# Patient Record
Sex: Female | Born: 1935 | Race: White | Hispanic: No | Smoking: Former smoker
Health system: Southern US, Community
[De-identification: ages and names within clinical notes are randomized; demographics above are authoritative.]

## PROBLEM LIST (undated history)

## (undated) ENCOUNTER — Ambulatory Visit: Admission: EM | Source: Home / Self Care

## (undated) DIAGNOSIS — M1990 Unspecified osteoarthritis, unspecified site: Secondary | ICD-10-CM

## (undated) DIAGNOSIS — R0602 Shortness of breath: Secondary | ICD-10-CM

## (undated) DIAGNOSIS — H9190 Unspecified hearing loss, unspecified ear: Secondary | ICD-10-CM

## (undated) DIAGNOSIS — R112 Nausea with vomiting, unspecified: Secondary | ICD-10-CM

## (undated) DIAGNOSIS — N289 Disorder of kidney and ureter, unspecified: Secondary | ICD-10-CM

## (undated) DIAGNOSIS — C50912 Malignant neoplasm of unspecified site of left female breast: Principal | ICD-10-CM

## (undated) DIAGNOSIS — I10 Essential (primary) hypertension: Secondary | ICD-10-CM

## (undated) DIAGNOSIS — K219 Gastro-esophageal reflux disease without esophagitis: Secondary | ICD-10-CM

## (undated) DIAGNOSIS — Z923 Personal history of irradiation: Secondary | ICD-10-CM

## (undated) DIAGNOSIS — Z973 Presence of spectacles and contact lenses: Secondary | ICD-10-CM

## (undated) DIAGNOSIS — E785 Hyperlipidemia, unspecified: Secondary | ICD-10-CM

## (undated) DIAGNOSIS — K5792 Diverticulitis of intestine, part unspecified, without perforation or abscess without bleeding: Secondary | ICD-10-CM

## (undated) DIAGNOSIS — K08109 Complete loss of teeth, unspecified cause, unspecified class: Secondary | ICD-10-CM

## (undated) DIAGNOSIS — IMO0001 Reserved for inherently not codable concepts without codable children: Secondary | ICD-10-CM

## (undated) DIAGNOSIS — T7840XA Allergy, unspecified, initial encounter: Secondary | ICD-10-CM

## (undated) DIAGNOSIS — Z972 Presence of dental prosthetic device (complete) (partial): Secondary | ICD-10-CM

## (undated) DIAGNOSIS — M1712 Unilateral primary osteoarthritis, left knee: Secondary | ICD-10-CM

## (undated) DIAGNOSIS — Z9889 Other specified postprocedural states: Secondary | ICD-10-CM

## (undated) HISTORY — PX: TONSILLECTOMY: SUR1361

## (undated) HISTORY — DX: Disorder of kidney and ureter, unspecified: N28.9

## (undated) HISTORY — DX: Malignant neoplasm of unspecified site of left female breast: C50.912

## (undated) HISTORY — PX: BLADDER SUSPENSION: SHX72

## (undated) HISTORY — PX: TUBAL LIGATION: SHX77

## (undated) HISTORY — PX: FRACTURE SURGERY: SHX138

## (undated) HISTORY — PX: CHOLECYSTECTOMY: SHX55

## (undated) HISTORY — DX: Personal history of irradiation: Z92.3

## (undated) HISTORY — PX: EYE SURGERY: SHX253

## (undated) HISTORY — DX: Allergy, unspecified, initial encounter: T78.40XA

---

## 1991-05-04 HISTORY — PX: ABDOMINAL HYSTERECTOMY: SHX81

## 1998-05-03 HISTORY — PX: KNEE SURGERY: SHX244

## 1998-05-22 ENCOUNTER — Encounter: Payer: Self-pay | Admitting: Emergency Medicine

## 1998-05-22 ENCOUNTER — Encounter: Payer: Self-pay | Admitting: Orthopedic Surgery

## 1998-05-22 ENCOUNTER — Inpatient Hospital Stay (HOSPITAL_COMMUNITY): Admission: EM | Admit: 1998-05-22 | Discharge: 1998-05-24 | Payer: Self-pay | Admitting: Emergency Medicine

## 1999-01-19 ENCOUNTER — Encounter: Payer: Self-pay | Admitting: Orthopedic Surgery

## 1999-01-20 ENCOUNTER — Encounter: Payer: Self-pay | Admitting: Orthopedic Surgery

## 1999-01-20 ENCOUNTER — Ambulatory Visit (HOSPITAL_COMMUNITY): Admission: RE | Admit: 1999-01-20 | Discharge: 1999-01-20 | Payer: Self-pay | Admitting: Orthopedic Surgery

## 1999-02-23 ENCOUNTER — Emergency Department (HOSPITAL_COMMUNITY): Admission: EM | Admit: 1999-02-23 | Discharge: 1999-02-23 | Payer: Self-pay | Admitting: Emergency Medicine

## 2000-10-31 ENCOUNTER — Other Ambulatory Visit: Admission: RE | Admit: 2000-10-31 | Discharge: 2000-10-31 | Payer: Self-pay | Admitting: Obstetrics and Gynecology

## 2000-11-07 ENCOUNTER — Ambulatory Visit (HOSPITAL_COMMUNITY): Admission: RE | Admit: 2000-11-07 | Discharge: 2000-11-07 | Payer: Self-pay | Admitting: Gastroenterology

## 2000-11-15 ENCOUNTER — Encounter: Payer: Self-pay | Admitting: Obstetrics and Gynecology

## 2000-11-15 ENCOUNTER — Ambulatory Visit (HOSPITAL_COMMUNITY): Admission: RE | Admit: 2000-11-15 | Discharge: 2000-11-15 | Payer: Self-pay | Admitting: Obstetrics and Gynecology

## 2001-09-18 ENCOUNTER — Encounter: Payer: Self-pay | Admitting: Family Medicine

## 2001-09-18 ENCOUNTER — Encounter: Admission: RE | Admit: 2001-09-18 | Discharge: 2001-09-18 | Payer: Self-pay | Admitting: Family Medicine

## 2001-11-16 ENCOUNTER — Ambulatory Visit (HOSPITAL_COMMUNITY): Admission: RE | Admit: 2001-11-16 | Discharge: 2001-11-16 | Payer: Self-pay | Admitting: Obstetrics and Gynecology

## 2001-11-16 ENCOUNTER — Encounter: Payer: Self-pay | Admitting: Obstetrics and Gynecology

## 2001-11-23 ENCOUNTER — Other Ambulatory Visit: Admission: RE | Admit: 2001-11-23 | Discharge: 2001-11-23 | Payer: Self-pay | Admitting: Obstetrics and Gynecology

## 2002-11-29 ENCOUNTER — Other Ambulatory Visit: Admission: RE | Admit: 2002-11-29 | Discharge: 2002-11-29 | Payer: Self-pay | Admitting: *Deleted

## 2002-12-11 ENCOUNTER — Encounter: Payer: Self-pay | Admitting: Family Medicine

## 2002-12-11 ENCOUNTER — Ambulatory Visit (HOSPITAL_COMMUNITY): Admission: RE | Admit: 2002-12-11 | Discharge: 2002-12-11 | Payer: Self-pay | Admitting: Family Medicine

## 2002-12-29 ENCOUNTER — Encounter: Payer: Self-pay | Admitting: Emergency Medicine

## 2002-12-29 ENCOUNTER — Emergency Department (HOSPITAL_COMMUNITY): Admission: EM | Admit: 2002-12-29 | Discharge: 2002-12-29 | Payer: Self-pay | Admitting: Emergency Medicine

## 2003-01-12 ENCOUNTER — Encounter: Payer: Self-pay | Admitting: Family Medicine

## 2003-01-12 ENCOUNTER — Ambulatory Visit (HOSPITAL_COMMUNITY): Admission: RE | Admit: 2003-01-12 | Discharge: 2003-01-12 | Payer: Self-pay | Admitting: Family Medicine

## 2003-04-10 ENCOUNTER — Ambulatory Visit (HOSPITAL_COMMUNITY): Admission: RE | Admit: 2003-04-10 | Discharge: 2003-04-10 | Payer: Self-pay | Admitting: Family Medicine

## 2003-12-17 ENCOUNTER — Ambulatory Visit (HOSPITAL_COMMUNITY): Admission: RE | Admit: 2003-12-17 | Discharge: 2003-12-17 | Payer: Self-pay | Admitting: Family Medicine

## 2003-12-18 ENCOUNTER — Other Ambulatory Visit: Admission: RE | Admit: 2003-12-18 | Discharge: 2003-12-18 | Payer: Self-pay | Admitting: Obstetrics and Gynecology

## 2004-03-12 ENCOUNTER — Ambulatory Visit (HOSPITAL_COMMUNITY): Admission: RE | Admit: 2004-03-12 | Discharge: 2004-03-12 | Payer: Self-pay | Admitting: Family Medicine

## 2004-09-22 ENCOUNTER — Encounter: Admission: RE | Admit: 2004-09-22 | Discharge: 2004-10-06 | Payer: Self-pay | Admitting: Orthopedic Surgery

## 2005-01-27 ENCOUNTER — Encounter: Admission: RE | Admit: 2005-01-27 | Discharge: 2005-01-27 | Payer: Self-pay | Admitting: Surgery

## 2005-02-09 ENCOUNTER — Ambulatory Visit (HOSPITAL_COMMUNITY): Admission: RE | Admit: 2005-02-09 | Discharge: 2005-02-09 | Payer: Self-pay | Admitting: Family Medicine

## 2005-03-10 ENCOUNTER — Encounter (INDEPENDENT_AMBULATORY_CARE_PROVIDER_SITE_OTHER): Payer: Self-pay | Admitting: Specialist

## 2005-03-10 ENCOUNTER — Ambulatory Visit (HOSPITAL_COMMUNITY): Admission: RE | Admit: 2005-03-10 | Discharge: 2005-03-10 | Payer: Self-pay | Admitting: Surgery

## 2006-02-08 ENCOUNTER — Other Ambulatory Visit: Admission: RE | Admit: 2006-02-08 | Discharge: 2006-02-08 | Payer: Self-pay | Admitting: Obstetrics and Gynecology

## 2006-02-15 ENCOUNTER — Ambulatory Visit (HOSPITAL_COMMUNITY): Admission: RE | Admit: 2006-02-15 | Discharge: 2006-02-15 | Payer: Self-pay | Admitting: *Deleted

## 2006-09-11 ENCOUNTER — Observation Stay (HOSPITAL_COMMUNITY): Admission: EM | Admit: 2006-09-11 | Discharge: 2006-09-14 | Payer: Self-pay | Admitting: Emergency Medicine

## 2007-06-01 ENCOUNTER — Ambulatory Visit (HOSPITAL_COMMUNITY): Admission: RE | Admit: 2007-06-01 | Discharge: 2007-06-01 | Payer: Self-pay | Admitting: Family Medicine

## 2010-05-24 ENCOUNTER — Encounter: Payer: Self-pay | Admitting: Family Medicine

## 2010-09-15 NOTE — H&P (Signed)
NAME:  Melissa Morton, Melissa Morton NO.:  [id number]   MEDICAL RECORD NO.:  [id number]          PATIENT TYPE:  EMS   LOCATION:  MAJO                         FACILITY:  [facility]   PHYSICIAN:  Barnetta Chapel, MDDATE OF BIRTH:  03/15/1934   DATE OF ADMISSION:  09/11/2006  DATE OF DISCHARGE:                              HISTORY & PHYSICAL   PRIMARY CARE PHYSICIAN:  __________   PRESENTING PROBLEM:  Chest pain.   HISTORY OF PRESENTING COMPLAINT:  The patient is a 77 year old female  with prior medical history, significant for hypertension, GERD, status  post cholecystectomy and diverticulosis.  According to the patient, she  woke up early this morning to use the bathroom and felt a bit  lightheaded.  She felt as if she was going to pass out.  Subsequently  she developed chest pain.  The pain involved lower rib cage area  anteriorly (beneath the breast).  The pain seems quite atypical.  There  is associated nausea and shortness of breath.  No diaphoresis.  The pain  had never had chest pain in the past.  She does not have a cardiologist.  Family history of myocardial infarction.  She does not smoke cigarettes.  She has a history of dyslipidemia.   PAST MEDICAL HISTORY:  Includes:  1. Hypertension.  2. Dyslipidemia.  3. Gastroesophageal reflux disease.  4. Diverticulosis.  5. Status post laparoscopic cholecystectomy.   MEDICATIONS PRIOR TO ADMISSION:  1. Cardizem.  2. Fosamax 70 mg once weekly (every Tuesday).  3. Lipitor 20 mg p.o. once daily.  4. Prevacid 30 mg p.o. once daily.   ALLERGIES:  None.   SOCIAL HISTORY:  The patient is widowed.  The husband died 12 years ago.  She has 6 children.  She lives with one of the daughters.  The patient  does not smoke cigarettes, does not drink alcohol nor use illicit drugs.   FAMILY HISTORY:  The patient's younger sister had a MI at the age of 53.  The patient's father died from heart disease when she was very young.   REVIEW OF SYSTEMS:  There is no headache, no neck pain, no fever or  chills, no sore throat, no change in bowel habits, no vomiting.  There  is no dysuria, urinary frequency or urgency.  No weight loss.  No joint  pains.   PHYSICAL EXAMINATION:  VITAL SIGNS:  The temperature is 98.6, the blood  pressure is 130/63 mmHg, the heart rate is 67 per minute and the  respiratory rate is 18.  Oxygen saturations are 93% on room air.  GENERAL:  The patient is very comfortable.  She is not in any  respiratory distress.  HEENT:  There is no pallor, no jaundice, extraocular muscles are intact.  The tongue is dry.  NECK:  Supple.  There is there no lymphadenopathy or JVD.  LUNGS:  Clear to auscultation.  CVS:  S1, S2, no heart murmur appreciated.  ABDOMEN:  Soft and nontender.  Bowel sounds are present.  Organs are  difficult to assess.  NEURO:  Nonfocal.  The patient moves all limbs.  EXTREMITIES:  No edema.   LABORATORY DATA:  Done so far, sodium is 138, potassium is 4, chloride  is 106, BUN is 27, creatinine is 1.7.  hemoglobin is 11.9.  The CK is  less than 1.  The troponin is less than 0.05.  INR is 1.  D-dimer is  less than 0.22.  The chest x-ray is read as no acute abnormalities.   IMPRESSION:  1. Chest pain.  This sounds quite atypical.  The patient has a history      of gastroesophageal reflux disease.  However, there is a positive      family history of myocardial infarction.  She also has a history of      hypertension and dyslipidemia.  She is a 77 year old lady.  Will      admit the patient.  Will get a cardiology consult.  May need to do      a cardiac stress test on this patient.  Will also get a CT scan to      rule out PE.  2. Acute renal failure, suspect acute.  The patient has a history of      high blood pressure.  I am not sure how well controlled the      patient's blood pressure has been.  She may also be mildly      dehydrated.  We will rehydrate the patient.  We will  check urine      electrolytes.  Will also get a renal ultrasound.  Further      management will depend on that.  3. History of hypertension.  This seems well controlled for now.  Will      continue Cardizem.  4. History of gastroesophageal reflux disease.  Will use Protonix      twice daily.  Further management will depend on hospital course.      Barnetta Chapel, MD  Electronically Signed     SIO/MEDQ  D:  09/11/2006  T:  09/11/2006  Job:  [id number]

## 2010-09-15 NOTE — Consult Note (Signed)
NAME:  Melissa Morton, BOCHICCHIO NO.:  [id number]   MEDICAL RECORD NO.:  [id number]          PATIENT TYPE:  INP   LOCATION:  2011                         FACILITY:  [facility]   PHYSICIAN:  Corky Crafts, MDDATE OF BIRTH:  [birth year]   DATE OF CONSULTATION:  DATE OF DISCHARGE:                                 CONSULTATION   PRIMARY CARE PHYSICIAN:  Royetta Crochet, MD   REASON FOR CONSULTATION:  Chest pain.   HISTORY OF PRESENT ILLNESS:  The patient is a 77 year old woman with  hypertension.  She woke up about at about 3:30 this morning to go the  bathroom.  She felt dizzy and her hands were tingling.  She then sat  down and became short of breath when trying to lie flat.  She felt a  tightness around her lower chest, it felt like a band.  This sensation  she has had in the past many times.  It has been occurring about one to  two times per week for many years.  She has never had any prior cardiac  evaluation.  She walks for exercise.  She has never had the band  sensation while walking.  There are no discernible triggers for this  feeling.   PAST MEDICAL HISTORY:  1. Hypertension.  2. GERD.  3. Diverticulosis.   PAST SURGICAL HISTORY:  Cholecystectomy.   ALLERGIES:  NO KNOWN DRUG ALLERGIES.   MEDICATIONS AT HOME:  1. Cardizem.  2. Fosamax.  3. Lipitor 20 mg a day.  4. Presence 30 mg a day.   SOCIAL HISTORY:  The patient does not smoke or drink.  She lives with  one of her daughters.  She is widowed and six children.   FAMILY HISTORY:  She had a sister who has congestive heart failure at  age 66.  Father died at young age.   REVIEW OF SYSTEMS:  No recent fevers or chills.  No weight loss.  No  nausea or vomiting.  No focal weakness currently.  No rash.  All other  systems negative.   PHYSICAL EXAMINATION:  VITAL SIGNS:  Blood pressure is 116/68, pulse 71.  GENERAL:  Patient is awake and alert in no apparent distress.  HEAD:  Normocephalic, atraumatic.  EYES:  Extraocular movements intact.  NECK:  No carotid bruits.  CARDIOVASCULAR:  Regular rate and rhythm.  S1, S2.  LUNGS:  Clear to auscultation bilaterally.  ABDOMEN:  Soft, nontender, and nondistended.  EXTREMITIES:  Showed no edema.  NEURO:  No focal deficits.  SKIN:  No rash.  PSYCH:  Normal mood and affect.   EKG shows normal sinus rhythm with no ST-T wave changes.  There are  occasional PVCs.  Lab work shows hematocrit of 34.0, creatinine 1.45  which is down from 1.7.   ASSESSMENT/PLAN:  A 77 year old with chest pain and several risk factors  for heart disease.   PLAN:  1. Cardiac.  We will out for MI with enzymes.  So far, her troponin      has been negative.  2. She will also be worked up for her  elevated creatinine and possibly      for a PE.  3. Continue Cardizem for blood pressure control.  Blood pressure is      currently well-controlled.  4. We will try to obtain a prior outpatient fasting lipid profile.  5. The patient will likely need a stress test since she will be worked      up for other issues, such as her renal failure and possibly a PE.      I will consider doing this as an outpatient if she rules out for      MI.      Corky Crafts, MD  Electronically Signed     JSV/MEDQ  D:  09/11/2006  T:  09/12/2006  Job:  [id number]

## 2010-09-18 NOTE — Procedures (Signed)
[place]  Patient:    Melissa Morton, Melissa Morton                      MRN: [id number] Proc. Date: 11/07/00 Adm. Date:  [id number] Attending:  Nelda Marseille CC:         Meredith Staggers, M.D.   Procedure Report  PROCEDURE PERFORMED:  Colonoscopy with biopsy.  ENDOSCOPIST:  Petra Kuba, M.D.  INDICATIONS FOR PROCEDURE:  Patient with a history of colon polyps due for repeat screening.  Consent was signed after risks, benefits, methods, and options were thoroughly discussed multiple times in the past.  MEDICATIONS USED:  Demerol 50 mg, Versed 6 mg.  DESCRIPTION OF PROCEDURE:  Rectal inspection was pertinent for external hemorrhoids.  Digital exam was negative.  Video pediatric colonoscope was inserted and with moderate difficulty due to a tortuous colon, advanced to the cecum.  This did require rolling her on her back, abdominal pressure and then rolling her on her right side.  The cecum was identified by the appendiceal orifice and the ileocecal valve.  Other than some left-sided diverticula, no other abnormalities were seen on insertion.  The scope was slowly withdrawn. Prep was adequate.  She did have some liquid stool that washing and suctioning frequently.  On slow withdrawal through the colon, the cecum, ascending and transverse were all normal.  As the scope was withdrawn around the left side of the colon occasional diverticula were seen.  In the sigmoid was some wall edema, tortuosity and two linear ulcers with a mild amount of erythema.  Both of which were cold biopsies.  No polypoid lesions or masses were seen as we slowly withdrew back to the rectum.  Once back in the rectum, the scope was then retroflexed revealing some internal hemorrhoids.  The scope was straightened and readvanced a short ways around the left-sided colon.  Air was suctioned, scope removed.  The patient tolerated the procedure well.  There was no obvious immediate  complication.  ENDOSCOPIC DIAGNOSIS: 1. Internal and external hemorrhoids. 2. Occasional left diverticula. 3. Two small sigmoid ulcerations and erythema, status post biopsy. 4. Otherwise within normal limits to the cecum.  PLAN:  Will await pathology.  Will probably recheck colon screening in five years.  Happy to see back p.r.n.  Otherwise return care to Dr. Dayton Scrape for the customary health care maintenance to include yearly rectals and guaiacs. DD:  11/07/00 TD:  11/07/00 Job: [id number]

## 2010-09-18 NOTE — Op Note (Signed)
NAME:  Melissa Morton, Melissa Morton               ACCOUNT NO.:  [id number]   MEDICAL RECORD NO.:  [id number]          PATIENT TYPE:  AMB   LOCATION:  DAY                          FACILITY:  [facility]   PHYSICIAN:  Thomas A. Cornett, M.D.DATE OF BIRTH:  1934/01/20   DATE OF PROCEDURE:  03/10/2005  DATE OF DISCHARGE:                                 OPERATIVE REPORT   PREOPERATIVE DIAGNOSIS:  Symptomatic cholelithiasis.   POSTOPERATIVE DIAGNOSIS:  Symptomatic cholelithiasis.   PROCEDURE:  Laparoscopic cholecystectomy with intraoperative cholangiogram.   SURGEON:  Thomas A. Cornett, M.D.   ESTIMATED BLOOD LOSS:  10 mL.   SPECIMEN:  Gallbladder with large gallstones to pathology.   FINDINGS:  Intraoperative cholangiogram reveals no evidence of common bile  duct stone or injury. There is free flow of contrast into the duodenum and  up into the common hepatic duct and bifurcation to right and left hepatic  ducts.   INDICATIONS FOR PROCEDURE:  The patient is a 77 year old female who has had  symptomatic cholelithiasis for a number of months.  She has had progressive  pain and worsening of symptoms.  After discussion of treatment options she  elected to proceed with laparoscopic cholecystectomy and intraoperative  cholangiogram.  All risks were explained to the patient.  She voiced her  understanding and agreed to proceed.   DESCRIPTION OF PROCEDURE:  The patient was brought to the operating room and  placed supine. After induction of general endotracheal anesthesia, her  abdomen was prepped and draped in a sterile fashion. A 1-cm supraumbilical  incision was made.  Dissection was carried down to her fascia, and her  fascia was grasped with a Kocher.  A small incision was made in the fascia,  and both edges were then grasped with Kochers.  I placed a Kelly into the  preperitoneal fat and pushed it through the peritoneal lining into the  peritoneum. I placed my finger and swept it around and felt no  obvious  adhesions.  A pursestring suture of 0 Vicryl was placed, and an 11 mL Hassan  cannula was placed under direct vision. Pneumoperitoneum was then created to  15 mmHg of CO2.  Laparoscope was placed.  The patient was placed in reverse  Trendelenburg and rolled to her left.  Laparoscopy revealed some adhesions  just below the umbilicus.  There is no evidence of any or solid organ injury  at the insertion of the Hasson.  A 5 mm subxiphoid port was placed in the  subxiphoid position.  Two of the 5 mm ports were placed, both in the right  mid abdomen. The gallbladder was identified, grasped by its dome and  retracted toward the patient's right shoulder.  A second grasper was used to  grab the infundibulum of the gallbladder and retract it toward the patient's  right lower quadrant.  This opened the triangle of Calot up for dissection.  I scored the peritoneum overlying the junction of the cystic duct and  infundibulum and pushed the free tissue away.  I was able to dissect out the  cystic duct circumferentially.  This was the  only tubular structure entering  the gallbladder.  A clip was placed on the gallbladder side of this.  A  small incision was made in the cystic duct, and a Cook catheter was  introduced in the right upper quadrant through a separate stab incision.  The [facility] catheter was placed and secured with the clip.  Intraoperative  cholangiogram using half-strength Hypaque dye was done.  There was flow of  the cystic duct into the common duct and out to the duodenum.  I did not see  any obvious filling defects to the duct into the common hepatic duct which  bifurcated to the right and left ducts.  At this point in time, the  cholangiogram catheter was removed and was placed in the cystic duct stump  x3. The cystic artery was dissected out circumferentially. This was double  clipped and divided. There was a small posterior branch that we also clipped  and divided.  This area  appeared hemostatic.  The clips all appeared in  placed. The gallbladder was dissected away from the gallbladder bed using  electrocautery.  This was done until the dome was reached.  Using a 5-mm  scope for visualization, the gallbladder was extracted to the umbilicus with  an EndoCatch bag and passed off.  No evidence of bleeding or bile leakage.  The clips were on the cystic duct and cystic artery without any evidence of  leakage. There is no evidence of any bowel or liver _injury.  All of the  sheaths were removed under direct vision with no evidence of port site  bleeding.  Prior this, we irrigated and suctioned out all free bile and  blood until clear.  Once all if the ports were removed, I removed the camera  and released the CO2 through the umbilical Hasson.  I removed the Hasson and  closed the fascia with 0 Vicryl. The 4-0 Monocryl was used to close all skin  incisions.  All sponge, needle and instrument counts were counted, and found  to be correct in this portion of the case.  Sterile dressings were applied.  The patient was awoke, taken to recovery.      Thomas A. Cornett, M.D.  Electronically Signed     TAC/MEDQ  D:  03/10/2005  T:  03/10/2005  Job:  [id number]   cc:   Tally Joe, M.D.  Fax: [phone]

## 2010-12-07 ENCOUNTER — Inpatient Hospital Stay (HOSPITAL_COMMUNITY)
Admission: EM | Admit: 2010-12-07 | Discharge: 2010-12-10 | DRG: 392 | Disposition: A | Discharge status: Home / Self Care | Payer: Medicare Other | Attending: Family Medicine | Admitting: Family Medicine

## 2010-12-07 ENCOUNTER — Emergency Department (HOSPITAL_COMMUNITY): Payer: Medicare Other

## 2010-12-07 DIAGNOSIS — I129 Hypertensive chronic kidney disease with stage 1 through stage 4 chronic kidney disease, or unspecified chronic kidney disease: Secondary | ICD-10-CM | POA: Diagnosis present

## 2010-12-07 DIAGNOSIS — K5732 Diverticulitis of large intestine without perforation or abscess without bleeding: Principal | ICD-10-CM | POA: Diagnosis present

## 2010-12-07 DIAGNOSIS — R82998 Other abnormal findings in urine: Secondary | ICD-10-CM | POA: Diagnosis present

## 2010-12-07 DIAGNOSIS — N183 Chronic kidney disease, stage 3 unspecified: Secondary | ICD-10-CM | POA: Diagnosis present

## 2010-12-07 DIAGNOSIS — K219 Gastro-esophageal reflux disease without esophagitis: Secondary | ICD-10-CM | POA: Diagnosis present

## 2010-12-07 DIAGNOSIS — D649 Anemia, unspecified: Secondary | ICD-10-CM | POA: Diagnosis present

## 2010-12-07 DIAGNOSIS — E785 Hyperlipidemia, unspecified: Secondary | ICD-10-CM | POA: Diagnosis present

## 2010-12-07 LAB — COMPREHENSIVE METABOLIC PANEL
ALT: 13 U/L (ref 0–35)
AST: 16 U/L (ref 0–37)
Albumin: 3.4 g/dL — ABNORMAL LOW (ref 3.5–5.2)
Alkaline Phosphatase: 116 U/L (ref 39–117)
BUN: 28 mg/dL — ABNORMAL HIGH (ref 6–23)
CO2: 27 mEq/L (ref 19–32)
Calcium: 9.3 mg/dL (ref 8.4–10.5)
Chloride: 94 mEq/L — ABNORMAL LOW (ref 96–112)
Creatinine, Ser: 1.86 mg/dL — ABNORMAL HIGH (ref 0.50–1.10)
GFR calc Af Amer: 32 mL/min — ABNORMAL LOW (ref >60)
GFR calc non Af Amer: 26 mL/min — ABNORMAL LOW (ref >60)
Glucose, Bld: 104 mg/dL — ABNORMAL HIGH (ref 70–99)
Potassium: 3.4 mEq/L — ABNORMAL LOW (ref 3.5–5.1)
Sodium: 132 mEq/L — ABNORMAL LOW (ref 135–145)
Total Bilirubin: 0.6 mg/dL (ref 0.3–1.2)
Total Protein: 7.4 g/dL (ref 6.0–8.3)

## 2010-12-07 LAB — URINALYSIS, ROUTINE W REFLEX MICROSCOPIC
Bilirubin Urine: NEGATIVE
Glucose, UA: NEGATIVE mg/dL
Hgb urine dipstick: NEGATIVE
Ketones, ur: NEGATIVE mg/dL
Nitrite: NEGATIVE
Protein, ur: NEGATIVE mg/dL
Specific Gravity, Urine: 1.018 (ref 1.005–1.030)
Urobilinogen, UA: 0.2 mg/dL (ref 0.0–1.0)
pH: 5.5 (ref 5.0–8.0)

## 2010-12-07 LAB — DIFFERENTIAL
Basophils Absolute: 0 10*3/uL (ref 0.0–0.1)
Basophils Relative: 0 % (ref 0–1)
Eosinophils Absolute: 0 10*3/uL (ref 0.0–0.7)
Eosinophils Relative: 0 % (ref 0–5)
Lymphocytes Relative: 10 % — ABNORMAL LOW (ref 12–46)
Lymphs Abs: 2.1 10*3/uL (ref 0.7–4.0)
Monocytes Absolute: 1.7 10*3/uL — ABNORMAL HIGH (ref 0.1–1.0)
Monocytes Relative: 8 % (ref 3–12)
Neutro Abs: 17.1 10*3/uL — ABNORMAL HIGH (ref 1.7–7.7)
Neutrophils Relative %: 82 % — ABNORMAL HIGH (ref 43–77)

## 2010-12-07 LAB — CBC
HCT: 36.6 % (ref 36.0–46.0)
Hemoglobin: 11.8 g/dL — ABNORMAL LOW (ref 12.0–15.0)
MCH: 29.6 pg (ref 26.0–34.0)
MCHC: 32.2 g/dL (ref 30.0–36.0)
MCV: 92 fL (ref 78.0–100.0)
Platelets: 303 10*3/uL (ref 150–400)
RBC: 3.98 MIL/uL (ref 3.87–5.11)
RDW: 14.9 % (ref 11.5–15.5)
WBC: 20.9 10*3/uL — ABNORMAL HIGH (ref 4.0–10.5)

## 2010-12-07 LAB — URINE MICROSCOPIC-ADD ON

## 2010-12-08 ENCOUNTER — Encounter (HOSPITAL_COMMUNITY): Payer: Self-pay

## 2010-12-08 LAB — BASIC METABOLIC PANEL
BUN: 25 mg/dL — ABNORMAL HIGH (ref 6–23)
CO2: 25 mEq/L (ref 19–32)
Calcium: 8 mg/dL — ABNORMAL LOW (ref 8.4–10.5)
Creatinine, Ser: 1.69 mg/dL — ABNORMAL HIGH (ref 0.50–1.10)
GFR calc non Af Amer: 30 mL/min — ABNORMAL LOW (ref >60)
Glucose, Bld: 97 mg/dL (ref 70–99)

## 2010-12-08 LAB — CBC
HCT: 30.7 % — ABNORMAL LOW (ref 36.0–46.0)
Hemoglobin: 10 g/dL — ABNORMAL LOW (ref 12.0–15.0)
MCH: 30 pg (ref 26.0–34.0)
MCHC: 32.6 g/dL (ref 30.0–36.0)
MCV: 92.2 fL (ref 78.0–100.0)
RBC: 3.33 MIL/uL — ABNORMAL LOW (ref 3.87–5.11)

## 2010-12-08 NOTE — H&P (Signed)
NAME:  Melissa Morton, Melissa Morton NO.:  [id number]  MEDICAL RECORD NO.:  [id number]  LOCATION:  WLED                         FACILITY:  [facility]  PHYSICIAN:  Gery Pray, MD      DATE OF BIRTH:  December 31, 1933  DATE OF ADMISSION:  12/07/2010 DATE OF DISCHARGE:                             HISTORY & PHYSICAL   PRIMARY CARE PHYSICIAN:  Noberto Retort, MD, with Deboraha Sprang.  GASTROENTEROLOGIST:  Petra Kuba, MD  Team #3.  CODE STATUS:  Full code.  CHIEF COMPLAINT:  Left lower quadrant pain.  The patient goes to team 3.  HISTORY OF PRESENT ILLNESS:  This is a pleasant 77 year old female who has a known history of diverticulosis with occasional recurrent diverticulitis that has been treated outpatient.  She had a colonoscopy 3-4 years ago which did show diverticulosis.  Approximately 2 weeks ago, she had an episode of recurrence of her diverticulitis.  She was treated outpatient with Cipro and Flagyl, completed antibiotic course. Her pain went away.  Three days ago, she again developed worsening left lower quadrant pain and she diarrhea.  No evidence of blood. She has some nausea and vomiting.  No hematemesis.  She felt weak.  She developed temperature up to 102.  She has not been able to eat.  She has not been able to keep liquids down.  She went and saw her PCP who sent her here. She was sent because her abdomen was little bit more tender than usual and she had some mild rebound.  The patient states she feels dehydrated.  She is feeling weak.  History obtained from the patient who is alert and oriented.  Family is at bedside.  PAST MEDICAL HISTORY: 1. Hypertension. 2. Dyslipidemia. 3. GERD. 4. Diverticulosis with recurrent diverticulitis.  PAST SURGICAL HISTORY:  Cholecystectomy and hysterectomy.  MEDICATIONS: 1. ProAir. 2. Astepro. 3. Fosamax weekly. 4. Allopurinol 300 mg tablet daily. 5. Lasix 40 mg daily. 6. Losartan 100 mg daily. 7. Colchicine 0.6 mg tablets  twice daily p.r.n. 8. Aspirin 81 mg daily. 9. Calcium with D. 10.Vesicare 10 mg daily. 11.Protonix 40 mg daily. 12.Simvastatin 40 mg daily.  ALLERGIES:  No known drug allergies.  SOCIAL HISTORY:  Negative for tobacco, alcohol, or illicit drugs.  The patient does not use home oxygen.  She does not use a walker.  She lives with her daughter.  FAMILY HISTORY:  Significant for diabetes mellitus, hypertension, breast cancer, and coronary artery disease.  REVIEW OF SYSTEMS:  All 10-point systems reviewed are negative except as noted in HPI.  PHYSICAL EXAMINATION:  VITAL SIGNS:  Blood pressure 133/93, pulse 93, respirations 20, temperature 98.6. GENERAL:  Alert and oriented female, currently in no acute distress. EYES:  Pink conjunctivae.  PERRLA. ENT:  Moist oral mucosa.  Trachea midline. NECK:  Supple. LUNGS:  Clear to auscultation bilaterally.  No use of accessory muscles. CARDIOVASCULAR:  Regular rate and rhythm without murmurs, rubs, or gallops.  No JVD. ABDOMEN:  Soft, decreased bowel sounds, positive tenderness to palpation greatest on the left lower quadrant.  There is a slight rebound, not acute surgical abdomen. NEUROLOGIC:  Cranial nerves II through XII grossly intact.  Sensation intact.  MUSCULOSKELETAL: Strength 5/5 in all extremities.  No clubbing, cyanosis, or edema. SKIN:  No rashes.  No subcutaneous crepitation.  LABORATORY DATA:  CT abdomen and pelvis, uncomplicated mid sigmoid diverticulitis.  UA, nitrites negative, leukocyte esterase small, bacteria many. White blood count 3-6.  Sodium 132, potassium 3.4, chloride 94, CO2 of 27, glucose 104, BUN 28, creatinine is elevated at 1.86.  LFTs are negative.  White blood count 20.9, hemoglobin 11.4, platelets 0-3.  The patient's baseline creatinine is 1.7.  ASSESSMENT AND PLAN: 1. Acute diverticulitis.  The patient will be admitted.  We will start     the patient on IV antibiotics, Cipro, and Flagyl.  We will keep  the     patient n.p.o., start IV fluids.  We will write some p.r.n. pain     medication.  Zofran will be ordered p.r.n. for nausea and vomiting.     Doubtful the patient has Clostridium difficile, but with diarrhea,     we will go ahead and order a Clostridium difficile toxin. 2. Chronic kidney disease stage 2.  We will give the patient IV fluid     hydration and monitor. 3. Hypertension. 4. Dyslipidemia. 5. Gastroesophageal reflux disease.  We will resume home medications.          ______________________________ Gery Pray, MD     DC/MEDQ  D:  12/08/2010  T:  12/08/2010  Job:  [id number]  Electronically Signed by Gery Pray MD on 12/08/2010 04:24:57 AM

## 2010-12-09 LAB — CBC
MCH: 30.3 pg (ref 26.0–34.0)
MCHC: 32.9 g/dL (ref 30.0–36.0)
MCV: 92.2 fL (ref 78.0–100.0)
Platelets: 250 10*3/uL (ref 150–400)
RBC: 3.33 MIL/uL — ABNORMAL LOW (ref 3.87–5.11)

## 2010-12-09 LAB — BASIC METABOLIC PANEL
CO2: 23 mEq/L (ref 19–32)
Calcium: 7.8 mg/dL — ABNORMAL LOW (ref 8.4–10.5)
Creatinine, Ser: 1.25 mg/dL — ABNORMAL HIGH (ref 0.50–1.10)
GFR calc non Af Amer: 42 mL/min — ABNORMAL LOW (ref >60)
Sodium: 138 mEq/L (ref 135–145)

## 2010-12-10 LAB — BASIC METABOLIC PANEL
Calcium: 8.1 mg/dL — ABNORMAL LOW (ref 8.4–10.5)
Creatinine, Ser: 1.12 mg/dL — ABNORMAL HIGH (ref 0.50–1.10)
GFR calc Af Amer: 57 mL/min — ABNORMAL LOW (ref >60)
Sodium: 141 mEq/L (ref 135–145)

## 2010-12-10 LAB — CBC
MCH: 29.5 pg (ref 26.0–34.0)
MCV: 92.3 fL (ref 78.0–100.0)
Platelets: 278 10*3/uL (ref 150–400)
RBC: 3.36 MIL/uL — ABNORMAL LOW (ref 3.87–5.11)
RDW: 14.8 % (ref 11.5–15.5)
WBC: 8.5 10*3/uL (ref 4.0–10.5)

## 2010-12-11 LAB — URINE CULTURE: Special Requests: NEGATIVE

## 2010-12-24 NOTE — Discharge Summary (Signed)
NAME:  Melissa Morton, Melissa Morton               ACCOUNT NO.:  [id number]  MEDICAL RECORD NO.:  [id number]  LOCATION:  1528                         FACILITY:  [facility]  PHYSICIAN:  Kathlen Mody, MD       DATE OF BIRTH:  [birth year]  DATE OF ADMISSION:  12/07/2010 DATE OF DISCHARGE:  12/10/2010                              DISCHARGE SUMMARY   PRIMARY CARE PHYSICIAN:  Dr. Tiburcio Pea.  GASTROENTEROLOGIST:  Petra Kuba, M.D.  DISCHARGED DIAGNOSES: 1. Acute diverticulitis. 2. Stage 3 chronic kidney disease improved to a stage 2 chronic kidney     disease. 3. Hypertension. 4. Hyperlipidemia. 5. Gastroesophageal reflux disease. 6. Gout.  DISCHARGE MEDICATIONS: 1. Ciprofloxacin 250 mg twice daily for 11 more days. 2. Flagyl 500 mg 3 tablets a day for 11 more days to complete the     course of antibiotic. 3. Zofran 4 mg q.6 h. as needed. 4. Allopurinol 300 mg daily. 5. Aspirin 81 mg daily. 6. Calcium 1 tablet twice daily. 7. Colchicine 0.6 mg 1 tablet daily as needed. 8. Fosamax 70 mg 1 tablet weekly. 9. Lasix 40 mg daily as needed. 10.Losartan 100 mg daily as needed. 11.ProAir 4 times a day as needed. 12.Protonix 40 mg daily. 13.Simvastatin 40 mg daily. 14.VESIcare 10 mg 1 tablet daily.  CONSULTATION:  None.  PROCEDURES:  None.  PERTINENT LABORATORIES:  The patient came in with a white count of 20.9, platelets of 303, and hemoglobin of 11.8.  Comprehensive metabolic panel significant for sodium of 132, potassium of 3.4, creatinine of 1.86, BUN of 28, and glucose of 104.  Urinalysis showed small leukocytes with many bacteria.  Urine culture showed no growth.  On the day of discharge, the patient's white count has resolved to normal.  Hemoglobin is 9.9, platelets are normal.  Basic metabolic panel showed a creatinine of 1.12.  Rest of the electrolytes were within normal limits.  DIAGNOSTIC STUDIES:  The patient had a CT abdomen and pelvis without contrast showed uncomplicated midsigmoid  diverticulitis.  BRIEF ADMISSION HISTORY: 32. A 77 year old lady with history of diverticulosis with a     colonoscopy about 4 years ago came in for left lower quadrant     abdominal pain, was found to have a diverticulitis on the CAT scan.     She was started on Cipro and Flagyl as an outpatient and completed     the course for a week, but 3 days after completing the course of     antibiotics, her pain recurred associated with diarrhea.  At this     time, the patient is being admitted for recurrent diverticulitis.     This is her third episode at this time.  She was started on IV     antibiotics, IV Cipro, and Flagyl.  She was also recommended that     she needs to see Gastroenterology as an outpatient and schedule     another colonoscopy for possible colectomy as she has recurrent     diverticulitis.  She was also adequately hydrated with IV fluids,     antiemetics for nausea, vomiting.  She did not think that this is C     difficile  diarrhea as she had diverticulitis on the CAT scan. 2. Chronic kidney disease has improved.  Creatinine has improved from     1.69-1.12. 3. Hypertension, controlled. 4. Dyslipidemia, controlled. 5. GERD, continue with Protonix.  On the day of discharge, the patient's vitals were within normal limits. Her exam was within normal limits.  She was hemodynamically stable for discharge.  FOLLOWUP:  Follow up with her gastroenterologist in 1-2 weeks for possible repeat colonoscopy.          ______________________________ Kathlen Mody, MD     VA/MEDQ  D:  12/23/2010  T:  12/23/2010  Job:  [id number]  Electronically Signed by Kathlen Mody MD on 12/24/2010 03:27:46 PM

## 2011-02-02 ENCOUNTER — Other Ambulatory Visit: Payer: Self-pay | Admitting: Gastroenterology

## 2011-09-28 ENCOUNTER — Emergency Department (HOSPITAL_COMMUNITY): Payer: Medicare Other

## 2011-09-28 ENCOUNTER — Emergency Department (HOSPITAL_COMMUNITY)
Admission: EM | Admit: 2011-09-28 | Discharge: 2011-09-28 | Disposition: A | Discharge status: Home / Self Care | Payer: Medicare Other | Attending: Emergency Medicine | Admitting: Emergency Medicine

## 2011-09-28 ENCOUNTER — Encounter (HOSPITAL_COMMUNITY): Payer: Self-pay | Admitting: *Deleted

## 2011-09-28 DIAGNOSIS — K219 Gastro-esophageal reflux disease without esophagitis: Secondary | ICD-10-CM | POA: Insufficient documentation

## 2011-09-28 DIAGNOSIS — Z79899 Other long term (current) drug therapy: Secondary | ICD-10-CM | POA: Insufficient documentation

## 2011-09-28 DIAGNOSIS — R071 Chest pain on breathing: Secondary | ICD-10-CM | POA: Insufficient documentation

## 2011-09-28 DIAGNOSIS — I10 Essential (primary) hypertension: Secondary | ICD-10-CM | POA: Insufficient documentation

## 2011-09-28 DIAGNOSIS — R0789 Other chest pain: Secondary | ICD-10-CM

## 2011-09-28 DIAGNOSIS — R509 Fever, unspecified: Secondary | ICD-10-CM | POA: Insufficient documentation

## 2011-09-28 HISTORY — DX: Diverticulitis of intestine, part unspecified, without perforation or abscess without bleeding: K57.92

## 2011-09-28 HISTORY — DX: Reserved for inherently not codable concepts without codable children: IMO0001

## 2011-09-28 HISTORY — DX: Gastro-esophageal reflux disease without esophagitis: K21.9

## 2011-09-28 HISTORY — DX: Essential (primary) hypertension: I10

## 2011-09-28 LAB — POCT I-STAT TROPONIN I

## 2011-09-28 LAB — PROTIME-INR: INR: 1.14 (ref 0.00–1.49)

## 2011-09-28 LAB — COMPREHENSIVE METABOLIC PANEL
ALT: 12 U/L (ref 0–35)
Alkaline Phosphatase: 62 U/L (ref 39–117)
CO2: 27 mEq/L (ref 19–32)
Chloride: 100 mEq/L (ref 96–112)
GFR calc Af Amer: 41 mL/min — ABNORMAL LOW (ref >90)
Glucose, Bld: 141 mg/dL — ABNORMAL HIGH (ref 70–99)
Potassium: 3.2 mEq/L — ABNORMAL LOW (ref 3.5–5.1)
Sodium: 140 mEq/L (ref 135–145)
Total Bilirubin: 0.9 mg/dL (ref 0.3–1.2)
Total Protein: 6.4 g/dL (ref 6.0–8.3)

## 2011-09-28 LAB — CBC
Hemoglobin: 12.1 g/dL (ref 12.0–15.0)
Platelets: 242 10*3/uL (ref 150–400)
RBC: 3.99 MIL/uL (ref 3.87–5.11)
WBC: 15.9 10*3/uL — ABNORMAL HIGH (ref 4.0–10.5)

## 2011-09-28 LAB — PROCALCITONIN: Procalcitonin: <0.1 ng/mL

## 2011-09-28 LAB — DIFFERENTIAL
Eosinophils Absolute: 0 10*3/uL (ref 0.0–0.7)
Lymphocytes Relative: 10 % — ABNORMAL LOW (ref 12–46)
Lymphs Abs: 1.5 10*3/uL (ref 0.7–4.0)
Monocytes Relative: 9 % (ref 3–12)
Neutro Abs: 12.9 10*3/uL — ABNORMAL HIGH (ref 1.7–7.7)
Neutrophils Relative %: 82 % — ABNORMAL HIGH (ref 43–77)

## 2011-09-28 LAB — LACTIC ACID, PLASMA: Lactic Acid, Venous: 1.9 mmol/L (ref 0.5–2.2)

## 2011-09-28 MED ORDER — IOHEXOL 350 MG/ML SOLN
100.0000 mL | Freq: Once | INTRAVENOUS | Status: AC | PRN
  Administered 2011-09-28: 100 mL via INTRAVENOUS

## 2011-09-28 MED ORDER — ONDANSETRON HCL 4 MG/2ML IJ SOLN
4.0000 mg | Freq: Once | INTRAMUSCULAR | Status: AC
  Administered 2011-09-28: 4 mg via INTRAVENOUS

## 2011-09-28 MED ORDER — NAPROXEN 500 MG PO TABS: 500.0000 mg | ORAL_TABLET | Freq: Two times a day (BID) | ORAL | Status: DC

## 2011-09-28 MED ORDER — ONDANSETRON HCL 4 MG/2ML IJ SOLN
INTRAMUSCULAR | Status: AC
  Filled 2011-09-28: qty 2

## 2011-09-28 MED ORDER — SODIUM CHLORIDE 0.9 % IV BOLUS (SEPSIS)
1000.0000 mL | Freq: Once | INTRAVENOUS | Status: AC
  Administered 2011-09-28: 1000 mL via INTRAVENOUS

## 2011-09-28 MED ORDER — SODIUM CHLORIDE 0.9 % IV SOLN
INTRAVENOUS | Status: DC
  Administered 2011-09-28: 125 mL/h via INTRAVENOUS

## 2011-09-28 MED ORDER — HYDROMORPHONE HCL PF 1 MG/ML IJ SOLN
0.5000 mg | Freq: Once | INTRAMUSCULAR | Status: AC
  Administered 2011-09-28: 15:00:00 via INTRAVENOUS
  Filled 2011-09-28: qty 1

## 2011-09-28 MED ORDER — FENTANYL CITRATE 0.05 MG/ML IJ SOLN
50.0000 ug | Freq: Once | INTRAMUSCULAR | Status: AC
  Administered 2011-09-28: 50 ug via INTRAVENOUS
  Filled 2011-09-28: qty 2

## 2011-09-28 NOTE — ED Notes (Signed)
Patient complaining of 10/10 left sided chest pain, worse with movement, and inspiration. Dr. Fonnie Jarvis notified. New orders received. Patient medicated without incident. Will continue to monitor.

## 2011-09-28 NOTE — Discharge Instructions (Signed)
Chest Wall Pain Chest wall pain is pain in or around the bones and muscles of your chest. It may take up to 6 weeks to get better. It may take longer if you must stay physically active in your work and activities.  CAUSES  Chest wall pain may happen on its own. However, it may be caused by:  A viral illness like the flu.   Injury.   Coughing.   Exercise.   Arthritis.   Fibromyalgia.   Shingles.  HOME CARE INSTRUCTIONS   Avoid overtiring physical activity. Try not to strain or perform activities that cause pain. This includes any activities using your chest or your abdominal and side muscles, especially if heavy weights are used.   Put ice on the sore area.   Put ice in a plastic bag.   Place a towel between your skin and the bag.   Leave the ice on for 15 to 20 minutes per hour while awake for the first 2 days.   Only take over-the-counter or prescription medicines for pain, discomfort, or fever as directed by your caregiver.  SEEK IMMEDIATE MEDICAL CARE IF:   Your pain increases, or you are very uncomfortable.   You have a fever.   Your chest pain becomes worse.   You have new, unexplained symptoms.   You have nausea or vomiting.   You feel sweaty or lightheaded.   You have a cough with phlegm (sputum), or you cough up blood.  MAKE SURE YOU:   Understand these instructions.   Will watch your condition.   Will get help right away if you are not doing well or get worse.  Document Released: 04/19/2005 Document Revised: 04/08/2011 Document Reviewed: 12/14/2010 [employer] Patient Information 2012 [employer]. 

## 2011-09-28 NOTE — ED Provider Notes (Signed)
History     CSN: [id number]  Arrival date & time 09/28/11  1230   First MD Initiated Contact with Patient 09/28/11 1253      Chief Complaint  Patient presents with  . Chest Pain    (Consider location/radiation/quality/duration/timing/severity/associated sxs/prior treatment) HPI This 78 year old female has 12 hours or more of a constant left upper chest pain which is pleuritic in nature associated with fever to 101 without cough shortness of breath dizziness lightheadedness confusion rashes. The discomfort is nonexertional and constant since last evening. Past Medical History  Diagnosis Date  . Reflux   . Diverticulitis   . Hypertension     Past Surgical History  Procedure Date  . Cholecystectomy   . Abdominal hysterectomy   . Knee surgery     No family history on file.  History  Substance Use Topics  . Smoking status: Former Games developer  . Smokeless tobacco: Not on file  . Alcohol Use: Yes     occ    OB History    Grav Para Term Preterm Abortions TAB SAB Ect Mult Living                  Review of Systems  Constitutional: Positive for fever.       10 Systems reviewed and are negative for acute change except as noted in the HPI.  HENT: Negative for congestion.   Eyes: Negative for discharge and redness.  Respiratory: Negative for cough and shortness of breath.   Cardiovascular: Positive for chest pain.  Gastrointestinal: Negative for vomiting and abdominal pain.  Musculoskeletal: Negative for back pain.  Skin: Negative for rash.  Neurological: Negative for syncope, numbness and headaches.  Psychiatric/Behavioral:       No behavior change.    Allergies  Prednisone  Home Medications   Current Outpatient Rx  Name Route Sig Dispense Refill  . ALENDRONATE SODIUM 70 MG PO TABS Oral Take 70 mg by mouth every 7 (seven) days. On wednesday.   Take with a full glass of water on an empty stomach.    . ALLOPURINOL 300 MG PO TABS Oral Take 300 mg by mouth daily.    .  ASPIRIN EC 81 MG PO TBEC Oral Take 81 mg by mouth daily.    Marland Kitchen CALCIUM CARBONATE-VITAMIN D 500-200 MG-UNIT PO TABS Oral Take 1 tablet by mouth 2 (two) times daily.    Marland Kitchen PANTOPRAZOLE SODIUM 40 MG PO TBEC Oral Take 40 mg by mouth daily.    Marland Kitchen CALCIUM POLYCARBOPHIL 625 MG PO TABS Oral Take 625 mg by mouth daily.    Marland Kitchen SIMVASTATIN 40 MG PO TABS Oral Take 40 mg by mouth every evening.    Marland Kitchen SOLIFENACIN SUCCINATE 10 MG PO TABS Oral Take 10 mg by mouth daily.    Marland Kitchen NAPROXEN 500 MG PO TABS Oral Take 1 tablet (500 mg total) by mouth 2 (two) times daily. 30 tablet 0    BP 113/60  Pulse 78  Temp(Src) 98.6 F (37 C) (Oral)  Resp 20  SpO2 99%  Physical Exam  Nursing note and vitals reviewed. Constitutional:       Awake, alert, nontoxic appearance.  HENT:  Head: Atraumatic.  Eyes: Right eye exhibits no discharge. Left eye exhibits no discharge.  Neck: Neck supple.  Cardiovascular: Normal rate and regular rhythm.   No murmur heard. Pulmonary/Chest: Effort normal and breath sounds normal. No respiratory distress. She has no wheezes. She has no rales. She exhibits tenderness.  Partially reproducible anterior left upper chest wall tenderness  Abdominal: Soft. There is no tenderness. There is no rebound.  Musculoskeletal: She exhibits no edema and no tenderness.       Baseline ROM, no obvious new focal weakness.  Neurological: She is alert.       Mental status and motor strength appears baseline for patient and situation.  Skin: No rash noted.  Psychiatric: She has a normal mood and affect.    ED Course  Procedures (including critical care time) ECG: Normal sinus rhythm, ventricular rate 73, normal axis, normal intervals, no acute ischemic changes noted no significant change noted compared with May 2008  Medical screening examination/treatment/procedure(s) were conducted as a shared visit with non-physician practitioner(s) and myself.  I personally evaluated the patient during the encounter To  CDU await CTA chest r/o PE. ED dispo pnd. 1510 Labs Reviewed  CBC - Abnormal; Notable for the following:    WBC 15.9 (*)    All other components within normal limits  DIFFERENTIAL - Abnormal; Notable for the following:    Neutrophils Relative 82 (*)    Neutro Abs 12.9 (*)    Lymphocytes Relative 10 (*)    Monocytes Absolute 1.4 (*)    All other components within normal limits  COMPREHENSIVE METABOLIC PANEL - Abnormal; Notable for the following:    Potassium 3.2 (*)    Glucose, Bld 141 (*)    Creatinine, Ser 1.40 (*)    GFR calc non Af Amer 36 (*)    GFR calc Af Amer 41 (*)    All other components within normal limits  PROTIME-INR  LACTIC ACID, PLASMA  PROCALCITONIN  POCT I-STAT TROPONIN I  LAB REPORT - SCANNED   Dg Chest 2 View  09/28/2011  *RADIOLOGY REPORT*  Clinical Data: Chest pain.  CHEST - 2 VIEW  Comparison: 09/11/2006  Findings: No evidence of infiltrate, edema, pleural fluid or pneumothorax.  The heart size is stable and within normal limits. The aorta is mildly ectatic.  Degenerative changes are present in the spine.  IMPRESSION: No active disease.  Original Report Authenticated By: Reola Calkins, M.D.   Ct Angio Chest W/cm &/or Wo Cm  09/28/2011  *RADIOLOGY REPORT*  Clinical Data: Left-sided chest pain, evaluate for pulmonary embolism  CT ANGIOGRAPHY CHEST  Technique:  Multidetector CT imaging of the chest using the standard protocol during bolus administration of intravenous contrast. Multiplanar reconstructed images including MIPs were obtained and reviewed to evaluate the vascular anatomy.  Contrast: OMNIPAQUE IOHEXOL 350 MG/ML SOLN  Comparison: Chest radiograph - earlier same day;  Vascular Findings:  There is adequate opacification of the pulmonary vasculature with the main pulmonary artery measuring 337 HU.  No discrete filling defects are seen within the pulmonary arteries to suggest acute pulmonary embolism.  Normal caliber of the main pulmonary artery.   Normal heart size.  Coronary artery calcifications.  No pericardial effusion.  Normal caliber of the thoracic aorta.  Normal configuration of the aortic arch.  No definite evidence of thoracic aortic dissection.  No periaortic stranding.  Nonvascular findings:  Minimal dependent ground-glass opacities within the bilateral lower lung, favored to represent atelectasis.  No focal airspace opacities.  No pleural effusion or pneumothorax.  Central airways are patent.  Shoddy mediastinal lymph nodes are not enlarged by CT criteria with index prevascular node measuring 6 mm in greatest short axis diameter (image 26, series four) and index pre-carinal node measuring 5 mm in short axis diameter (image 27), not  enlarged by CT criteria.  No definite hilar or axillary lymphadenopathy.  Limited early arterial phase evaluation of the upper abdomen is normal.  No acute or aggressive osseous abnormalities.  Suspected DISH within the thoracic spine.  Possible 1.1 cm soft tissue nodule within the inferior aspect of the left breast (image 67, series four).  5 mm hypoattenuating lesion incidentally noted within the left lobe of the thyroid (image 3).  IMPRESSION:  1.   No explanation for patient's left sided chest pain. Specifically, no evidence of pulmonary embolism or pneumonia. 2.  Possible soft tissue nodule within the inferior aspect of the left breast.  If not recently performed, correlation with mammography is recommended.  Original Report Authenticated By: Waynard Reeds, M.D.     1. Chest wall pain       MDM  Patient / Family / Caregiver understand and agree with initial ED impression and plan with expectations set for ED visit.        Hurman Horn, MD 09/30/11 [phone]

## 2011-09-28 NOTE — ED Notes (Signed)
Patient returned from CT. Ambulated to bathroom with 1 assist. Aaox4. Nad.

## 2011-09-28 NOTE — ED Notes (Signed)
Pt is here with left chest pain to upper chest.  Pt is having pain to left chest and states it is killing her

## 2011-09-28 NOTE — ED Notes (Signed)
Dr. Fonnie Jarvis notified of difficult stick for large bore IV for CT Angio, ultrasound at bedside and two 1.88 angiocaths at bedside.

## 2011-09-28 NOTE — ED Notes (Signed)
Report received from [place], [place] patient care assumed

## 2011-09-28 NOTE — ED Notes (Signed)
Patient refuses U/A and wants to go home. Informed NP.

## 2011-09-28 NOTE — ED Notes (Signed)
MD at bedside. 

## 2011-09-28 NOTE — ED Provider Notes (Signed)
Patient in CDU awaiting completion of diagnostic testing in the evaluation of pleuritic chest pain.  ECG, cardiac markers within normal limits.  CTA does not reveal pulmonary embolism., pleural effusion, or focal airspace abnormalities.  Mild leukocytosis.  Ua ordered, but patient declined to wait for results.  Will treat for chest wall pain. Patient will follow-up with her PCP.  Jimmye Norman, NP 09/28/11 [phone]

## 2011-09-28 NOTE — ED Notes (Signed)
Code STEMI cancelled. 

## 2011-09-28 NOTE — ED Notes (Signed)
Patient reports pain has decreased to 6/10 after fentanyl administration. Patient currently waiting for attending physician to update patient on plan of care.

## 2011-09-28 NOTE — ED Notes (Signed)
Patient currently in CT and will be transported to CDU #1 when done. Family is at bedside.

## 2011-09-29 NOTE — ED Provider Notes (Signed)
Medical screening examination/treatment/procedure(s) were performed by non-physician practitioner and as supervising physician I was immediately available for consultation/collaboration.   Anastazja Isaac, MD 09/29/11 2211 

## 2011-10-25 ENCOUNTER — Telehealth: Payer: Self-pay | Admitting: Hematology and Oncology

## 2011-10-25 NOTE — Telephone Encounter (Signed)
called pt and scheduled appt for 07/09. d/t per pt. faxed over a letter to Dr. Tiburcio Pea with appt d/t

## 2011-10-26 ENCOUNTER — Telehealth: Payer: Self-pay | Admitting: Hematology and Oncology

## 2011-10-26 NOTE — Telephone Encounter (Signed)
Referred by Dr. Arvella Merles Dx- Elevated WBC.

## 2011-11-09 ENCOUNTER — Telehealth: Payer: Self-pay | Admitting: Hematology and Oncology

## 2011-11-09 ENCOUNTER — Ambulatory Visit (HOSPITAL_BASED_OUTPATIENT_CLINIC_OR_DEPARTMENT_OTHER): Payer: Medicare Other | Admitting: Lab

## 2011-11-09 ENCOUNTER — Other Ambulatory Visit: Payer: Self-pay | Admitting: Obstetrics and Gynecology

## 2011-11-09 ENCOUNTER — Telehealth: Payer: Self-pay | Admitting: *Deleted

## 2011-11-09 ENCOUNTER — Ambulatory Visit (HOSPITAL_BASED_OUTPATIENT_CLINIC_OR_DEPARTMENT_OTHER): Payer: Medicare Other | Admitting: Hematology and Oncology

## 2011-11-09 ENCOUNTER — Encounter: Payer: Self-pay | Admitting: Hematology and Oncology

## 2011-11-09 ENCOUNTER — Other Ambulatory Visit (HOSPITAL_COMMUNITY)
Admission: RE | Admit: 2011-11-09 | Discharge: 2011-11-09 | Disposition: A | Discharge status: Home / Self Care | Payer: Medicare Other | Source: Ambulatory Visit | Attending: Hematology and Oncology | Admitting: Hematology and Oncology

## 2011-11-09 ENCOUNTER — Ambulatory Visit: Payer: Medicare Other

## 2011-11-09 VITALS — BP 145/80 | HR 94 | Temp 97.8°F | Ht 64.0 in | Wt 156.6 lb

## 2011-11-09 DIAGNOSIS — D72829 Elevated white blood cell count, unspecified: Secondary | ICD-10-CM | POA: Insufficient documentation

## 2011-11-09 DIAGNOSIS — N6459 Other signs and symptoms in breast: Secondary | ICD-10-CM

## 2011-11-09 DIAGNOSIS — M25519 Pain in unspecified shoulder: Secondary | ICD-10-CM

## 2011-11-09 DIAGNOSIS — R634 Abnormal weight loss: Secondary | ICD-10-CM

## 2011-11-09 DIAGNOSIS — R5381 Other malaise: Secondary | ICD-10-CM

## 2011-11-09 DIAGNOSIS — D539 Nutritional anemia, unspecified: Secondary | ICD-10-CM

## 2011-11-09 DIAGNOSIS — N289 Disorder of kidney and ureter, unspecified: Secondary | ICD-10-CM

## 2011-11-09 DIAGNOSIS — M109 Gout, unspecified: Secondary | ICD-10-CM | POA: Insufficient documentation

## 2011-11-09 HISTORY — DX: Disorder of kidney and ureter, unspecified: N28.9

## 2011-11-09 LAB — CBC WITH DIFFERENTIAL/PLATELET
BASO%: 0.7 % (ref 0.0–2.0)
EOS%: 0.9 % (ref 0.0–7.0)
LYMPH%: 17.6 % (ref 14.0–49.7)
MCHC: 31.2 g/dL — ABNORMAL LOW (ref 31.5–36.0)
MONO#: 1 10*3/uL — ABNORMAL HIGH (ref 0.1–0.9)
Platelets: 363 10*3/uL (ref 145–400)
RBC: 3.84 10*6/uL (ref 3.70–5.45)
WBC: 14.2 10*3/uL — ABNORMAL HIGH (ref 3.9–10.3)

## 2011-11-09 LAB — MORPHOLOGY: PLT EST: ADEQUATE

## 2011-11-09 NOTE — Telephone Encounter (Signed)
Gave pt appt for U/S and MD, sent pt to lab today

## 2011-11-09 NOTE — Patient Instructions (Signed)
Melissa Morton  [id number]  [facility] Discharge Instructions  RECOMMENDATIONS MADE BY THE CONSULTANT AND ANY TEST RESULTS WILL BE SENT TO YOUR REFERRING DOCTOR.   EXAM FINDINGS BY MD TODAY AND SIGNS AND SYMPTOMS TO REPORT TO CLINIC OR PRIMARY MD:   Your current list of medications are: Current Outpatient Prescriptions  Medication Sig Dispense Refill  . albuterol (PROVENTIL) (2.5 MG/3ML) 0.083% nebulizer solution Take 2.5 mg by nebulization as needed.      Marland Kitchen alendronate (FOSAMAX) 70 MG tablet Take 70 mg by mouth every 7 (seven) days. On wednesday.   Take with a full glass of water on an empty stomach.      Marland Kitchen allopurinol (ZYLOPRIM) 300 MG tablet Take 300 mg by mouth daily.      Marland Kitchen aspirin EC 81 MG tablet Take 81 mg by mouth daily.      . calcium-vitamin D (OSCAL WITH D) 500-200 MG-UNIT per tablet Take 1 tablet by mouth 2 (two) times daily.      . colchicine 0.6 MG tablet Take 0.6 mg by mouth as needed.      . furosemide (LASIX) 40 MG tablet Take 40 mg by mouth daily.      . pantoprazole (PROTONIX) 40 MG tablet Take 40 mg by mouth daily.      . polycarbophil (FIBERCON) 625 MG tablet Take 625 mg by mouth daily.      . potassium chloride SA (K-DUR,KLOR-CON) 20 MEQ tablet Take 20 mEq by mouth daily.      . simvastatin (ZOCOR) 40 MG tablet Take 40 mg by mouth every evening.      . solifenacin (VESICARE) 10 MG tablet Take 10 mg by mouth daily.      . traMADol (ULTRAM) 50 MG tablet Take 50 mg by mouth as needed.         INSTRUCTIONS GIVEN AND DISCUSSED:   SPECIAL INSTRUCTIONS/FOLLOW-UP:  See above.  I acknowledge that I have been informed and understand all the instructions given to me and received a copy. I do not have any more questions at this time, but understand that I may call the [facility] at [phone] during business hours should I have any further questions or need assistance in obtaining follow-up care.

## 2011-11-09 NOTE — Progress Notes (Signed)
Patient came in today as a new patient with her three daughters,she has one insurance she also had a discount through [employer] but I told her that it has expired,and so wanted to reapply so I gave her another epp application to fill out and mail back in to me.

## 2011-11-09 NOTE — Progress Notes (Signed)
This office note has been dictated.

## 2011-11-09 NOTE — Progress Notes (Signed)
CC:   Melissa Morton, M.D. Melissa Morton. Melissa Craw, MD Melissa Morton, M.D.  IDENTIFYING STATEMENT:  The patient is a 78 year old woman seen at request of Dr. Tiburcio Pea with leukocytosis.  HISTORY OF PRESENT ILLNESS:  The patient's history is significant in that in the last 2 months she has had periodic fevers ranging as high as 100.1 Fahrenheit.  At the time of fevers the patient associates this with lethargy, fleeting bilateral shoulder pain and anorexia.  She has lost 30 pounds of weight in the last 2 months.  The patient has been asymptomatic in last 2 weeks.  Of note, she was seen in the emergency room on 09/29/2011; was diagnosed with urinary tract infection.  Was given Cipro followed by Levaquin.  Her last blood count at Dr. Johnathan Hausen office on 10/19/2011 had noted a white cell count of 18, decreased from 21 a week prior.  Her platelet count was also elevated at 541.  Six months ago on 05/06/2011 white cell count was 7.9, platelets 244.  She also has a history of diverticulitis.  Her last attack was 6 months ago. Also has a history of gout, last attack was 6 months ago.  CT scan performed in the emergency room on 09/29/2011 had shown a 1.1 cm soft tissue nodule in the superior aspect of the left breast.  The patient's last mammogram she reports was performed 6 months ago and that was unremarkable.  PAST MEDICAL HISTORY: 1. Chronic kidney disease. 2. Gout. 3. Hypertension. 4. Hyperlipidemia. 5. Osteoporosis. 6. Reflux. 7. Status post colectomy. 8. Status post abdominal hysterectomy. 9. Status post knee surgery. 10.Status post bladder tuck for bladder incontinence.  ALLERGIES:  Prednisone, Dilaudid, nonsteroidals.  MEDICATIONS: 1. Proventil as needed. 2. Fosamax 70 mg weekly. 3. Allopurinol 200 mg daily. 4. Aspirin 81 mg daily. 5. Os-Cal with D 1 tablet 2 times daily. 6. FiberCon 625 mg daily. 7. Colchicine 0.3 mg as needed. 8. Lasix 40 mg daily. 9. Protonix 40 mg  daily. 10.K-Dur 20 mEq daily. 11.Zocor 40 mg q.h.s. 12.VESIcare 10 mg daily. 13.Tramadol 50 mg as needed.  SOCIAL HISTORY:  The patient is widowed with 5 children.  Her 3 daughters are here with her.  Previous tobacco user.  Smoked a pack that lasted a month for 10 years.  Is a social drinker.  Is a retired [employer] Services operational officer.  FAMILY HISTORY:  The patient's sister has breast cancer.  Also has history of chronic leukemia.  REVIEW OF SYSTEMS:  Presently denies fever, chills, night sweats. Denies anorexia.  Weight loss of 30 pounds over the last 2 months has stabilized.  GI:  Denies nausea, vomiting, abdominal pain, diarrhea, melena, hematochezia.  GU:  Denies dysuria, hematuria, nocturia, frequency.  Cardiovascular:  Denies chest pain, PND, orthopnea, ankle swelling.  Respiratory:  Denies cough, hemoptysis, wheeze, shortness of breath.  Skin:  No bruising or bleeding.  Musculoskeletal:  Denies joint aches, muscle pains.  Neurologic:  Denies headache, vision change, extremity weakness.  Rest of review of systems negative.  PHYSICAL EXAM:  General:  The patient is a well-appearing, well- nourished woman in no distress.  Vitals:  Pulse 94, blood pressure 145/78, temperature 97.8, respirations 20, weight 156.6 pounds.  HEENT: Head is atraumatic, normocephalic.  Sclerae anicteric.  Mouth moist. Neck:  Supple.  Chest:  Clear.  CVS:  Unremarkable.  Abdomen:  Soft, nontender.  Bowel sounds present.  Extremities:  No calf tenderness, edema.  Pulses present and symmetrical.  Lymph nodes:  No palpable cervical,  axillary, inguinal adenopathy.  CNS:  Nonfocal.  IMPRESSION AND PLAN:  Melissa Morton is a 78 year old woman with leukocytosis.  She was well dating back to April when she began to have fever associated with musculoskeletal pain, anorexia and weight loss. She was recently diagnosed with a urinary tract infection; was placed on a course of antibiotics.  Has not had recurrent symptoms for the past  2 weeks.  Her elevated white cell count may be due to leukemoid reaction. However, it is also important to rule out a myeloproliferative disorder. She returns to lab and will obtain a CBC with diff and morphology.  Will obtain ESR, leukocyte alkaline phosphatase.  Obtain flow cytometry. Will obtain peripheral blood for JAK2 mutation and BCR/ABL.  We will also obtain urinalysis even though the patient is asymptomatic.  We will schedule an ultrasound to assess size of liver and spleen.  Indicated to the patient the findings of a 1.1 cm nodule on her recent CT scan. Indicated that she requires a diagnostic mammogram.  The patient's daughter, Melissa Morton, is an ultrasound tech and will follow up with her ob/gyn, Dr. Waynard Reeds.  The patient follows up in a week's time to discuss results.  I spent more than half the time discussing coordinating care.  To do: Perform breast exam at next visit.  ______________________________ Melissa Morton, M.D. LIO/MEDQ  D:  11/09/2011  T:  11/09/2011  Job:  [id number]

## 2011-11-09 NOTE — Telephone Encounter (Signed)
Per lab, patient was not able to leave enough of a sample to test for a urinalysis.  Was given a collection cup but did not return another specimen to lab.  Called patient to check on status and try to determine if she will return specimen tomorrow.  Awaiting return call.

## 2011-11-10 ENCOUNTER — Other Ambulatory Visit: Payer: Self-pay | Admitting: *Deleted

## 2011-11-10 ENCOUNTER — Other Ambulatory Visit: Payer: Self-pay | Admitting: Hematology and Oncology

## 2011-11-10 DIAGNOSIS — D72829 Elevated white blood cell count, unspecified: Secondary | ICD-10-CM

## 2011-11-10 DIAGNOSIS — N390 Urinary tract infection, site not specified: Secondary | ICD-10-CM

## 2011-11-10 LAB — URINALYSIS, MICROSCOPIC - CHCC
Bilirubin (Urine): NEGATIVE
Ketones: NEGATIVE mg/dL
Protein: NEGATIVE mg/dL
Specific Gravity, Urine: 1.01 (ref 1.003–1.035)
pH: 6.5 (ref 4.6–8.0)

## 2011-11-11 ENCOUNTER — Ambulatory Visit (HOSPITAL_COMMUNITY)
Admission: RE | Admit: 2011-11-11 | Discharge: 2011-11-11 | Disposition: A | Discharge status: Home / Self Care | Payer: Medicare Other | Source: Ambulatory Visit | Attending: Hematology and Oncology | Admitting: Hematology and Oncology

## 2011-11-11 DIAGNOSIS — D72829 Elevated white blood cell count, unspecified: Secondary | ICD-10-CM | POA: Insufficient documentation

## 2011-11-11 DIAGNOSIS — D473 Essential (hemorrhagic) thrombocythemia: Secondary | ICD-10-CM | POA: Insufficient documentation

## 2011-11-11 DIAGNOSIS — N200 Calculus of kidney: Secondary | ICD-10-CM | POA: Insufficient documentation

## 2011-11-12 LAB — LEUKOCYTE ALKALINE PHOSPHATASE: Leukocyte Alkaline Phos Stain: 50

## 2011-11-12 LAB — COMPREHENSIVE METABOLIC PANEL
ALT: <8 U/L (ref 0–35)
BUN: 18 mg/dL (ref 6–23)
CO2: 26 mEq/L (ref 19–32)
Creatinine, Ser: 1.39 mg/dL — ABNORMAL HIGH (ref 0.50–1.10)
Glucose, Bld: 111 mg/dL — ABNORMAL HIGH (ref 70–99)
Total Bilirubin: 0.5 mg/dL (ref 0.3–1.2)

## 2011-11-12 LAB — URINE CULTURE

## 2011-11-12 LAB — FERRITIN: Ferritin: 139 ng/mL (ref 10–291)

## 2011-11-12 LAB — SEDIMENTATION RATE: Sed Rate: 49 mm/hr — ABNORMAL HIGH (ref 0–22)

## 2011-11-12 LAB — FLOW CYTOMETRY

## 2011-11-17 ENCOUNTER — Telehealth: Payer: Self-pay | Admitting: Hematology and Oncology

## 2011-11-17 ENCOUNTER — Ambulatory Visit (HOSPITAL_BASED_OUTPATIENT_CLINIC_OR_DEPARTMENT_OTHER): Payer: Medicare Other | Admitting: Hematology and Oncology

## 2011-11-17 ENCOUNTER — Encounter: Payer: Self-pay | Admitting: Hematology and Oncology

## 2011-11-17 VITALS — BP 138/67 | HR 90 | Temp 97.4°F | Ht 64.0 in | Wt 159.9 lb

## 2011-11-17 DIAGNOSIS — D72829 Elevated white blood cell count, unspecified: Secondary | ICD-10-CM

## 2011-11-17 NOTE — Telephone Encounter (Signed)
appts made and printed for pt aom °

## 2011-11-17 NOTE — Patient Instructions (Addendum)
Maddix M Wilmott  [id number]  [facility] Discharge Instructions  RECOMMENDATIONS MADE BY THE CONSULTANT AND ANY TEST RESULTS WILL BE SENT TO YOUR REFERRING DOCTOR.   EXAM FINDINGS BY MD TODAY AND SIGNS AND SYMPTOMS TO REPORT TO CLINIC OR PRIMARY MD:   Your current list of medications are: Current Outpatient Prescriptions  Medication Sig Dispense Refill  . albuterol (PROVENTIL) (2.5 MG/3ML) 0.083% nebulizer solution Take 2.5 mg by nebulization as needed.      . alendronate (FOSAMAX) 70 MG tablet Take 70 mg by mouth every 7 (seven) days. On wednesday.   Take with a full glass of water on an empty stomach.      . allopurinol (ZYLOPRIM) 300 MG tablet Take 300 mg by mouth daily.      . aspirin EC 81 MG tablet Take 81 mg by mouth daily.      . calcium-vitamin D (OSCAL WITH D) 500-200 MG-UNIT per tablet Take 1 tablet by mouth 2 (two) times daily.      . colchicine 0.6 MG tablet Take 0.6 mg by mouth as needed.      . furosemide (LASIX) 40 MG tablet Take 40 mg by mouth daily.      . pantoprazole (PROTONIX) 40 MG tablet Take 40 mg by mouth daily.      . polycarbophil (FIBERCON) 625 MG tablet Take 625 mg by mouth daily.      . potassium chloride SA (K-DUR,KLOR-CON) 20 MEQ tablet Take 20 mEq by mouth daily.      . simvastatin (ZOCOR) 40 MG tablet Take 40 mg by mouth every evening.      . solifenacin (VESICARE) 10 MG tablet Take 10 mg by mouth daily.      . traMADol (ULTRAM) 50 MG tablet Take 50 mg by mouth as needed.         INSTRUCTIONS GIVEN AND DISCUSSED:   SPECIAL INSTRUCTIONS/FOLLOW-UP:  See above.  I acknowledge that I have been informed and understand all the instructions given to me and received a copy. I do not have any more questions at this time, but understand that I may call the [facility] at [phone] during business hours should I have any further questions or need assistance in obtaining follow-up care.      

## 2011-11-17 NOTE — Progress Notes (Signed)
CC:   [name], M.D. Freddrick March. Tenny Craw, MD Dyke Maes, M.D.  IDENTIFYING STATEMENT:  The patient is a 78 year old woman who presents to discuss results.  INTERVAL HISTORY:  In summary, the patient was referred for persistent leukocytosis with a 59-month period of periodic fevers associated with lethargy, weakness, and fleeting bilateral shoulder pain with weight loss.  In the last 2 weeks or so, she has been asymptomatic.  Is gaining weight.  Was diagnosed with a urinary tract infection recently and was placed on antibiotics.  Workup for leukocytosis notes the following results.  On 11/09/2011 white cell count down to 14.2, hemoglobin 10.7, hematocrit 34.1, platelets 363; morphology noted polychromasia, otherwise unremarkable; peripheral blood for BCR-ABL was not detected; JAK2 mutation was also not detected; peripheral blood for flow cytometry showed no abnormal T-cell phenotype or monoclonal B-cell population. Urinalysis was negative for nitrites and leukocyte esterase, but there was moderate bacteria, culture negative; sedimentation rate was elevated at 49 and leukocyte alkaline phosphatase 50.  MEDICATIONS:  Reviewed and updated.  ALLERGIES:  Reviewed.  PAST MEDICAL HISTORY/FAMILY HISTORY/SOCIAL HISTORY:  Unchanged.  REVIEW OF SYSTEMS:  Presently 10-point review of systems negative.  PHYSICAL EXAMINATION:  General:  The patient is a well-appearing, well- nourished woman in no distress.  Vitals:  Pulse 90, blood pressure 138/67, temperature 97.4, respirations 20, weight 159 pounds.  HEENT: Head is atraumatic, normocephalic.  Sclerae anicteric.  Mouth moist. Chest:  Clear.  Breast Exam:  Notes no dominant masses or nipple discharge.  Abdomen:  Soft, nontender.  Bowel sounds present. Extremities:  No calf tenderness.  Pulses present and symmetrical.  LABORATORY DATA:  As above.  In addition, sodium 139, potassium 4.5, chloride 101, CO2 of 26, BUN 18,  creatinine 1.39, glucose 111.  T-bili 0.5, alkaline phosphatase 84, AST 12, ALT less than 8.  Ferritin 139.  IMPRESSION AND PLAN:  Melissa Morton is a 78 year old woman referred for leukocytosis.  Workup thus far indicates that this is probable reactive to her underlying infectious/inflammatory process.  ESR was elevated. She recently completed antibiotics for a urinary tract infection.  She may have had polymyalgia/polymyositis/connective tissue disorder.  It is hard to say at this time, but there is no underlying evidence for leukemia or myeloproliferative disorder at this time.  Her recent ultrasound showed That the liver and spleen were unremarkable.  At this time, I would recommned brief surveillance from the hematology standpoint.  I have stressed that if her symptoms reoccur, she is to follow up with Dr. Tiburcio Pea.  She informs me that a mammogram to further evaluate the lesion seen on CT scan has been scheduled for next week.  She and her family were reassured.  She follows up in 6 months' time with a CBC.    ______________________________ Laurice Record, M.D. LIO/MEDQ  D:  11/17/2011  T:  11/17/2011  Job:  [id number]

## 2011-11-17 NOTE — Progress Notes (Signed)
This office note has been dictated.

## 2011-11-18 ENCOUNTER — Encounter: Payer: Self-pay | Admitting: Hematology and Oncology

## 2011-11-18 NOTE — Progress Notes (Signed)
Patient approve for 100% Discount for a family of one,income 13,[phone]  11/18/11 - 05/20/12.

## 2011-11-22 ENCOUNTER — Ambulatory Visit
Admission: RE | Admit: 2011-11-22 | Discharge: 2011-11-22 | Disposition: A | Discharge status: Home / Self Care | Payer: Medicare Other | Source: Ambulatory Visit | Attending: Obstetrics and Gynecology | Admitting: Obstetrics and Gynecology

## 2011-11-22 ENCOUNTER — Other Ambulatory Visit: Payer: Self-pay | Admitting: Obstetrics and Gynecology

## 2011-11-22 DIAGNOSIS — N6459 Other signs and symptoms in breast: Secondary | ICD-10-CM

## 2011-11-24 ENCOUNTER — Other Ambulatory Visit: Payer: Self-pay | Admitting: Obstetrics and Gynecology

## 2011-11-24 ENCOUNTER — Ambulatory Visit
Admission: RE | Admit: 2011-11-24 | Discharge: 2011-11-24 | Disposition: A | Discharge status: Home / Self Care | Payer: Medicare Other | Source: Ambulatory Visit | Attending: Obstetrics and Gynecology | Admitting: Obstetrics and Gynecology

## 2011-11-24 DIAGNOSIS — N6459 Other signs and symptoms in breast: Secondary | ICD-10-CM

## 2011-11-24 DIAGNOSIS — C50912 Malignant neoplasm of unspecified site of left female breast: Secondary | ICD-10-CM

## 2011-11-24 HISTORY — DX: Malignant neoplasm of unspecified site of left female breast: C50.912

## 2011-11-26 ENCOUNTER — Telehealth: Payer: Self-pay | Admitting: *Deleted

## 2011-11-26 NOTE — Telephone Encounter (Signed)
Was informed by Melissa Morton,  Breast Navigator re:  Pt has recently been diagnosed with breast cancer.   Pt will be seeing Dr. Jamey Ripa on 12/07/11 ,  And will also be presented at the breast conference.

## 2011-11-30 ENCOUNTER — Telehealth: Payer: Self-pay | Admitting: Nurse Practitioner

## 2011-11-30 NOTE — Telephone Encounter (Signed)
Spoke with patient- informed of plan per prior note.

## 2011-11-30 NOTE — Telephone Encounter (Signed)
Called pt to inform her per Dr Dalene Carrow- this office will see her for her new diagnosis of breast cancer after she has her surgery.  Would like for her to keep this office informed of appointments and outcomes.  Left message requesting a return call.

## 2011-12-07 ENCOUNTER — Encounter (INDEPENDENT_AMBULATORY_CARE_PROVIDER_SITE_OTHER): Payer: Self-pay | Admitting: Surgery

## 2011-12-07 ENCOUNTER — Ambulatory Visit (INDEPENDENT_AMBULATORY_CARE_PROVIDER_SITE_OTHER): Payer: Medicare Other | Admitting: Surgery

## 2011-12-07 VITALS — BP 142/64 | HR 76 | Temp 97.5°F | Resp 20 | Ht 64.0 in | Wt 158.8 lb

## 2011-12-07 DIAGNOSIS — C50512 Malignant neoplasm of lower-outer quadrant of left female breast: Secondary | ICD-10-CM | POA: Insufficient documentation

## 2011-12-07 DIAGNOSIS — C50912 Malignant neoplasm of unspecified site of left female breast: Secondary | ICD-10-CM

## 2011-12-07 DIAGNOSIS — C50919 Malignant neoplasm of unspecified site of unspecified female breast: Secondary | ICD-10-CM

## 2011-12-07 NOTE — Patient Instructions (Signed)
We will schedule outpatient surgery for removal of your left breast cancer and removal of some sentinel lymph nodes from the left armpit area.

## 2011-12-07 NOTE — Progress Notes (Signed)
Patient ID: Melissa Morton, female   DOB: December 21, 1933, 78 y.o.   MRN: [id number]  Chief Complaint  Patient presents with  . Breast Cancer    left    HPI Melissa Morton is a 78 y.o. female.  She was being evaluated for an elevated white cell count. As part of the evaluation a CT scan of the chest was done. A suspicious area was found in the left breast. A core biopsy had shown invasive ductal carcinoma, receptor-positive, HER-2 equivocal. The patient is having no breast symptoms and was unaware of her breast mass. Etiology of her elevated white count is not clear but it seems to have been returning back towards normal. She comes to me today for evaluation of her breast cancer. Of note is that her sister had breast cancer when she was in her 8s. This is the only family history breast cancer and there is no family history of ovarian cancer HPI  Past Medical History  Diagnosis Date  . Reflux   . Diverticulitis   . Hypertension   . Renal insufficiency 11/09/2011  . Breast cancer, left breast 12/07/2011  . Breast cancer, left breast 12/07/2011    Past Surgical History  Procedure Date  . Cholecystectomy   . Abdominal hysterectomy   . Knee surgery     History reviewed. No pertinent family history.  Social History History  Substance Use Topics  . Smoking status: Former Smoker -- 0.2 packs/day for 10 years    Types: Cigarettes    Quit date: 11/09/1991  . Smokeless tobacco: Never Used  . Alcohol Use: Yes     occ    Allergies  Allergen Reactions  . Hydromorphone Hcl Nausea And Vomiting  . Nsaids     11/09/11- Pt has renal issues.  . Prednisone Nausea And Vomiting    Current Outpatient Prescriptions  Medication Sig Dispense Refill  . albuterol (PROVENTIL) (2.5 MG/3ML) 0.083% nebulizer solution Take 2.5 mg by nebulization as needed.      Marland Kitchen alendronate (FOSAMAX) 70 MG tablet Take 70 mg by mouth every 7 (seven) days. On wednesday.   Take with a full glass of water on an empty stomach.       Marland Kitchen allopurinol (ZYLOPRIM) 300 MG tablet Take 300 mg by mouth daily.      Marland Kitchen aspirin EC 81 MG tablet Take 81 mg by mouth daily.      . calcium-vitamin D (OSCAL WITH D) 500-200 MG-UNIT per tablet Take 1 tablet by mouth 2 (two) times daily.      . colchicine 0.6 MG tablet Take 0.6 mg by mouth as needed.      . furosemide (LASIX) 40 MG tablet Take 40 mg by mouth daily.      . pantoprazole (PROTONIX) 40 MG tablet Take 40 mg by mouth daily.      . polycarbophil (FIBERCON) 625 MG tablet Take 625 mg by mouth daily.      . potassium chloride SA (K-DUR,KLOR-CON) 20 MEQ tablet Take 20 mEq by mouth daily.      . simvastatin (ZOCOR) 40 MG tablet Take 40 mg by mouth every evening.      . solifenacin (VESICARE) 10 MG tablet Take 10 mg by mouth daily.      . traMADol (ULTRAM) 50 MG tablet Take 50 mg by mouth as needed.      . nystatin (MYCOSTATIN) 100000 UNIT/ML suspension Take 100,000 Units by mouth 4 times daily.  Review of Systems Review of Systems  Constitutional: Negative for fever, chills and unexpected weight change.  HENT: Positive for hearing loss. Negative for congestion, sore throat, trouble swallowing and voice change.   Eyes: Negative for visual disturbance.  Respiratory: Negative for cough and wheezing.   Cardiovascular: Negative for chest pain, palpitations and leg swelling.  Gastrointestinal: Negative for nausea, vomiting, abdominal pain, diarrhea, constipation, blood in stool, abdominal distention and anal bleeding.  Genitourinary: Negative for hematuria, vaginal bleeding and difficulty urinating.  Musculoskeletal: Negative for arthralgias.  Skin: Negative for rash and wound.  Neurological: Negative for seizures, syncope and headaches.  Hematological: Negative for adenopathy. Does not bruise/bleed easily.  Psychiatric/Behavioral: Negative for confusion.    Blood pressure 142/64, pulse 76, temperature 97.5 F (36.4 C), temperature source Temporal, resp. rate 20, height 5\' 4"   (1.626 m), weight 158 lb 12.8 oz (72.031 kg).  Physical Exam Physical Exam  Vitals reviewed. Constitutional: She is oriented to person, place, and time. She appears well-developed and well-nourished. No distress.  HENT:  Head: Normocephalic and atraumatic.  Mouth/Throat: Oropharynx is clear and moist.  Eyes: Conjunctivae and EOM are normal. Pupils are equal, round, and reactive to light. No scleral icterus.  Neck: Normal range of motion. Neck supple. No tracheal deviation present. No thyromegaly present.  Cardiovascular: Normal rate, regular rhythm, normal heart sounds and intact distal pulses.  Exam reveals no gallop and no friction rub.   No murmur heard. Pulmonary/Chest: Effort normal and breath sounds normal. No respiratory distress. She has no wheezes. She has no rales.         Ecchymotic area as diagrammed no dominant mass  Abdominal: Soft. Bowel sounds are normal. She exhibits no distension and no mass. There is no tenderness. There is no rebound and no guarding.  Musculoskeletal: Normal range of motion. She exhibits no edema and no tenderness.  Neurological: She is alert and oriented to person, place, and time.  Skin: Skin is warm and dry. No rash noted. She is not diaphoretic. No erythema.  Psychiatric: She has a normal mood and affect. Her behavior is normal. Judgment and thought content normal.    Data Reviewed Mammogram films and reports, scans Melvern Banker, with chronic medical record chart, pathology report and films reviewed with the pathologist and radiologist  Assessment    Clinical stage I left breast cancer lower outer quadrant    Plan    I have explained the pathophysiology and staging of breast cancer with particular attention to her exact situation. We discussed the multidisciplinary approach to breast cancer which often includes both medical and radiation oncology consultations.  We also discussed surgical options for the treatment of breast cancer including  lumpectomy and mastectomy with possible reconstructive surgery. In addition we talked about the evaluation and management of lymph nodes including a description of sentinel lymph node biopsy and axillary dissections. We reviewed potential complications and risks including bleeding, infection, numbness,  lymphedema, and the potential need for additional surgery.  She understands that for patients who are candidate for lumpectomy or mastectomy there is an equal survival rate with either technique, but a slightly higher local recurrence rate with lumpectomy. In addition she knows that a lumpectomy usually requires postoperative radiation as part of the management of the breast cancer.  We have discussed the likely postoperative course and plans for followup.  I have given the patient some written information that reviewed all of these issues. I believe her questions are answered and that she has a  good understanding of the issues.  The patient is an excellent candidate for lumpectomy with sentinel node evaluation and that was the recommendation of the breast multidisciplinary conference. We'll go ahead and make arrangements to get this scheduled for her. I think all questions have been answered.        Humza Tallerico J 12/07/2011, 5:38 PM

## 2011-12-08 ENCOUNTER — Other Ambulatory Visit (INDEPENDENT_AMBULATORY_CARE_PROVIDER_SITE_OTHER): Payer: Self-pay | Admitting: Surgery

## 2011-12-08 DIAGNOSIS — C50912 Malignant neoplasm of unspecified site of left female breast: Secondary | ICD-10-CM

## 2011-12-16 ENCOUNTER — Other Ambulatory Visit: Payer: Self-pay | Admitting: *Deleted

## 2011-12-16 ENCOUNTER — Telehealth: Payer: Self-pay | Admitting: Hematology and Oncology

## 2011-12-16 DIAGNOSIS — C50912 Malignant neoplasm of unspecified site of left female breast: Secondary | ICD-10-CM

## 2011-12-16 NOTE — Telephone Encounter (Signed)
lmonvm for pt re appt for 8/29 and mailed schedule.

## 2011-12-28 ENCOUNTER — Encounter (HOSPITAL_BASED_OUTPATIENT_CLINIC_OR_DEPARTMENT_OTHER): Payer: Self-pay | Admitting: *Deleted

## 2011-12-28 NOTE — Progress Notes (Signed)
To come in for labs-had ekg 5/13 and cxr Er 5/13 for chest pain-ruled out-no cardiology Did see a cardiologist 2008 for cp-stress test was neg-no ischemia

## 2011-12-30 ENCOUNTER — Other Ambulatory Visit (HOSPITAL_BASED_OUTPATIENT_CLINIC_OR_DEPARTMENT_OTHER): Payer: Medicare Other | Admitting: Lab

## 2011-12-30 ENCOUNTER — Ambulatory Visit (HOSPITAL_BASED_OUTPATIENT_CLINIC_OR_DEPARTMENT_OTHER): Payer: Medicare Other | Admitting: Hematology and Oncology

## 2011-12-30 ENCOUNTER — Encounter: Payer: Self-pay | Admitting: Hematology and Oncology

## 2011-12-30 VITALS — BP 142/70 | HR 77 | Temp 97.4°F | Resp 18 | Ht 64.0 in | Wt 158.4 lb

## 2011-12-30 DIAGNOSIS — C50919 Malignant neoplasm of unspecified site of unspecified female breast: Secondary | ICD-10-CM

## 2011-12-30 DIAGNOSIS — C50912 Malignant neoplasm of unspecified site of left female breast: Secondary | ICD-10-CM

## 2011-12-30 DIAGNOSIS — Z170 Estrogen receptor positive status [ER+]: Secondary | ICD-10-CM

## 2011-12-30 DIAGNOSIS — C50519 Malignant neoplasm of lower-outer quadrant of unspecified female breast: Secondary | ICD-10-CM

## 2011-12-30 LAB — CBC WITH DIFFERENTIAL/PLATELET
Basophils Absolute: 0 10*3/uL (ref 0.0–0.1)
EOS%: 3.7 % (ref 0.0–7.0)
HCT: 37.4 % (ref 34.8–46.6)
HGB: 12 g/dL (ref 11.6–15.9)
MCH: 28.1 pg (ref 25.1–34.0)
MCV: 87.6 fL (ref 79.5–101.0)
MONO%: 6.4 % (ref 0.0–14.0)
NEUT%: 59.9 % (ref 38.4–76.8)
Platelets: 273 10*3/uL (ref 145–400)

## 2011-12-30 LAB — COMPREHENSIVE METABOLIC PANEL (CC13)
ALT: 10 U/L (ref 0–55)
AST: 16 U/L (ref 5–34)
Calcium: 9.6 mg/dL (ref 8.4–10.4)
Creatinine: 1.5 mg/dL — ABNORMAL HIGH (ref 0.6–1.1)
Total Protein: 6.2 g/dL — ABNORMAL LOW (ref 6.4–8.3)

## 2011-12-30 LAB — LACTATE DEHYDROGENASE (CC13): LDH: 170 U/L (ref 125–220)

## 2011-12-30 NOTE — Progress Notes (Signed)
This office note has been dictated.

## 2011-12-30 NOTE — Progress Notes (Signed)
CC:   Melissa Morton, M.D. Melissa Morton, M.D. Melissa Morton. Melissa Craw, MD Melissa Morton, M.D.  IDENTIFYING STATEMENT:  The patient is a 78 year old woman recently diagnosed with breast cancer who presents for followup.  INTERVAL HISTORY:  Since the patient's last visit in summary, CT had noted a mass in her left breast.  A diagnostic mammogram with ultrasound on 11/24/2011 had shown an 8 x 10 x 30 mm, irregular, hypoechoic mass at the 4 o'clock position of the left breast at 7 cm from the nipple.  She went on to receive an ultrasound core biopsy of the lesion.  Pathology was consistent with an invasive ductal carcinoma.  Estrogen receptor was 100%, progesterone receptor 100% positive.  Ki-67 proliferation marker was about 14% and HER-2/neu amplification by FISH was equivocal.  The patient has since consulted with Dr. Burman Blacksmith who plans for lumpectomy with sentinel node evaluation on 01/04/2012.  She will require radiation therapy.  The patient has no current complaints.  Medicines reviewed and updated.  ALLERGIES:  Morphine, nonsteroidal, prednisone.  PHYSICAL EXAM:  General: Alert and oriented x3. Vitals:  Pulse 77, blood pressure 142/70, temperature 97.4, respirations 18, weight 158 pounds. HEENT:  Atraumatic, normocephalic.  Sclerae anicteric.  Mouth moist. Chest:  Clear.  Abdomen:  Soft, nontender.  Bowel sounds present. Extremities:  No edema.  Breast exam:  No dominant palpable mass or nipple discharge.  No axillary adenopathy.  LAB DATA:  CBC:  12/30/2011, white cell count 7.8, hemoglobin 12, hematocrit 37.4, platelets 273.  CMET pending.  IMPRESSION AND PLAN:  The patient is a 78 year old woman recently diagnosed with invasive ductal carcinoma of the left breast following ultrasound core biopsy.  Tumor was ER 100% positive, PR 100% positive. Ki-67 was 14%.  HER-2 was equivocal.  The patient is due to undergo a lumpectomy with sentinel node sampling on  01/04/2012.  She follows up thereafter.  She will be referred to genetics as her sister had breast Cancer and leukemia.    ______________________________ Laurice Record, M.D. LIO/MEDQ  D:  12/30/2011  T:  12/30/2011  Job:  [id number]

## 2011-12-30 NOTE — Patient Instructions (Addendum)
Melissa Morton  [id number]   [facility] - AFTER VISIT SUMMARY   **RECOMMENDATIONS MADE BY THE CONSULTANT AND ANY TEST    RESULTS WILL BE SENT TO YOUR REFERRING DOCTORS.   YOUR EXAM FINDINGS, LABS AND RESULTS WERE DISCUSSED BY YOUR MD TODAY.    Your vital signs are: Filed Vitals:   12/30/11 1054  BP: 142/70  Pulse: 77  Temp: 97.4 F (36.3 C)  Resp: 18    Your Updated drug allergies are: Allergies as of 12/30/2011 - Review Complete 12/30/2011  Allergen Reaction Noted  . Hydromorphone hcl Nausea And Vomiting 11/09/2011  . Nsaids  11/09/2011  . Prednisone Nausea And Vomiting 09/28/2011    Your current list of medications are: Current Outpatient Prescriptions  Medication Sig Dispense Refill  . albuterol (PROVENTIL) (2.5 MG/3ML) 0.083% nebulizer solution Take 2.5 mg by nebulization as needed.      Marland Kitchen alendronate (FOSAMAX) 70 MG tablet Take 70 mg by mouth every 7 (seven) days. On wednesday.   Take with a full glass of water on an empty stomach.      Marland Kitchen allopurinol (ZYLOPRIM) 300 MG tablet Take 300 mg by mouth daily.      Marland Kitchen aspirin EC 81 MG tablet Take 81 mg by mouth daily.      . calcium-vitamin D (OSCAL WITH D) 500-200 MG-UNIT per tablet Take 1 tablet by mouth 2 (two) times daily.      . furosemide (LASIX) 40 MG tablet Take 40 mg by mouth daily.      . pantoprazole (PROTONIX) 40 MG tablet Take 40 mg by mouth daily.      . polycarbophil (FIBERCON) 625 MG tablet Take 625 mg by mouth daily.      . potassium chloride SA (K-DUR,KLOR-CON) 20 MEQ tablet Take 20 mEq by mouth daily.      . simvastatin (ZOCOR) 40 MG tablet Take 40 mg by mouth every evening.      . solifenacin (VESICARE) 10 MG tablet Take 10 mg by mouth daily.      . traMADol (ULTRAM) 50 MG tablet Take 50 mg by mouth as needed.      . colchicine 0.6 MG tablet Take 0.6 mg by mouth as needed.         INSTRUCTIONS GIVEN AND DISCUSSED:  See attached schedule   SPECIAL INSTRUCTIONS/FOLLOW-UP:  See  above.  I acknowledge that I have been informed and understand all the instructions given to me and received a copy. I do not have any more questions at this time, but understand that I may call the [facility] at [phone] during business hours should I have any further questions or need assistance in obtaining follow-up care.

## 2012-01-01 ENCOUNTER — Telehealth: Payer: Self-pay | Admitting: *Deleted

## 2012-01-01 NOTE — Telephone Encounter (Signed)
Made patient appointment for genetics on 02-10-2012 starting at 2:00pm

## 2012-01-04 ENCOUNTER — Other Ambulatory Visit (INDEPENDENT_AMBULATORY_CARE_PROVIDER_SITE_OTHER): Payer: Self-pay | Admitting: Surgery

## 2012-01-04 ENCOUNTER — Ambulatory Visit (HOSPITAL_BASED_OUTPATIENT_CLINIC_OR_DEPARTMENT_OTHER)
Admission: RE | Admit: 2012-01-04 | Discharge: 2012-01-04 | Disposition: A | Discharge status: Home / Self Care | Payer: Medicare Other | Source: Ambulatory Visit | Attending: Surgery | Admitting: Surgery

## 2012-01-04 ENCOUNTER — Ambulatory Visit
Admission: RE | Admit: 2012-01-04 | Discharge: 2012-01-04 | Disposition: A | Discharge status: Home / Self Care | Payer: Medicare Other | Source: Ambulatory Visit | Attending: Surgery | Admitting: Surgery

## 2012-01-04 ENCOUNTER — Ambulatory Visit (HOSPITAL_BASED_OUTPATIENT_CLINIC_OR_DEPARTMENT_OTHER): Payer: Medicare Other | Admitting: Anesthesiology

## 2012-01-04 ENCOUNTER — Encounter (HOSPITAL_BASED_OUTPATIENT_CLINIC_OR_DEPARTMENT_OTHER)
Admission: RE | Disposition: A | Discharge status: Home / Self Care | Payer: Self-pay | Source: Ambulatory Visit | Attending: Surgery

## 2012-01-04 ENCOUNTER — Encounter (HOSPITAL_BASED_OUTPATIENT_CLINIC_OR_DEPARTMENT_OTHER): Payer: Self-pay | Admitting: Anesthesiology

## 2012-01-04 ENCOUNTER — Ambulatory Visit (HOSPITAL_COMMUNITY)
Admission: RE | Admit: 2012-01-04 | Discharge: 2012-01-04 | Disposition: A | Discharge status: Home / Self Care | Payer: Medicare Other | Source: Ambulatory Visit | Attending: Surgery | Admitting: Surgery

## 2012-01-04 ENCOUNTER — Encounter (HOSPITAL_BASED_OUTPATIENT_CLINIC_OR_DEPARTMENT_OTHER): Payer: Self-pay | Admitting: *Deleted

## 2012-01-04 DIAGNOSIS — Z87891 Personal history of nicotine dependence: Secondary | ICD-10-CM | POA: Insufficient documentation

## 2012-01-04 DIAGNOSIS — C50912 Malignant neoplasm of unspecified site of left female breast: Secondary | ICD-10-CM

## 2012-01-04 DIAGNOSIS — N289 Disorder of kidney and ureter, unspecified: Secondary | ICD-10-CM | POA: Insufficient documentation

## 2012-01-04 DIAGNOSIS — Z90710 Acquired absence of both cervix and uterus: Secondary | ICD-10-CM | POA: Insufficient documentation

## 2012-01-04 DIAGNOSIS — D0590 Unspecified type of carcinoma in situ of unspecified breast: Secondary | ICD-10-CM

## 2012-01-04 DIAGNOSIS — I10 Essential (primary) hypertension: Secondary | ICD-10-CM | POA: Insufficient documentation

## 2012-01-04 DIAGNOSIS — C50519 Malignant neoplasm of lower-outer quadrant of unspecified female breast: Secondary | ICD-10-CM | POA: Insufficient documentation

## 2012-01-04 DIAGNOSIS — K219 Gastro-esophageal reflux disease without esophagitis: Secondary | ICD-10-CM | POA: Insufficient documentation

## 2012-01-04 DIAGNOSIS — Z9089 Acquired absence of other organs: Secondary | ICD-10-CM | POA: Insufficient documentation

## 2012-01-04 HISTORY — DX: Hyperlipidemia, unspecified: E78.5

## 2012-01-04 HISTORY — DX: Other specified postprocedural states: R11.2

## 2012-01-04 HISTORY — DX: Other specified postprocedural states: Z98.890

## 2012-01-04 HISTORY — DX: Complete loss of teeth, unspecified cause, unspecified class: Z97.2

## 2012-01-04 HISTORY — DX: Unspecified osteoarthritis, unspecified site: M19.90

## 2012-01-04 HISTORY — DX: Complete loss of teeth, unspecified cause, unspecified class: K08.109

## 2012-01-04 HISTORY — DX: Shortness of breath: R06.02

## 2012-01-04 HISTORY — DX: Gastro-esophageal reflux disease without esophagitis: K21.9

## 2012-01-04 HISTORY — DX: Presence of spectacles and contact lenses: Z97.3

## 2012-01-04 HISTORY — PX: BREAST LUMPECTOMY: SHX2

## 2012-01-04 SURGERY — BREAST LUMPECTOMY WITH NEEDLE LOCALIZATION AND AXILLARY SENTINEL LYMPH NODE BX
Anesthesia: General | Site: Breast | Laterality: Left | Wound class: Clean

## 2012-01-04 MED ORDER — EPHEDRINE SULFATE 50 MG/ML IJ SOLN
INTRAMUSCULAR | Status: DC | PRN
  Administered 2012-01-04: 10 mg via INTRAVENOUS

## 2012-01-04 MED ORDER — OXYCODONE HCL 5 MG/5ML PO SOLN: 5.0000 mg | Freq: Once | ORAL | Status: DC | PRN

## 2012-01-04 MED ORDER — LIDOCAINE HCL (CARDIAC) 20 MG/ML IV SOLN
INTRAVENOUS | Status: DC | PRN
  Administered 2012-01-04: 50 mg via INTRAVENOUS

## 2012-01-04 MED ORDER — CHLORHEXIDINE GLUCONATE 4 % EX LIQD: 1.0000 "application " | Freq: Once | CUTANEOUS | Status: DC

## 2012-01-04 MED ORDER — BUPIVACAINE HCL (PF) 0.25 % IJ SOLN
INTRAMUSCULAR | Status: DC | PRN
  Administered 2012-01-04: 30 mL

## 2012-01-04 MED ORDER — SCOPOLAMINE 1 MG/3DAYS TD PT72
1.0000 | MEDICATED_PATCH | Freq: Once | TRANSDERMAL | Status: DC
  Administered 2012-01-04: 1.5 mg via TRANSDERMAL

## 2012-01-04 MED ORDER — METOCLOPRAMIDE HCL 5 MG/ML IJ SOLN
INTRAMUSCULAR | Status: DC | PRN
  Administered 2012-01-04: 10 mg via INTRAVENOUS

## 2012-01-04 MED ORDER — CEFAZOLIN SODIUM-DEXTROSE 2-3 GM-% IV SOLR
2.0000 g | INTRAVENOUS | Status: AC
  Administered 2012-01-04: 2 g via INTRAVENOUS

## 2012-01-04 MED ORDER — LACTATED RINGERS IV SOLN
INTRAVENOUS | Status: DC
  Administered 2012-01-04: 09:00:00 via INTRAVENOUS

## 2012-01-04 MED ORDER — MIDAZOLAM HCL 2 MG/2ML IJ SOLN
1.0000 mg | INTRAMUSCULAR | Status: DC | PRN
  Administered 2012-01-04: 1 mg via INTRAVENOUS

## 2012-01-04 MED ORDER — SODIUM CHLORIDE 0.9 % IJ SOLN
INTRAMUSCULAR | Status: DC | PRN
  Administered 2012-01-04: 10:00:00 via INTRADERMAL

## 2012-01-04 MED ORDER — FENTANYL CITRATE 0.05 MG/ML IJ SOLN
50.0000 ug | INTRAMUSCULAR | Status: DC | PRN
  Administered 2012-01-04: 25 ug via INTRAVENOUS
  Administered 2012-01-04: 50 ug via INTRAVENOUS

## 2012-01-04 MED ORDER — ACETAMINOPHEN 10 MG/ML IV SOLN
1000.0000 mg | Freq: Once | INTRAVENOUS | Status: AC
  Administered 2012-01-04: 1000 mg via INTRAVENOUS

## 2012-01-04 MED ORDER — ONDANSETRON HCL 4 MG/2ML IJ SOLN
INTRAMUSCULAR | Status: DC | PRN
  Administered 2012-01-04: 4 mg via INTRAVENOUS

## 2012-01-04 MED ORDER — OXYCODONE HCL 5 MG PO TABS: 5.0000 mg | ORAL_TABLET | Freq: Once | ORAL | Status: DC | PRN

## 2012-01-04 MED ORDER — TECHNETIUM TC 99M SULFUR COLLOID FILTERED
1.0000 | Freq: Once | INTRAVENOUS | Status: AC | PRN
  Administered 2012-01-04: 1 via INTRADERMAL

## 2012-01-04 MED ORDER — OXYCODONE-ACETAMINOPHEN 5-325 MG PO TABS: 1.0000 | ORAL_TABLET | ORAL | Status: AC | PRN

## 2012-01-04 MED ORDER — FENTANYL CITRATE 0.05 MG/ML IJ SOLN
INTRAMUSCULAR | Status: DC | PRN
  Administered 2012-01-04 (×2): 25 ug via INTRAVENOUS

## 2012-01-04 MED ORDER — FENTANYL CITRATE 0.05 MG/ML IJ SOLN
25.0000 ug | INTRAMUSCULAR | Status: DC | PRN
  Administered 2012-01-04 (×2): 25 ug via INTRAVENOUS

## 2012-01-04 MED ORDER — PROPOFOL 10 MG/ML IV BOLUS
INTRAVENOUS | Status: DC | PRN
  Administered 2012-01-04: 200 mg via INTRAVENOUS

## 2012-01-04 MED ORDER — METOCLOPRAMIDE HCL 5 MG/ML IJ SOLN: 10.0000 mg | Freq: Once | INTRAMUSCULAR | Status: DC | PRN

## 2012-01-04 SURGICAL SUPPLY — 72 items
ADH SKN CLS APL DERMABOND .7 (GAUZE/BANDAGES/DRESSINGS) ×2
APL SKNCLS STERI-STRIP NONHPOA (GAUZE/BANDAGES/DRESSINGS) ×1
APPLIER CLIP 11 MED OPEN (CLIP)
APPLIER CLIP 9.375 MED OPEN (MISCELLANEOUS)
APR CLP MED 11 20 MLT OPN (CLIP)
APR CLP MED 9.3 20 MLT OPN (MISCELLANEOUS)
BENZOIN TINCTURE PRP APPL 2/3 (GAUZE/BANDAGES/DRESSINGS) ×1 IMPLANT
BINDER BREAST LRG (GAUZE/BANDAGES/DRESSINGS) ×1 IMPLANT
BLADE HEX COATED 2.75 (ELECTRODE) ×2 IMPLANT
BLADE SURG 15 STRL LF DISP TIS (BLADE) ×2 IMPLANT
BLADE SURG 15 STRL SS (BLADE) ×4
CANISTER SUCTION 1200CC (MISCELLANEOUS) ×2 IMPLANT
CHLORAPREP W/TINT 26ML (MISCELLANEOUS) ×2 IMPLANT
CLIP APPLIE 11 MED OPEN (CLIP) IMPLANT
CLIP APPLIE 9.375 MED OPEN (MISCELLANEOUS) IMPLANT
CLIP TI MEDIUM 6 (CLIP) IMPLANT
CLIP TI WIDE RED SMALL 6 (CLIP) ×3 IMPLANT
CLOTH BEACON ORANGE TIMEOUT ST (SAFETY) ×2 IMPLANT
COVER MAYO STAND STRL (DRAPES) ×2 IMPLANT
COVER PROBE 5X48 (MISCELLANEOUS)
COVER PROBE W GEL 5X96 (DRAPES) ×2 IMPLANT
COVER TABLE BACK 60X90 (DRAPES) ×2 IMPLANT
DECANTER SPIKE VIAL GLASS SM (MISCELLANEOUS) IMPLANT
DERMABOND ADVANCED (GAUZE/BANDAGES/DRESSINGS) ×2
DERMABOND ADVANCED .7 DNX12 (GAUZE/BANDAGES/DRESSINGS) ×2 IMPLANT
DEVICE DUBIN W/COMP PLATE 8390 (MISCELLANEOUS) ×1 IMPLANT
DRAIN CHANNEL 19F RND (DRAIN) IMPLANT
DRAPE LAPAROSCOPIC ABDOMINAL (DRAPES) ×2 IMPLANT
DRAPE SURG 17X23 STRL (DRAPES) ×2 IMPLANT
DRAPE UTILITY XL STRL (DRAPES) ×2 IMPLANT
DRSG EMULSION OIL 3X3 NADH (GAUZE/BANDAGES/DRESSINGS) ×2 IMPLANT
ELECT BLADE 4.0 EZ CLEAN MEGAD (MISCELLANEOUS)
ELECT REM PT RETURN 9FT ADLT (ELECTROSURGICAL) ×2
ELECTRODE BLDE 4.0 EZ CLN MEGD (MISCELLANEOUS) IMPLANT
ELECTRODE REM PT RTRN 9FT ADLT (ELECTROSURGICAL) ×1 IMPLANT
EVACUATOR SILICONE 100CC (DRAIN) IMPLANT
GAUZE SPONGE 4X4 12PLY STRL LF (GAUZE/BANDAGES/DRESSINGS) ×1 IMPLANT
GLOVE BIOGEL PI IND STRL 7.0 (GLOVE) IMPLANT
GLOVE BIOGEL PI INDICATOR 7.0 (GLOVE) ×1
GLOVE ECLIPSE 6.5 STRL STRAW (GLOVE) ×1 IMPLANT
GLOVE EUDERMIC 7 POWDERFREE (GLOVE) ×2 IMPLANT
GOWN PREVENTION PLUS XLARGE (GOWN DISPOSABLE) ×4 IMPLANT
KIT CVR 48X5XPRB PLUP LF (MISCELLANEOUS) IMPLANT
KIT MARKER MARGIN INK (KITS) ×1 IMPLANT
NDL HYPO 25X1 1.5 SAFETY (NEEDLE) ×2 IMPLANT
NDL SAFETY ECLIPSE 18X1.5 (NEEDLE) ×1 IMPLANT
NEEDLE HYPO 18GX1.5 SHARP (NEEDLE) ×2
NEEDLE HYPO 25X1 1.5 SAFETY (NEEDLE) ×4 IMPLANT
NS IRRIG 1000ML POUR BTL (IV SOLUTION) ×2 IMPLANT
PACK BASIN DAY SURGERY FS (CUSTOM PROCEDURE TRAY) ×2 IMPLANT
PENCIL BUTTON HOLSTER BLD 10FT (ELECTRODE) ×2 IMPLANT
PIN SAFETY STERILE (MISCELLANEOUS) IMPLANT
SHEET MEDIUM DRAPE 40X70 STRL (DRAPES) ×2 IMPLANT
SLEEVE SCD COMPRESS KNEE MED (MISCELLANEOUS) ×2 IMPLANT
SLEEVE SURGEON STRL (DRAPES) ×1 IMPLANT
SPONGE GAUZE 4X4 12PLY (GAUZE/BANDAGES/DRESSINGS) IMPLANT
SPONGE INTESTINAL PEANUT (DISPOSABLE) IMPLANT
SPONGE LAP 18X18 X RAY DECT (DISPOSABLE) IMPLANT
SPONGE LAP 4X18 X RAY DECT (DISPOSABLE) ×3 IMPLANT
STAPLER VISISTAT 35W (STAPLE) IMPLANT
STRIP CLOSURE SKIN 1/2X4 (GAUZE/BANDAGES/DRESSINGS) ×1 IMPLANT
SUT ETHILON 2 0 FS 18 (SUTURE) IMPLANT
SUT ETHILON 3 0 FSL (SUTURE) IMPLANT
SUT MNCRL AB 4-0 PS2 18 (SUTURE) ×4 IMPLANT
SUT VIC AB 4-0 BRD 54 (SUTURE) IMPLANT
SUT VICRYL 3-0 CR8 SH (SUTURE) ×4 IMPLANT
SYR CONTROL 10ML LL (SYRINGE) ×4 IMPLANT
TOWEL OR 17X24 6PK STRL BLUE (TOWEL DISPOSABLE) ×2 IMPLANT
TOWEL OR NON WOVEN STRL DISP B (DISPOSABLE) ×2 IMPLANT
TUBE CONNECTING 20X1/4 (TUBING) ×2 IMPLANT
WATER STERILE IRR 1000ML POUR (IV SOLUTION) ×1 IMPLANT
YANKAUER SUCT BULB TIP NO VENT (SUCTIONS) ×2 IMPLANT

## 2012-01-04 NOTE — Anesthesia Preprocedure Evaluation (Signed)
Anesthesia Evaluation  Patient identified by MRN, date of birth, ID band Patient awake    Reviewed: Allergy & Precautions, H&P , NPO status , Patient's Chart, lab work & pertinent test results, reviewed documented beta blocker date and time   History of Anesthesia Complications (+) PONV  Airway Mallampati: II TM Distance: >3 FB Neck ROM: full    Dental   Pulmonary shortness of breath and with exertion,  breath sounds clear to auscultation        Cardiovascular hypertension, Rhythm:regular     Neuro/Psych negative neurological ROS  negative psych ROS   GI/Hepatic Neg liver ROS, GERD-  Medicated and Controlled,  Endo/Other  negative endocrine ROS  Renal/GU Renal InsufficiencyRenal disease  negative genitourinary   Musculoskeletal   Abdominal   Peds  Hematology negative hematology ROS (+)   Anesthesia Other Findings See surgeon's H&P   Reproductive/Obstetrics negative OB ROS                           Anesthesia Physical Anesthesia Plan  ASA: II  Anesthesia Plan: General   Post-op Pain Management:    Induction: Intravenous  Airway Management Planned: LMA  Additional Equipment:   Intra-op Plan:   Post-operative Plan: Extubation in OR  Informed Consent: I have reviewed the patients History and Physical, chart, labs and discussed the procedure including the risks, benefits and alternatives for the proposed anesthesia with the patient or authorized representative who has indicated his/her understanding and acceptance.   Dental Advisory Given  Plan Discussed with: CRNA and Surgeon  Anesthesia Plan Comments:         Anesthesia Quick Evaluation

## 2012-01-04 NOTE — Op Note (Signed)
Melissa Morton  November 02, 1933  [id number]  01/04/2012   Preoperative diagnosis: Left breast cancer, Invasive ductal, clinical stage I, lower outer quadrant.  Postoperative diagnosis: Same  Procedure: Wire localized left partial mastectomy with blue dye injectio and sentinel node removal  Surgeon: [name], MD, FACS  Anesthesia: General  Clinical History and Indications: this patient presents for a guidewire localized excision of a left breast cancer.  Description of procedure: The patient was seen in the holding area and the plans for the procedure reviewed. The left breast was marked as the operative side. The wire localizing films were reviewed.  The patient was taken to the operating room and after satisfactory general anesthesia had been obtained the timeout was performed. The left breast was injected with 5 cc dilute methylenee blue and then the breast was prepped and draped   The incision was made over the area of the mass which was just above the inframammary fold. I took an ellipse of skin around the area marked by the radiologist and encompassing the guide wire. Skin flaps were raised and the mass removed taking care to achieve margins all around and gong to and including the fascia deep. After achieving hemostasis, the incision was closed with 3-0 Vicryl, 4-0 Monocryl subcuticular, and Dermabond.20 cc of 0.25% marcaine was injected to help with post op pain relief.  The sentinel node was then done. Using the Neoprobe a hot area was identified and a transverse incision made. The axillary fat was entered and a bright blue 1 cm lymph node was identified and removed along with an adjacent node that initially appeared to have radioactivity. When the two were separated however, the second node had no counts. A second hot area was identified and a second blue hot node removed, this one only about 2mm in size. With removal of that node, no other hot areas, blue areas, or palpably  abnormal areas were found.  I put 10cc of marcaine here and closed in layers with 3-0 Vicryl, 4-0 Monocryl and Dermabond. The patient tolerated the procedure well. There were no operative complications. All counts were correct.   EBL: Minimal  [name], MD, FACS 01/04/2012 11:13 AM

## 2012-01-04 NOTE — Transfer of Care (Signed)
Immediate Anesthesia Transfer of Care Note  Patient: Melissa Morton  Procedure(s) Performed: Procedure(s) (LRB) with comments: BREAST LUMPECTOMY WITH NEEDLE LOCALIZATION AND AXILLARY SENTINEL LYMPH NODE BX (Left)  Patient Location: PACU  Anesthesia Type: General  Level of Consciousness: sedated  Airway & Oxygen Therapy: Patient Spontanous Breathing and Patient connected to face mask oxygen  Post-op Assessment: Report given to PACU RN and Post -op Vital signs reviewed and stable  Post vital signs: Reviewed and stable  Complications: No apparent anesthesia complications

## 2012-01-04 NOTE — Interval H&P Note (Signed)
History and Physical Interval Note:  01/04/2012 9:49 AM  Melissa Morton  has presented today for surgery, with the diagnosis of left breast cancer  The various methods of treatment have been discussed with the patient and family. After consideration of risks, benefits and other options for treatment, the patient has consented to  Procedure(s) (LRB): BREAST LUMPECTOMY WITH NEEDLE LOCALIZATION AND AXILLARY SENTINEL LYMPH NODE BX (Left) as a surgical intervention .  The patient's history has been reviewed, patient examined, no change in status, stable for surgery.  I have reviewed the patient's chart and labs.  Questions were answered to the patient's satisfaction.   I have marked the left breast as the operative side and reviewed the wire localization films  Karrie Fluellen J

## 2012-01-04 NOTE — Anesthesia Postprocedure Evaluation (Signed)
Anesthesia Post Note  Patient: Melissa Morton  Procedure(s) Performed: Procedure(s) (LRB): BREAST LUMPECTOMY WITH NEEDLE LOCALIZATION AND AXILLARY SENTINEL LYMPH NODE BX (Left)  Anesthesia type: General  Patient location: PACU  Post pain: Pain level controlled  Post assessment: Patient's Cardiovascular Status Stable  Last Vitals:  Filed Vitals:   01/04/12 1225  BP:   Pulse: 68  Temp:   Resp: 16    Post vital signs: Reviewed and stable  Level of consciousness: alert  Complications: No apparent anesthesia complications

## 2012-01-04 NOTE — H&P (View-Only) (Signed)
Patient ID: Melissa Morton, female   DOB: 06/18/1933, 78 y.o.   MRN: [id number]  Chief Complaint  Patient presents with  . Breast Cancer    left    HPI Melissa Morton is a 78 y.o. female.  She was being evaluated for an elevated white cell count. As part of the evaluation a CT scan of the chest was done. A suspicious area was found in the left breast. A core biopsy had shown invasive ductal carcinoma, receptor-positive, HER-2 equivocal. The patient is having no breast symptoms and was unaware of her breast mass. Etiology of her elevated white count is not clear but it seems to have been returning back towards normal. She comes to me today for evaluation of her breast cancer. Of note is that her sister had breast cancer when she was in her 50s. This is the only family history breast cancer and there is no family history of ovarian cancer HPI  Past Medical History  Diagnosis Date  . Reflux   . Diverticulitis   . Hypertension   . Renal insufficiency 11/09/2011  . Breast cancer, left breast 12/07/2011  . Breast cancer, left breast 12/07/2011    Past Surgical History  Procedure Date  . Cholecystectomy   . Abdominal hysterectomy   . Knee surgery     History reviewed. No pertinent family history.  Social History History  Substance Use Topics  . Smoking status: Former Smoker -- 0.2 packs/day for 10 years    Types: Cigarettes    Quit date: 11/09/1991  . Smokeless tobacco: Never Used  . Alcohol Use: Yes     occ    Allergies  Allergen Reactions  . Hydromorphone Hcl Nausea And Vomiting  . Nsaids     11/09/11- Pt has renal issues.  . Prednisone Nausea And Vomiting    Current Outpatient Prescriptions  Medication Sig Dispense Refill  . albuterol (PROVENTIL) (2.5 MG/3ML) 0.083% nebulizer solution Take 2.5 mg by nebulization as needed.      . alendronate (FOSAMAX) 70 MG tablet Take 70 mg by mouth every 7 (seven) days. On wednesday.   Take with a full glass of water on an empty stomach.       . allopurinol (ZYLOPRIM) 300 MG tablet Take 300 mg by mouth daily.      . aspirin EC 81 MG tablet Take 81 mg by mouth daily.      . calcium-vitamin D (OSCAL WITH D) 500-200 MG-UNIT per tablet Take 1 tablet by mouth 2 (two) times daily.      . colchicine 0.6 MG tablet Take 0.6 mg by mouth as needed.      . furosemide (LASIX) 40 MG tablet Take 40 mg by mouth daily.      . pantoprazole (PROTONIX) 40 MG tablet Take 40 mg by mouth daily.      . polycarbophil (FIBERCON) 625 MG tablet Take 625 mg by mouth daily.      . potassium chloride SA (K-DUR,KLOR-CON) 20 MEQ tablet Take 20 mEq by mouth daily.      . simvastatin (ZOCOR) 40 MG tablet Take 40 mg by mouth every evening.      . solifenacin (VESICARE) 10 MG tablet Take 10 mg by mouth daily.      . traMADol (ULTRAM) 50 MG tablet Take 50 mg by mouth as needed.      . nystatin (MYCOSTATIN) 100000 UNIT/ML suspension Take 100,000 Units by mouth 4 times daily.          Review of Systems Review of Systems  Constitutional: Negative for fever, chills and unexpected weight change.  HENT: Positive for hearing loss. Negative for congestion, sore throat, trouble swallowing and voice change.   Eyes: Negative for visual disturbance.  Respiratory: Negative for cough and wheezing.   Cardiovascular: Negative for chest pain, palpitations and leg swelling.  Gastrointestinal: Negative for nausea, vomiting, abdominal pain, diarrhea, constipation, blood in stool, abdominal distention and anal bleeding.  Genitourinary: Negative for hematuria, vaginal bleeding and difficulty urinating.  Musculoskeletal: Negative for arthralgias.  Skin: Negative for rash and wound.  Neurological: Negative for seizures, syncope and headaches.  Hematological: Negative for adenopathy. Does not bruise/bleed easily.  Psychiatric/Behavioral: Negative for confusion.    Blood pressure 142/64, pulse 76, temperature 97.5 F (36.4 C), temperature source Temporal, resp. rate 20, height 5' 4"  (1.626 m), weight 158 lb 12.8 oz (72.031 kg).  Physical Exam Physical Exam  Vitals reviewed. Constitutional: She is oriented to person, place, and time. She appears well-developed and well-nourished. No distress.  HENT:  Head: Normocephalic and atraumatic.  Mouth/Throat: Oropharynx is clear and moist.  Eyes: Conjunctivae and EOM are normal. Pupils are equal, round, and reactive to light. No scleral icterus.  Neck: Normal range of motion. Neck supple. No tracheal deviation present. No thyromegaly present.  Cardiovascular: Normal rate, regular rhythm, normal heart sounds and intact distal pulses.  Exam reveals no gallop and no friction rub.   No murmur heard. Pulmonary/Chest: Effort normal and breath sounds normal. No respiratory distress. She has no wheezes. She has no rales.         Ecchymotic area as diagrammed no dominant mass  Abdominal: Soft. Bowel sounds are normal. She exhibits no distension and no mass. There is no tenderness. There is no rebound and no guarding.  Musculoskeletal: Normal range of motion. She exhibits no edema and no tenderness.  Neurological: She is alert and oriented to person, place, and time.  Skin: Skin is warm and dry. No rash noted. She is not diaphoretic. No erythema.  Psychiatric: She has a normal mood and affect. Her behavior is normal. Judgment and thought content normal.    Data Reviewed Mammogram films and reports, scans et cetera, with chronic medical record chart, pathology report and films reviewed with the pathologist and radiologist  Assessment    Clinical stage I left breast cancer lower outer quadrant    Plan    I have explained the pathophysiology and staging of breast cancer with particular attention to her exact situation. We discussed the multidisciplinary approach to breast cancer which often includes both medical and radiation oncology consultations.  We also discussed surgical options for the treatment of breast cancer including  lumpectomy and mastectomy with possible reconstructive surgery. In addition we talked about the evaluation and management of lymph nodes including a description of sentinel lymph node biopsy and axillary dissections. We reviewed potential complications and risks including bleeding, infection, numbness,  lymphedema, and the potential need for additional surgery.  She understands that for patients who are candidate for lumpectomy or mastectomy there is an equal survival rate with either technique, but a slightly higher local recurrence rate with lumpectomy. In addition she knows that a lumpectomy usually requires postoperative radiation as part of the management of the breast cancer.  We have discussed the likely postoperative course and plans for followup.  I have given the patient some written information that reviewed all of these issues. I believe her questions are answered and that she has a   good understanding of the issues.  The patient is an excellent candidate for lumpectomy with sentinel node evaluation and that was the recommendation of the breast multidisciplinary conference. We'll go ahead and make arrangements to get this scheduled for her. I think all questions have been answered.        Lyrah Bradt J 12/07/2011, 5:38 PM    

## 2012-01-04 NOTE — Anesthesia Procedure Notes (Signed)
Procedure Name: LMA Insertion Performed by: Nicolis Boody W Pre-anesthesia Checklist: Patient identified, Timeout performed, Emergency Drugs available, Suction available and Patient being monitored Patient Re-evaluated:Patient Re-evaluated prior to inductionOxygen Delivery Method: Circle system utilized Preoxygenation: Pre-oxygenation with 100% oxygen Intubation Type: IV induction Ventilation: Mask ventilation without difficulty LMA: LMA inserted LMA Size: 4.0 Number of attempts: 1 Placement Confirmation: breath sounds checked- equal and bilateral and positive ETCO2 Tube secured with: Tape Dental Injury: Teeth and Oropharynx as per pre-operative assessment      

## 2012-01-05 ENCOUNTER — Telehealth (INDEPENDENT_AMBULATORY_CARE_PROVIDER_SITE_OTHER): Payer: Self-pay | Admitting: General Surgery

## 2012-01-05 NOTE — Telephone Encounter (Signed)
Message copied by Liliana Cline on Wed Jan 05, 2012  1:41 PM ------      Message from: [name]      Created: Wed Jan 05, 2012  1:29 PM       Tell the patient that her margins are OK and her lymph nodes are negative. I will discuss in detail in the office.

## 2012-01-05 NOTE — Telephone Encounter (Signed)
Patient aware path results are good. Lymph nodes negative and margins ok. She will follow up in the office at her scheduled appt and call with any questions prior.  

## 2012-01-17 ENCOUNTER — Telehealth: Payer: Self-pay | Admitting: Hematology and Oncology

## 2012-01-17 NOTE — Telephone Encounter (Signed)
Moved 10/10 genetics appt to 10/17 due to Clydie Braun out. S/w pt she is aware.

## 2012-01-20 ENCOUNTER — Telehealth: Payer: Self-pay | Admitting: *Deleted

## 2012-01-20 NOTE — Telephone Encounter (Signed)
Received call from [facility], breast navigator re:  Per Dr. Tenna Child message, pt is ready to see oncologist again post op.   Message to md for 01/21/12.

## 2012-01-21 ENCOUNTER — Other Ambulatory Visit: Payer: Self-pay | Admitting: *Deleted

## 2012-01-24 ENCOUNTER — Encounter (INDEPENDENT_AMBULATORY_CARE_PROVIDER_SITE_OTHER): Payer: Self-pay | Admitting: Surgery

## 2012-01-24 ENCOUNTER — Ambulatory Visit (INDEPENDENT_AMBULATORY_CARE_PROVIDER_SITE_OTHER): Payer: Medicare Other | Admitting: Surgery

## 2012-01-24 VITALS — BP 140/82 | HR 80 | Resp 20 | Ht 64.0 in | Wt 158.0 lb

## 2012-01-24 DIAGNOSIS — C50919 Malignant neoplasm of unspecified site of unspecified female breast: Secondary | ICD-10-CM

## 2012-01-24 DIAGNOSIS — C50912 Malignant neoplasm of unspecified site of left female breast: Secondary | ICD-10-CM

## 2012-01-24 NOTE — Progress Notes (Signed)
Melissa Morton    [id number] 01/24/2012    1933/09/09   CC: Post op left lumpectomy  HPI: The patient returns for post op follow-up. She underwent a NL left lumpectomy on on 01/04/12 . Over all she feels that she is doing well.   PE: VITAL SIGNS: BP 140/82  Pulse 80  Resp 20  Ht 5\' 4"  (1.626 m)  Wt 158 lb (71.668 kg)  BMI 27.12 kg/m2  The incision is healing nicely and there is no evidence of infection or hematoma.    DATA REVIEWED: Pathology report showed IDC, 1.3 cm, Neg margin, neg SLN, LVI+  IMPRESSION: Patient doing well. Needs oncology f/u  PLAN: Her next visit will be in one month.

## 2012-01-24 NOTE — Patient Instructions (Signed)
See me again in about a month 

## 2012-01-26 ENCOUNTER — Telehealth: Payer: Self-pay | Admitting: Hematology and Oncology

## 2012-01-26 NOTE — Telephone Encounter (Signed)
S/w pt re appt for 9/30 w/Dr. Roselind Messier and 10/10 w/LO. Pt was already on schedule to see Dr. Roselind Messier and per LO appt w/Dr. Roselind Messier ok.

## 2012-01-31 ENCOUNTER — Ambulatory Visit
Admission: RE | Admit: 2012-01-31 | Discharge: 2012-01-31 | Disposition: A | Discharge status: Home / Self Care | Payer: Medicare Other | Source: Ambulatory Visit | Attending: Radiation Oncology | Admitting: Radiation Oncology

## 2012-01-31 ENCOUNTER — Encounter: Payer: Self-pay | Admitting: *Deleted

## 2012-01-31 ENCOUNTER — Encounter: Payer: Self-pay | Admitting: Radiation Oncology

## 2012-01-31 VITALS — BP 172/82 | HR 75 | Temp 97.7°F | Resp 18 | Ht 64.0 in | Wt 163.7 lb

## 2012-01-31 DIAGNOSIS — Z87891 Personal history of nicotine dependence: Secondary | ICD-10-CM | POA: Insufficient documentation

## 2012-01-31 DIAGNOSIS — Z7982 Long term (current) use of aspirin: Secondary | ICD-10-CM | POA: Insufficient documentation

## 2012-01-31 DIAGNOSIS — E785 Hyperlipidemia, unspecified: Secondary | ICD-10-CM | POA: Insufficient documentation

## 2012-01-31 DIAGNOSIS — Z79899 Other long term (current) drug therapy: Secondary | ICD-10-CM | POA: Insufficient documentation

## 2012-01-31 DIAGNOSIS — M7989 Other specified soft tissue disorders: Secondary | ICD-10-CM | POA: Insufficient documentation

## 2012-01-31 DIAGNOSIS — C50912 Malignant neoplasm of unspecified site of left female breast: Secondary | ICD-10-CM

## 2012-01-31 DIAGNOSIS — Z510 Encounter for antineoplastic radiation therapy: Secondary | ICD-10-CM | POA: Insufficient documentation

## 2012-01-31 DIAGNOSIS — C50519 Malignant neoplasm of lower-outer quadrant of unspecified female breast: Secondary | ICD-10-CM | POA: Insufficient documentation

## 2012-01-31 DIAGNOSIS — K219 Gastro-esophageal reflux disease without esophagitis: Secondary | ICD-10-CM | POA: Insufficient documentation

## 2012-01-31 DIAGNOSIS — I10 Essential (primary) hypertension: Secondary | ICD-10-CM | POA: Insufficient documentation

## 2012-01-31 DIAGNOSIS — Z170 Estrogen receptor positive status [ER+]: Secondary | ICD-10-CM | POA: Insufficient documentation

## 2012-01-31 HISTORY — DX: Unspecified hearing loss, unspecified ear: H91.90

## 2012-01-31 NOTE — Addendum Note (Signed)
Encounter addended by: Bohdan Macho Mintz Lucianne Smestad, RN on: 01/31/2012  7:15 PM<BR>     Documentation filed: Charges VN

## 2012-01-31 NOTE — Progress Notes (Signed)
Complete PATIENT MEASURE OF DISTRESS worksheet with a score of 0 submitted to social work.  

## 2012-01-31 NOTE — Addendum Note (Signed)
Encounter addended by: Delynn Flavin, RN on: 01/31/2012  7:04 PM<BR>     Documentation filed: Charges VN

## 2012-01-31 NOTE — Addendum Note (Signed)
Encounter addended by: Naveah Brave Mintz Laurell Coalson, RN on: 01/31/2012  7:15 PM<BR>     Documentation filed: Charges VN

## 2012-01-31 NOTE — Progress Notes (Signed)
Received patient in the clinic today for consult with Dr. Roselind Messier to discuss the role of radiation therapy in the treatment of breast ca. Patient accompanied today by her daughter. Patient is alert and oriented to person, place, and time. No distress noted. Steady gait noted. Pleasant affect noted. Patient denies breast pain. Patient reports an occasional twinge in her left breast but, understand this is related to effect of breast surgery and nerve manipulation. Patient denies nipple discharge. Patient reports that breast incisions are well approximated without redness, drainage or edema. Patient denies nausea, vomiting, headache, or dizziness. Patient reports a good appetite and stable weight. Patient denies difficulty sleeping. Patient reports a normal energy level. Patient demonstrates full ROM of left arm. Also, patient denies numbness or edema in left arm. Reported all findings to Dr. Roselind Messier.   No hx of radiation therapy  Denies having a pacemaker

## 2012-01-31 NOTE — Progress Notes (Signed)
See progress note under physician encounter. 

## 2012-01-31 NOTE — Progress Notes (Signed)
Radiation Oncology         [phone] ________________________________  Initial outpatient Consultation  Name: Melissa Morton MRN: [id number]  Date: 01/31/2012  DOB: 1933/11/23  WU:[id number], Chrissie Noa, MD  Streck, Reola Mosher, MD   REFERRING PHYSICIAN: [name], MD  DIAGNOSIS: The encounter diagnosis was Breast cancer, left breast.  HISTORY OF PRESENT ILLNESS::Melissa Morton is a 78 y.o. female who is seen at the courtesy of Dr. Jamey Ripa for an opinion concerning radiation therapy as part management of patient's recent diagnosis left breast cancer.  Earlier this year the patient was being evaluated for elevated white count. As part of this evaluation a CT scan of the chest was performed which revealed a mass within the left breast area.  A digital diagnostic mammogram confirmed a mass in the lower outer quadrant of left breast. She proceeded to undergo biopsy of this area with pathology significant for invasive mammary carcinoma. The tumor was estrogen receptor positive at 100% and progesterone receptor positive at 100%. Patient was seen by Dr. Jamey Ripa and was felt to be a good candidate for breast conservation therapy. On 01/04/2012 the patient underwent partial mastectomy and sentinel node procedure this was found to have a 1.3 cm invasive ductal carcinoma, grade 2 within the lower outer quadrant of left breast. There was lymphovascular space invasion as well as perineural invasion. In addition the inferior margin was close at 2 mm. This was found to have 3 benign lymph nodes within the left sentinel node specimen. She has been recovering well from her surgery   PREVIOUS RADIATION THERAPY: No  PAST MEDICAL HISTORY:  has a past medical history of Reflux; Diverticulitis; Hypertension; Hyperlipemia; Shortness of breath; GERD (gastroesophageal reflux disease); PONV (postoperative nausea and vomiting); Wears glasses; Full dentures; Breast cancer, left breast (12/07/2011); Breast cancer, left  breast (11/24/11); Renal insufficiency (11/09/2011); Arthritis; Allergy; and HOH (hard of hearing).    PAST SURGICAL HISTORY: Past Surgical History  Procedure Date  . Knee surgery 2000    rt   . Cholecystectomy   . Tubal ligation   . Bladder suspension   . Tonsillectomy   . Fracture surgery     lt foot  . Eye surgery     both cataracts  . Breast lumpectomy 01/04/2012    left,invasive ductcl ca, grade II,lloq, lymphovascular and perineural invaion ,DCIS,  . Abdominal hysterectomy 93    total    FAMILY HISTORY: family history includes Cancer in her sister.  SOCIAL HISTORY:  reports that she quit smoking about 20 years ago. Her smoking use included Cigarettes. She has a 2.5 pack-year smoking history. She has never used smokeless tobacco. She reports that she drinks alcohol. She reports that she does not use illicit drugs.  ALLERGIES: Hydromorphone hcl; Nsaids; and Prednisone  MEDICATIONS:  Current Outpatient Prescriptions  Medication Sig Dispense Refill  . albuterol (PROVENTIL) (2.5 MG/3ML) 0.083% nebulizer solution Take 2.5 mg by nebulization as needed.      Marland Kitchen alendronate (FOSAMAX) 70 MG tablet Take 70 mg by mouth every 7 (seven) days. On wednesday.   Take with a full glass of water on an empty stomach.      Marland Kitchen allopurinol (ZYLOPRIM) 300 MG tablet Take 300 mg by mouth daily.      Marland Kitchen aspirin EC 81 MG tablet Take 81 mg by mouth daily.      . calcium-vitamin D (OSCAL WITH D) 500-200 MG-UNIT per tablet Take 1 tablet by mouth 2 (two) times daily.      Marland Kitchen  colchicine 0.6 MG tablet Take 0.6 mg by mouth as needed.      . furosemide (LASIX) 40 MG tablet Take 40 mg by mouth daily.      Marland Kitchen losartan (COZAAR) 100 MG tablet Take 100 mg by mouth daily.      . pantoprazole (PROTONIX) 40 MG tablet Take 40 mg by mouth daily.      . polycarbophil (FIBERCON) 625 MG tablet Take 625 mg by mouth daily.      . potassium chloride SA (K-DUR,KLOR-CON) 20 MEQ tablet Take 20 mEq by mouth daily.      . simvastatin  (ZOCOR) 40 MG tablet Take 40 mg by mouth every evening.      . solifenacin (VESICARE) 10 MG tablet Take 10 mg by mouth daily.      . traMADol (ULTRAM) 50 MG tablet Take 50 mg by mouth as needed.        REVIEW OF SYSTEMS:  A 15 point review of systems is documented in the electronic medical record. This was obtained by the nursing staff. However, I reviewed this with the patient to discuss relevant findings and make appropriate changes.  She denies any pain in the left breast area nipple discharge or bleeding prior to diagnosis. Patient denies any new bony pain headaches dizziness or blurred vision. She has had a cyst removed from left breast in the remote past.   PHYSICAL EXAM:  height is 5\' 4"  (1.626 m) and weight is 163 lb 11.2 oz (74.254 kg). Her oral temperature is 97.7 F (36.5 C). Her blood pressure is 172/82 and her pulse is 75. Her respiration is 18.  the pupils are equal round reactive to light. The extraocular eye movements are intact. The tongue is midline. There is no secondary infection noted the oral cavity or posterior pharynx. Examination of the neck and supraclavicular region reveals no evidence of adenopathy. The axillary areas are free of adenopathy. Examination of the lungs with some to be clear. The heart has a regular rhythm and rate. Summation right breast reveals no mass or nipple discharge. Examination of the left breast area reveals a lumpectomy scar in the lower outer quadrant which is healing well. This is adjacent to the inframammary fold. Patient has a separate scar in the axillary region from her sentinel node procedure which is also healing well. She has a faint scar in the upper breast region from her prior cyst removal. Summation the abdomen reveals to be soft and nontender with normal bowel sounds. There is no obvious hepatosplenomegaly. On neurological examination motor strength is 5 out of 5 in the proximal distal muscle groups in the upper lower extremities. Peripheral  pulses are good. There is no cyanosis  clubbing or edema noted in the extremities.   LABORATORY DATA:  Lab Results  Component Value Date   WBC 7.8 12/30/2011   HGB 12.0 12/30/2011   HCT 37.4 12/30/2011   MCV 87.6 12/30/2011   PLT 273 12/30/2011   Lab Results  Component Value Date   NA 141 12/30/2011   K 5.1 12/30/2011   CL 105 12/30/2011   CO2 28 12/30/2011   Lab Results  Component Value Date   ALT 10 12/30/2011   AST 16 12/30/2011   ALKPHOS 75 12/30/2011   BILITOT 0.50 12/30/2011     RADIOGRAPHY: Nm Sentinel Node Inj-no Rpt (breast)  01/04/2012  CLINICAL DATA: Left breast cancer   Sulfur colloid was injected intradermally by the nuclear medicine  technologist for breast cancer  sentinel node localization.     [place] Wire Localization Left  01/19/2012  **ADDENDUM** CREATED: 01/19/2012 07:20:04  The second sentence of the first paragraph of this report should read:  The patient and I discussed the procedure of needle localization including benefits and alternatives.  The second sentence of the second paragraph of this report should read:  Films were labeled and sent with the patient to surgery.  Addended by:  Cain Saupe, M.D. on 01/19/2012 07:20:04.  **END ADDENDUM** SIGNED BY: Cain Saupe, M.D.   01/04/2012  *RADIOLOGY REPORT*  Clinical Data:  Left lower outer quadrant breast cancer  LEFT BREAST NEEDLE LOCALIZATION USING ULTRASOUND GUIDANCE AND SPECIMEN RADIOGRAPH  Patient presents for needle localization prior to surgical excision. The patient denied a discussed the procedure of needle localization including benefits and alternatives. We discussed the high likelihood of a successful procedure. We discussed the risks of the procedure, including infection, bleeding, tissue injury, and further surgery. Informed written consent was given.  Using ultrasound guidance, sterile technique, 2% lidocaine, and a 7 cm InRad Ultrawire needle, the mass at 4 o'clock 12 cm from the left nipple was localized  using a lateromedial approach. Films were labeled and sent with the patient surgery.  She tolerated the procedure well.  Specimen radiograph is performed at [facility] and confirms the mass, clip and wire to be present in the tissue sample.  The specimen is marked for pathology.  IMPRESSION: Needle localization left breast.  No apparent complications.   Original Report Authenticated By: Daryl Eastern, M.D.    Mm Breast Surgical Specimen  01/19/2012  **ADDENDUM** CREATED: 01/19/2012 07:20:04  The second sentence of the first paragraph of this report should read:  The patient and I discussed the procedure of needle localization including benefits and alternatives.  The second sentence of the second paragraph of this report should read:  Films were labeled and sent with the patient to surgery.  Addended by:  Cain Saupe, M.D. on 01/19/2012 07:20:04.  **END ADDENDUM** SIGNED BY: Cain Saupe, M.D.   01/04/2012  *RADIOLOGY REPORT*  Clinical Data:  Left lower outer quadrant breast cancer  LEFT BREAST NEEDLE LOCALIZATION USING ULTRASOUND GUIDANCE AND SPECIMEN RADIOGRAPH  Patient presents for needle localization prior to surgical excision. The patient denied a discussed the procedure of needle localization including benefits and alternatives. We discussed the high likelihood of a successful procedure. We discussed the risks of the procedure, including infection, bleeding, tissue injury, and further surgery. Informed written consent was given.  Using ultrasound guidance, sterile technique, 2% lidocaine, and a 7 cm InRad Ultrawire needle, the mass at 4 o'clock 12 cm from the left nipple was localized using a lateromedial approach. Films were labeled and sent with the patient surgery.  She tolerated the procedure well.  Specimen radiograph is performed at [facility] and confirms the mass, clip and wire to be present in the tissue sample.  The specimen is marked for  pathology.  IMPRESSION: Needle localization left breast.  No apparent complications.   Original Report Authenticated By: Daryl Eastern, M.D.       IMPRESSION: Stage I invasive ductal carcinoma of the left breast. She will be an excellent candidate for breast conservation therapy. Assuming the patient does proceed with hormonal therapy as part of her overall management, she may not require radiation therapy to reduce the chance of local recurrence. I however am concerned about the close surgical margin as well  as the presence of lymphovascular space invasion and perineural invasion. Patient will be meeting with Dr. Dalene Carrow in the near future for discussion of hormonal therapy. The patient would like to hold off on deciding on radiation therapy until she has met with Dr. Dalene Carrow to address those issues. I spent 60 minutes minutes face to face with the patient and more than 50% of that time was spent in counseling and/or coordination of care.   ------------------------------------------------    Billie Lade, PhD, MD

## 2012-02-10 ENCOUNTER — Encounter: Payer: Self-pay | Admitting: Hematology and Oncology

## 2012-02-10 ENCOUNTER — Ambulatory Visit (HOSPITAL_BASED_OUTPATIENT_CLINIC_OR_DEPARTMENT_OTHER): Payer: Medicare Other | Admitting: Hematology and Oncology

## 2012-02-10 ENCOUNTER — Encounter: Payer: Medicare Other | Admitting: Genetic Counselor

## 2012-02-10 ENCOUNTER — Other Ambulatory Visit: Payer: Medicare Other | Admitting: Lab

## 2012-02-10 ENCOUNTER — Telehealth: Payer: Self-pay | Admitting: Hematology and Oncology

## 2012-02-10 VITALS — BP 166/62 | HR 65 | Temp 98.0°F | Resp 20 | Ht 65.0 in | Wt 160.2 lb

## 2012-02-10 DIAGNOSIS — C50519 Malignant neoplasm of lower-outer quadrant of unspecified female breast: Secondary | ICD-10-CM

## 2012-02-10 DIAGNOSIS — Z170 Estrogen receptor positive status [ER+]: Secondary | ICD-10-CM

## 2012-02-10 DIAGNOSIS — C50919 Malignant neoplasm of unspecified site of unspecified female breast: Secondary | ICD-10-CM

## 2012-02-10 MED ORDER — ANASTROZOLE 1 MG PO TABS: 1.0000 mg | ORAL_TABLET | Freq: Every day | ORAL | Status: DC

## 2012-02-10 NOTE — Progress Notes (Signed)
This office note has been dictated.

## 2012-02-10 NOTE — Progress Notes (Signed)
CC:   [name], M.D. [name], M.D. Freddrick March. Tenny Craw, MD Dyke Maes, M.D. Billie Lade, Ph.D., M.D.  IDENTIFYING STATEMENT:  The patient is a 78 year old woman with breast cancer who presents for followup.  INTERVAL HISTORY:  Since the patient's last followup visit, she has undergone a partial mastectomy with sentinel node dissection on 01/04/2012.  Pathology noted a 1.3 cm.  Invasive ductal carcinoma with LV and perineural invasion.  The tumor was 2 mm from the nearest margin inferiorly.  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 None of the 3 lymph nodes sampled had evidence of tumor.  The tumor demonstrated 100% ER positivity and also 100% PR positivity.  HER-2/neu was negative by FISH. Ki67 proliferation marker was 14%.  The patient has recovered well from surgery.  She has consulted with Dr. Antony Blackbird with Radiation Oncology.    MEDICATIONS:  Reviewed and updated.  ALLERGIES:  Dilaudid, nonsteroidals and prednisone.  PAST MEDICAL HISTORY, FAMILY HISTORY, SOCIAL HISTORY:  Unchanged.  REVIEW OF SYSTEMS:  Ten point review of systems negative.  PHYSICAL EXAMINATION:  The patient is a well-appearing, well-nourished woman in no distress.  Vitals:  Blood pressure 165/82, temperature 98, respirations 20, weight 160.2 pounds.  HEENT:  Head is atraumatic, normocephalic.  Sclerae anicteric.  Mouth moist.  Neck:  Supple.  Chest: Clear.  Breasts:  Exam notes a well-healed lumpectomy incision in the left breast.  There is no palpable masses or nipple discharge.  Right breast likewise reveals no dominant masses or nipple discharge. Abdomen:  Soft, nontender.  Bowel sounds present.  Extremities:  No calf tenderness.  Lymph nodes:  No adenopathy.  CNS:  Nonfocal.  IMPRESSION AND PLAN:  Mrs. Christine is a pleasant 78 year old woman who is status post partial lumpectomy of the left breast with left sentinel axillary lymph node dissection on  01/04/2012 for a stage I, T1c N0 M0 invasive ductal carcinoma.  The tumor was ER 100%, PR 100% positive, HER- 2/neu negative.  There was evidence of lymphovascular and perineural invasion.  Surgical margins are somewhat close.  I would concur with Dr. Roselind Messier as I feel she will be an excellent candidate for radiation therapy.  Following radiation therapy, the patient will be treated with  hormonal therapy, for at least 5 years. She was told that the reasons for therapy was to decrease local recurrence in the affected breast and also the opposite breast.  We discussed the adverse reactions which include cardiovascular, dermatological, hot flashes, nausea and vomiting, osteoporosis, vaginal discharge, diarrhea, abnormal liver function testing,  thrombophlebitis and is in very small cases cataracts.  She was given literature to review.  I have asked that she obtain a baseline bone density testing which we will do with her OB/Gyn, Dr. Tenny Craw.  She will be given a prescription for Arimidex 1 mg daily to begin  once she has completed radiation.  She was given literature to review.  She  follows up once she has completed radiation.  She be referred to genetics.    ______________________________ Laurice Record, M.D. LIO/MEDQ  D:  02/10/2012  T:  02/10/2012  Job:  [id number]

## 2012-02-10 NOTE — Telephone Encounter (Signed)
Melissa Morton appt schedule for October for appt w/Dr. Roselind Messier and genetics. Morton could not take appt for 10/14 w/Dr. Roselind Messier due to Morton can only come Tuesday or Wednesday. Dr. Roselind Messier is not available on Tuesdays and the next available appt that Morton can do is 10/30. LO aware.

## 2012-02-10 NOTE — Addendum Note (Signed)
Addended by: Barbara Cower on: 02/10/2012 02:48 PM   Modules accepted: Orders

## 2012-02-10 NOTE — Patient Instructions (Signed)
Melissa Morton  [id number]   [facility] - AFTER VISIT SUMMARY   **RECOMMENDATIONS MADE BY THE CONSULTANT AND ANY TEST    RESULTS WILL BE SENT TO YOUR REFERRING DOCTORS.   YOUR EXAM FINDINGS, LABS AND RESULTS WERE DISCUSSED BY YOUR MD TODAY.  YOU CAN GO TO THE [facility] WEB SITE FOR INSTRUCTIONS ON HOW TO ASSESS MY CHART FOR ADDITIONAL INFORMATION AS NEEDED.  Your Updated drug allergies are: Allergies as of 02/10/2012 - Review Complete 01/31/2012  Allergen Reaction Noted  . Hydromorphone hcl Nausea And Vomiting 11/09/2011  . Nsaids  11/09/2011  . Prednisone Nausea And Vomiting 09/28/2011    Your current list of medications are: Current Outpatient Prescriptions  Medication Sig Dispense Refill  . albuterol (PROVENTIL) (2.5 MG/3ML) 0.083% nebulizer solution Take 2.5 mg by nebulization as needed.      Marland Kitchen alendronate (FOSAMAX) 70 MG tablet Take 70 mg by mouth every 7 (seven) days. On wednesday.   Take with a full glass of water on an empty stomach.      Marland Kitchen allopurinol (ZYLOPRIM) 300 MG tablet Take 300 mg by mouth daily.      Marland Kitchen aspirin EC 81 MG tablet Take 81 mg by mouth daily.      . calcium-vitamin D (OSCAL WITH D) 500-200 MG-UNIT per tablet Take 1 tablet by mouth 2 (two) times daily.      . colchicine 0.6 MG tablet Take 0.6 mg by mouth as needed.      . furosemide (LASIX) 40 MG tablet Take 40 mg by mouth daily.      Marland Kitchen losartan (COZAAR) 100 MG tablet Take 100 mg by mouth daily.      . pantoprazole (PROTONIX) 40 MG tablet Take 40 mg by mouth daily.      . polycarbophil (FIBERCON) 625 MG tablet Take 625 mg by mouth daily.      . potassium chloride SA (K-DUR,KLOR-CON) 20 MEQ tablet Take 20 mEq by mouth daily.      . simvastatin (ZOCOR) 40 MG tablet Take 40 mg by mouth every evening.      . solifenacin (VESICARE) 10 MG tablet Take 10 mg by mouth daily.      . traMADol (ULTRAM) 50 MG tablet Take 50 mg by mouth as needed.         INSTRUCTIONS GIVEN AND DISCUSSED:  See  attached schedule   SPECIAL INSTRUCTIONS/FOLLOW-UP:  See above.  I acknowledge that I have been informed and understand all the instructions given to me and received a copy.I know to contact the clinic, my physician, or go to the emergency Department if any problems should occur.   I do not have any more questions at this time, but understand that I may call the [facility] at [phone] during business hours should I have any further questions or need assistance in obtaining follow-up care.

## 2012-02-14 ENCOUNTER — Other Ambulatory Visit: Payer: Self-pay | Admitting: Obstetrics and Gynecology

## 2012-02-14 DIAGNOSIS — Z780 Asymptomatic menopausal state: Secondary | ICD-10-CM

## 2012-02-14 DIAGNOSIS — Z853 Personal history of malignant neoplasm of breast: Secondary | ICD-10-CM

## 2012-02-17 ENCOUNTER — Other Ambulatory Visit: Payer: Medicare Other | Admitting: Lab

## 2012-02-17 ENCOUNTER — Ambulatory Visit (HOSPITAL_BASED_OUTPATIENT_CLINIC_OR_DEPARTMENT_OTHER): Payer: Medicare Other | Admitting: Genetic Counselor

## 2012-02-17 ENCOUNTER — Encounter: Payer: Self-pay | Admitting: Genetic Counselor

## 2012-02-17 DIAGNOSIS — C50519 Malignant neoplasm of lower-outer quadrant of unspecified female breast: Secondary | ICD-10-CM

## 2012-02-17 DIAGNOSIS — C50912 Malignant neoplasm of unspecified site of left female breast: Secondary | ICD-10-CM

## 2012-02-17 DIAGNOSIS — IMO0002 Reserved for concepts with insufficient information to code with codable children: Secondary | ICD-10-CM

## 2012-02-17 DIAGNOSIS — Z803 Family history of malignant neoplasm of breast: Secondary | ICD-10-CM

## 2012-02-17 NOTE — Progress Notes (Addendum)
Dr.  Vicente Serene Odogwu requested a consultation for genetic counseling and risk assessment for Melissa Morton, a 78 y.o. female, for discussion of her personal and family history of breast cancer. She presents to clinic today with her son in law, to discuss the possibility of a genetic predisposition to cancer, and to further clarify her risks, as well as her family members' risks for cancer.   HISTORY OF PRESENT ILLNESS: In 2013, at the age of 55, Melissa Morton was diagnosed with invasive ductal carcinoma of the breast.   Past Medical History  Diagnosis Date  . Reflux   . Diverticulitis   . Hypertension   . Hyperlipemia   . Shortness of breath   . GERD (gastroesophageal reflux disease)   . PONV (postoperative nausea and vomiting)     has used scop patch-that helped  . Wears glasses   . Full dentures   . Breast cancer, left breast 12/07/2011  . Breast cancer, left breast 11/24/11    left breast 4 o'clock bx=invasive ductal ca,grade II,ER/PR=+  . Renal insufficiency 11/09/2011  . Arthritis     knee and hands  . Allergy     seasonal allergies  . HOH (hard of hearing)     right ear     Past Surgical History  Procedure Date  . Knee surgery 2000    rt   . Cholecystectomy   . Tubal ligation   . Bladder suspension   . Tonsillectomy   . Fracture surgery     lt foot  . Eye surgery     both cataracts  . Breast lumpectomy 01/04/2012    left,invasive ductcl ca, grade II,lloq, lymphovascular and perineural invaion ,DCIS,  . Abdominal hysterectomy 93    total    History  Substance Use Topics  . Smoking status: Former Smoker -- 0.2 packs/day for 20 years    Types: Cigarettes    Quit date: 11/09/1991  . Smokeless tobacco: Never Used  . Alcohol Use: Yes     socially    REPRODUCTIVE HISTORY AND PERSONAL RISK ASSESSMENT FACTORS: Menarche was at age 26.   Menopause at 58 Uterus Intact: No Ovaries Intact: No G6P6A0 , first live birth at age 82  She has not previously undergone  treatment for infertility.   OCP use for 7 years   She has not used HRT in the past.    FAMILY HISTORY:  We obtained a detailed, 4-generation family history.  Significant diagnoses are listed below: Family History  Problem Relation Age of Onset  . Breast cancer Sister 44  . Leukemia Sister   . Heart disease Father   . Breast cancer Maternal Aunt 36  . Lung cancer Cousin     smoker  The patient was diagnosed with breast cancer at age 35.  She has one sister who was diagnosed with breast cancer at age 66 and soon after treatment ended was diagnosed with leukemia.  Her mother's maternal half sister was diangosed with breast cancer at age 53 and died at 67.  A maternal cousin who was a smoker died of lung cancer.  There is no other reported family history of cancer.  Patient's maternal ancestors are of [place] descent, and paternal ancestors are of unknown descent. There is no reported Ashkenazi Jewish ancestry. There is no known consanguinity.  GENETIC COUNSELING RISK ASSESSMENT, DISCUSSION, AND SUGGESTED FOLLOW UP: We reviewed the natural history and genetic etiology of sporadic, familial and hereditary cancer syndromes.   About  5-10% of breast cancer is hereditary.  Of this, about 85% is the result of a BRCA1 or BRCA2 mutation.  We reviewed the red flags of hereditary cancer syndromes and the dominant inheritance patterns.  If the BRCA testing is negative, we discussed that we could be testing for the wrong gene.  We discussed gene panels, and that several cancer genes that are associated with different cancers can be tested at the same time.  The patient would like to pursue BreastNext panel testing.  The patient's personal and family history of breast cancer is suggestive of the following possible diagnosis: hereditary cancer syndrome  We discussed that identification of a hereditary cancer syndrome may help her care providers tailor the patients medical management. If a mutation indicating a  hereditary cancer syndrome is detected in this case, the [employer] recommendations would include increased cancer surveillance and possible prophylactic surgery. If a mutation is detected, the patient will be referred back to the referring provider and to any additional appropriate care providers to discuss the relevant options.   If a mutation is not found in the patient, this will decrease the likelihood of a hereditary cancer syndrome as the explanation for her breast cancer. Cancer surveillance options would be discussed for the patient according to the appropriate standard National Comprehensive Cancer Network and American Cancer Society guidelines, with consideration of their personal and family history risk factors. In this case, the patient will be referred back to their care providers for discussions of management.    After considering the risks, benefits, and limitations, the patient provided informed consent for  the following  testing: BreastNext through [employer]   Per the patient's request, we will contact her by telephone to discuss these results. A follow up genetic counseling visit will be scheduled if indicated.  The patient was seen for a total of 60 minutes, greater than 50% of which was spent face-to-face counseling.  This plan is being carried out per Dr. Lonell Face recommendations.  This note will also be sent to the referring provider via the electronic medical record. The patient will be supplied with a summary of this genetic counseling discussion as well as educational information on the discussed hereditary cancer syndromes following the conclusion of their visit.   The patient was reviewed with Dr. Drue Second.  _______________________________________________________________________ For Office Staff:  Number of people involved in session: 3 Was an Intern/ student involved with case: no

## 2012-02-29 ENCOUNTER — Ambulatory Visit (INDEPENDENT_AMBULATORY_CARE_PROVIDER_SITE_OTHER): Payer: Medicare Other | Admitting: Surgery

## 2012-02-29 ENCOUNTER — Encounter (INDEPENDENT_AMBULATORY_CARE_PROVIDER_SITE_OTHER): Payer: Self-pay | Admitting: Surgery

## 2012-02-29 VITALS — BP 150/92 | HR 84 | Temp 98.0°F | Resp 18 | Ht 64.0 in | Wt 163.0 lb

## 2012-02-29 DIAGNOSIS — Z9889 Other specified postprocedural states: Secondary | ICD-10-CM

## 2012-02-29 NOTE — Patient Instructions (Signed)
See me in about three months      Melissa Morton  is OK to attend the [employer] [name], MD, [facility], [place] [phone] 02/29/2012 2:46 PM

## 2012-02-29 NOTE — Progress Notes (Signed)
Avigail Artelia Laroche    [id number] 02/29/2012    December 26, 1933   CC: Post op left lumpectomy  HPI: The patient returns for post op follow-up. She underwent a NL left lumpectomy on on 01/04/12 . Over all she feels that she is doing well. She still has some tightness from the sln   PE: VITAL SIGNS: BP 150/92  Pulse 84  Temp 98 F (36.7 C) (Temporal)  Resp 18  Ht 5\' 4"  (1.626 m)  Wt 163 lb (73.936 kg)  BMI 27.98 kg/m2  The incision is healing nicely and there is no evidence of infection or hematoma. Slt limited ROM left shoulder    DATA REVIEWED: Pathology report showed IDC, 1.3 cm, Neg margin, neg SLN, LVI+  IMPRESSION: Patient doing well. To start radiation soon  PLAN: Her next visit will be in three months.

## 2012-03-01 ENCOUNTER — Ambulatory Visit
Admission: RE | Admit: 2012-03-01 | Discharge: 2012-03-01 | Disposition: A | Discharge status: Home / Self Care | Payer: Medicare Other | Source: Ambulatory Visit | Attending: Radiation Oncology | Admitting: Radiation Oncology

## 2012-03-01 ENCOUNTER — Encounter: Payer: Self-pay | Admitting: Radiation Oncology

## 2012-03-01 VITALS — BP 184/77 | HR 64 | Temp 97.6°F | Wt 163.8 lb

## 2012-03-01 DIAGNOSIS — C50912 Malignant neoplasm of unspecified site of left female breast: Secondary | ICD-10-CM

## 2012-03-01 NOTE — Progress Notes (Signed)
Please see the Nurse Progress Note in the MD Initial Consult Encounter for this patient. 

## 2012-03-01 NOTE — Progress Notes (Signed)
Patient here with daughter for consideration of radiation status post left lumpectomy on 01/04/2012.Given prescription for arimidex to start after completion of radiation.Denies pain.Does seem to be a little anxious but ready to get started with treatment.

## 2012-03-01 NOTE — Progress Notes (Signed)
Radiation Oncology         [phone] ________________________________  Name: LIA VIGILANTE MRN: [id number]  Date: 03/01/2012  DOB: [birth year]  Reevaluation note  CC: Melissa Blamer, MD  Melissa Record, MD  Diagnosis:   Stage I left breast cancer   Narrative:  The patient returns today for further evaluation.  Since my evaluation September 30 the patient was seen by Dr. Dalene Carrow. Patient was prescribed Arimidex as part of her overall management.  In addition given the close surgical margin as well as the presence of lymphovascular space invasion and perineural invasion, Dr. Dalene Carrow strongly encouraged patient to consider radiation therapy as part of her overall management.   The patient agrees to this the additional recommendation and is now seen for scheduling of her radiation therapy.                          ALLERGIES:  is allergic to hydromorphone hcl; nsaids; and prednisone.  Meds: Current Outpatient Prescriptions  Medication Sig Dispense Refill  . albuterol (PROVENTIL) (2.5 MG/3ML) 0.083% nebulizer solution Take 2.5 mg by nebulization as needed.      Marland Kitchen alendronate (FOSAMAX) 70 MG tablet Take 70 mg by mouth every 7 (seven) days. On wednesday.   Take with a full glass of water on an empty stomach.      Marland Kitchen allopurinol (ZYLOPRIM) 300 MG tablet Take 300 mg by mouth daily.      Marland Kitchen aspirin EC 81 MG tablet Take 81 mg by mouth daily.      . calcium-vitamin D (OSCAL WITH D) 500-200 MG-UNIT per tablet Take 1 tablet by mouth 2 (two) times daily.      . colchicine 0.6 MG tablet Take 0.6 mg by mouth as needed.      . furosemide (LASIX) 40 MG tablet Take 40 mg by mouth daily.      Marland Kitchen losartan (COZAAR) 100 MG tablet Take 100 mg by mouth daily.      . pantoprazole (PROTONIX) 40 MG tablet Take 40 mg by mouth daily.      . polycarbophil (FIBERCON) 625 MG tablet Take 625 mg by mouth daily.      . potassium chloride SA (K-DUR,KLOR-CON) 20 MEQ tablet Take 20 mEq by mouth daily.      . simvastatin  (ZOCOR) 40 MG tablet Take 40 mg by mouth every evening.      . solifenacin (VESICARE) 10 MG tablet Take 10 mg by mouth daily.      . traMADol (ULTRAM) 50 MG tablet Take 50 mg by mouth as needed.      Marland Kitchen anastrozole (ARIMIDEX) 1 MG tablet Take 1 tablet (1 mg total) by mouth daily.  90 tablet  3    Physical Findings: The patient is in no acute distress. Patient is alert and oriented.  weight is 163 lb 12.8 oz (74.299 kg). Her temperature is 97.6 F (36.4 C). Her blood pressure is 184/77 and her pulse is 64. .  The lungs are clear. The heart has a regular rhythm and rate. The right breast is free of mass or nipple discharge. Examination of left breast reveals a well healing scar in the lower outer quadrant adjacent to the inframammary fold area. There is no signs of drainage or infection. The patient has a separate scar in the axillary region from her sentinel node procedure which is also healing well.  Lab Findings: Lab Results  Component Value Date  WBC 7.8 12/30/2011   HGB 12.0 12/30/2011   HCT 37.4 12/30/2011   MCV 87.6 12/30/2011   PLT 273 12/30/2011    @LASTCHEM @  Radiographic Findings: No results found.  Impression:  Stage I invasive ductal carcinoma left breast. Given the close surgical margin as well as the presence of lymphovascular space invasion and perineural invasion,  the patient will receive radiation therapy as part of her overall management.  Plan:  CT simulation within the next few days.  _____________________________________  Billie Lade, PhD, MD

## 2012-03-03 NOTE — Addendum Note (Signed)
Encounter addended by: Lowella Petties, RN on: 03/03/2012  8:18 AM<BR>     Documentation filed: Charges VN

## 2012-03-03 NOTE — Addendum Note (Signed)
Encounter addended by: Lowella Petties, RN on: 03/03/2012  8:19 AM<BR>     Documentation filed: Charges VN

## 2012-03-06 ENCOUNTER — Ambulatory Visit
Admission: RE | Admit: 2012-03-06 | Discharge: 2012-03-06 | Disposition: A | Discharge status: Home / Self Care | Payer: Medicare Other | Source: Ambulatory Visit | Attending: Radiation Oncology | Admitting: Radiation Oncology

## 2012-03-06 DIAGNOSIS — C50912 Malignant neoplasm of unspecified site of left female breast: Secondary | ICD-10-CM

## 2012-03-06 NOTE — Addendum Note (Signed)
Encounter addended by: Adisa Vigeant Mintz Giann Obara, RN on: 03/06/2012  6:37 PM<BR>     Documentation filed: Charges VN

## 2012-03-06 NOTE — Progress Notes (Signed)
  Radiation Oncology         [phone] ________________________________  Name: Melissa Morton MRN: [id number]  Date: 03/06/2012  DOB: [birth year]  SIMULATION AND TREATMENT PLANNING NOTE  DIAGNOSIS:  Stage I left breast cancer  NARRATIVE:  The patient was brought to the CT Simulation planning suite.  Identity was confirmed.  All relevant records and images related to the planned course of therapy were reviewed.  The patient freely provided informed written consent to proceed with treatment after reviewing the details related to the planned course of therapy. The consent form was witnessed and verified by the simulation staff.  Then, the patient was set-up in a stable reproducible  supine position for radiation therapy.  CT images were obtained.  Surface markings were placed.  The CT images were loaded into the planning software.  Then the target and avoidance structures were contoured.  Treatment planning then occurred.  The radiation prescription was entered and confirmed.  A total of 3 complex treatment devices were fabricated. I have requested : Isodose Plan.  I have ordered:dose calc.  Deep inspiration breath hold techniques were used in light of the significant heart volume within an anticipated radiation field with free breathing.  PLAN:  The patient will receive 6440 cGy in 35 fractions.  ________________________________   Billie Lade, PhD, MD

## 2012-03-13 ENCOUNTER — Ambulatory Visit
Admission: RE | Admit: 2012-03-13 | Discharge: 2012-03-13 | Disposition: A | Discharge status: Home / Self Care | Payer: Medicare Other | Source: Ambulatory Visit | Attending: Radiation Oncology | Admitting: Radiation Oncology

## 2012-03-13 DIAGNOSIS — C50912 Malignant neoplasm of unspecified site of left female breast: Secondary | ICD-10-CM

## 2012-03-13 NOTE — Progress Notes (Signed)
  Radiation Oncology         [phone] ________________________________  Name: Melissa Morton MRN: [id number]  Date: 03/13/2012  DOB: 1933/10/13  Simulation Verification Note  Status: outpatient  NARRATIVE: The patient was brought to the treatment unit and placed in the planned treatment position. The clinical setup was verified. Then port films were obtained and uploaded to the radiation oncology medical record software.  The treatment beams were carefully compared against the planned radiation fields. The position location and shape of the radiation fields was reviewed. They targeted volume of tissue appears to be appropriately covered by the radiation beams. Organs at risk appear to be excluded as planned.  Based on my personal review, I approved the simulation verification. The patient's treatment will proceed as planned.  -----------------------------------  Billie Lade, PhD, MD

## 2012-03-14 ENCOUNTER — Ambulatory Visit
Admission: RE | Admit: 2012-03-14 | Discharge: 2012-03-14 | Disposition: A | Discharge status: Home / Self Care | Payer: Medicare Other | Source: Ambulatory Visit | Attending: Radiation Oncology | Admitting: Radiation Oncology

## 2012-03-14 VITALS — BP 178/79 | HR 66 | Temp 97.4°F | Wt 162.9 lb

## 2012-03-14 DIAGNOSIS — C50912 Malignant neoplasm of unspecified site of left female breast: Secondary | ICD-10-CM

## 2012-03-14 MED ORDER — ALRA NON-METALLIC DEODORANT (RAD-ONC)
1.0000 "application " | Freq: Once | TOPICAL | Status: AC
  Administered 2012-03-14: 1 via TOPICAL

## 2012-03-14 MED ORDER — RADIAPLEXRX EX GEL
Freq: Once | CUTANEOUS | Status: AC
  Administered 2012-03-14: 1 via TOPICAL

## 2012-03-14 NOTE — Progress Notes (Signed)
Patient and daughter Melissa Morton here for routine weekly under treat visit for left breast cancer treatment.Completed 1 of 28 today.Patient education performed.Given alra deodorant and radiaplex gel.

## 2012-03-14 NOTE — Progress Notes (Signed)
[facility]    Radiation Oncology [address] [place]     [name], M.D. [place], [place] [address]               Billie Lade, M.D., Ph.D. Phone: [phone]      Molli Hazard A. Kathrynn Running, M.D. Fax: [phone]      Radene Gunning, M.D., Ph.D.         Lurline Hare, M.D.         Grayland Jack, M.D Weekly Treatment Management Note  Name: Melissa Morton     MRN: [id number]        CSN: [id number] Date: 03/14/2012      DOB: [birth year]  CC: [name], MD         T[place]urcio Pea    Status: Outpatient  Diagnosis: The encounter diagnosis was Breast cancer, left breast.  Current Dose: 1.8 Gy  Current Fraction: 1  Planned Dose: 64.4 Gy  Narrative: Melissa Morton was seen today for weekly treatment management. The chart was checked and port films  were reviewed.  She is tolerating the radiation treatment well this time without any side effects.  Hydromorphone hcl; Nsaids; and Prednisone Current Outpatient Prescriptions  Medication Sig Dispense Refill  . albuterol (PROVENTIL) (2.5 MG/3ML) 0.083% nebulizer solution Take 2.5 mg by nebulization as needed.      Marland Kitchen alendronate (FOSAMAX) 70 MG tablet Take 70 mg by mouth every 7 (seven) days. On wednesday.   Take with a full glass of water on an empty stomach.      Marland Kitchen allopurinol (ZYLOPRIM) 300 MG tablet Take 300 mg by mouth daily.      Marland Kitchen anastrozole (ARIMIDEX) 1 MG tablet Take 1 tablet (1 mg total) by mouth daily.  90 tablet  3  . aspirin EC 81 MG tablet Take 81 mg by mouth daily.      . calcium-vitamin D (OSCAL WITH D) 500-200 MG-UNIT per tablet Take 1 tablet by mouth 2 (two) times daily.      . colchicine 0.6 MG tablet Take 0.6 mg by mouth as needed.      . furosemide (LASIX) 40 MG tablet Take 40 mg by mouth daily.      Marland Kitchen losartan (COZAAR) 100 MG tablet Take 100 mg by mouth daily.      . pantoprazole (PROTONIX) 40 MG tablet Take 40 mg by mouth daily.      . polycarbophil (FIBERCON) 625 MG tablet Take 625 mg by mouth daily.       . potassium chloride SA (K-DUR,KLOR-CON) 20 MEQ tablet Take 20 mEq by mouth daily.      . simvastatin (ZOCOR) 40 MG tablet Take 40 mg by mouth every evening.      . solifenacin (VESICARE) 10 MG tablet Take 10 mg by mouth daily.      . traMADol (ULTRAM) 50 MG tablet Take 50 mg by mouth as needed.       Current Facility-Administered Medications  Medication Dose Route Frequency Provider Last Rate Last Dose  . [COMPLETED] hyaluronate sodium (RADIAPLEXRX) gel   Topical Once Billie Lade, MD   1 application at 03/14/12 0934  . [COMPLETED] non-metallic deodorant (ALRA) 1 application  1 application Topical Once Billie Lade, MD   1 application at 03/14/12 0934   Labs:  Lab Results  Component Value Date   WBC 7.8 12/30/2011   HGB 12.0 12/30/2011   HCT 37.4 12/30/2011   MCV 87.6 12/30/2011  PLT 273 12/30/2011   Lab Results  Component Value Date   CREATININE 1.5* 12/30/2011   BUN 24.0 12/30/2011   NA 141 12/30/2011   K 5.1 12/30/2011   CL 105 12/30/2011   CO2 28 12/30/2011   Lab Results  Component Value Date   ALT 10 12/30/2011   AST 16 12/30/2011   BILITOT 0.50 12/30/2011    Physical Examination:  weight is 162 lb 14.4 oz (73.891 kg). Her temperature is 97.4 F (36.3 C). Her blood pressure is 178/79 and her pulse is 66.    Wt Readings from Last 3 Encounters:  03/14/12 162 lb 14.4 oz (73.891 kg)  03/01/12 163 lb 12.8 oz (74.299 kg)  02/29/12 163 lb (73.936 kg)     Lungs - Normal respiratory effort, chest expands symmetrically. Lungs are clear to auscultation, no crackles or wheezes.  Heart has regular rhythm and rate  Abdomen is soft and non tender with normal bowel sounds Radiation marks are placed along the left breast area. There is no significant skin reaction at this time.  Assessment:  Patient tolerating treatments well  Plan: Continue treatment per original radiation prescription

## 2012-03-15 ENCOUNTER — Ambulatory Visit
Admission: RE | Admit: 2012-03-15 | Discharge: 2012-03-15 | Disposition: A | Discharge status: Home / Self Care | Payer: Medicare Other | Source: Ambulatory Visit | Attending: Radiation Oncology | Admitting: Radiation Oncology

## 2012-03-15 NOTE — Addendum Note (Signed)
Encounter addended by: Ayanna Gheen Mintz Jacaria Colburn, RN on: 03/15/2012  6:53 PM<BR>     Documentation filed: Charges VN

## 2012-03-16 ENCOUNTER — Ambulatory Visit
Admission: RE | Admit: 2012-03-16 | Discharge: 2012-03-16 | Disposition: A | Discharge status: Home / Self Care | Payer: Medicare Other | Source: Ambulatory Visit | Attending: Radiation Oncology | Admitting: Radiation Oncology

## 2012-03-17 ENCOUNTER — Ambulatory Visit
Admission: RE | Admit: 2012-03-17 | Discharge: 2012-03-17 | Disposition: A | Discharge status: Home / Self Care | Payer: Medicare Other | Source: Ambulatory Visit | Attending: Radiation Oncology | Admitting: Radiation Oncology

## 2012-03-20 ENCOUNTER — Ambulatory Visit
Admission: RE | Admit: 2012-03-20 | Discharge: 2012-03-20 | Disposition: A | Discharge status: Home / Self Care | Payer: Medicare Other | Source: Ambulatory Visit | Attending: Radiation Oncology | Admitting: Radiation Oncology

## 2012-03-21 ENCOUNTER — Ambulatory Visit
Admission: RE | Admit: 2012-03-21 | Discharge: 2012-03-21 | Disposition: A | Discharge status: Home / Self Care | Payer: Medicare Other | Source: Ambulatory Visit | Attending: Radiation Oncology | Admitting: Radiation Oncology

## 2012-03-21 DIAGNOSIS — C50912 Malignant neoplasm of unspecified site of left female breast: Secondary | ICD-10-CM

## 2012-03-21 NOTE — Progress Notes (Signed)
[facility]    Radiation Oncology [address] [place]     [name], M.D. [place], [place] [address]               Billie Lade, M.D., Ph.D. Phone: [phone]      Molli Hazard A. Kathrynn Running, M.D. Fax: [phone]      Radene Gunning, M.D., Ph.D.         Lurline Hare, M.D.         Grayland Jack, M.D Weekly Treatment Management Note  Name: Melissa Morton     MRN: [id number]        CSN: [id number] Date: 03/21/2012      DOB: 08/18/1933  CC: [name], MD         Tiburcio Pea    Status: Outpatient  Diagnosis: The encounter diagnosis was Breast cancer, left breast.  Current Dose: 10.8 Gy  Current Fraction: 6  Planned Dose: ~63.0 Gy  Narrative: Melissa Morton was seen today for weekly treatment management. The chart was checked and port films  were reviewed. She is tolerating the treatments well at this time. She has noticed occasional twinges within the left breast.  She is also noticed mild swelling in her left forearm.  She denies any cough or breathing problems or pain within the breast.  Hydromorphone hcl; Nsaids; and Prednisone Current Outpatient Prescriptions  Medication Sig Dispense Refill  . albuterol (PROVENTIL) (2.5 MG/3ML) 0.083% nebulizer solution Take 2.5 mg by nebulization as needed.      Marland Kitchen alendronate (FOSAMAX) 70 MG tablet Take 70 mg by mouth every 7 (seven) days. On wednesday.   Take with a full glass of water on an empty stomach.      Marland Kitchen allopurinol (ZYLOPRIM) 300 MG tablet Take 300 mg by mouth daily.      Marland Kitchen anastrozole (ARIMIDEX) 1 MG tablet Take 1 tablet (1 mg total) by mouth daily.  90 tablet  3  . aspirin EC 81 MG tablet Take 81 mg by mouth daily.      . calcium-vitamin D (OSCAL WITH D) 500-200 MG-UNIT per tablet Take 1 tablet by mouth 2 (two) times daily.      . colchicine 0.6 MG tablet Take 0.6 mg by mouth as needed.      . furosemide (LASIX) 40 MG tablet Take 40 mg by mouth daily.      Marland Kitchen losartan (COZAAR) 100 MG tablet Take 100 mg by  mouth daily.      . pantoprazole (PROTONIX) 40 MG tablet Take 40 mg by mouth daily.      . polycarbophil (FIBERCON) 625 MG tablet Take 625 mg by mouth daily.      . potassium chloride SA (K-DUR,KLOR-CON) 20 MEQ tablet Take 20 mEq by mouth daily.      . simvastatin (ZOCOR) 40 MG tablet Take 40 mg by mouth every evening.      . solifenacin (VESICARE) 10 MG tablet Take 10 mg by mouth daily.      . traMADol (ULTRAM) 50 MG tablet Take 50 mg by mouth as needed.       Labs:  Lab Results  Component Value Date   WBC 7.8 12/30/2011   HGB 12.0 12/30/2011   HCT 37.4 12/30/2011   MCV 87.6 12/30/2011   PLT 273 12/30/2011   Lab Results  Component Value Date   CREATININE 1.5* 12/30/2011   BUN 24.0 12/30/2011   NA 141 12/30/2011   K 5.1 12/30/2011  CL 105 12/30/2011   CO2 28 12/30/2011   Lab Results  Component Value Date   ALT 10 12/30/2011   AST 16 12/30/2011   BILITOT 0.50 12/30/2011    Physical Examination:  vitals were not taken for this visit.   Wt Readings from Last 3 Encounters:  03/14/12 162 lb 14.4 oz (73.891 kg)  03/01/12 163 lb 12.8 oz (74.299 kg)  02/29/12 163 lb (73.936 kg)     Lungs - Normal respiratory effort, chest expands symmetrically. Lungs are clear to auscultation, no crackles or wheezes.  Heart has regular rhythm and rate  Abdomen is soft and non tender with normal bowel sounds The left breast area shows mild erythema. There are no palpable "cords"  within the left upper arm.   she has some very mild swelling in the left forearm area. There's no signs of infection along the left arm.  Assessment:  Patient tolerating treatments well  Plan: Continue treatment per original radiation prescription.  The patient will be know if she continues to have more swelling in her left arm.  If so she may require a Doppler.

## 2012-03-21 NOTE — Progress Notes (Signed)
Patient here for weekly assessment of left breast cancer radiation treatment.Completed 6 of 28 treatments.Mild pink changes.Mild fatigue.Denies pain.

## 2012-03-22 ENCOUNTER — Ambulatory Visit
Admission: RE | Admit: 2012-03-22 | Discharge: 2012-03-22 | Disposition: A | Discharge status: Home / Self Care | Payer: Medicare Other | Source: Ambulatory Visit | Attending: Radiation Oncology | Admitting: Radiation Oncology

## 2012-03-23 ENCOUNTER — Ambulatory Visit
Admission: RE | Admit: 2012-03-23 | Discharge: 2012-03-23 | Disposition: A | Discharge status: Home / Self Care | Payer: Medicare Other | Source: Ambulatory Visit | Attending: Radiation Oncology | Admitting: Radiation Oncology

## 2012-03-24 ENCOUNTER — Ambulatory Visit
Admission: RE | Admit: 2012-03-24 | Discharge: 2012-03-24 | Disposition: A | Discharge status: Home / Self Care | Payer: Medicare Other | Source: Ambulatory Visit | Attending: Radiation Oncology | Admitting: Radiation Oncology

## 2012-03-25 ENCOUNTER — Ambulatory Visit
Admission: RE | Admit: 2012-03-25 | Discharge: 2012-03-25 | Disposition: A | Discharge status: Home / Self Care | Payer: Medicare Other | Source: Ambulatory Visit | Attending: Radiation Oncology | Admitting: Radiation Oncology

## 2012-03-27 ENCOUNTER — Ambulatory Visit
Admission: RE | Admit: 2012-03-27 | Discharge: 2012-03-27 | Disposition: A | Discharge status: Home / Self Care | Payer: Medicare Other | Source: Ambulatory Visit | Attending: Radiation Oncology | Admitting: Radiation Oncology

## 2012-03-28 ENCOUNTER — Encounter: Payer: Self-pay | Admitting: Radiation Oncology

## 2012-03-28 ENCOUNTER — Ambulatory Visit
Admission: RE | Admit: 2012-03-28 | Discharge: 2012-03-28 | Disposition: A | Discharge status: Home / Self Care | Payer: Medicare Other | Source: Ambulatory Visit | Attending: Radiation Oncology | Admitting: Radiation Oncology

## 2012-03-28 VITALS — BP 158/73 | HR 67 | Temp 97.8°F | Resp 20 | Wt 164.0 lb

## 2012-03-28 DIAGNOSIS — C50912 Malignant neoplasm of unspecified site of left female breast: Secondary | ICD-10-CM

## 2012-03-28 NOTE — Progress Notes (Signed)
Patient here weekly rad txs left breast:12/21 completed, alert,oriented x3. Slight erythema on skin, no skin breakdown, occasional twinges in left breast, uses radiaplex gel bid,taking rx for thrush again 8:29 AM

## 2012-03-28 NOTE — Progress Notes (Signed)
[facility]    Radiation Oncology [address] [place]     [name], M.D. [place], [place] [address]               Billie Lade, M.D., Ph.D. Phone: [phone]      Molli Hazard A. Kathrynn Running, M.D. Fax: [phone]      Radene Gunning, M.D., Ph.D.         Lurline Hare, M.D.         Grayland Jack, M.D Weekly Treatment Management Note  Name: AYZLEE RAIFORD     MRN: [id number]        CSN: [id number] Date: 03/28/2012      DOB: 1934-04-18  CC: [name], MD         Tiburcio Pea    Status: Outpatient  Diagnosis: The encounter diagnosis was Breast cancer, left breast.  Current Dose: 21.6 Gy  Current Fraction: 12  Planned Dose: 63.0 Gy  Narrative: DIXIE IRION was seen today for weekly treatment management. The chart was checked and port films  were reviewed. She continues to tolerate her treatments well at this time without any significant fatigue itching or discomfort in the breast area.  Hydromorphone hcl; Nsaids; and Prednisone  Current Outpatient Prescriptions  Medication Sig Dispense Refill  . albuterol (PROVENTIL) (2.5 MG/3ML) 0.083% nebulizer solution Take 2.5 mg by nebulization as needed.      Marland Kitchen alendronate (FOSAMAX) 70 MG tablet Take 70 mg by mouth every 7 (seven) days. On wednesday.   Take with a full glass of water on an empty stomach.      Marland Kitchen allopurinol (ZYLOPRIM) 300 MG tablet Take 300 mg by mouth daily.      Marland Kitchen anastrozole (ARIMIDEX) 1 MG tablet Take 1 tablet (1 mg total) by mouth daily.  90 tablet  3  . aspirin EC 81 MG tablet Take 81 mg by mouth daily.      . calcium-vitamin D (OSCAL WITH D) 500-200 MG-UNIT per tablet Take 1 tablet by mouth 2 (two) times daily.      . colchicine 0.6 MG tablet Take 0.6 mg by mouth as needed.      . furosemide (LASIX) 40 MG tablet Take 40 mg by mouth daily.      Marland Kitchen losartan (COZAAR) 100 MG tablet Take 100 mg by mouth daily.      . pantoprazole (PROTONIX) 40 MG tablet Take 40 mg by mouth daily.      . polycarbophil  (FIBERCON) 625 MG tablet Take 625 mg by mouth daily.      . potassium chloride SA (K-DUR,KLOR-CON) 20 MEQ tablet Take 20 mEq by mouth daily.      . simvastatin (ZOCOR) 40 MG tablet Take 40 mg by mouth every evening.      . solifenacin (VESICARE) 10 MG tablet Take 10 mg by mouth daily.      . traMADol (ULTRAM) 50 MG tablet Take 50 mg by mouth as needed.       Labs:  Lab Results  Component Value Date   WBC 7.8 12/30/2011   HGB 12.0 12/30/2011   HCT 37.4 12/30/2011   MCV 87.6 12/30/2011   PLT 273 12/30/2011   Lab Results  Component Value Date   CREATININE 1.5* 12/30/2011   BUN 24.0 12/30/2011   NA 141 12/30/2011   K 5.1 12/30/2011   CL 105 12/30/2011   CO2 28 12/30/2011   Lab Results  Component Value Date   ALT 10  12/30/2011   AST 16 12/30/2011   BILITOT 0.50 12/30/2011    Physical Examination:  weight is 164 lb (74.39 kg). Her oral temperature is 97.8 F (36.6 C). Her blood pressure is 158/73 and her pulse is 67. Her respiration is 20.    Wt Readings from Last 3 Encounters:  03/28/12 164 lb (74.39 kg)  03/14/12 162 lb 14.4 oz (73.891 kg)  03/01/12 163 lb 12.8 oz (74.299 kg)    The left breast area shows mild hyperpigmentation changes and erythema. Lungs - Normal respiratory effort, chest expands symmetrically. Lungs are clear to auscultation, no crackles or wheezes.  Heart has regular rhythm and rate  Abdomen is soft and non tender with normal bowel sounds  Assessment:  Patient tolerating treatments well  Plan: Continue treatment per original radiation prescription

## 2012-03-29 ENCOUNTER — Ambulatory Visit
Admission: RE | Admit: 2012-03-29 | Discharge: 2012-03-29 | Disposition: A | Discharge status: Home / Self Care | Payer: Medicare Other | Source: Ambulatory Visit | Attending: Radiation Oncology | Admitting: Radiation Oncology

## 2012-04-03 ENCOUNTER — Ambulatory Visit
Admission: RE | Admit: 2012-04-03 | Discharge: 2012-04-03 | Disposition: A | Discharge status: Home / Self Care | Payer: Medicare Other | Source: Ambulatory Visit | Attending: Radiation Oncology | Admitting: Radiation Oncology

## 2012-04-04 ENCOUNTER — Ambulatory Visit
Admission: RE | Admit: 2012-04-04 | Discharge: 2012-04-04 | Disposition: A | Discharge status: Home / Self Care | Payer: Medicare Other | Source: Ambulatory Visit | Attending: Radiation Oncology | Admitting: Radiation Oncology

## 2012-04-04 VITALS — BP 168/69 | HR 63 | Temp 97.4°F | Wt 165.2 lb

## 2012-04-04 DIAGNOSIS — C50912 Malignant neoplasm of unspecified site of left female breast: Secondary | ICD-10-CM

## 2012-04-04 NOTE — Progress Notes (Signed)
[facility]    Radiation Oncology [address] [place]     [name], M.D. [place], [place] [address]               Billie Lade, M.D., Ph.D. Phone: [phone]      Molli Hazard A. Kathrynn Running, M.D. Fax: [phone]      Radene Gunning, M.D., Ph.D.         Lurline Hare, M.D.         Grayland Jack, M.D Weekly Treatment Management Note  Name: Melissa Morton     MRN: [id number]        CSN: [id number] Date: 04/04/2012      DOB: 05/22/1933  CC: [name], MD         Tiburcio Pea    Status: Outpatient  Diagnosis: The encounter diagnosis was Breast cancer, left breast.  Current Dose: 27.0 Gy  Current Fraction: 8  Planned Dose: 63.0 Gy  Narrative: Melissa Morton was seen today for weekly treatment management. The chart was checked and port films  were reviewed. She is tolerating the treatments well at this time. She has some fatigue but continues her usual schedule.  she denies any itching or discomfort in the left breast area.  She denies any problems with swelling in her left arm or hand at this time.  Hydromorphone hcl; Nsaids; and Prednisone  Current Outpatient Prescriptions  Medication Sig Dispense Refill  . albuterol (PROVENTIL) (2.5 MG/3ML) 0.083% nebulizer solution Take 2.5 mg by nebulization as needed.      Marland Kitchen alendronate (FOSAMAX) 70 MG tablet Take 70 mg by mouth every 7 (seven) days. On wednesday.   Take with a full glass of water on an empty stomach.      Marland Kitchen allopurinol (ZYLOPRIM) 300 MG tablet Take 300 mg by mouth daily.      Marland Kitchen anastrozole (ARIMIDEX) 1 MG tablet Take 1 tablet (1 mg total) by mouth daily.  90 tablet  3  . aspirin EC 81 MG tablet Take 81 mg by mouth daily.      . calcium-vitamin D (OSCAL WITH D) 500-200 MG-UNIT per tablet Take 1 tablet by mouth 2 (two) times daily.      . colchicine 0.6 MG tablet Take 0.6 mg by mouth as needed.      . furosemide (LASIX) 40 MG tablet Take 40 mg by mouth daily.      Marland Kitchen losartan (COZAAR) 100 MG tablet Take 100  mg by mouth daily.      . pantoprazole (PROTONIX) 40 MG tablet Take 40 mg by mouth daily.      . polycarbophil (FIBERCON) 625 MG tablet Take 625 mg by mouth daily.      . potassium chloride SA (K-DUR,KLOR-CON) 20 MEQ tablet Take 20 mEq by mouth daily.      . simvastatin (ZOCOR) 40 MG tablet Take 40 mg by mouth every evening.      . solifenacin (VESICARE) 10 MG tablet Take 10 mg by mouth daily.      . traMADol (ULTRAM) 50 MG tablet Take 50 mg by mouth as needed.       Labs:  Lab Results  Component Value Date   WBC 7.8 12/30/2011   HGB 12.0 12/30/2011   HCT 37.4 12/30/2011   MCV 87.6 12/30/2011   PLT 273 12/30/2011   Lab Results  Component Value Date   CREATININE 1.5* 12/30/2011   BUN 24.0 12/30/2011   NA 141 12/30/2011  K 5.1 12/30/2011   CL 105 12/30/2011   CO2 28 12/30/2011   Lab Results  Component Value Date   ALT 10 12/30/2011   AST 16 12/30/2011   BILITOT 0.50 12/30/2011    Physical Examination:  weight is 165 lb 3.2 oz (74.934 kg). Her temperature is 97.4 F (36.3 C). Her blood pressure is 168/69 and her pulse is 63.    Wt Readings from Last 3 Encounters:  04/04/12 165 lb 3.2 oz (74.934 kg)  03/28/12 164 lb (74.39 kg)  03/14/12 162 lb 14.4 oz (73.891 kg)    The left breast area shows some mild erythema Lungs - Normal respiratory effort, chest expands symmetrically. Lungs are clear to auscultation, no crackles or wheezes.  Heart has regular rhythm and rate  Abdomen is soft and non tender with normal bowel sounds There is no significant swelling noted in the left arm or hand area at this time.  Patient tolerating treatments well  Plan: Continue treatment per original radiation prescription

## 2012-04-04 NOTE — Progress Notes (Signed)
Weekly assessment of left breast cancer.Skin mildly pink.Generalized fatigue.

## 2012-04-05 ENCOUNTER — Ambulatory Visit
Admission: RE | Admit: 2012-04-05 | Discharge: 2012-04-05 | Disposition: A | Discharge status: Home / Self Care | Payer: Medicare Other | Source: Ambulatory Visit | Attending: Radiation Oncology | Admitting: Radiation Oncology

## 2012-04-06 ENCOUNTER — Ambulatory Visit
Admission: RE | Admit: 2012-04-06 | Discharge: 2012-04-06 | Disposition: A | Discharge status: Home / Self Care | Payer: Medicare Other | Source: Ambulatory Visit | Attending: Radiation Oncology | Admitting: Radiation Oncology

## 2012-04-07 ENCOUNTER — Ambulatory Visit
Admission: RE | Admit: 2012-04-07 | Discharge: 2012-04-07 | Disposition: A | Discharge status: Home / Self Care | Payer: Medicare Other | Source: Ambulatory Visit | Attending: Radiation Oncology | Admitting: Radiation Oncology

## 2012-04-10 ENCOUNTER — Ambulatory Visit
Admission: RE | Admit: 2012-04-10 | Discharge: 2012-04-10 | Disposition: A | Discharge status: Home / Self Care | Payer: Medicare Other | Source: Ambulatory Visit | Attending: Radiation Oncology | Admitting: Radiation Oncology

## 2012-04-11 ENCOUNTER — Ambulatory Visit
Admission: RE | Admit: 2012-04-11 | Discharge: 2012-04-11 | Disposition: A | Discharge status: Home / Self Care | Payer: Medicare Other | Source: Ambulatory Visit | Attending: Radiation Oncology | Admitting: Radiation Oncology

## 2012-04-12 ENCOUNTER — Ambulatory Visit
Admission: RE | Admit: 2012-04-12 | Discharge: 2012-04-12 | Disposition: A | Discharge status: Home / Self Care | Payer: Medicare Other | Source: Ambulatory Visit | Attending: Radiation Oncology | Admitting: Radiation Oncology

## 2012-04-12 VITALS — BP 152/70 | HR 66 | Temp 97.3°F | Wt 160.3 lb

## 2012-04-12 DIAGNOSIS — C50912 Malignant neoplasm of unspecified site of left female breast: Secondary | ICD-10-CM

## 2012-04-12 MED ORDER — RADIAPLEXRX EX GEL
Freq: Once | CUTANEOUS | Status: AC
  Administered 2012-04-12: 1 via TOPICAL

## 2012-04-12 NOTE — Addendum Note (Signed)
Encounter addended by: Tessa Lerner, RN on: 04/12/2012 12:47 PM<BR>     Documentation filed: Visit Diagnoses, Inpatient MAR, Orders

## 2012-04-12 NOTE — Progress Notes (Signed)
[facility]    Radiation Oncology [address] [place]     [name], M.D. [place], [place] [address]               Billie Lade, M.D., Ph.D. Phone: [phone]      Molli Hazard A. Kathrynn Running, M.D. Fax: [phone]      Radene Gunning, M.D., Ph.D.         Lurline Hare, M.D.         Grayland Jack, M.D Weekly Treatment Management Note  Name: Melissa Morton     MRN: [id number]        CSN: [id number] Date: 04/12/2012      DOB: 11-30-1933  CC: [name], MD         Tiburcio Pea    Status: Outpatient  Diagnosis: The encounter diagnosis was Breast cancer, left breast.  Current Dose: 37.8 Gy  Current Fraction: 22  Planned Dose: 63.0 Gy  Narrative: Melissa Morton was seen today for weekly treatment management. The chart was checked and port films  were reviewed. She is starting to have some fatigue at this time. She denies any significant itching or discomfort in the breast area.    Hydromorphone hcl; Nsaids; and Prednisone  Current Outpatient Prescriptions  Medication Sig Dispense Refill  . albuterol (PROVENTIL) (2.5 MG/3ML) 0.083% nebulizer solution Take 2.5 mg by nebulization as needed.      Marland Kitchen alendronate (FOSAMAX) 70 MG tablet Take 70 mg by mouth every 7 (seven) days. On wednesday.   Take with a full glass of water on an empty stomach.      Marland Kitchen allopurinol (ZYLOPRIM) 300 MG tablet Take 300 mg by mouth daily.      Marland Kitchen anastrozole (ARIMIDEX) 1 MG tablet Take 1 tablet (1 mg total) by mouth daily.  90 tablet  3  . aspirin EC 81 MG tablet Take 81 mg by mouth daily.      . calcium-vitamin D (OSCAL WITH D) 500-200 MG-UNIT per tablet Take 1 tablet by mouth 2 (two) times daily.      . colchicine 0.6 MG tablet Take 0.6 mg by mouth as needed.      . furosemide (LASIX) 40 MG tablet Take 40 mg by mouth daily.      Marland Kitchen losartan (COZAAR) 100 MG tablet Take 100 mg by mouth daily.      . pantoprazole (PROTONIX) 40 MG tablet Take 40 mg by mouth daily.      . polycarbophil (FIBERCON)  625 MG tablet Take 625 mg by mouth daily.      . potassium chloride SA (K-DUR,KLOR-CON) 20 MEQ tablet Take 20 mEq by mouth daily.      . simvastatin (ZOCOR) 40 MG tablet Take 40 mg by mouth every evening.      . solifenacin (VESICARE) 10 MG tablet Take 10 mg by mouth daily.      . traMADol (ULTRAM) 50 MG tablet Take 50 mg by mouth as needed.       Labs:  Lab Results  Component Value Date   WBC 7.8 12/30/2011   HGB 12.0 12/30/2011   HCT 37.4 12/30/2011   MCV 87.6 12/30/2011   PLT 273 12/30/2011   Lab Results  Component Value Date   CREATININE 1.5* 12/30/2011   BUN 24.0 12/30/2011   NA 141 12/30/2011   K 5.1 12/30/2011   CL 105 12/30/2011   CO2 28 12/30/2011   Lab Results  Component Value Date  ALT 10 12/30/2011   AST 16 12/30/2011   BILITOT 0.50 12/30/2011    Physical Examination:  weight is 160 lb 4.8 oz (72.712 kg). Her temperature is 97.3 F (36.3 C). Her blood pressure is 152/70 and her pulse is 66.    Wt Readings from Last 3 Encounters:  04/12/12 160 lb 4.8 oz (72.712 kg)  04/04/12 165 lb 3.2 oz (74.934 kg)  03/28/12 164 lb (74.39 kg)    The left breast area shows some hyperpigmentation changes and erythema particularly in the inframammary fold area. There is no moist desquamation noted. Lungs - Normal respiratory effort, chest expands symmetrically. Lungs are clear to auscultation, no crackles or wheezes.  Heart has regular rhythm and rate  Abdomen is soft and non tender with normal bowel sounds  Assessment:  Patient tolerating treatments well  Plan: Continue treatment per original radiation prescription

## 2012-04-12 NOTE — Progress Notes (Signed)
Patient here for routine under treat visit for left breast cancer.Mild redness with beginning of rash abut no itching.Getting over intestinal virus from last week.Doing much better.Generalized fatigue.

## 2012-04-13 ENCOUNTER — Ambulatory Visit
Admission: RE | Admit: 2012-04-13 | Discharge: 2012-04-13 | Disposition: A | Discharge status: Home / Self Care | Payer: Medicare Other | Source: Ambulatory Visit | Attending: Radiation Oncology | Admitting: Radiation Oncology

## 2012-04-14 ENCOUNTER — Ambulatory Visit
Admission: RE | Admit: 2012-04-14 | Discharge: 2012-04-14 | Disposition: A | Discharge status: Home / Self Care | Payer: Medicare Other | Source: Ambulatory Visit | Attending: Radiation Oncology | Admitting: Radiation Oncology

## 2012-04-17 ENCOUNTER — Ambulatory Visit
Admission: RE | Admit: 2012-04-17 | Discharge: 2012-04-17 | Disposition: A | Discharge status: Home / Self Care | Payer: Medicare Other | Source: Ambulatory Visit | Attending: Radiation Oncology | Admitting: Radiation Oncology

## 2012-04-18 ENCOUNTER — Ambulatory Visit
Admission: RE | Admit: 2012-04-18 | Discharge: 2012-04-18 | Disposition: A | Discharge status: Home / Self Care | Payer: Medicare Other | Source: Ambulatory Visit | Attending: Radiation Oncology | Admitting: Radiation Oncology

## 2012-04-19 ENCOUNTER — Ambulatory Visit
Admission: RE | Admit: 2012-04-19 | Discharge: 2012-04-19 | Disposition: A | Discharge status: Home / Self Care | Payer: Medicare Other | Source: Ambulatory Visit | Attending: Radiation Oncology | Admitting: Radiation Oncology

## 2012-04-20 ENCOUNTER — Ambulatory Visit
Admission: RE | Admit: 2012-04-20 | Discharge: 2012-04-20 | Disposition: A | Discharge status: Home / Self Care | Payer: Medicare Other | Source: Ambulatory Visit | Attending: Radiation Oncology | Admitting: Radiation Oncology

## 2012-04-20 VITALS — BP 155/69 | HR 69 | Temp 97.7°F | Wt 161.3 lb

## 2012-04-20 DIAGNOSIS — C50912 Malignant neoplasm of unspecified site of left female breast: Secondary | ICD-10-CM

## 2012-04-20 NOTE — Progress Notes (Signed)
[facility]    Radiation Oncology [address] [place]     [name], M.D. [place], [place] [address]               Billie Lade, M.D., Ph.D. Phone: [phone]      Molli Hazard A. Kathrynn Running, M.D. Fax: [phone]      Radene Gunning, M.D., Ph.D.         Lurline Hare, M.D.         Grayland Jack, M.D Weekly Treatment Management Note  Name: Melissa Morton     MRN: [id number]        CSN: [id number] Date: 04/20/2012      DOB: 1933-08-20  CC: Melissa Blamer, MD         Melissa Morton    Status: Outpatient  Diagnosis: The encounter diagnosis was Breast cancer, left breast.  Current Dose: 48.6 Gy  Current Fraction: 27/35  Planned Dose: 64.4 Gy  Narrative: [name] was seen today for weekly treatment management. The chart was checked and port films  were reviewed. She is having some fatigue. She is also having some itching within the treatment area,  the upper inner aspect of the breast. She also has occasional discomfort for which she takes tramadol  Hydromorphone hcl; Nsaids; and Prednisone  Current Outpatient Prescriptions  Medication Sig Dispense Refill  . albuterol (PROVENTIL) (2.5 MG/3ML) 0.083% nebulizer solution Take 2.5 mg by nebulization as needed.      Marland Kitchen alendronate (FOSAMAX) 70 MG tablet Take 70 mg by mouth every 7 (seven) days. On wednesday.   Take with a full glass of water on an empty stomach.      Marland Kitchen allopurinol (ZYLOPRIM) 300 MG tablet Take 300 mg by mouth daily.      Marland Kitchen anastrozole (ARIMIDEX) 1 MG tablet Take 1 tablet (1 mg total) by mouth daily.  90 tablet  3  . aspirin EC 81 MG tablet Take 81 mg by mouth daily.      . calcium-vitamin D (OSCAL WITH D) 500-200 MG-UNIT per tablet Take 1 tablet by mouth 2 (two) times daily.      . colchicine 0.6 MG tablet Take 0.6 mg by mouth as needed.      . furosemide (LASIX) 40 MG tablet Take 40 mg by mouth daily.      Marland Kitchen losartan (COZAAR) 100 MG tablet Take 100 mg by mouth daily.      . pantoprazole (PROTONIX)  40 MG tablet Take 40 mg by mouth daily.      . polycarbophil (FIBERCON) 625 MG tablet Take 625 mg by mouth daily.      . potassium chloride SA (K-DUR,KLOR-CON) 20 MEQ tablet Take 20 mEq by mouth daily.      . simvastatin (ZOCOR) 40 MG tablet Take 40 mg by mouth every evening.      . solifenacin (VESICARE) 10 MG tablet Take 10 mg by mouth daily.      . traMADol (ULTRAM) 50 MG tablet Take 50 mg by mouth as needed.       Labs:  Lab Results  Component Value Date   WBC 7.8 12/30/2011   HGB 12.0 12/30/2011   HCT 37.4 12/30/2011   MCV 87.6 12/30/2011   PLT 273 12/30/2011   Lab Results  Component Value Date   CREATININE 1.5* 12/30/2011   BUN 24.0 12/30/2011   NA 141 12/30/2011   K 5.1 12/30/2011   CL 105 12/30/2011   CO2 28 12/30/2011  Lab Results  Component Value Date   ALT 10 12/30/2011   AST 16 12/30/2011   BILITOT 0.50 12/30/2011    Physical Examination:  weight is 161 lb 4.8 oz (73.165 kg). Her temperature is 97.7 F (36.5 C). Her blood pressure is 155/69 and her pulse is 69.    Wt Readings from Last 3 Encounters:  04/20/12 161 lb 4.8 oz (73.165 kg)  04/12/12 160 lb 4.8 oz (72.712 kg)  04/04/12 165 lb 3.2 oz (74.934 kg)    The left breast area shows hyperpigmentation changes and erythema. There is no moist desquamation at this point. She does have radiation dermatitis in the upper inner aspect of the breast. Lungs - Normal respiratory effort, chest expands symmetrically. Lungs are clear to auscultation, no crackles or wheezes.  Heart has regular rhythm and rate  Abdomen is soft and non tender with normal bowel sounds  Assessment:  Patient tolerating treatments well  Plan: Continue treatment per original radiation prescription

## 2012-04-20 NOTE — Progress Notes (Signed)
   Department of Radiation Oncology  Phone:  [phone] Fax:        [phone]   Simulation note  Today the patient underwent additional planning for radiation therapy directed at the left breast region. The patient's treatment planning CT scan was reviewed and she subsequently had set up of a custom electron cutout field directed at the tumor bed in the left breast region.  The patient will be treated with a custom electron field using 18 MeV  Electrons prescribed to the 95% isodose line. A special port plan is requested for treatment.  -----------------------------------  Billie Lade, PhD, MD

## 2012-04-20 NOTE — Progress Notes (Signed)
Here for weekly assessment of left breast cancer radiation.Has completed 27 treatments.Mild discomfort, more itching in upper mid chest and beneath mammary fold.Generlized fatigue.

## 2012-04-21 ENCOUNTER — Ambulatory Visit
Admission: RE | Admit: 2012-04-21 | Discharge: 2012-04-21 | Disposition: A | Discharge status: Home / Self Care | Payer: Medicare Other | Source: Ambulatory Visit | Attending: Radiation Oncology | Admitting: Radiation Oncology

## 2012-04-24 ENCOUNTER — Ambulatory Visit
Admission: RE | Admit: 2012-04-24 | Discharge: 2012-04-24 | Disposition: A | Discharge status: Home / Self Care | Payer: Medicare Other | Source: Ambulatory Visit | Attending: Radiation Oncology | Admitting: Radiation Oncology

## 2012-04-25 ENCOUNTER — Ambulatory Visit
Admission: RE | Admit: 2012-04-25 | Discharge: 2012-04-25 | Disposition: A | Discharge status: Home / Self Care | Payer: Medicare Other | Source: Ambulatory Visit | Attending: Radiation Oncology | Admitting: Radiation Oncology

## 2012-04-27 ENCOUNTER — Ambulatory Visit
Admission: RE | Admit: 2012-04-27 | Discharge: 2012-04-27 | Disposition: A | Discharge status: Home / Self Care | Payer: Medicare Other | Source: Ambulatory Visit | Attending: Radiation Oncology | Admitting: Radiation Oncology

## 2012-04-27 DIAGNOSIS — C50912 Malignant neoplasm of unspecified site of left female breast: Secondary | ICD-10-CM

## 2012-04-27 NOTE — Progress Notes (Signed)
Patient here for weekly assessment of left breast cancer radiation.Has a follicular rash which she applies hydrocortisone.Increased fatigue relieved with extra naps.

## 2012-04-27 NOTE — Progress Notes (Signed)
[facility]    Radiation Oncology [address] [place]     [name], M.D. [place], [place] [address]               Billie Lade, M.D., Ph.D. Phone: [phone]      Molli Hazard A. Kathrynn Running, M.D. Fax: [phone]      Radene Gunning, M.D., Ph.D.         Lurline Hare, M.D.         Grayland Jack, M.D Weekly Treatment Management Note  Name: Melissa Morton     MRN: [id number]        CSN: [id number] Date: 04/27/2012      DOB: 1934/04/29  CC: [name], MD         Tiburcio Pea    Status: Outpatient  Diagnosis: The encounter diagnosis was Breast cancer, left breast.  Current Dose: 56.4 Gy  Current Fraction: 31  Planned Dose: 64.4 Gy  Narrative: Melissa Morton was seen today for weekly treatment management. The chart was checked and port films  were reviewed. She continues to have problems with itching. I recommended she limit her use of hydrocortisone cream to once in the evening hours. She continues to use Biafine. She does have silvadene but has not used this as of yet. She is having some fatigue.  Hydromorphone hcl; Nsaids; and Prednisone  Current Outpatient Prescriptions  Medication Sig Dispense Refill  . albuterol (PROVENTIL) (2.5 MG/3ML) 0.083% nebulizer solution Take 2.5 mg by nebulization as needed.      Marland Kitchen alendronate (FOSAMAX) 70 MG tablet Take 70 mg by mouth every 7 (seven) days. On wednesday.   Take with a full glass of water on an empty stomach.      Marland Kitchen allopurinol (ZYLOPRIM) 300 MG tablet Take 300 mg by mouth daily.      Marland Kitchen anastrozole (ARIMIDEX) 1 MG tablet Take 1 tablet (1 mg total) by mouth daily.  90 tablet  3  . aspirin EC 81 MG tablet Take 81 mg by mouth daily.      . calcium-vitamin D (OSCAL WITH D) 500-200 MG-UNIT per tablet Take 1 tablet by mouth 2 (two) times daily.      . colchicine 0.6 MG tablet Take 0.6 mg by mouth as needed.      . furosemide (LASIX) 40 MG tablet Take 40 mg by mouth daily.      Marland Kitchen losartan (COZAAR) 100 MG tablet Take 100  mg by mouth daily.      . pantoprazole (PROTONIX) 40 MG tablet Take 40 mg by mouth daily.      . polycarbophil (FIBERCON) 625 MG tablet Take 625 mg by mouth daily.      . potassium chloride SA (K-DUR,KLOR-CON) 20 MEQ tablet Take 20 mEq by mouth daily.      . simvastatin (ZOCOR) 40 MG tablet Take 40 mg by mouth every evening.      . solifenacin (VESICARE) 10 MG tablet Take 10 mg by mouth daily.      . traMADol (ULTRAM) 50 MG tablet Take 50 mg by mouth as needed.        The left breast area shows erythema and hyperpigmentation changes but no moist desquamation. Lungs - Normal respiratory effort, chest expands symmetrically. Lungs are clear to auscultation, no crackles or wheezes.  Heart has regular rhythm and rate  Abdomen is soft and non tender with normal bowel sounds  Assessment:  Patient tolerating treatments well  Plan: Continue treatment  per original radiation prescription

## 2012-04-28 ENCOUNTER — Ambulatory Visit
Admission: RE | Admit: 2012-04-28 | Discharge: 2012-04-28 | Disposition: A | Discharge status: Home / Self Care | Payer: Medicare Other | Source: Ambulatory Visit | Attending: Radiation Oncology | Admitting: Radiation Oncology

## 2012-05-01 ENCOUNTER — Ambulatory Visit
Admission: RE | Admit: 2012-05-01 | Discharge: 2012-05-01 | Disposition: A | Discharge status: Home / Self Care | Payer: Medicare Other | Source: Ambulatory Visit | Attending: Radiation Oncology | Admitting: Radiation Oncology

## 2012-05-01 DIAGNOSIS — C50919 Malignant neoplasm of unspecified site of unspecified female breast: Secondary | ICD-10-CM | POA: Insufficient documentation

## 2012-05-02 ENCOUNTER — Encounter: Payer: Self-pay | Admitting: Radiation Oncology

## 2012-05-02 ENCOUNTER — Ambulatory Visit
Admission: RE | Admit: 2012-05-02 | Discharge: 2012-05-02 | Disposition: A | Discharge status: Home / Self Care | Payer: Medicare Other | Source: Ambulatory Visit | Attending: Radiation Oncology | Admitting: Radiation Oncology

## 2012-05-02 VITALS — BP 164/83 | HR 83 | Resp 18 | Wt 162.7 lb

## 2012-05-02 DIAGNOSIS — C50912 Malignant neoplasm of unspecified site of left female breast: Secondary | ICD-10-CM

## 2012-05-02 MED ORDER — RADIAPLEXRX EX GEL
Freq: Once | CUTANEOUS | Status: AC
  Administered 2012-05-02: 08:00:00 via TOPICAL

## 2012-05-02 NOTE — Progress Notes (Signed)
Weekly Management Note Current Dose: 64.4  Gy  Projected Dose:64.4  Gy   Narrative:  The patient presents for routine under treatment assessment.  CBCT/MVCT images/Port film x-rays were reviewed.  The chart was checked. Completes RT today. Using radiaplex.  Appt with med onc January 16th.   Physical Findings: Weight: 162 lb 11.2 oz (73.8 kg). Moist desquamation in inframammary fold but no pain. Rest of breast is dark but healing.  Impression:  The patient finishes RT today.   Plan:  Skin care discussed. Fu in 1 month. F/u with med onc in 2 weeks.

## 2012-05-02 NOTE — Progress Notes (Signed)
Patient presents to the clinic today accompanied by her daughter for a PUT with Dr. Michell Heinrich following final XRT to left breast. Patient alert and oriented to person, place, and time. No distress noted. Steady gait noted. Pleasant affect noted. Patient denies pain at this time. Hyperpigmentation of left breast with mild radiation dermatitis noted at chest wall. Patient reports using radiaplex and hydrocortisone as directed. Encouraged patient to continue use of radiaplex bid for two weeks. Provided patient with additional tube of radiaplex. Provided patient with fynn and abc flyer then, reviewed pertinent information. Reinforced skin care. All questions answered. Provided patient with appt card to return in one month for follow up. Encouraged patient to contact staff with needs. Patient verbalized understanding of all reviewed. Reported all findings to Dr. Michell Heinrich.

## 2012-05-04 ENCOUNTER — Ambulatory Visit: Payer: Medicare Other | Admitting: Radiation Oncology

## 2012-05-05 ENCOUNTER — Encounter (HOSPITAL_COMMUNITY): Payer: Self-pay | Admitting: Emergency Medicine

## 2012-05-05 ENCOUNTER — Emergency Department (HOSPITAL_COMMUNITY)
Admission: EM | Admit: 2012-05-05 | Discharge: 2012-05-05 | Disposition: A | Discharge status: Home / Self Care | Payer: No Typology Code available for payment source | Attending: Emergency Medicine | Admitting: Emergency Medicine

## 2012-05-05 ENCOUNTER — Emergency Department (HOSPITAL_COMMUNITY): Payer: No Typology Code available for payment source

## 2012-05-05 DIAGNOSIS — Z79899 Other long term (current) drug therapy: Secondary | ICD-10-CM | POA: Insufficient documentation

## 2012-05-05 DIAGNOSIS — Y9301 Activity, walking, marching and hiking: Secondary | ICD-10-CM | POA: Insufficient documentation

## 2012-05-05 DIAGNOSIS — Y9229 Other specified public building as the place of occurrence of the external cause: Secondary | ICD-10-CM | POA: Insufficient documentation

## 2012-05-05 DIAGNOSIS — E785 Hyperlipidemia, unspecified: Secondary | ICD-10-CM | POA: Insufficient documentation

## 2012-05-05 DIAGNOSIS — S0083XA Contusion of other part of head, initial encounter: Secondary | ICD-10-CM

## 2012-05-05 DIAGNOSIS — Z87448 Personal history of other diseases of urinary system: Secondary | ICD-10-CM | POA: Insufficient documentation

## 2012-05-05 DIAGNOSIS — R110 Nausea: Secondary | ICD-10-CM | POA: Insufficient documentation

## 2012-05-05 DIAGNOSIS — W19XXXA Unspecified fall, initial encounter: Secondary | ICD-10-CM

## 2012-05-05 DIAGNOSIS — W010XXA Fall on same level from slipping, tripping and stumbling without subsequent striking against object, initial encounter: Secondary | ICD-10-CM | POA: Insufficient documentation

## 2012-05-05 DIAGNOSIS — Z7982 Long term (current) use of aspirin: Secondary | ICD-10-CM | POA: Insufficient documentation

## 2012-05-05 DIAGNOSIS — S8001XA Contusion of right knee, initial encounter: Secondary | ICD-10-CM

## 2012-05-05 DIAGNOSIS — Z923 Personal history of irradiation: Secondary | ICD-10-CM | POA: Insufficient documentation

## 2012-05-05 DIAGNOSIS — M1711 Unilateral primary osteoarthritis, right knee: Secondary | ICD-10-CM

## 2012-05-05 DIAGNOSIS — I10 Essential (primary) hypertension: Secondary | ICD-10-CM | POA: Insufficient documentation

## 2012-05-05 DIAGNOSIS — S1093XA Contusion of unspecified part of neck, initial encounter: Secondary | ICD-10-CM | POA: Insufficient documentation

## 2012-05-05 DIAGNOSIS — Z853 Personal history of malignant neoplasm of breast: Secondary | ICD-10-CM | POA: Insufficient documentation

## 2012-05-05 DIAGNOSIS — Z87891 Personal history of nicotine dependence: Secondary | ICD-10-CM | POA: Insufficient documentation

## 2012-05-05 DIAGNOSIS — S8002XA Contusion of left knee, initial encounter: Secondary | ICD-10-CM

## 2012-05-05 DIAGNOSIS — K219 Gastro-esophageal reflux disease without esophagitis: Secondary | ICD-10-CM | POA: Insufficient documentation

## 2012-05-05 DIAGNOSIS — M1710 Unilateral primary osteoarthritis, unspecified knee: Secondary | ICD-10-CM | POA: Insufficient documentation

## 2012-05-05 DIAGNOSIS — S8000XA Contusion of unspecified knee, initial encounter: Secondary | ICD-10-CM | POA: Insufficient documentation

## 2012-05-05 DIAGNOSIS — S0003XA Contusion of scalp, initial encounter: Secondary | ICD-10-CM | POA: Insufficient documentation

## 2012-05-05 DIAGNOSIS — M1712 Unilateral primary osteoarthritis, left knee: Secondary | ICD-10-CM

## 2012-05-05 DIAGNOSIS — Z8719 Personal history of other diseases of the digestive system: Secondary | ICD-10-CM | POA: Insufficient documentation

## 2012-05-05 MED ORDER — HYDROCODONE-ACETAMINOPHEN 5-325 MG PO TABS
2.0000 | ORAL_TABLET | Freq: Once | ORAL | Status: AC
  Administered 2012-05-05: 2 via ORAL
  Filled 2012-05-05: qty 2

## 2012-05-05 MED ORDER — TRAMADOL HCL 50 MG PO TABS: 100.0000 mg | ORAL_TABLET | Freq: Four times a day (QID) | ORAL | Status: DC | PRN

## 2012-05-05 NOTE — ED Notes (Signed)
UJW:JXBJ<YN> Expected date:05/05/12<BR> Expected time: 1:59 PM<BR> Means of arrival:Ambulance<BR> Comments:<BR> Fall

## 2012-05-05 NOTE — ED Provider Notes (Signed)
History     CSN: [id number]  Arrival date & time 05/05/12  1410   First MD Initiated Contact with Patient 05/05/12 1459      Chief Complaint  Patient presents with  . Fall  . Facial Pain    (Consider location/radiation/quality/duration/timing/severity/associated sxs/prior treatment) HPI  Patient reports she just finished radiation therapy 3 days ago. Today she was walking into [employer] and the rug was curled up at the door entrance which she did not see and she tripped and fell forward hitting her face and her knees. She denies loss of consciousness. She complains of pain to her left cheek and both knees. She denies any visual changes. She states she had nausea when she first fell however that has resolved. She denies any other injury.  PCP Dr. Durwin Nora  Past Medical History  Diagnosis Date  . Reflux   . Diverticulitis   . Hypertension   . Hyperlipemia   . Shortness of breath   . GERD (gastroesophageal reflux disease)   . PONV (postoperative nausea and vomiting)     has used scop patch-that helped  . Wears glasses   . Full dentures   . Breast cancer, left breast 12/07/2011  . Breast cancer, left breast 11/24/11    left breast 4 o'clock bx=invasive ductal ca,grade II,ER/PR=+  . Renal insufficiency 11/09/2011  . Arthritis     knee and hands  . Allergy     seasonal allergies  . HOH (hard of hearing)     right ear     Past Surgical History  Procedure Date  . Knee surgery 2000    rt   . Cholecystectomy   . Tubal ligation   . Bladder suspension   . Tonsillectomy   . Fracture surgery     lt foot  . Eye surgery     both cataracts  . Breast lumpectomy 01/04/2012    left,invasive ductcl ca, grade II,lloq, lymphovascular and perineural invaion ,DCIS,  . Abdominal hysterectomy 89    total    Family History  Problem Relation Age of Onset  . Breast cancer Sister 22  . Leukemia Sister   . Heart disease Father   . Breast cancer Maternal Aunt 36  . Lung cancer Cousin      smoker    History  Substance Use Topics  . Smoking status: Former Smoker -- 0.2 packs/day for 20 years    Types: Cigarettes    Quit date: 11/09/1991  . Smokeless tobacco: Never Used  . Alcohol Use: Yes     Comment: socially  Lives at home Lives with daughter  OB History    Grav Para Term Preterm Abortions TAB SAB Ect Mult Living                  Review of Systems  All other systems reviewed and are negative.    Allergies  Hydromorphone hcl; Nsaids; and Prednisone  Home Medications   Current Outpatient Rx  Name  Route  Sig  Dispense  Refill  . ALBUTEROL SULFATE (2.5 MG/3ML) 0.083% IN NEBU   Nebulization   Take 2.5 mg by nebulization as needed.         . ALENDRONATE SODIUM 70 MG PO TABS   Oral   Take 70 mg by mouth every 7 (seven) days. On wednesday.   Take with a full glass of water on an empty stomach.         . ALLOPURINOL 300 MG PO TABS  Oral   Take 300 mg by mouth daily.         . ANASTROZOLE 1 MG PO TABS   Oral   Take 1 tablet (1 mg total) by mouth daily.   90 tablet   3     Written prescription given to patient on 10/10/201 ...   . ASPIRIN EC 81 MG PO TBEC   Oral   Take 81 mg by mouth daily.         Marland Kitchen CALCIUM CARBONATE-VITAMIN D 500-200 MG-UNIT PO TABS   Oral   Take 1 tablet by mouth 2 (two) times daily.         . COLCHICINE 0.6 MG PO TABS   Oral   Take 0.6 mg by mouth as needed. gout         . LOSARTAN POTASSIUM 100 MG PO TABS   Oral   Take 100 mg by mouth daily.         Marland Kitchen PANTOPRAZOLE SODIUM 40 MG PO TBEC   Oral   Take 40 mg by mouth daily.         Marland Kitchen CALCIUM POLYCARBOPHIL 625 MG PO TABS   Oral   Take 625 mg by mouth daily.         Marland Kitchen POTASSIUM CHLORIDE CRYS ER 20 MEQ PO TBCR   Oral   Take 20 mEq by mouth daily.         Marland Kitchen SIMVASTATIN 40 MG PO TABS   Oral   Take 40 mg by mouth every evening.         Marland Kitchen SOLIFENACIN SUCCINATE 10 MG PO TABS   Oral   Take 10 mg by mouth daily.         . TRAMADOL HCL  50 MG PO TABS   Oral   Take 50 mg by mouth as needed.         Marland Kitchen RADIAPLEX EX   Apply externally   Apply topically.           BP 168/85  Pulse 79  Temp 97.8 F (36.6 C) (Oral)  Resp 16  SpO2 100%  Vital signs normal    Physical Exam  Nursing note and vitals reviewed. Constitutional: She is oriented to person, place, and time. She appears well-developed and well-nourished.  Non-toxic appearance. She does not appear ill. No distress.  HENT:  Head: Normocephalic.    Right Ear: External ear normal.  Left Ear: External ear normal.  Nose: Nose normal. No mucosal edema or rhinorrhea.  Mouth/Throat: Oropharynx is clear and moist and mucous membranes are normal. No dental abscesses or uvula swelling.       Patient has small bruising on her cheek area beneath the left eye with tenderness to palpation. There is also swelling in the subcutaneous tissue. No obvious step-offs felt. The rest of her facial bones are nontender to palpation.  Eyes: Conjunctivae normal and EOM are normal. Pupils are equal, round, and reactive to light.  Neck: Normal range of motion and full passive range of motion without pain. Neck supple.  Cardiovascular: Normal rate, regular rhythm and normal heart sounds.  Exam reveals no gallop and no friction rub.   No murmur heard. Pulmonary/Chest: Effort normal and breath sounds normal. No respiratory distress. She has no wheezes. She has no rhonchi. She has no rales. She exhibits no tenderness and no crepitus.  Abdominal: Soft. Normal appearance and bowel sounds are normal. She exhibits no distension. There is no tenderness. There is  no rebound and no guarding.  Musculoskeletal: Normal range of motion. She exhibits no edema and no tenderness.       Legs:      Patient has a small area of bruising to both anterior knees. The left is tender just lateral to the patellar tendon, the right is tender just medial to the patellar tendon. There is mild swelling. There is no  joint effusions noted.  Neurological: She is alert and oriented to person, place, and time. She has normal strength. No cranial nerve deficit.  Skin: Skin is warm, dry and intact. No rash noted. No erythema. No pallor.  Psychiatric: She has a normal mood and affect. Her speech is normal and behavior is normal. Her mood appears not anxious.    ED Course  Procedures (including critical care time)   Medications  traMADol (ULTRAM) 50 MG tablet (not administered)  HYDROcodone-acetaminophen (NORCO/VICODIN) 5-325 MG per tablet 2 tablet (2 tablet Oral Given 05/05/12 1554)   Pt was able to ambulate to the bathroom without assistance. Pt does not have an orthopedist. She states she does have pain in her knees c/w the arthritis seen on xray.    Dg Knee Complete 4 Views Left  05/05/2012  *RADIOLOGY REPORT*  Clinical Data: Fall.  Pain.  LEFT KNEE - COMPLETE 4+ VIEW  Comparison: None.  Findings: There is a small knee joint effusion.  There is mild medial compartment degenerative change with slight joint space narrowing.  There is mild degenerative changes at the tibiofibular articulation.  No acute fracture or other focal lesion.  IMPRESSION: Small joint effusion.  Mild medial compartment and tibiofibular joint degenerative changes.   Original Report Authenticated By: Paulina Fusi, M.D.    Dg Knee Complete 4 Views Right  05/05/2012  *RADIOLOGY REPORT*  Clinical Data: Fall.  Pain.  RIGHT KNEE - COMPLETE 4+ VIEW  Comparison: 03/12/2004  Findings: There is a small knee joint effusion.  There is tricompartmental osteoarthritis with joint space narrowing and marginal osteophytes.  No evidence of fracture or other focal lesion.  IMPRESSION: Small effusion.  Tricompartmental osteoarthritis.  No identifiably acute finding.   Original Report Authenticated By: Paulina Fusi, M.D.    Ct Head Wo Contrast Ct Maxillofacial Wo Cm  05/05/2012  *RADIOLOGY REPORT*  Clinical Data:  Fall, trauma, bilateral facial pain  CT HEAD  WITHOUT CONTRAST CT MAXILLOFACIAL WITHOUT CONTRAST  Technique:  Multidetector CT imaging of the head and maxillofacial structures were performed using the standard protocol without intravenous contrast. Multiplanar CT image reconstructions of the maxillofacial structures were also generated.  Comparison:   None.  CT HEAD  Findings: Mild diffuse cortical volume loss noted with periventricular white matter hypodensity most likely small vessel ischemic change. No acute hemorrhage, acute infarction, or mass lesion is seen.  No midline shift.  Right ethmoid sinus mucous retention cyst or polyp noted.  No skull fracture.  IMPRESSION: Mild cortical volume loss and evidence of probable small vessel ischemic change, no acute intracranial finding.  CT MAXILLOFACIAL  Findings:   Orbits are unremarkable.  Right ethmoid sinus mucous retention cyst or polyp noted. Soft tissue density in the bilateral external auditory canals likely represents cerumen.  Mastoid air cells are aerated.  The zygomatic arches are intact.  No sinus air fluid level.  The patient is edentulous.  Mandibular condyles are properly located.  Soft tissue swelling is noted in the left infraorbital region.  No underlying fracture is identified. Right sphenoid sinusitis noted with osseous sclerosis which may  indicate chronicity.  IMPRESSION: Left infraorbital soft tissue swelling but no underlying fracture identified.  Right sphenoid sinusitis, with a probable chronic component given the presence of osseous sclerosis but no destruction.   Original Report Authenticated By: Christiana Pellant, M.D.     1. Contusion of face   2. Contusion of knee, left   3. Contusion of knee, right   4. Arthritis of knee, left   5. Arthritis of knee, right   6. Fall     New Prescriptions   TRAMADOL (ULTRAM) 50 MG TABLET    Take 2 tablets (100 mg total) by mouth every 6 (six) hours as needed for pain.   Plan discharge  Devoria Albe, MD, FACEP    MDM           Ward Givens, MD 05/05/12 [phone]

## 2012-05-05 NOTE — ED Notes (Signed)
Pt tripped on the rug at [employer] and fell hit bil knees and facial cheek area. No loc, pain to face lt cheek area, no blood thinners, alert x4,

## 2012-05-06 ENCOUNTER — Encounter: Payer: Self-pay | Admitting: Radiation Oncology

## 2012-05-06 NOTE — Progress Notes (Signed)
  Radiation Oncology         [phone]) [phone] ________________________________  Name: Melissa Morton MRN: [id number]  Date: 05/06/2012  DOB: 13-Jul-1933  End of Treatment Note  Diagnosis:   Stage I left breast cancer     Indication for treatment:  Breast conservation       Radiation treatment dates:   03/14/2012 through 05/02/2012  Site/dose:   Left breast.thousand 5040 cGy in 28 fractions, the site of presentation in the lower outer quadrant was boosted to a  dose of 6440 cGy  Beams/energy:   Tangential beams encompass in the left breast. Deep inspiration and breath hold techniques were used in light of the close proximity of the heart to the left chest wall area. The boost field was with 18 MeV electrons  Narrative: The patient tolerated radiation treatment relatively well.   She did have some itching and discomfort towards the end of her therapy. She did not develop significant moist desquamation but was given Silvadene in the event that this did develop after completion of her treatment  Plan: The patient has completed radiation treatment. The patient will return to radiation oncology clinic for routine followup in one month. I advised them to call or return sooner if they have any questions or concerns related to their recovery or treatment.  -----------------------------------  Billie Lade, PhD, MD

## 2012-05-18 ENCOUNTER — Ambulatory Visit (HOSPITAL_BASED_OUTPATIENT_CLINIC_OR_DEPARTMENT_OTHER): Payer: Medicare Other | Admitting: Family

## 2012-05-18 ENCOUNTER — Telehealth: Payer: Self-pay | Admitting: Oncology

## 2012-05-18 ENCOUNTER — Encounter: Payer: Self-pay | Admitting: Family

## 2012-05-18 ENCOUNTER — Ambulatory Visit (HOSPITAL_BASED_OUTPATIENT_CLINIC_OR_DEPARTMENT_OTHER): Payer: Medicare Other | Admitting: Lab

## 2012-05-18 ENCOUNTER — Other Ambulatory Visit (HOSPITAL_BASED_OUTPATIENT_CLINIC_OR_DEPARTMENT_OTHER): Payer: Medicare Other | Admitting: Lab

## 2012-05-18 VITALS — BP 171/81 | HR 63 | Temp 97.0°F | Resp 20 | Ht 64.0 in | Wt 163.1 lb

## 2012-05-18 DIAGNOSIS — C50519 Malignant neoplasm of lower-outer quadrant of unspecified female breast: Secondary | ICD-10-CM

## 2012-05-18 DIAGNOSIS — C50912 Malignant neoplasm of unspecified site of left female breast: Secondary | ICD-10-CM

## 2012-05-18 DIAGNOSIS — D72829 Elevated white blood cell count, unspecified: Secondary | ICD-10-CM

## 2012-05-18 DIAGNOSIS — Z170 Estrogen receptor positive status [ER+]: Secondary | ICD-10-CM

## 2012-05-18 LAB — CBC WITH DIFFERENTIAL/PLATELET
BASO%: 0.5 % (ref 0.0–2.0)
EOS%: 3.7 % (ref 0.0–7.0)
MCH: 29.5 pg (ref 25.1–34.0)
MCHC: 33.1 g/dL (ref 31.5–36.0)
RDW: 14.7 % — ABNORMAL HIGH (ref 11.2–14.5)
lymph#: 1.3 10*3/uL (ref 0.9–3.3)

## 2012-05-18 LAB — COMPREHENSIVE METABOLIC PANEL (CC13)
ALT: 12 U/L (ref 0–55)
Alkaline Phosphatase: 78 U/L (ref 40–150)
Glucose: 87 mg/dl (ref 70–99)
Sodium: 143 mEq/L (ref 136–145)
Total Bilirubin: 0.67 mg/dL (ref 0.20–1.20)
Total Protein: 7 g/dL (ref 6.4–8.3)

## 2012-05-18 NOTE — Telephone Encounter (Signed)
Pt is aware of genetic consult on March 2014 with labs

## 2012-05-18 NOTE — Progress Notes (Signed)
Patient ID: Melissa Morton, female   DOB: 1933/09/22, 79 y.o.   MRN: [id number] CSN: [id number]  CC: Melissa Quitter, MD  [name], MD Freddrick March. Tenny Craw, MD  Dyke Maes, MD Billie Lade, Ph.D., MD  Problem List: Melissa Morton is a 79 y.o. Caucasian female with a problem list consisting of:  1.  Stage I, T1c N0 M0,  1.3 cm. invasive ductal carcinoma in situ, grade 2.  The tumor was ER 100%, PR 100% positive, HER-  2/neu negative. HER-2/neu was negative by FISH.  Ki67 proliferation marker was 14%. There was evidence of lymphovascular and perineural  invasion. The tumor was 2 mm from the nearest margin inferiorly. None of the 3 lymph nodes sampled had evidence of tumor. She underwent a lumpectomy with sentinel node dissection on 01/04/2012.  She received 35 radiation treatments under the direction of Dr. Roselind Messier that was completed on 05/02/2012.  She began taking Arimidex 1 mg PO daily on 05/06/2012.  2.  Overactive bladder 3.  Gout 4.  Hyperlipidemia 5.  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  HTN 7.  Osteopenia 8.  Arthritis 9.  Impaired renal functioning 10. GERD  Dr. Arline Asp and I saw Melissa Morton, a pleasant 79 year old Caucasian woman who is status post partial lumpectomy of the left breast with left sentinel axillary lymph node dissection on 01/04/2012 for stage I, T1c N0 M0,  1.3 cm. invasive ductal carcinoma in situ, grade 2.  The tumor was ER 100%, PR 100% positive, HER-2/neu negative. HER-2/neu was negative by FISH.  Ki67 proliferation marker was 14%.  She is accompanied by her daughter [name] for today's office visit. Melissa Morton is establishing care from Dr. Dalene Carrow to Dr. Arline Asp today.  She was last seen by Dr.Odogwu on 02/10/2012.  Since her last office visit Mrs. Jock has completed 35 radiation treatments under the direction of Dr. Roselind Messier. Radiation therapy was completed on 05/02/2012. She started taking Arimidex 1 mg by mouth daily on 05/06/2012. The patient had an  accidental fall at a [employer] on 05/05/2012 and has left facial bruising and left-sided body aches. Other than her body aches the patient states that she is doing well and denies any other symptomatology. Specifically she denies any breast changes, unusual bleeding, fever or, chills, nausea/vomiting/diarrhea or constipation. The patient underwent genetics testing with genetics counselor Maylon Cos, and they are awaiting the results.  [name] stated that she completed bone density testing with her OB/Gyn, Dr. Tenny Craw, but we have not received the results.     Past Medical History: Past Medical History  Diagnosis Date  . Reflux   . Diverticulitis   . Hypertension   . Hyperlipemia   . Shortness of breath   . GERD (gastroesophageal reflux disease)   . PONV (postoperative nausea and vomiting)     has used scop patch-that helped  . Wears glasses   . Full dentures   . Breast cancer, left breast 12/07/2011  . Breast cancer, left breast 11/24/11    left breast 4 o'clock bx=invasive ductal ca,grade II,ER/PR=+  . Renal insufficiency 11/09/2011  . Arthritis     knee and hands  . Allergy     seasonal allergies  . HOH (hard of hearing)     right ear     Surgical History: Past Surgical History  Procedure Date  . Knee surgery 2000    rt   . Cholecystectomy   . Tubal ligation   . Bladder suspension   .  Tonsillectomy   . Fracture surgery     lt foot  . Eye surgery     both cataracts  . Breast lumpectomy 01/04/2012    left,invasive ductcl ca, grade II,lloq, lymphovascular and perineural invaion ,DCIS,  . Abdominal hysterectomy 93    total    Current Medications: Current Outpatient Prescriptions  Medication Sig Dispense Refill  . albuterol (PROVENTIL) (2.5 MG/3ML) 0.083% nebulizer solution Take 2.5 mg by nebulization as needed.      Marland Kitchen alendronate (FOSAMAX) 70 MG tablet Take 70 mg by mouth every 7 (seven) days. On wednesday.   Take with a full glass of water on an empty stomach.      Marland Kitchen  allopurinol (ZYLOPRIM) 300 MG tablet Take 300 mg by mouth daily.      Marland Kitchen anastrozole (ARIMIDEX) 1 MG tablet Take 1 tablet (1 mg total) by mouth daily.  90 tablet  3  . aspirin EC 81 MG tablet Take 81 mg by mouth daily.      . calcium-vitamin D (OSCAL WITH D) 500-200 MG-UNIT per tablet Take 1 tablet by mouth 2 (two) times daily.      . colchicine 0.6 MG tablet Take 0.6 mg by mouth as needed. gout      . losartan (COZAAR) 100 MG tablet Take 100 mg by mouth daily.      . pantoprazole (PROTONIX) 40 MG tablet Take 40 mg by mouth daily.      . polycarbophil (FIBERCON) 625 MG tablet Take 625 mg by mouth daily.      . potassium chloride SA (K-DUR,KLOR-CON) 20 MEQ tablet Take 20 mEq by mouth daily.      . simvastatin (ZOCOR) 40 MG tablet Take 40 mg by mouth every evening.      . solifenacin (VESICARE) 10 MG tablet Take 10 mg by mouth daily.      . traMADol (ULTRAM) 50 MG tablet Take 50 mg by mouth as needed.      . traMADol (ULTRAM) 50 MG tablet Take 2 tablets (100 mg total) by mouth every 6 (six) hours as needed for pain.  20 tablet  0  . Wound Cleansers (RADIAPLEX EX) Apply topically.        Allergies: Allergies  Allergen Reactions  . Hydromorphone Hcl Nausea And Vomiting  . Nsaids     11/09/11-Avoid due to decreased liver function.  . Prednisone Nausea And Vomiting    Family History: Family History  Problem Relation Age of Onset  . Breast cancer Sister 36  . Leukemia Sister   . Heart disease Father   . Breast cancer Maternal Aunt 36  . Lung cancer Cousin     smoker    Social History: History  Substance Use Topics  . Smoking status: Former Smoker -- 0.2 packs/day for 20 years    Types: Cigarettes    Quit date: 11/09/1991  . Smokeless tobacco: Never Used  . Alcohol Use: Yes     Comment: socially    Review of Systems: 10 Point review of systems was completed and is negative except as noted above.   Physical Exam:   Blood pressure 171/81, pulse 63, temperature 97 F (36.1 C),  temperature source Oral, resp. rate 20, height 5\' 4"  (1.626 m), weight 163 lb 1.6 oz (73.982 kg).  General appearance: Alert, cooperative, well nourished, no apparent distress Head: Normocephalic, without obvious abnormality, left facial bruising in various stages of healing Eyes: Arcus senilis, PERRLA, EOMI Nose: Nares, septum and mucosa are normal, no  drainage or sinus tenderness Neck: No adenopathy, supple, symmetrical, trachea midline, thyroid not enlarged, no tenderness Resp: Clear to auscultation bilaterally Cardio: Regular rate and rhythm, S1, S2 normal, no murmur, click, rub or gallop Breasts: Well-healed lumpectomy incision in the left breast, no palpable masses or nipple discharge, hyperpigmentation due to radiation, nipple is retracted, right breast reveals no masses,  nipple discharge  GI: Soft, distended, LLQ tenderness, normoactive bowel sounds, no organomegaly Extremities: Extremities normal, atraumatic, no cyanosis or edema Lymph nodes: Cervical, supraclavicular, and axillary nodes normal Neurologic: Grossly normal   Laboratory Data: Recent Results (from the past 168 hour(s))  CBC WITH DIFFERENTIAL   Collection Time   05/18/12  8:17 AM      Component Value Range   WBC 5.7  3.9 - 10.3 10e3/uL   NEUT# 3.6  1.5 - 6.5 10e3/uL   HGB 13.2  11.6 - 15.9 g/dL   HCT 16.1  09.6 - 04.5 %   Platelets 225  145 - 400 10e3/uL   MCV 89.3  79.5 - 101.0 fL   MCH 29.5  25.1 - 34.0 pg   MCHC 33.1  31.5 - 36.0 g/dL   RBC 4.09  8.11 - 9.14 10e6/uL   RDW 14.7 (*) 11.2 - 14.5 %   lymph# 1.3  0.9 - 3.3 10e3/uL   MONO# 0.5  0.1 - 0.9 10e3/uL   Eosinophils Absolute 0.2  0.0 - 0.5 10e3/uL   Basophils Absolute 0.0  0.0 - 0.1 10e3/uL   NEUT% 63.7  38.4 - 76.8 %   LYMPH% 22.8  14.0 - 49.7 %   MONO% 9.3  0.0 - 14.0 %   EOS% 3.7  0.0 - 7.0 %   BASO% 0.5  0.0 - 2.0 %   nRBC 0  0 - 0 %  COMPREHENSIVE METABOLIC PANEL (CC13)   Collection Time   05/18/12 10:21 AM      Component Value Range    Sodium 143  136 - 145 mEq/L   Potassium 3.9  3.5 - 5.1 mEq/L   Chloride 107  98 - 107 mEq/L   CO2 24  22 - 29 mEq/L   Glucose 87  70 - 99 mg/dl   BUN 78.2  7.0 - 95.6 mg/dL   Creatinine 1.2 (*) 0.6 - 1.1 mg/dL   Total Bilirubin 2.13  0.20 - 1.20 mg/dL   Alkaline Phosphatase 78  40 - 150 U/L   AST 17  5 - 34 U/L   ALT 12  0 - 55 U/L   Total Protein 7.0  6.4 - 8.3 g/dL   Albumin 4.0  3.5 - 5.0 g/dL   Calcium 9.4  8.4 - 08.6 mg/dL    Imaging Studies: 1.  Chest x-ray, 2 view on 09/28/2011 showed no active disease. 2.  CT Angio w/wo contrast on 09/28/2011 showed no explanation for patient's left sided chest pain. Specifically, no evidence of pulmonary embolism or pneumonia.  Possible soft tissue nodule within the inferior aspect of the left breast. If not recently performed, correlation with mammography is recommended. 3.  Complete abdominal US on 11/11/2011 showed no enlargement of the liver or spleen.  Slight biliary ductal dilatation which is felt to be within normal limits for this postcholecystectomy patient. Small left renal stone.  Slightly prominent pancreatic duct, nonspecific.  Otherwise, normal exam. 4. Digital diagnostic unilateral left breast mammogram and [place] left breast on 11/22/2011 showed an  8 x 10 x 13 mm suspicious mass in the lower outer left  breast. Tissue sampling is recommended. This was discussed with the patient and her questions answered. She desires to proceed with ultrasound guided left breast biopsy. Due to patient time constraints, the biopsy has been scheduled for 11/24/2011. 5. [place] core biopsy on 11/25/2011 showed an ultrasound-guided biopsy of suspicious left breast mass. No apparent complications.  Final pathology demonstrates INVASIVE DUCTAL CARCINOMA. Histology correlates with imaging findings.Recommend surgical consultation of the left breast. An appointment with Dr. Jamey Ripa [facility]) has been scheduled for 12/07/2011 and the patient notified. 6.  CT head w/o contrast on 05/05/2012 showed left infraorbital soft tissue swelling but no underlying fracture identified.  Right sphenoid sinusitis, with a probable chronic component given the presence of osseous sclerosis but no destruction. 7. Diagnostic left knee x-ray 4 views on 05/05/2012 showed small joint effusion.  Mild medial compartment and tibiofibular joint degenerative changes. Tricompartmental osteoarthritis.  8.  CT Maxillofacial w/contrast on 05/05/2012 showed left infraorbital soft tissue swelling but no underlying fracture identified.  Right sphenoid sinusitis, with a probable chronic component given the presence of osseous sclerosis but no destruction.     Impression/Plan: Mrs. Manninen is a pleasant 79 year-old woman who is status post partial lumpectomy of the left breast with left sentinel axillary lymph node dissection on 01/04/2012 for a stage I, T1c N0 M0 invasive ductal carcinoma.  She is s/p 35 radiations treatments and she started taking Arimidex 1 mg PO daily on 05/06/2012.  She is doing well with the Arimidex, but is aware of the potential side effects of cardiovascular, dermatological, hot flashes, nausea and vomiting, osteoporosis, vaginal discharge, diarrhea, abnormal liver function testing, thrombophlebitis and in very small cases cataracts.  We plan to see Mrs. Pete again in 4 months (09/14/2012) at which time we will check CBC, CMP, LDH and CA 27.29.  She was encouraged to complete her annual mammogram in March 2014.  An appointment with the genetic counselor will be made to discuss the genetic testing results.   Mrs. Bjorn and her daughter [name] are asked to contact us in the interim if they have any questions or concerns.    Larina Bras, NP-C 05/18/2012, 2:03 PM

## 2012-05-18 NOTE — Telephone Encounter (Signed)
Gave pt appt for lab and MD on May 2014 , pt will genetic counselor on March 2014. Pt sent to labs today

## 2012-05-18 NOTE — Patient Instructions (Addendum)
Please contact us at [phone] if you have any questions or concerns.   Results for orders placed in visit on 05/18/12 (from the past 24 hour(s))  CBC WITH DIFFERENTIAL     Status: Abnormal   Collection Time   05/18/12  8:17 AM      Component Value Range   WBC 5.7  3.9 - 10.3 10e3/uL   NEUT# 3.6  1.5 - 6.5 10e3/uL   HGB 13.2  11.6 - 15.9 g/dL   HCT 56.2  13.0 - 86.5 %   Platelets 225  145 - 400 10e3/uL   MCV 89.3  79.5 - 101.0 fL   MCH 29.5  25.1 - 34.0 pg   MCHC 33.1  31.5 - 36.0 g/dL   RBC 7.84  6.96 - 2.95 10e6/uL   RDW 14.7 (*) 11.2 - 14.5 %   lymph# 1.3  0.9 - 3.3 10e3/uL   MONO# 0.5  0.1 - 0.9 10e3/uL   Eosinophils Absolute 0.2  0.0 - 0.5 10e3/uL   Basophils Absolute 0.0  0.0 - 0.1 10e3/uL   NEUT% 63.7  38.4 - 76.8 %   LYMPH% 22.8  14.0 - 49.7 %   MONO% 9.3  0.0 - 14.0 %   EOS% 3.7  0.0 - 7.0 %   BASO% 0.5  0.0 - 2.0 %   nRBC 0  0 - 0 %   Narrative:    Performed At:  [facility]               [address]               [place], [place] [address]

## 2012-05-19 LAB — CANCER ANTIGEN 27.29: CA 27.29: 16 U/mL (ref 0–39)

## 2012-05-24 ENCOUNTER — Encounter: Payer: Self-pay | Admitting: Oncology

## 2012-05-24 ENCOUNTER — Telehealth: Payer: Self-pay | Admitting: Genetic Counselor

## 2012-05-24 NOTE — Progress Notes (Signed)
A report on this patient was received from [facility]. The sample was collected on 02/17/2012. The final report was dated 05/23/2012.  The test was BreastNext: Analyses of 18 genes associated with hereditary breast cancer. No pathogenic mutations were detected. No clinically significant variants were detected.  The written report will be submitted for scanning.

## 2012-05-24 NOTE — Telephone Encounter (Signed)
Revealed negative genetic test results 

## 2012-05-31 ENCOUNTER — Ambulatory Visit (INDEPENDENT_AMBULATORY_CARE_PROVIDER_SITE_OTHER): Payer: Medicare Other | Admitting: Surgery

## 2012-05-31 ENCOUNTER — Encounter (INDEPENDENT_AMBULATORY_CARE_PROVIDER_SITE_OTHER): Payer: Self-pay | Admitting: Surgery

## 2012-05-31 ENCOUNTER — Encounter: Payer: Self-pay | Admitting: Oncology

## 2012-05-31 VITALS — BP 144/88 | HR 68 | Resp 16 | Ht 64.0 in | Wt 161.0 lb

## 2012-05-31 DIAGNOSIS — Z853 Personal history of malignant neoplasm of breast: Secondary | ICD-10-CM

## 2012-05-31 NOTE — Progress Notes (Signed)
A bone density report dated 02/23/2012 from [facility] showed a T score of the left femoral neck to be -0.9.  T score of the right femoral neck was -0.6.  T score of the radius total was -2.3 and of the radius 33% was -2.5.  Bone mineral density of the radius 33% has been decreasing since 09/28/2005. At that time BMD was 0.717, whereas on 02/23/2012 BMD was 0.658.  The patient is on Arimidex as well as Fosamax, calcium and vitamin D.  The patient has an appointment to see me on 09/14/2012.

## 2012-05-31 NOTE — Patient Instructions (Signed)
Have your mammogram as scheduled and see me in September

## 2012-05-31 NOTE — Progress Notes (Signed)
NAME: Melissa Morton       DOB: 1933/12/10           DATE: 05/31/2012       MRN: [id number]  CC:  Chief Complaint  Patient presents with  . Breast Cancer Long Term Follow Up    check after radiation    Melissa Morton is a 79 y.o.Marland Kitchenfemale who presents for routine followup of her Stage I left breast cancer, IDC, receptor positive, diagnosed in August, 2013 and treated with lumpectomy and SLN on 01/04/12. She has no problems or concerns on either side.  PFSH: She has had no significant changes since the last visit here.  ROS: There have been no significant changes since the last visit here  EXAM:  VS: BP 144/88  Pulse 68  Resp 16  Ht 5\' 4"  (1.626 m)  Wt 161 lb (73.029 kg)  BMI 27.64 kg/m2  General: The patient is alert, oriented, generally healthy appearing, NAD. Mood and affect are normal.  Breasts:  Right is normal, albeit a bit tender. Radiation changes on left with "tan" color and mild areolar edema. Breast is soft, mildly tender.  Lymphatics: She has no axillary or supraclavicular adenopathy on either side.  Extremities: Full ROM of the surgical side with no lymphedema noted.  Data Reviewed: Radiology notes   Impression: Doing well, with no evidence of recurrent cancer or new cancer  Plan: Will see in September, one year from he surgery.

## 2012-06-01 ENCOUNTER — Ambulatory Visit: Payer: Medicare Other | Admitting: Radiation Oncology

## 2012-06-01 ENCOUNTER — Encounter: Payer: Self-pay | Admitting: Genetic Counselor

## 2012-06-08 ENCOUNTER — Encounter: Payer: Self-pay | Admitting: Radiation Oncology

## 2012-06-08 ENCOUNTER — Ambulatory Visit
Admission: RE | Admit: 2012-06-08 | Discharge: 2012-06-08 | Disposition: A | Discharge status: Home / Self Care | Payer: Medicare Other | Source: Ambulatory Visit | Attending: Radiation Oncology | Admitting: Radiation Oncology

## 2012-06-08 VITALS — BP 195/92 | HR 67 | Temp 98.0°F | Resp 18 | Wt 162.2 lb

## 2012-06-08 DIAGNOSIS — C50912 Malignant neoplasm of unspecified site of left female breast: Secondary | ICD-10-CM

## 2012-06-08 NOTE — Progress Notes (Signed)
Patient presents to the clinic today accompanied by her family member for a follow up appointment with Dr. Roselind Messier. Patient alert and oriented to person, place, and time. No distress noted. Steady gait noted. Pleasant affect noted. Patient denies pain at this time. However, patient reports discomfort in both knees related to a fall at [employer] that required her to have cortisone injections. Patient reports only faint hyperpigmentation of the left breast without desquamation. Patient reports using radiaplex bid. Patient reports gradual improvement of her energy level. No edema of left arm noted. Denies nipple discharge. Demonstrates full ROM of left arm. Reported all findings to Dr. Roselind Messier.

## 2012-06-08 NOTE — Progress Notes (Signed)
Radiation Oncology         [phone] ________________________________  Name: Melissa Morton MRN: [id number]  Date: 06/08/2012  DOB: January 04, 1934  Follow-Up Visit Note  CC: [name], MD  [name], MD  Diagnosis:   Stage I left breast cancer  Interval Since Last Radiation:  6 weeks  Narrative:  The patient returns today for routine follow-up.  She is doing well at this time. She has some intermittent soreness in the left breast but no consistent pain. She denies any problems with swelling in her left arm or hand. She did start Arimidex and seems to be tolerating this well. She denies any fatigue at this time.                              ALLERGIES:  is allergic to hydromorphone hcl; nsaids; and prednisone.  Meds: Current Outpatient Prescriptions  Medication Sig Dispense Refill  . albuterol (PROVENTIL) (2.5 MG/3ML) 0.083% nebulizer solution Take 2.5 mg by nebulization as needed.      Marland Kitchen alendronate (FOSAMAX) 70 MG tablet Take 70 mg by mouth every 7 (seven) days. On wednesday.   Take with a full glass of water on an empty stomach.      Marland Kitchen allopurinol (ZYLOPRIM) 300 MG tablet Take 300 mg by mouth daily.      Marland Kitchen anastrozole (ARIMIDEX) 1 MG tablet Take 1 tablet (1 mg total) by mouth daily.  90 tablet  3  . aspirin EC 81 MG tablet Take 81 mg by mouth daily.      . calcium-vitamin D (OSCAL WITH D) 500-200 MG-UNIT per tablet Take 1 tablet by mouth 2 (two) times daily.      . colchicine 0.6 MG tablet Take 0.6 mg by mouth as needed. gout      . furosemide (LASIX) 40 MG tablet Take 40 mg by mouth daily.      Marland Kitchen losartan (COZAAR) 100 MG tablet Take 100 mg by mouth daily.      . pantoprazole (PROTONIX) 40 MG tablet Take 40 mg by mouth daily.      . potassium chloride SA (K-DUR,KLOR-CON) 20 MEQ tablet Take 20 mEq by mouth daily.      . simvastatin (ZOCOR) 40 MG tablet Take 40 mg by mouth every evening.      . solifenacin (VESICARE) 10 MG tablet Take 10 mg by mouth daily.      . traMADol  (ULTRAM) 50 MG tablet Take 50 mg by mouth as needed.        Physical Findings: The patient is in no acute distress. Patient is alert and oriented.  weight is 162 lb 3.2 oz (73.573 kg). Her oral temperature is 98 F (36.7 C). Her blood pressure is 195/92 and her pulse is 67. Her respiration is 18. Marland Kitchen No palpable supraclavicular or axillary adenopathy. The lungs are clear. The heart has regular rhythm and rate. Examination of the right breast reveals no mass or nipple discharge. Examination left breast reveals some mild edema in the nipple areolar complex area. There is some hyperpigmentation changes noted in the breast. Patient's skin is well healed. There is no dominant mass appreciated breast nipple discharge or bleeding.  Lab Findings: Lab Results  Component Value Date   WBC 5.7 05/18/2012   HGB 13.2 05/18/2012   HCT 39.9 05/18/2012   MCV 89.3 05/18/2012   PLT 225 05/18/2012      Impression:  The patient  is recovering from the effects of radiation.  No evidence of recurrence on clinical exam today  Plan:  Routine followup in 6 months  _____________________________________  -----------------------------------  Billie Lade, PhD, MD

## 2012-06-21 ENCOUNTER — Telehealth: Payer: Self-pay | Admitting: Genetic Counselor

## 2012-06-21 NOTE — Telephone Encounter (Signed)
Pt called to r/s genetic appt to 05/01 @ 1 w/Karen Lowell Guitar.  Calendar mailed

## 2012-07-10 ENCOUNTER — Encounter: Payer: Medicare Other | Admitting: Genetic Counselor

## 2012-07-10 ENCOUNTER — Other Ambulatory Visit: Payer: Medicare Other | Admitting: Lab

## 2012-08-31 ENCOUNTER — Ambulatory Visit (HOSPITAL_BASED_OUTPATIENT_CLINIC_OR_DEPARTMENT_OTHER): Payer: Medicare Other | Admitting: Genetic Counselor

## 2012-08-31 ENCOUNTER — Other Ambulatory Visit: Payer: Self-pay | Admitting: Lab

## 2012-08-31 DIAGNOSIS — C50919 Malignant neoplasm of unspecified site of unspecified female breast: Secondary | ICD-10-CM

## 2012-08-31 DIAGNOSIS — C50912 Malignant neoplasm of unspecified site of left female breast: Secondary | ICD-10-CM

## 2012-08-31 NOTE — Progress Notes (Signed)
Patient had been seen in November.  This appointment made by accident.

## 2012-09-14 ENCOUNTER — Ambulatory Visit: Payer: Medicare Other | Admitting: Oncology

## 2012-09-14 ENCOUNTER — Other Ambulatory Visit: Payer: Medicare Other | Admitting: Lab

## 2012-09-26 ENCOUNTER — Encounter: Payer: Self-pay | Admitting: Oncology

## 2012-09-26 ENCOUNTER — Ambulatory Visit (HOSPITAL_BASED_OUTPATIENT_CLINIC_OR_DEPARTMENT_OTHER): Payer: Medicare Other | Admitting: Oncology

## 2012-09-26 ENCOUNTER — Telehealth: Payer: Self-pay | Admitting: Oncology

## 2012-09-26 ENCOUNTER — Other Ambulatory Visit (HOSPITAL_BASED_OUTPATIENT_CLINIC_OR_DEPARTMENT_OTHER): Payer: Medicare Other | Admitting: Lab

## 2012-09-26 VITALS — BP 153/73 | HR 92 | Temp 98.0°F | Resp 18 | Ht 64.0 in | Wt 162.7 lb

## 2012-09-26 DIAGNOSIS — C50919 Malignant neoplasm of unspecified site of unspecified female breast: Secondary | ICD-10-CM

## 2012-09-26 DIAGNOSIS — C50912 Malignant neoplasm of unspecified site of left female breast: Secondary | ICD-10-CM

## 2012-09-26 DIAGNOSIS — N390 Urinary tract infection, site not specified: Secondary | ICD-10-CM

## 2012-09-26 LAB — CBC WITH DIFFERENTIAL/PLATELET
Eosinophils Absolute: 0.2 10*3/uL (ref 0.0–0.5)
HCT: 38.5 % (ref 34.8–46.6)
LYMPH%: 18.9 % (ref 14.0–49.7)
MCV: 93.2 fL (ref 79.5–101.0)
MONO%: 8 % (ref 0.0–14.0)
NEUT#: 6.4 10*3/uL (ref 1.5–6.5)
NEUT%: 69.7 % (ref 38.4–76.8)
Platelets: 256 10*3/uL (ref 145–400)
RBC: 4.13 10*6/uL (ref 3.70–5.45)

## 2012-09-26 LAB — COMPREHENSIVE METABOLIC PANEL (CC13)
AST: 22 U/L (ref 5–34)
Albumin: 3.7 g/dL (ref 3.5–5.0)
BUN: 23.6 mg/dL (ref 7.0–26.0)
CO2: 26 mEq/L (ref 22–29)
Calcium: 9.4 mg/dL (ref 8.4–10.4)
Chloride: 106 mEq/L (ref 98–107)
Creatinine: 1.5 mg/dL — ABNORMAL HIGH (ref 0.6–1.1)
Glucose: 142 mg/dl — ABNORMAL HIGH (ref 70–99)

## 2012-09-26 NOTE — Telephone Encounter (Signed)
Gave pt appt for lab and Md for November 2014

## 2012-09-26 NOTE — Progress Notes (Signed)
This office note has been dictated.  #[id number]

## 2012-09-26 NOTE — Progress Notes (Signed)
CC:   Billie Lade, Ph.D., M.D. [name], M.D. [name], M.D. Freddrick March. Tenny Craw, MD Dyke Maes, M.D.  PROBLEM LIST:   1. Stage I, T1c N0 M0, 1.3 cm. invasive ductal carcinoma in situ, grade 2. The tumor was ER 100%, PR 100% positive, HER- 2/neu negative. HER-2/neu was negative by FISH. Ki67 proliferation marker was 14%. There was evidence of lymphovascular and perineural invasion. The tumor was 2 mm from the nearest margin inferiorly. None of the 3 lymph nodes sampled had evidence of tumor. She underwent a lumpectomy with sentinel node dissection on 01/04/2012. Radiation treatments to the left breast were administered by Dr. Antony Blackbird from 03/14/2012 to 05/02/2012.  The patient received 5040 cGy in 28 fractions plus a boost, for a total of 6440 cGy in 35 fractions. Genetic testing was carried out with the BreastNext gene panel through [employer].  No cancer causing mutations were detected in any of the 18 genes tested. She began taking Arimidex 1 mg PO daily on 05/06/2012.   2. Overactive bladder  3. Gout  4. Hyperlipidemia  5. 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 HTN  7. Osteopenia  8. Arthritis  9. Impaired renal functioning  10. GERD 11. Sigmoid diverticulosis requiring admission in August 2012.   MEDICATIONS:  Reviewed and recorded. Current Outpatient Prescriptions  Medication Sig Dispense Refill  . albuterol (PROVENTIL) (2.5 MG/3ML) 0.083% nebulizer solution Take 2.5 mg by nebulization as needed.      Marland Kitchen alendronate (FOSAMAX) 70 MG tablet Take 70 mg by mouth every 7 (seven) days. On wednesday.   Take with a full glass of water on an empty stomach.      Marland Kitchen allopurinol (ZYLOPRIM) 300 MG tablet Take 300 mg by mouth daily.      Marland Kitchen anastrozole (ARIMIDEX) 1 MG tablet Take 1 tablet (1 mg total) by mouth daily.  90 tablet  3  . aspirin EC 81 MG tablet Take 81 mg by mouth daily.      . calcium-vitamin D (OSCAL WITH D) 500-200 MG-UNIT per tablet Take 1 tablet  by mouth 2 (two) times daily.      . ciprofloxacin (CIPRO) 500 MG tablet 500 mg 2 (two) times daily.       . colchicine 0.6 MG tablet Take 0.6 mg by mouth as needed. gout      . furosemide (LASIX) 40 MG tablet Take 40 mg by mouth daily.      Marland Kitchen losartan (COZAAR) 100 MG tablet Take 100 mg by mouth daily.      . metroNIDAZOLE (FLAGYL) 500 MG tablet 500 mg 3 (three) times daily.       . pantoprazole (PROTONIX) 40 MG tablet Take 40 mg by mouth daily.      . potassium chloride SA (K-DUR,KLOR-CON) 20 MEQ tablet Take 20 mEq by mouth daily.      . RABEprazole (ACIPHEX) 20 MG tablet Take 20 mg by mouth daily.       . simvastatin (ZOCOR) 40 MG tablet Take 40 mg by mouth every evening.      . solifenacin (VESICARE) 10 MG tablet Take 10 mg by mouth daily.      . traMADol (ULTRAM) 50 MG tablet Take 50 mg by mouth as needed.       No current facility-administered medications for this visit.   TREATMENT PROGRAM: -Arimidex 1 mg by mouth daily for an anticipated 5 years. Arimidex was started on 05/06/2012.   SMOKING HISTORY:  The patient is a former smoker.  She stopped smoking in July 1993.   HISTORY:  Melissa Morton is a 79 year old white female being followed here for a T1c invasive ductal carcinoma of the left breast, hormone receptor positive, HER-2/neu negative with negative sentinel lymph nodes status post lumpectomy and radiation treatments.  The patient was started on Arimidex 1 mg daily on 05/06/2012.  She is tolerating Arimidex without any musculoskeletal problems.  A week ago she was having some abdominal pain and was placed on Cipro and Flagyl for presumed diagnosis of diverticulitis.  She has had diverticulitis in the past, back in August 2012.  The patient feels generally well, is without any complaints today.  She was last seen by [place] on 05/18/2012.  She is here today with her daughter, Efraim Kaufmann.  PHYSICAL EXAMINATION:  She looks well.  Weight is 162 pounds 11.2 ounces.  Height 5 feet  4 inches.  Body surface area 1.83 sq m.  Blood pressure today 153/73.  Other vital signs are normal.  There is no scleral icterus.  Mouth and pharynx are benign.  There is no peripheral adenopathy palpable in the neck, supraclavicular or axillary areas. Heart and lungs are normal.  Right breast was normal.  Left breast may be slightly smaller than the right breast with some very slight hyperpigmentation.  Both breasts are soft.  There are no suspicious findings in either breast.  No dimpling or nipple inversion.  No induration.  Back:  No skeletal tenderness.  Abdomen is benign with no organomegaly or masses palpable.  Extremities:  Trace pitting edema. Neurologic exam is normal.  LABORATORY DATA:  Today, white count 9.2, ANC 6.4, hemoglobin 12.9, hematocrit 38.5, platelets 256,000.  Chemistries notable for creatinine of 1.5, BUN 24, glucose 142, albumin 3.7, LDH 200.    IMAGING STUDIES: 1. Chest x-ray, 2 view on 09/28/2011 showed no active disease.  2. CT Angio w/wo contrast on 09/28/2011 showed no explanation for patient's left sided chest pain. Specifically, no evidence of pulmonary embolism or pneumonia. Possible soft tissue nodule within the inferior aspect of the left breast. If not recently performed, correlation with mammography is recommended.  3. Complete abdominal US on 11/11/2011 showed no enlargement of the liver or spleen. Slight biliary ductal dilatation which is felt to be within normal limits for this postcholecystectomy patient. Small left renal stone. Slightly prominent pancreatic duct, nonspecific. Otherwise, normal exam.  4. Digital diagnostic unilateral left breast mammogram and [place] left breast on 11/22/2011 showed an 8 x 10 x 13 mm suspicious mass in the lower outer left breast. Tissue sampling is recommended.   5. [place] core biopsy on 11/25/2011 showed an ultrasound-guided biopsy of suspicious left breast mass. No apparent complications.  Final pathology demonstrates  INVASIVE DUCTAL CARCINOMA. Histology correlates  with imaging findings.   6. A bone density scan dated 02/23/2012 from [facility] showed a T score of the left femoral neck to be -0.9.  T score of the right femoral neck was -0.6.  The T score of the radius total was -2.3. 7. CT head w/o contrast on 05/05/2012 showed left infraorbital soft tissue swelling but no underlying fracture identified. Right sphenoid sinusitis, with a probable chronic component given the presence of osseous sclerosis but no destruction.  8. Diagnostic left knee x-ray 4 views on 05/05/2012 showed small joint effusion. Mild medial compartment and tibiofibular joint degenerative changes. Tricompartmental osteoarthritis.  9. CT Maxillofacial w/contrast on 05/05/2012 showed left infraorbital soft tissue swelling but no  underlying fracture identified. Right sphenoid sinusitis, with a probable chronic component given the presence of osseous sclerosis but no destruction.    IMPRESSION AND PLAN:  Ms. Nemitz seems to be doing well without evidence of recurrent disease now a little bit more than a half a year from the time of diagnosis and treatment.  She was started on Arimidex in early January and was told that she needs to be on this for at least 5 years.  Currently studies are ongoing to see whether longer period of treatment would be beneficial.  The patient is without evidence of recurrence.  I informed her about the results of her bone density scan. She does have some osteopenia involving the radius.  The patient also was informed about her slightly elevated creatinine of 1.5 today.  She has seen Dr. Fidela Salisbury in the past.  I am recommending that the patient have a chest x-ray today as her last chest x-ray was a year ago.  I have requested that she return in 6 months at which time we will check CBC and chemistries.  She is due for bilateral mammograms in September.  Although the patient does not have any  cancer causing mutations I did suggest that the female members of her family, particularly her 2 daughters, need to have mammograms starting at 46 and probably yearly from age 66.    ______________________________ Samul Dada, M.D. DSM/MEDQ  D:  09/26/2012  T:  09/26/2012  Job:  [id number]

## 2012-09-27 LAB — CANCER ANTIGEN 27.29: CA 27.29: 16 U/mL (ref 0–39)

## 2012-09-29 ENCOUNTER — Ambulatory Visit: Payer: Self-pay | Admitting: Oncology

## 2012-09-29 ENCOUNTER — Other Ambulatory Visit: Payer: Self-pay | Admitting: Lab

## 2012-09-30 ENCOUNTER — Ambulatory Visit (HOSPITAL_COMMUNITY)
Admission: RE | Admit: 2012-09-30 | Discharge: 2012-09-30 | Disposition: A | Discharge status: Home / Self Care | Payer: Medicare Other | Source: Ambulatory Visit | Attending: Oncology | Admitting: Oncology

## 2012-09-30 DIAGNOSIS — Z923 Personal history of irradiation: Secondary | ICD-10-CM | POA: Insufficient documentation

## 2012-09-30 DIAGNOSIS — Z9010 Acquired absence of unspecified breast and nipple: Secondary | ICD-10-CM | POA: Insufficient documentation

## 2012-09-30 DIAGNOSIS — C50912 Malignant neoplasm of unspecified site of left female breast: Secondary | ICD-10-CM

## 2012-09-30 DIAGNOSIS — C50919 Malignant neoplasm of unspecified site of unspecified female breast: Secondary | ICD-10-CM | POA: Insufficient documentation

## 2012-10-02 NOTE — Progress Notes (Signed)
Quick Note:  Please notify patient and call/fax these results to patient's doctors. ______ 

## 2012-10-05 ENCOUNTER — Telehealth: Payer: Self-pay

## 2012-10-05 NOTE — Telephone Encounter (Signed)
Pt called for CXR results, "no active disease of the chest"

## 2012-12-07 ENCOUNTER — Ambulatory Visit: Payer: Medicare Other | Admitting: Radiation Oncology

## 2012-12-12 ENCOUNTER — Ambulatory Visit: Payer: Medicare Other | Admitting: Radiation Oncology

## 2012-12-18 ENCOUNTER — Other Ambulatory Visit: Payer: Self-pay | Admitting: Obstetrics and Gynecology

## 2012-12-18 DIAGNOSIS — Z9889 Other specified postprocedural states: Secondary | ICD-10-CM

## 2012-12-18 DIAGNOSIS — Z853 Personal history of malignant neoplasm of breast: Secondary | ICD-10-CM

## 2012-12-21 ENCOUNTER — Encounter: Payer: Self-pay | Admitting: Radiation Oncology

## 2012-12-21 DIAGNOSIS — Z923 Personal history of irradiation: Secondary | ICD-10-CM | POA: Insufficient documentation

## 2012-12-25 ENCOUNTER — Encounter: Payer: Self-pay | Admitting: Radiation Oncology

## 2012-12-25 ENCOUNTER — Ambulatory Visit
Admission: RE | Admit: 2012-12-25 | Discharge: 2012-12-25 | Disposition: A | Discharge status: Home / Self Care | Payer: Medicare Other | Source: Ambulatory Visit | Attending: Radiation Oncology | Admitting: Radiation Oncology

## 2012-12-25 VITALS — BP 166/65 | HR 71 | Temp 97.8°F | Resp 20 | Wt 164.1 lb

## 2012-12-25 DIAGNOSIS — C50912 Malignant neoplasm of unspecified site of left female breast: Secondary | ICD-10-CM

## 2012-12-25 NOTE — Progress Notes (Signed)
Pt denies pain, fatigue, loss of appetite. She has mammogram scheduled for 01/09/13. She is taking Anastrozole daily.

## 2012-12-25 NOTE — Progress Notes (Signed)
Radiation Oncology         [phone] ________________________________  Name: Melissa Morton MRN: [id number]  Date: 12/25/2012  DOB: [birth year]  Follow-Up Visit Note  CC: [name], MD  Laurice Record, MD  Diagnosis:   Stage I left breast cancer  Interval Since Last Radiation:  8  months  Narrative:  The patient returns today for routine follow-up.  She is doing well and without complaints. She denies any pain in the breast area nipple discharge or bleeding. She is tolerating Arimidex well. The patient is scheduled for mammograms next month. Followup in medical oncology in November.                              ALLERGIES:  is allergic to hydromorphone hcl; nsaids; and prednisone.  Meds: Current Outpatient Prescriptions  Medication Sig Dispense Refill  . albuterol (PROVENTIL) (2.5 MG/3ML) 0.083% nebulizer solution Take 2.5 mg by nebulization as needed.      Marland Kitchen alendronate (FOSAMAX) 70 MG tablet Take 70 mg by mouth every 7 (seven) days. On wednesday.   Take with a full glass of water on an empty stomach.      Marland Kitchen allopurinol (ZYLOPRIM) 300 MG tablet Take 300 mg by mouth daily.      Marland Kitchen anastrozole (ARIMIDEX) 1 MG tablet Take 1 tablet (1 mg total) by mouth daily.  90 tablet  3  . aspirin EC 81 MG tablet Take 81 mg by mouth daily.      . calcium-vitamin D (OSCAL WITH D) 500-200 MG-UNIT per tablet Take 1 tablet by mouth 2 (two) times daily.      . ciprofloxacin (CIPRO) 500 MG tablet 500 mg 2 (two) times daily.       . colchicine 0.6 MG tablet Take 0.6 mg by mouth as needed. gout      . furosemide (LASIX) 40 MG tablet Take 40 mg by mouth daily.      Marland Kitchen losartan (COZAAR) 100 MG tablet Take 100 mg by mouth daily.      . metroNIDAZOLE (FLAGYL) 500 MG tablet 500 mg 3 (three) times daily.       . pantoprazole (PROTONIX) 40 MG tablet Take 40 mg by mouth daily.      . potassium chloride SA (K-DUR,KLOR-CON) 20 MEQ tablet Take 20 mEq by mouth daily.      . RABEprazole (ACIPHEX) 20 MG tablet  Take 20 mg by mouth daily.       . simvastatin (ZOCOR) 40 MG tablet Take 40 mg by mouth every evening.      . solifenacin (VESICARE) 10 MG tablet Take 10 mg by mouth daily.      . traMADol (ULTRAM) 50 MG tablet Take 50 mg by mouth as needed.       No current facility-administered medications for this encounter.    Physical Findings: The patient is in no acute distress. Patient is alert and oriented.  weight is 164 lb 1.6 oz (74.435 kg). Her oral temperature is 97.8 F (36.6 C). Her blood pressure is 166/65 and her pulse is 71. Her respiration is 20. Marland Kitchen  No palpable supraclavicular or axillary adenopathy. The lungs are clear to auscultation. The heart has regular rhythm and rate. Examination of the right breast reveals no mass or nipple discharge. Examination of the left breast reveals mild hyperpigmentation changes. There is no dominant mass appreciated in the breast nipple discharge or bleeding.  Lab Findings:  Lab Results  Component Value Date   WBC 9.2 09/26/2012   HGB 12.9 09/26/2012   HCT 38.5 09/26/2012   MCV 93.2 09/26/2012   PLT 256 09/26/2012      Radiographic Findings: No results found.  Impression:  No evidence for recurrence on clinical exam today  Plan:  When necessary followup in radiation oncology. Patient will continue followup in medical oncology and surgery.  _____________________________________  -----------------------------------  Billie Lade, PhD, MD

## 2013-01-09 ENCOUNTER — Ambulatory Visit
Admission: RE | Admit: 2013-01-09 | Discharge: 2013-01-09 | Disposition: A | Discharge status: Home / Self Care | Payer: Medicare Other | Source: Ambulatory Visit | Attending: Obstetrics and Gynecology | Admitting: Obstetrics and Gynecology

## 2013-01-09 DIAGNOSIS — Z9889 Other specified postprocedural states: Secondary | ICD-10-CM

## 2013-01-09 DIAGNOSIS — Z853 Personal history of malignant neoplasm of breast: Secondary | ICD-10-CM

## 2013-02-28 ENCOUNTER — Ambulatory Visit (INDEPENDENT_AMBULATORY_CARE_PROVIDER_SITE_OTHER): Payer: Medicare Other | Admitting: Surgery

## 2013-03-19 ENCOUNTER — Other Ambulatory Visit: Payer: Self-pay | Admitting: Internal Medicine

## 2013-03-19 DIAGNOSIS — C50912 Malignant neoplasm of unspecified site of left female breast: Secondary | ICD-10-CM

## 2013-03-20 ENCOUNTER — Encounter (INDEPENDENT_AMBULATORY_CARE_PROVIDER_SITE_OTHER): Payer: Self-pay

## 2013-03-20 ENCOUNTER — Ambulatory Visit (HOSPITAL_BASED_OUTPATIENT_CLINIC_OR_DEPARTMENT_OTHER): Payer: Medicare Other | Admitting: Internal Medicine

## 2013-03-20 ENCOUNTER — Telehealth: Payer: Self-pay | Admitting: Internal Medicine

## 2013-03-20 ENCOUNTER — Other Ambulatory Visit (HOSPITAL_BASED_OUTPATIENT_CLINIC_OR_DEPARTMENT_OTHER): Payer: Medicare Other | Admitting: Lab

## 2013-03-20 VITALS — BP 184/63 | HR 71 | Temp 98.1°F | Resp 19 | Ht 64.0 in | Wt 168.7 lb

## 2013-03-20 DIAGNOSIS — C50519 Malignant neoplasm of lower-outer quadrant of unspecified female breast: Secondary | ICD-10-CM

## 2013-03-20 DIAGNOSIS — C50912 Malignant neoplasm of unspecified site of left female breast: Secondary | ICD-10-CM

## 2013-03-20 DIAGNOSIS — M810 Age-related osteoporosis without current pathological fracture: Secondary | ICD-10-CM

## 2013-03-20 DIAGNOSIS — Z923 Personal history of irradiation: Secondary | ICD-10-CM

## 2013-03-20 DIAGNOSIS — M899 Disorder of bone, unspecified: Secondary | ICD-10-CM

## 2013-03-20 LAB — COMPREHENSIVE METABOLIC PANEL (CC13)
ALT: 8 U/L (ref 0–55)
Albumin: 3.8 g/dL (ref 3.5–5.0)
Alkaline Phosphatase: 78 U/L (ref 40–150)
Anion Gap: 10 mEq/L (ref 3–11)
CO2: 28 mEq/L (ref 22–29)
Calcium: 9.6 mg/dL (ref 8.4–10.4)
Chloride: 107 mEq/L (ref 98–109)
Creatinine: 1.2 mg/dL — ABNORMAL HIGH (ref 0.6–1.1)
Sodium: 144 mEq/L (ref 136–145)
Total Protein: 6.5 g/dL (ref 6.4–8.3)

## 2013-03-20 LAB — CBC WITH DIFFERENTIAL/PLATELET
BASO%: 0.3 % (ref 0.0–2.0)
Basophils Absolute: 0 10*3/uL (ref 0.0–0.1)
HCT: 36.9 % (ref 34.8–46.6)
MCHC: 33.2 g/dL (ref 31.5–36.0)
MONO#: 0.6 10*3/uL (ref 0.1–0.9)
NEUT%: 71.7 % (ref 38.4–76.8)
RBC: 3.98 10*6/uL (ref 3.70–5.45)
WBC: 8.2 10*3/uL (ref 3.9–10.3)
lymph#: 1.5 10*3/uL (ref 0.9–3.3)

## 2013-03-20 NOTE — Patient Instructions (Signed)
Osteoporosis Throughout your life, your body breaks down old bone and replaces it with new bone. As you get older, your body does not replace bone as quickly as it breaks it down. By the age of 30 years, most people begin to gradually lose bone because of the imbalance between bone loss and replacement. Some people lose more bone than others. Bone loss beyond a specified normal degree is considered osteoporosis.  Osteoporosis affects the strength and durability of your bones. The inside of the ends of your bones and your flat bones, like the bones of your pelvis, look like honeycomb, filled with tiny open spaces. As bone loss occurs, your bones become less dense. This means that the open spaces inside your bones become bigger and the walls between these spaces become thinner. This makes your bones weaker. Bones of a person with osteoporosis can become so weak that they can break (fracture) during minor accidents, such as a simple fall. CAUSES  The following factors have been associated with the development of osteoporosis:  Smoking.  Drinking more than 2 alcoholic drinks several days per week.  Long-term use of certain medicines:  Corticosteroids.  Chemotherapy medicines.  Thyroid medicines.  Antiepileptic medicines.  Gonadal hormone suppression medicine.  Immunosuppression medicine.  Being underweight.  Lack of physical activity.  Lack of exposure to the sun. This can lead to vitamin D deficiency.  Certain medical conditions:  Certain inflammatory bowel diseases, such as Crohn disease and ulcerative colitis.  Diabetes.  Hyperthyroidism.  Hyperparathyroidism. RISK FACTORS Anyone can develop osteoporosis. However, the following factors can increase your risk of developing osteoporosis:  Gender Women are at higher risk than men.  Age Being older than 50 years increases your risk.  Ethnicity White and Asian people have an increased risk.  Weight Being extremely  underweight can increase your risk of osteoporosis.  Family history of osteoporosis Having a family member who has developed osteoporosis can increase your risk. SYMPTOMS  Usually, people with osteoporosis have no symptoms.  DIAGNOSIS  Signs during a physical exam that may prompt your caregiver to suspect osteoporosis include:  Decreased height. This is usually caused by the compression of the bones that form your spine (vertebrae) because they have weakened and become fractured.  A curving or rounding of the upper back (kyphosis). To confirm signs of osteoporosis, your caregiver may request a procedure that uses 2 low-dose X-ray beams with different levels of energy to measure your bone mineral density (dual-energy X-ray absorptiometry [DXA]). Also, your caregiver may check your level of vitamin D. TREATMENT  The goal of osteoporosis treatment is to strengthen bones in order to decrease the risk of bone fractures. There are different types of medicines available to help achieve this goal. Some of these medicines work by slowing the processes of bone loss. Some medicines work by increasing bone density. Treatment also involves making sure that your levels of calcium and vitamin D are adequate. PREVENTION  There are things you can do to help prevent osteoporosis. Adequate intake of calcium and vitamin D can help you achieve optimal bone mineral density. Regular exercise can also help, especially resistance and weight-bearing activities. If you smoke, quitting smoking is an important part of osteoporosis prevention. MAKE SURE YOU:  Understand these instructions.  Will watch your condition.  Will get help right away if you are not doing well or get worse. FOR MORE INFORMATION www.osteo.org and www.nof.org Document Released: 01/27/2005 Document Revised: 08/14/2012 Document Reviewed: 04/03/2011 [employer] Patient Information 2014 [employer].     Bone Densitometry Bone densitometry is a special  X-ray that measures your bone density and can be used to help predict your risk of bone fractures. This test is used to determine bone mineral content and density to diagnose osteoporosis. Osteoporosis is the loss of bone that may cause the bone to become weak. Osteoporosis commonly occurs in women entering menopause. However, it may be found in men and in people with other diseases. PREPARATION FOR TEST No preparation necessary. WHO SHOULD BE TESTED?  All women older than 85.  Postmenopausal women (50 to 5) with risk factors for osteoporosis.  People with a previous fracture caused by normal activities.  People with a small body frame (less than 127 poundsor a body mass index [BMI] of less than 21).  People who have a parent with a hip fracture or history of osteoporosis.  People who smoke.  People who have rheumatoid arthritis.  Anyone who engages in excessive alcohol use (more than 3 drinks most days).  Women who experience early menopause. WHEN SHOULD YOU BE RETESTED? Current guidelines suggest that you should wait at least 2 years before doing a bone density test again if your first test was normal.Recent studies indicated that women with normal bone density may be able to wait a few years before needing to repeat a bone density test. You should discuss this with your caregiver.  NORMAL FINDINGS   Normal: less than standard deviation below normal (greater than -1).  Osteopenia: 1 to 2.5 standard deviations below normal (-1 to -2.5).  Osteoporosis: greater than 2.5 standard deviations below normal (less than -2.5). Test results are reported as a "T score" and a "Z score."The T score is a number that compares your bone density with the bone density of healthy, young women.The Z score is a number that compares your bone density with the scores of women who are the same age, gender, and race.  Ranges for normal findings may vary among different laboratories and hospitals. You  should always check with your doctor after having lab work or other tests done to discuss the meaning of your test results and whether your values are considered within normal limits. MEANING OF TEST  Your caregiver will go over the test results with you and discuss the importance and meaning of your results, as well as treatment options and the need for additional tests if necessary. OBTAINING THE TEST RESULTS It is your responsibility to obtain your test results. Ask the lab or department performing the test when and how you will get your results. Document Released: 05/11/2004 Document Revised: 07/12/2011 Document Reviewed: 06/03/2010 [facility] Patient Information 2014 [place], [place]. Anastrozole tablets What is this medicine? ANASTROZOLE (an AS troe zole) is used to treat breast cancer in women who have gone through menopause. Some types of breast cancer depend on estrogen to grow, and this medicine can stop tumor growth by blocking estrogen production. This medicine may be used for other purposes; ask your health care provider or pharmacist if you have questions. COMMON BRAND NAME(S): Arimidex What should I tell my health care provider before I take this medicine? They need to know if you have any of these conditions: -liver disease -an unusual or allergic reaction to anastrozole, other medicines, foods, dyes, or preservatives -pregnant or trying to get pregnant -breast-feeding How should I use this medicine? Take this medicine by mouth with a glass of water. Follow the directions on the prescription label. You can take this medicine with or without food. Take your  doses at regular intervals. Do not take your medicine more often than directed. Do not stop taking except on the advice of your doctor or health care professional. Talk to your pediatrician regarding the use of this medicine in children. Special care may be needed. Overdosage: If you think you have taken too much of this medicine  contact a poison control center or emergency room at once. NOTE: This medicine is only for you. Do not share this medicine with others. What if I miss a dose? If you miss a dose, take it as soon as you can. If it is almost time for your next dose, take only that dose. Do not take double or extra doses. What may interact with this medicine? Do not take this medicine with any of the following medications: -female hormones, like estrogens or progestins and birth control pills This medicine may also interact with the following medications: -tamoxifen This list may not describe all possible interactions. Give your health care provider a list of all the medicines, herbs, non-prescription drugs, or dietary supplements you use. Also tell them if you smoke, drink alcohol, or use illegal drugs. Some items may interact with your medicine. What should I watch for while using this medicine? Visit your doctor or health care professional for regular checks on your progress. Let your doctor or health care professional know about any unusual vaginal bleeding. Do not treat yourself for diarrhea, nausea, vomiting or other side effects. Ask your doctor or health care professional for advice. What side effects may I notice from receiving this medicine? Side effects that you should report to your doctor or health care professional as soon as possible: -allergic reactions like skin rash, itching or hives, swelling of the face, lips, or tongue -any new or unusual symptoms -breathing problems -chest pain -leg pain or swelling -vomiting Side effects that usually do not require medical attention (report to your doctor or health care professional if they continue or are bothersome): -back or bone pain -cough, or throat infection -diarrhea or constipation -dizziness -headache -hot flashes -loss of appetite -nausea -sweating -weakness and tiredness -weight gain This list may not describe all possible side  effects. Call your doctor for medical advice about side effects. You may report side effects to FDA at 1-800-FDA-1088. Where should I keep my medicine? Keep out of the reach of children. Store at room temperature between 20 and 25 degrees C (68 and 77 degrees F). Throw away any unused medicine after the expiration date. NOTE: This sheet is a summary. It may not cover all possible information. If you have questions about this medicine, talk to your doctor, pharmacist, or health care provider.  2014, Elsevier/Gold Standard. (2007-06-30 16:31:52)

## 2013-03-20 NOTE — Telephone Encounter (Signed)
worked 11/18 POF sw Cordelia Pen at [place] and made appt for Bone Density on 11/25 at 1pm AVS and CAL given to pt shh

## 2013-03-22 NOTE — Progress Notes (Signed)
[facility] OFFICE PROGRESS NOTE  [name], MD [address], [address] [place] [address]  DIAGNOSIS: Breast cancer, left breast - Plan: CBC with Differential, Comprehensive metabolic panel  Osteoporosis - Plan: DG Bone Density  Hx of radiation therapy  Chief Complaint  Patient presents with  . Breast cancer, left breast    CURRENT THERAPY: Arimidex 1 mg by mouth daily for an anticipated 5 years. Arimidex was started on 05/06/2012.  INTERVAL HISTORY: Melissa Morton 79 y.o. female with a history of left breast T1c invasive ductal carcinoma, hormone receptor positive, HER-2/neu negative with negative sentinel lymph nodes status post lumpectomy and radiation is here for follow up.  She was last seen by Dr. Arline Asp on 09/26/2012.  The patient was started on Arimidex 1 mg daily on 05/06/2012. She is tolerating Arimidex without any musculoskeletal problems. The patient feels generally well, is without any  complaints today.  She lives with her daughter.  She remains very active with her grandchildren and great-grandchildren.  She has knee arthritis bilaterally but otherwise denies bone pain or fever or chills.  She denies any recent hospitalizations or emergency room visits.   MEDICAL HISTORY: Past Medical History  Diagnosis Date  . Reflux   . Diverticulitis   . Hypertension   . Hyperlipemia   . Shortness of breath   . GERD (gastroesophageal reflux disease)   . PONV (postoperative nausea and vomiting)     has used scop patch-that helped  . Wears glasses   . Full dentures   . Breast cancer, left breast 12/07/2011  . Breast cancer, left breast 11/24/11    left breast 4 o'clock bx=invasive ductal ca,grade II,ER/PR=+  . Renal insufficiency 11/09/2011  . Arthritis     knee and hands  . Allergy     seasonal allergies  . HOH (hard of hearing)     right ear   . Hx of radiation therapy 03/14/12- 05/02/12    left breast 5040 gray in 28 fx, site of  presentation lower outer quadrant boosted to 6440 gray    INTERIM HISTORY: has Leucocytosis; Renal insufficiency; Gout; Breast cancer, left breast; Hx of radiation therapy; and Osteoporosis on her problem list.    ALLERGIES:  is allergic to hydromorphone hcl; nsaids; and prednisone.  MEDICATIONS: has a current medication list which includes the following prescription(s): albuterol, alendronate, allopurinol, anastrozole, aspirin ec, calcium-vitamin d, ciprofloxacin, colchicine, losartan, pantoprazole, potassium chloride sa, rabeprazole, simvastatin, solifenacin, and tramadol.  SURGICAL HISTORY:  Past Surgical History  Procedure Laterality Date  . Knee surgery  2000    rt   . Cholecystectomy    . Tubal ligation    . Bladder suspension    . Tonsillectomy    . Fracture surgery      lt foot  . Eye surgery      both cataracts  . Breast lumpectomy  01/04/2012    left,invasive ductcl ca, grade II,lloq, lymphovascular and perineural invaion ,DCIS,  . Abdominal hysterectomy  93    total   PROBLEM LIST:  1. Stage I, T1c N0 M0, 1.3 cm. invasive ductal carcinoma in situ, grade 2. The tumor was ER 100%, PR 100% positive, HER- 2/neu negative. HER-2/neu was negative by FISH. Ki67 proliferation marker was 14%. There was evidence of lymphovascular and perineural invasion. The tumor was 2 mm from the nearest margin inferiorly. None of the 3 lymph nodes sampled had evidence of tumor. She underwent a lumpectomy with sentinel node dissection on  01/04/2012. Radiation treatments to the left breast were administered by Dr. Antony Blackbird from 03/14/2012 to 05/02/2012. The patient received 5040 cGy in 28 fractions plus a boost, for a total of 6440 cGy in 35 fractions. Genetic testing was carried out with the BreastNext gene panel through [employer]. No cancer causing mutations were detected in any of the 18 genes tested. She began taking Arimidex 1 mg PO daily on 05/06/2012.  2. Overactive bladder   3. Gout  4. Hyperlipidemia  5. 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 HTN  7. Osteopenia  8. Arthritis  9. Impaired renal functioning  10. GERD  11. Sigmoid diverticulosis requiring admission in August 2012.  REVIEW OF SYSTEMS:   Constitutional: Denies fevers, chills or abnormal weight loss Eyes: Denies blurriness of vision Ears, nose, mouth, throat, and face: Denies mucositis or sore throat Respiratory: Denies cough, dyspnea or wheezes Cardiovascular: Denies palpitation, chest discomfort or lower extremity swelling Gastrointestinal:  Denies nausea, heartburn or change in bowel habits Skin: Denies abnormal skin rashes Lymphatics: Denies new lymphadenopathy or easy bruising Neurological:Denies numbness, tingling or new weaknesses Behavioral/Psych: Mood is stable, no new changes  All other systems were reviewed with the patient and are negative.  PHYSICAL EXAMINATION: ECOG PERFORMANCE STATUS: 0 - Asymptomatic  Blood pressure 184/63, pulse 71, temperature 98.1 F (36.7 C), temperature source Oral, resp. rate 19, height 5\' 4"  (1.626 m), weight 168 lb 11.2 oz (76.522 kg), SpO2 100.00%.  GENERAL:alert, no distress and comfortable SKIN: skin color, texture, turgor are normal, no rashes or significant lesions EYES: normal, Conjunctiva are pink and non-injected, sclera clear OROPHARYNX:no exudate, no erythema and lips, buccal mucosa, and tongue normal  NECK: supple, thyroid normal size, non-tender, without nodularity LYMPH:  no palpable lymphadenopathy in the cervical, axillary or supraclavicular LUNGS: clear to auscultation and percussion with normal breathing effort HEART: regular rate & rhythm and no murmurs and no lower extremity edema BREASTS: Right breast is normal; left breast slightly smaller than the right breast.  There were no suspicious findings or masses or dimpling or nipple inversion or induration.  ABDOMEN:abdomen soft, non-tender and normal bowel sounds Musculoskeletal:no cyanosis of  digits and no clubbing  NEURO: alert & oriented x 3 with fluent speech, no focal motor/sensory deficits  LABORATORY DATA: Results for orders placed in visit on 03/20/13 (from the past 48 hour(s))  CBC WITH DIFFERENTIAL     Status: None   Collection Time    03/20/13  1:14 PM      Result Value Range   WBC 8.2  3.9 - 10.3 10e3/uL   NEUT# 5.9  1.5 - 6.5 10e3/uL   HGB 12.3  11.6 - 15.9 g/dL   HCT 16.1  09.6 - 04.5 %   Platelets 235  145 - 400 10e3/uL   MCV 92.8  79.5 - 101.0 fL   MCH 30.8  25.1 - 34.0 pg   MCHC 33.2  31.5 - 36.0 g/dL   RBC 4.09  8.11 - 9.14 10e6/uL   RDW 14.3  11.2 - 14.5 %   lymph# 1.5  0.9 - 3.3 10e3/uL   MONO# 0.6  0.1 - 0.9 10e3/uL   Eosinophils Absolute 0.2  0.0 - 0.5 10e3/uL   Basophils Absolute 0.0  0.0 - 0.1 10e3/uL   NEUT% 71.7  38.4 - 76.8 %   LYMPH% 18.4  14.0 - 49.7 %   MONO% 7.6  0.0 - 14.0 %   EOS% 2.0  0.0 - 7.0 %   BASO%  0.3  0.0 - 2.0 %  COMPREHENSIVE METABOLIC PANEL (CC13)     Status: Abnormal   Collection Time    03/20/13  1:14 PM      Result Value Range   Sodium 144  136 - 145 mEq/L   Potassium 3.8  3.5 - 5.1 mEq/L   Chloride 107  98 - 109 mEq/L   CO2 28  22 - 29 mEq/L   Glucose 92  70 - 140 mg/dl   BUN 16.1  7.0 - 09.6 mg/dL   Creatinine 1.2 (*) 0.6 - 1.1 mg/dL   Total Bilirubin 0.45  0.20 - 1.20 mg/dL   Alkaline Phosphatase 78  40 - 150 U/L   AST 15  5 - 34 U/L   ALT 8  0 - 55 U/L   Total Protein 6.5  6.4 - 8.3 g/dL   Albumin 3.8  3.5 - 5.0 g/dL   Calcium 9.6  8.4 - 40.9 mg/dL   Anion Gap 10  3 - 11 mEq/L  LACTATE DEHYDROGENASE (CC13)     Status: None   Collection Time    03/20/13  1:15 PM      Result Value Range   LDH 208  125 - 245 U/L    Labs:  Lab Results  Component Value Date   WBC 8.2 03/20/2013   HGB 12.3 03/20/2013   HCT 36.9 03/20/2013   MCV 92.8 03/20/2013   PLT 235 03/20/2013   NEUTROABS 5.9 03/20/2013      Chemistry      Component Value Date/Time   NA 144 03/20/2013 1314   NA 139 11/09/2011 1159   K 3.8  03/20/2013 1314   K 4.5 11/09/2011 1159   CL 106 09/26/2012 1418   CL 101 11/09/2011 1159   CO2 28 03/20/2013 1314   CO2 26 11/09/2011 1159   BUN 19.8 03/20/2013 1314   BUN 18 11/09/2011 1159   CREATININE 1.2* 03/20/2013 1314   CREATININE 1.39* 11/09/2011 1159      Component Value Date/Time   CALCIUM 9.6 03/20/2013 1314   CALCIUM 9.5 11/09/2011 1159   ALKPHOS 78 03/20/2013 1314   ALKPHOS 84 11/09/2011 1159   AST 15 03/20/2013 1314   AST 12 11/09/2011 1159   ALT 8 03/20/2013 1314   ALT <8 11/09/2011 1159   BILITOT 0.34 03/20/2013 1314   BILITOT 0.5 11/09/2011 1159     Basic Metabolic Panel:  Recent Labs Lab 03/20/13 1314  NA 144  K 3.8  CO2 28  GLUCOSE 92  BUN 19.8  CREATININE 1.2*  CALCIUM 9.6   GFR Estimated Creatinine Clearance: 39.3 ml/min (by C-G formula based on Cr of 1.2). Liver Function Tests:  Recent Labs Lab 03/20/13 1314  AST 15  ALT 8  ALKPHOS 78  BILITOT 0.34  PROT 6.5  ALBUMIN 3.8   CBC:  Recent Labs Lab 03/20/13 1314  WBC 8.2  NEUTROABS 5.9  HGB 12.3  HCT 36.9  MCV 92.8  PLT 235   Studies:  No results found.   RADIOGRAPHIC STUDIES: DIGITAL DIAGNOSTIC BILATERAL MAMMOGRAM  Comparison: Previous mammograms.  Findings:  ACR Breast Density Category b: There are scattered areas of  fibroglandular density.  Postsurgical changes are present from interval left breast  lumpectomy and left axillary lymph node dissection. Spot  compression magnification views were performed of the lumpectomy  site. There is no mammographic evidence of recurrent malignancy.  There are no suspicious masses or calcifications seen in either  breast.  Mammographic  images were processed with CAD.  IMPRESSION:  1. Post lumpectomy changes in the left breast.  2. No mammographic evidence of malignancy in either breast.   ASSESSMENT: Melissa Morton 79 y.o. female with a history of Breast cancer, left breast - Plan: CBC with Differential, Comprehensive metabolic  panel  Osteoporosis - Plan: DG Bone Density  Hx of radiation therapy  PLAN:  1. Breast cancer.  --T1c invasive ductal carcinoma of the left breast, hormone  receptor positive, HER-2/neu negative with negative sentinel lymph nodes status post lumpectomy and radiation treatments. --Continue Arimidex 1 mg by mouth daily.  This was started on 05/06/2012.  --Mammogram this past September, as noted above without evidence of malignancy.  Repeat mammogram in one year.   2. R/o Osteoporosis. --Continue alendronate 70 mg every seven days.  We will order dexa scan to evaluate her bone density. Her last bone density scan dated back 02/23/2012 revealed a T-score of the left femoral neck to be -0.9 and of the radius was -2.3.   3. Follow-up. We will obtain CBC, Chemistries in six months doing her next follow up appointment. All questions were answered. The patient knows to call the clinic with any problems, questions or concerns. We can certainly see the patient much sooner if necessary.  I spent 10 minutes counseling the patient face to face. The total time spent in the appointment was 15 minutes.    Jalyn Dutta, MD 03/22/2013 4:54 AM

## 2013-04-02 ENCOUNTER — Other Ambulatory Visit: Payer: Self-pay | Admitting: Nurse Practitioner

## 2013-04-09 ENCOUNTER — Other Ambulatory Visit: Payer: Self-pay | Admitting: Internal Medicine

## 2013-04-09 DIAGNOSIS — C50912 Malignant neoplasm of unspecified site of left female breast: Secondary | ICD-10-CM

## 2013-04-09 MED ORDER — ANASTROZOLE 1 MG PO TABS: 1.0000 mg | ORAL_TABLET | Freq: Every day | ORAL | Status: DC

## 2013-04-13 ENCOUNTER — Other Ambulatory Visit: Payer: Self-pay | Admitting: *Deleted

## 2013-04-13 NOTE — Telephone Encounter (Signed)
Error. Not a pt of Dr Myna Hidalgo.

## 2013-04-20 ENCOUNTER — Other Ambulatory Visit: Payer: Self-pay | Admitting: *Deleted

## 2013-04-20 NOTE — Telephone Encounter (Signed)
Erroneous encounter.  Dr. Myna Hidalgo did not prescribe this medication.  Looks as if this was taken care of

## 2013-09-18 ENCOUNTER — Ambulatory Visit (HOSPITAL_BASED_OUTPATIENT_CLINIC_OR_DEPARTMENT_OTHER): Payer: Medicare Other | Admitting: Internal Medicine

## 2013-09-18 ENCOUNTER — Telehealth: Payer: Self-pay | Admitting: Internal Medicine

## 2013-09-18 ENCOUNTER — Other Ambulatory Visit (HOSPITAL_BASED_OUTPATIENT_CLINIC_OR_DEPARTMENT_OTHER): Payer: Medicare Other

## 2013-09-18 VITALS — BP 176/77 | HR 75 | Temp 97.4°F | Resp 18 | Ht 64.0 in | Wt 169.3 lb

## 2013-09-18 DIAGNOSIS — N289 Disorder of kidney and ureter, unspecified: Secondary | ICD-10-CM

## 2013-09-18 DIAGNOSIS — M810 Age-related osteoporosis without current pathological fracture: Secondary | ICD-10-CM

## 2013-09-18 DIAGNOSIS — C50519 Malignant neoplasm of lower-outer quadrant of unspecified female breast: Secondary | ICD-10-CM

## 2013-09-18 DIAGNOSIS — Z923 Personal history of irradiation: Secondary | ICD-10-CM

## 2013-09-18 DIAGNOSIS — C50912 Malignant neoplasm of unspecified site of left female breast: Secondary | ICD-10-CM

## 2013-09-18 LAB — CBC WITH DIFFERENTIAL/PLATELET
BASO%: 1 % (ref 0.0–2.0)
BASOS ABS: 0.1 10*3/uL (ref 0.0–0.1)
EOS ABS: 0.1 10*3/uL (ref 0.0–0.5)
EOS%: 1.9 % (ref 0.0–7.0)
HCT: 40.1 % (ref 34.8–46.6)
HEMOGLOBIN: 13.1 g/dL (ref 11.6–15.9)
LYMPH%: 23.4 % (ref 14.0–49.7)
MCH: 30.2 pg (ref 25.1–34.0)
MCHC: 32.6 g/dL (ref 31.5–36.0)
MCV: 92.9 fL (ref 79.5–101.0)
MONO#: 0.5 10*3/uL (ref 0.1–0.9)
MONO%: 6.8 % (ref 0.0–14.0)
NEUT%: 66.9 % (ref 38.4–76.8)
NEUTROS ABS: 4.9 10*3/uL (ref 1.5–6.5)
Platelets: 239 10*3/uL (ref 145–400)
RBC: 4.32 10*6/uL (ref 3.70–5.45)
RDW: 14.7 % — AB (ref 11.2–14.5)
WBC: 7.3 10*3/uL (ref 3.9–10.3)
lymph#: 1.7 10*3/uL (ref 0.9–3.3)

## 2013-09-18 LAB — COMPREHENSIVE METABOLIC PANEL (CC13)
ALBUMIN: 3.8 g/dL (ref 3.5–5.0)
ALK PHOS: 76 U/L (ref 40–150)
ALT: 12 U/L (ref 0–55)
ANION GAP: 15 meq/L — AB (ref 3–11)
AST: 14 U/L (ref 5–34)
BUN: 21.9 mg/dL (ref 7.0–26.0)
CO2: 25 mEq/L (ref 22–29)
Calcium: 10.8 mg/dL — ABNORMAL HIGH (ref 8.4–10.4)
Chloride: 106 mEq/L (ref 98–109)
Creatinine: 1.4 mg/dL — ABNORMAL HIGH (ref 0.6–1.1)
GLUCOSE: 129 mg/dL (ref 70–140)
POTASSIUM: 3.6 meq/L (ref 3.5–5.1)
SODIUM: 146 meq/L — AB (ref 136–145)
Total Bilirubin: 0.52 mg/dL (ref 0.20–1.20)
Total Protein: 6.5 g/dL (ref 6.4–8.3)

## 2013-09-18 MED ORDER — ANASTROZOLE 1 MG PO TABS: 1.0000 mg | ORAL_TABLET | Freq: Every day | ORAL | Status: DC

## 2013-09-18 NOTE — Progress Notes (Signed)
[place], [place] [address]  DIAGNOSIS: Breast cancer, left breast  Hx of radiation therapy  Osteoporosis  Renal insufficiency  Chief Complaint  Patient presents with  . Follow-up    CURRENT THERAPY: Arimidex 1 mg by mouth daily for an anticipated 5 years. Arimidex was started on 05/06/2012.  INTERVAL HISTORY: Melissa Morton 80 y.o. female with a history of left breast T1c invasive ductal carcinoma, hormone receptor positive, HER-2/neu negative with negative sentinel lymph nodes status post lumpectomy and radiation is here for follow up.  She was last seen by me on 03/20/2013.  The patient was started on Arimidex 1 mg daily on 05/06/2012. She is tolerating Arimidex without any musculoskeletal problems. The patient feels generally well, is without any complaints today.  She lives with her daughter.  She remains very active with her grandchildren and great-grandchildren.  She has knee arthritis bilaterally but otherwise denies bone pain or fever or chills. She is considering a left knee replacement.  She denies any recent hospitalizations or emergency room visits. She had an interim mammogram which was reported as normal.     MEDICAL HISTORY: Past Medical History  Diagnosis Date  . Reflux   . Diverticulitis   . Hypertension   . Hyperlipemia   . Shortness of breath   . GERD (gastroesophageal reflux disease)   . PONV (postoperative nausea and vomiting)     has used scop patch-that helped  . Wears glasses   . Full dentures   . Breast cancer, left breast 12/07/2011  . Breast cancer, left breast 11/24/11    left breast 4 o'clock bx=invasive ductal ca,grade II,ER/PR=+  . Renal insufficiency 11/09/2011  . Arthritis     knee and hands  . Allergy     seasonal allergies  . HOH (hard of hearing)     right ear   . Hx of radiation therapy 03/14/12- 05/02/12    left breast 5040 gray in 28 fx, site of  presentation lower outer quadrant boosted to 6440 gray    INTERIM HISTORY: has Leucocytosis; Renal insufficiency; Gout; Breast cancer, left breast; Hx of radiation therapy; and Osteoporosis on her problem list.    ALLERGIES:  is allergic to hydromorphone hcl; nsaids; and prednisone.  MEDICATIONS: has a current medication list which includes the following prescription(s): albuterol, alendronate, allopurinol, anastrozole, aspirin ec, calcium-vitamin d, colchicine, hydrocodone-acetaminophen, losartan, potassium chloride sa, rabeprazole, simvastatin, solifenacin, and tramadol.  SURGICAL HISTORY:  Past Surgical History  Procedure Laterality Date  . Knee surgery  2000    rt   . Cholecystectomy    . Tubal ligation    . Bladder suspension    . Tonsillectomy    . Fracture surgery      lt foot  . Eye surgery      both cataracts  . Breast lumpectomy  01/04/2012    left,invasive ductcl ca, grade II,lloq, lymphovascular and perineural invaion ,DCIS,  . Abdominal hysterectomy  93    total   PROBLEM LIST:  1. Stage I, T1c N0 M0, 1.3 cm. invasive ductal carcinoma in situ, grade 2. The tumor was ER 100%, PR 100% positive, HER- 2/neu negative. HER-2/neu was negative by FISH. Ki67 proliferation marker was 14%. There was evidence of lymphovascular and perineural invasion. The tumor was 2 mm from the nearest margin inferiorly. None of the 3 lymph nodes sampled had evidence of tumor. She underwent a lumpectomy with sentinel  node dissection on 01/04/2012. Radiation treatments to the left breast were administered by Dr. Gery Pray from 03/14/2012 to 05/02/2012. The patient received 5040 cGy in 28 fractions plus a boost, for a total of 6440 cGy in 35 fractions. Genetic testing was carried out with the BreastNext gene panel through [employer]. No cancer causing mutations were detected in any of the 18 genes tested. She began taking Arimidex 1 mg PO daily on 05/06/2012.  2. Overactive bladder  3.  Gout  4. Hyperlipidemia  5. 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 HTN  7. Osteopenia  8. Arthritis  9. Impaired renal functioning  10. GERD  11. Sigmoid diverticulosis requiring admission in August 2012.  REVIEW OF SYSTEMS:   Constitutional: Denies fevers, chills or abnormal weight loss Eyes: Denies blurriness of vision Ears, nose, mouth, throat, and face: Denies mucositis or sore throat Respiratory: Denies cough, dyspnea or wheezes Cardiovascular: Denies palpitation, chest discomfort or lower extremity swelling Gastrointestinal:  Denies nausea, heartburn or change in bowel habits Skin: Denies abnormal skin rashes Lymphatics: Denies new lymphadenopathy or easy bruising Neurological:Denies numbness, tingling or new weaknesses Behavioral/Psych: Mood is stable, no new changes  All other systems were reviewed with the patient and are negative.  PHYSICAL EXAMINATION: ECOG PERFORMANCE STATUS: 0 - Asymptomatic  Blood pressure 176/77, pulse 75, temperature 97.4 F (36.3 C), temperature source Oral, resp. rate 18, height 5' 4"  (1.626 m), weight 169 lb 4.8 oz (76.794 kg), SpO2 99.00%.  GENERAL:alert, no distress and comfortable elderly female who is easily mobile.  SKIN: skin color, texture, turgor are normal, no rashes or significant lesions EYES: normal, Conjunctiva are pink and non-injected, sclera clear OROPHARYNX:no exudate, no erythema and lips, buccal mucosa, and tongue normal  NECK: supple, thyroid normal size, non-tender, without nodularity LYMPH:  no palpable lymphadenopathy in the cervical, axillary or supraclavicular LUNGS: clear to auscultation and percussion with normal breathing effort HEART: regular rate & rhythm and no murmurs and no lower extremity edema BREASTS: Right breast is normal; left breast slightly smaller than the right breast.  There were no suspicious findings or masses or dimpling or nipple inversion or induration.  ABDOMEN:abdomen soft, non-tender and normal bowel  sounds Musculoskeletal:no cyanosis of digits and no clubbing  NEURO: alert & oriented x 3 with fluent speech, no focal motor/sensory deficits  LABORATORY DATA: Results for orders placed in visit on 09/18/13 (from the past 48 hour(s))  CBC WITH DIFFERENTIAL     Status: Abnormal   Collection Time    09/18/13  9:35 AM      Result Value Ref Range   WBC 7.3  3.9 - 10.3 10e3/uL   NEUT# 4.9  1.5 - 6.5 10e3/uL   HGB 13.1  11.6 - 15.9 g/dL   HCT 40.1  34.8 - 46.6 %   Platelets 239  145 - 400 10e3/uL   MCV 92.9  79.5 - 101.0 fL   MCH 30.2  25.1 - 34.0 pg   MCHC 32.6  31.5 - 36.0 g/dL   RBC 4.32  3.70 - 5.45 10e6/uL   RDW 14.7 (*) 11.2 - 14.5 %   lymph# 1.7  0.9 - 3.3 10e3/uL   MONO# 0.5  0.1 - 0.9 10e3/uL   Eosinophils Absolute 0.1  0.0 - 0.5 10e3/uL   Basophils Absolute 0.1  0.0 - 0.1 10e3/uL   NEUT% 66.9  38.4 - 76.8 %   LYMPH% 23.4  14.0 - 49.7 %   MONO% 6.8  0.0 - 14.0 %  EOS% 1.9  0.0 - 7.0 %   BASO% 1.0  0.0 - 2.0 %  COMPREHENSIVE METABOLIC PANEL (SL37)     Status: Abnormal   Collection Time    09/18/13  9:36 AM      Result Value Ref Range   Sodium 146 (*) 136 - 145 mEq/L   Potassium 3.6  3.5 - 5.1 mEq/L   Chloride 106  98 - 109 mEq/L   CO2 25  22 - 29 mEq/L   Glucose 129  70 - 140 mg/dl   BUN 21.9  7.0 - 26.0 mg/dL   Creatinine 1.4 (*) 0.6 - 1.1 mg/dL   Total Bilirubin 0.52  0.20 - 1.20 mg/dL   Alkaline Phosphatase 76  40 - 150 U/L   AST 14  5 - 34 U/L   ALT 12  0 - 55 U/L   Total Protein 6.5  6.4 - 8.3 g/dL   Albumin 3.8  3.5 - 5.0 g/dL   Calcium 10.8 (*) 8.4 - 10.4 mg/dL   Anion Gap 15 (*) 3 - 11 mEq/L    Labs:  Lab Results  Component Value Date   WBC 7.3 09/18/2013   HGB 13.1 09/18/2013   HCT 40.1 09/18/2013   MCV 92.9 09/18/2013   PLT 239 09/18/2013   NEUTROABS 4.9 09/18/2013      Chemistry      Component Value Date/Time   NA 146* 09/18/2013 0936   NA 139 11/09/2011 1159   K 3.6 09/18/2013 0936   K 4.5 11/09/2011 1159   CL 106 09/26/2012 1418   CL 101 11/09/2011  1159   CO2 25 09/18/2013 0936   CO2 26 11/09/2011 1159   BUN 21.9 09/18/2013 0936   BUN 18 11/09/2011 1159   CREATININE 1.4* 09/18/2013 0936   CREATININE 1.39* 11/09/2011 1159      Component Value Date/Time   CALCIUM 10.8* 09/18/2013 0936   CALCIUM 9.5 11/09/2011 1159   ALKPHOS 76 09/18/2013 0936   ALKPHOS 84 11/09/2011 1159   AST 14 09/18/2013 0936   AST 12 11/09/2011 1159   ALT 12 09/18/2013 0936   ALT <8 11/09/2011 1159   BILITOT 0.52 09/18/2013 0936   BILITOT 0.5 11/09/2011 1159     Basic Metabolic Panel:  Recent Labs Lab 09/18/13 0936  NA 146*  K 3.6  CO2 25  GLUCOSE 129  BUN 21.9  CREATININE 1.4*  CALCIUM 10.8*   GFR Estimated Creatinine Clearance: 33.7 ml/min (by C-G formula based on Cr of 1.4). Liver Function Tests:  Recent Labs Lab 09/18/13 0936  AST 14  ALT 12  ALKPHOS 76  BILITOT 0.52  PROT 6.5  ALBUMIN 3.8   CBC:  Recent Labs Lab 09/18/13 0935  WBC 7.3  NEUTROABS 4.9  HGB 13.1  HCT 40.1  MCV 92.9  PLT 239   Studies:  No results Morton.   RADIOGRAPHIC STUDIES: 01/09/2013 DIGITAL DIAGNOSTIC BILATERAL MAMMOGRAM  Comparison: Previous mammograms. Findings: ACR Breast Density Category b: There are scattered areas of fibroglandular density. Postsurgical changes are present from interval left breast lumpectomy and left axillary lymph node dissection. Spot  compression magnification views were performed of the lumpectomy site. There is no mammographic evidence of recurrent malignancy.There are no suspicious masses or calcifications seen in either breast. Mammographic images were processed with CAD. IMPRESSION: 1. Post lumpectomy changes in the left breast. 2. No mammographic evidence of malignancy in either breast.   ASSESSMENT: Melissa Morton 80 y.o. female with a history of  Breast cancer, left breast  Hx of radiation therapy  Osteoporosis  Renal insufficiency  PLAN:  1. Breast cancer.  --T1c invasive ductal carcinoma of the left breast, hormone  receptor  positive, HER-2/neu negative with negative sentinel lymph nodes status post lumpectomy and radiation treatments. --Continue Arimidex 1 mg by mouth daily.  This was started on 05/06/2012.  --Mammogram this past September, as noted above without evidence of malignancy.  Repeat mammogram in one year.   2. R/o Osteoporosis. --Continue alendronate 70 mg every seven days.  We will order dexa scan to evaluate her bone density. Her last bone density scan dated back 02/23/2012 revealed a T-score of the left femoral neck to be -0.9 and of the radius was -2.3.   3. Hypercalcemia. --She is on calcium bid.  We counseled her to decrease her oral calcium intake.  She was provided a handout on elevated calcium.   4. Follow-up. We will obtain CBC, Chemistries in six months doing her next follow up appointment. All questions were answered. The patient knows to call the clinic with any problems, questions or concerns. We can certainly see the patient much sooner if necessary.  I spent 10 minutes counseling the patient face to face. The total time spent in the appointment was 15 minutes.    Concha Norway, MD 09/18/2013 10:54 AM

## 2013-09-18 NOTE — Patient Instructions (Signed)
Hypercalcemia Hypercalcemia means the calcium in your blood is too high. Calcium in our blood is important for the control of many things, such as:  Blood clotting.  Conducting of nerve impulses.  Muscle contraction.  Maintaining teeth and bone health.  Other body functions. In the bloodstream, calcium maintains a constant balance with another mineral, phosphate. Calcium is absorbed into the body through the small intestine. This is helped by Vitamin D. Calcium levels are maintained mostly by vitamin D and a hormone (parathyroid hormone). But the kidneys also help. Hypercalcemia can happen when the concentration of calcium is too high for the kidneys to maintain balance. The body maintains a balance between the calcium we eat and the calcium already in our body. If calcium intake is increased or we cannot use calcium properly, there may be problems. Some common sources of calcium are:   Dairy products.  Nuts.  Eggs.  Whole grains.  Legumes.  Green leafy vegetables. CAUSES There are many causes of this condition, but some common ones are:  Hyperparathyroidism. This is an over activity of the parathyroid gland.  Cancers of the breast, kidney, lung, head and neck are common causes of calcium increases.  Medications that cause you to urinate more often (diuretics), nausea, vomiting and diarrhea also increase the calcium in the blood.  Overuse of calcium-containing antacids. SYMPTOMS  Many patients with mild hypercalcemia have no symptoms. For those with symptoms common problems include:  Loss of appetite.  Constipation.  Increased thirst.  Heart rhythm changes.  Abnormal thinking.  Nausea.  Abdominal pain.  Kidney stones.  Mood swings.  Coma and death when severe.  Vomiting.  Increased urination.  High blood pressure.  Confusion. DIAGNOSIS   Your caregiver will do a medical history and perform a physical exam on you.  Calcium and parathyroid hormone  (PTH) may be measured with a blood test. TREATMENT   The treatment depends on the calcium level and what is causing the higher level. Hypercalcemia can be lifethreatening. Fast lowering of the calcium level may be necessary.  With normal kidney function, fluids can be given by vein to clear the excess calcium. Hemodialysis works well to reduce dangerous calcium levels if there is poor kidney function. This is a procedure in which a machine is used to filter out unwanted substances. The blood is then returned to the body.  Drugs, such as diuretics, can be given after adequate fluid intake is established. These medications help the kidneys get rid of extra calcium. Drugs that lessen (inhibit) bone loss are helpful in gaining long-term control. Phosphate pills help lower high calcium levels caused by a low supply of phosphate. Anti-inflammatory agents such as steroids are helpful with some cancers and toxic levels of vitamin D.  Treatment of the underlying cause of the hypercalcemia will also correct the imbalance. Hyperparathyroidism is usually treated by surgical removal of one or more of the parathyroid glands and any tissue, other than the glands themselves, that is producing too much hormone.  The hypercalcemia caused by cancer is difficult to treat without controlling the cancer. Symptoms can be improved with fluids and drug therapy as outlined above. PROGNOSIS   Surgery to remove the parathyroid glands is usually successful. This also depends on the amount of damage to the kidneys and whether or not it can be treated.  Mild hypercalcemia can be controlled with good fluid intake and the use of effective medications.  Hypercalcemia often develops as a late complication of cancer. The expected outlook   is poor without effective anticancer therapy. PREVENTION   If you are at risk for developing hypercalcemia, be familiar with early symptoms. Report these to your caregiver.  Good fluid intake  (up to four quarts of liquid a day if possible) is helpful.  Try to control nausea and vomiting, and treat fevers to avoid dehydration.  Lowering the amount of calcium in your diet is not necessary. High blood calcium reduces absorption of calcium in the intestine.  Stay as active as possible. SEEK IMMEDIATE MEDICAL CARE IF:   You develop chest pain, sweating, or shortness of breath.  You get confused, feel faint or pass out.  You develop severe nausea and vomiting. MAKE SURE YOU:   Understand these instructions.  Will watch your condition.  Will get help right away if you are not doing well or get worse. Document Released: 07/03/2004 Document Revised: 08/14/2012 Document Reviewed: 04/14/2010 [employer] Patient Information 2014 [employer].  

## 2013-09-18 NOTE — Telephone Encounter (Signed)
gave pt appt for lab and MD on november 2015

## 2013-11-20 ENCOUNTER — Telehealth: Payer: Self-pay

## 2013-11-20 NOTE — Telephone Encounter (Signed)
S/w pt. She says dr Noemi Chapel needs clearance from [facility] for her knee replacement surgery. [place] and left voice message with surgery scheduler with our fax and phone for the paperwork.

## 2013-12-12 ENCOUNTER — Encounter (HOSPITAL_COMMUNITY): Payer: Self-pay | Admitting: Physician Assistant

## 2013-12-12 DIAGNOSIS — M1712 Unilateral primary osteoarthritis, left knee: Secondary | ICD-10-CM | POA: Diagnosis present

## 2013-12-12 DIAGNOSIS — I10 Essential (primary) hypertension: Secondary | ICD-10-CM | POA: Diagnosis present

## 2013-12-12 DIAGNOSIS — Z9889 Other specified postprocedural states: Secondary | ICD-10-CM

## 2013-12-12 DIAGNOSIS — R112 Nausea with vomiting, unspecified: Secondary | ICD-10-CM | POA: Diagnosis present

## 2013-12-12 DIAGNOSIS — K219 Gastro-esophageal reflux disease without esophagitis: Secondary | ICD-10-CM | POA: Diagnosis present

## 2013-12-12 NOTE — H&P (Signed)
TOTAL KNEE ADMISSION H&P  Patient is being admitted for left total knee arthroplasty.  Subjective:  Chief Complaint:left knee pain.  HPI: Melissa Morton, 80 y.o. female, has a history of pain and functional disability in the left knee due to arthritis and has failed non-surgical conservative treatments for greater than 12 weeks to includeNSAID's and/or analgesics, corticosteriod injections, viscosupplementation injections, flexibility and strengthening excercises, supervised PT with diminished ADL's post treatment, use of assistive devices and activity modification.  Onset of symptoms was gradual, starting 9 years ago with gradually worsening course since that time. The patient noted no past surgery on the left knee(s).  Patient currently rates pain in the left knee(s) at 10 out of 10 with activity. Patient has night pain, worsening of pain with activity and weight bearing, pain that interferes with activities of daily living, crepitus and joint swelling.  Patient has evidence of subchondral sclerosis, periarticular osteophytes and joint space narrowing by imaging studies.  There is no active infection.  Patient Active Problem List   Diagnosis Date Noted  . PONV (postoperative nausea and vomiting)   . GERD (gastroesophageal reflux disease)   . Hypertension   . Left knee DJD   . Osteoporosis 03/20/2013  . Hx of radiation therapy   . Breast cancer, left breast 12/07/2011  . Leucocytosis 11/09/2011  . Renal insufficiency 11/09/2011  . Gout 11/09/2011   Past Medical History  Diagnosis Date  . Reflux   . Diverticulitis   . Hypertension   . Hyperlipemia   . Shortness of breath   . GERD (gastroesophageal reflux disease)   . PONV (postoperative nausea and vomiting)     has used scop patch-that helped  . Wears glasses   . Full dentures   . Breast cancer, left breast 12/07/2011  . Breast cancer, left breast 11/24/11    left breast 4 o'clock bx=invasive ductal ca,grade II,ER/PR=+  . Renal  insufficiency 11/09/2011  . Arthritis     knee and hands  . Allergy     seasonal allergies  . HOH (hard of hearing)     right ear   . Hx of radiation therapy 03/14/12- 05/02/12    left breast 5040 gray in 28 fx, site of presentation lower outer quadrant boosted to 6440 gray  . Left knee DJD     Past Surgical History  Procedure Laterality Date  . Knee surgery  2000    rt   . Cholecystectomy    . Tubal ligation    . Bladder suspension    . Tonsillectomy    . Fracture surgery      lt foot  . Eye surgery      both cataracts  . Breast lumpectomy  01/04/2012    left,invasive ductcl ca, grade II,lloq, lymphovascular and perineural invaion ,DCIS,  . Abdominal hysterectomy  89    total    No prescriptions prior to admission   Allergies  Allergen Reactions  . Hydromorphone Hcl Nausea And Vomiting  . Nsaids     11/09/11-Avoid due to decreased liver function.  . Prednisone Nausea And Vomiting     No current facility-administered medications for this encounter. Current outpatient prescriptions:albuterol (PROVENTIL) (2.5 MG/3ML) 0.083% nebulizer solution, Take 2.5 mg by nebulization as needed., Disp: , Rfl: ;  alendronate (FOSAMAX) 70 MG tablet, Take 70 mg by mouth every 7 (seven) days. On wednesday.   Take with a full glass of water on an empty stomach., Disp: , Rfl: ;  allopurinol (ZYLOPRIM) 300 MG  tablet, Take 300 mg by mouth daily., Disp: , Rfl:  anastrozole (ARIMIDEX) 1 MG tablet, Take 1 tablet (1 mg total) by mouth daily., Disp: 90 tablet, Rfl: 3;  aspirin EC 81 MG tablet, Take 81 mg by mouth daily., Disp: , Rfl: ;  colchicine 0.6 MG tablet, Take 0.6 mg by mouth as needed. gout, Disp: , Rfl: ;  losartan (COZAAR) 100 MG tablet, Take 100 mg by mouth daily., Disp: , Rfl: ;  potassium chloride SA (K-DUR,KLOR-CON) 20 MEQ tablet, Take 20 mEq by mouth daily., Disp: , Rfl:  RABEprazole (ACIPHEX) 20 MG tablet, Take 20 mg by mouth daily. , Disp: , Rfl: ;  simvastatin (ZOCOR) 40 MG tablet, Take 40 mg  by mouth every evening., Disp: , Rfl: ;  traMADol (ULTRAM) 50 MG tablet, Take 50 mg by mouth as needed., Disp: , Rfl: ;  calcium-vitamin D (OSCAL WITH D) 500-200 MG-UNIT per tablet, Take 1 tablet by mouth 2 (two) times daily., Disp: , Rfl:  HYDROcodone-acetaminophen (NORCO/VICODIN) 5-325 MG per tablet, Take 1 tablet by mouth every 6 (six) hours as needed., Disp: , Rfl: ;  solifenacin (VESICARE) 10 MG tablet, Take 10 mg by mouth daily., Disp: , Rfl:    History  Substance Use Topics  . Smoking status: Former Smoker -- 0.25 packs/day for 20 years    Types: Cigarettes    Quit date: 11/09/1991  . Smokeless tobacco: Never Used  . Alcohol Use: Yes     Comment: socially    Family History  Problem Relation Age of Onset  . Breast cancer Sister 58  . Leukemia Sister   . Heart disease Father   . Breast cancer Maternal Aunt 36  . Lung cancer Cousin     smoker     Review of Systems  Constitutional: Negative.   HENT: Negative.   Eyes: Negative.   Respiratory: Negative.   Cardiovascular: Negative.   Gastrointestinal: Positive for constipation.  Genitourinary: Negative.   Musculoskeletal: Positive for joint pain.  Skin: Negative.   Neurological: Negative.   Endo/Heme/Allergies: Negative.   Psychiatric/Behavioral: Negative.     Objective:  Physical Exam  Constitutional: She is oriented to person, place, and time. She appears well-developed and well-nourished.  HENT:  Head: Normocephalic and atraumatic.  Mouth/Throat: Oropharynx is clear and moist.  Eyes: Conjunctivae and EOM are normal. Pupils are equal, round, and reactive to light.  Neck: Normal range of motion. Neck supple.  Cardiovascular: Normal rate and regular rhythm.   Respiratory: Effort normal and breath sounds normal.  GI: Soft. Bowel sounds are normal.  Genitourinary:  Not pertinent to current symptomatology therefore not examined.  Musculoskeletal:  She is independently ambulatory with a moderately antalgic gait.  She  has active range of motion 0-120 degrees.  Medial joint line tenderness.  2+ crepitus.  2+ synovitis.  Distal neurovascular exam is intact. Right knee has full range of motion.  1+ crepitus.  1+ synovitis.  Minimal pain today.   Neurological: She is alert and oriented to person, place, and time.  Skin: Skin is warm and dry.  Psychiatric: She has a normal mood and affect. Her behavior is normal.    Vital signs in last 24 hours: Temp:  [97.6 F (36.4 C)] 97.6 F (36.4 C) (08/12 1300) Pulse Rate:  [75] 75 (08/12 1300) BP: (164)/(69) 164/69 mmHg (08/12 1300) SpO2:  [98 %] 98 % (08/12 1300) Weight:  [77.565 kg (171 lb)] 77.565 kg (171 lb) (08/12 1300)  Labs:   Estimated body  mass index is 29.34 kg/(m^2) as calculated from the following:   Height as of this encounter: 5\' 4"  (1.626 m).   Weight as of this encounter: 77.565 kg (171 lb).   Imaging Review Plain radiographs demonstrate severe degenerative joint disease of the left knee(s). The overall alignment ismild valgus. The bone quality appears to be good for age and reported activity level.  Assessment/Plan:  End stage arthritis, left knee   The patient history, physical examination, clinical judgment of the provider and imaging studies are consistent with end stage degenerative joint disease of the left knee(s) and total knee arthroplasty is deemed medically necessary. The treatment options including medical management, injection therapy arthroscopy and arthroplasty were discussed at length. The risks and benefits of total knee arthroplasty were presented and reviewed. The risks due to aseptic loosening, infection, stiffness, patella tracking problems, thromboembolic complications and other imponderables were discussed. The patient acknowledged the explanation, agreed to proceed with the plan and consent was signed. Patient is being admitted for inpatient treatment for surgery, pain control, PT, OT, prophylactic antibiotics, VTE  prophylaxis, progressive ambulation and ADL's and discharge planning. The patient is planning to be discharged home with home health services with her Daughter, Almira Coaster.  Klebsiella UTI   Start on Ceftin on 8/18.  Will need a cath UA preop and antibiotics in her cement.

## 2013-12-13 ENCOUNTER — Encounter (HOSPITAL_COMMUNITY): Payer: Self-pay | Admitting: Pharmacy Technician

## 2013-12-13 NOTE — Pre-Procedure Instructions (Signed)
DACIA CAPERS  12/13/2013   Your procedure is scheduled on:  Mon, Aug 24 @ 9:00 AM  Report to [facility] at 7:00 AM.  Call this number if you have problems the morning of surgery: [phone]   Remember:   Do not eat food or drink liquids after midnight.   Take these medicines the morning of surgery with A SIP OF WATER: Albuterol<Bring Your Inhaler With You>,Allopurinol(Zyloprim),Colchicine(if needed),Aciphex(Rabeprazole),Vesicare(Solifenacin),and Tramadol(Ultram-if needed)               Stop taking your Aspirin. No Goody's,BC's,Aleve,Ibuprofen,Fish Oil,or any Herbal Medications   Do not wear jewelry, make-up or nail polish.  Do not wear lotions, powders, or perfumes. You may wear deodorant.  Do not shave 48 hours prior to surgery.   Do not bring valuables to the hospital.  [facility] is not responsible                  for any belongings or valuables.               Contacts, dentures or bridgework may not be worn into surgery.  Leave suitcase in the car. After surgery it may be brought to your room.  For patients admitted to the hospital, discharge time is determined by your                treatment team.                Special Instructions:  [place] - Preparing for Surgery  Before surgery, you can play an important role.  Because skin is not sterile, your skin needs to be as free of germs as possible.  You can reduce the number of germs on you skin by washing with CHG (chlorahexidine gluconate) soap before surgery.  CHG is an antiseptic cleaner which kills germs and bonds with the skin to continue killing germs even after washing.  Please DO NOT use if you have an allergy to CHG or antibacterial soaps.  If your skin becomes reddened/irritated stop using the CHG and inform your nurse when you arrive at Short Stay.  Do not shave (including legs and underarms) for at least 48 hours prior to the first CHG shower.  You may shave your face.  Please follow these  instructions carefully:   1.  Shower with CHG Soap the night before surgery and the                                morning of Surgery.  2.  If you choose to wash your hair, wash your hair first as usual with your       normal shampoo.  3.  After you shampoo, rinse your hair and body thoroughly to remove the                      Shampoo.  4.  Use CHG as you would any other liquid soap.  You can apply chg directly       to the skin and wash gently with scrungie or a clean washcloth.  5.  Apply the CHG Soap to your body ONLY FROM THE NECK DOWN.        Do not use on open wounds or open sores.  Avoid contact with your eyes,       ears, mouth and genitals (private parts).  Wash genitals (private parts)  with your normal soap.  6.  Wash thoroughly, paying special attention to the area where your surgery        will be performed.  7.  Thoroughly rinse your body with warm water from the neck down.  8.  DO NOT shower/wash with your normal soap after using and rinsing off       the CHG Soap.  9.  Pat yourself dry with a clean towel.            10.  Wear clean pajamas.            11.  Place clean sheets on your bed the night of your first shower and do not        sleep with pets.  Day of Surgery  Do not apply any lotions/deoderants the morning of surgery.  Please wear clean clothes to the hospital/surgery center.     Please read over the following fact sheets that you were given: Pain Booklet, Coughing and Deep Breathing, Blood Transfusion Information, MRSA Information and Surgical Site Infection Prevention

## 2013-12-14 ENCOUNTER — Ambulatory Visit (HOSPITAL_COMMUNITY)
Admission: RE | Admit: 2013-12-14 | Discharge: 2013-12-14 | Disposition: A | Discharge status: Home / Self Care | Payer: Medicare Other | Source: Ambulatory Visit | Attending: Physician Assistant | Admitting: Physician Assistant

## 2013-12-14 ENCOUNTER — Encounter (HOSPITAL_COMMUNITY)
Admission: RE | Admit: 2013-12-14 | Discharge: 2013-12-14 | Disposition: A | Discharge status: Home / Self Care | Payer: Medicare Other | Source: Ambulatory Visit | Attending: Orthopedic Surgery | Admitting: Orthopedic Surgery

## 2013-12-14 ENCOUNTER — Encounter (HOSPITAL_COMMUNITY): Payer: Self-pay

## 2013-12-14 DIAGNOSIS — Z01818 Encounter for other preprocedural examination: Secondary | ICD-10-CM | POA: Diagnosis not present

## 2013-12-14 LAB — CBC WITH DIFFERENTIAL/PLATELET
BASOS ABS: 0 10*3/uL (ref 0.0–0.1)
BASOS PCT: 1 % (ref 0–1)
Eosinophils Absolute: 0.2 10*3/uL (ref 0.0–0.7)
Eosinophils Relative: 3 % (ref 0–5)
HCT: 37.8 % (ref 36.0–46.0)
HEMOGLOBIN: 12.3 g/dL (ref 12.0–15.0)
Lymphocytes Relative: 23 % (ref 12–46)
Lymphs Abs: 2 10*3/uL (ref 0.7–4.0)
MCH: 29.9 pg (ref 26.0–34.0)
MCHC: 32.5 g/dL (ref 30.0–36.0)
MCV: 92 fL (ref 78.0–100.0)
MONOS PCT: 8 % (ref 3–12)
Monocytes Absolute: 0.7 10*3/uL (ref 0.1–1.0)
NEUTROS ABS: 5.8 10*3/uL (ref 1.7–7.7)
Neutrophils Relative %: 65 % (ref 43–77)
Platelets: 249 10*3/uL (ref 150–400)
RBC: 4.11 MIL/uL (ref 3.87–5.11)
RDW: 13.6 % (ref 11.5–15.5)
WBC: 8.8 10*3/uL (ref 4.0–10.5)

## 2013-12-14 LAB — COMPREHENSIVE METABOLIC PANEL
ALBUMIN: 3.7 g/dL (ref 3.5–5.2)
ALK PHOS: 92 U/L (ref 39–117)
ALT: 9 U/L (ref 0–35)
AST: 19 U/L (ref 0–37)
Anion gap: 15 (ref 5–15)
BUN: 18 mg/dL (ref 6–23)
CHLORIDE: 106 meq/L (ref 96–112)
CO2: 19 mEq/L (ref 19–32)
Calcium: 9.4 mg/dL (ref 8.4–10.5)
Creatinine, Ser: 1.25 mg/dL — ABNORMAL HIGH (ref 0.50–1.10)
GFR calc Af Amer: 46 mL/min — ABNORMAL LOW (ref >90)
GFR calc non Af Amer: 40 mL/min — ABNORMAL LOW (ref >90)
Glucose, Bld: 81 mg/dL (ref 70–99)
POTASSIUM: 4.2 meq/L (ref 3.7–5.3)
SODIUM: 140 meq/L (ref 137–147)
TOTAL PROTEIN: 6.5 g/dL (ref 6.0–8.3)
Total Bilirubin: 0.4 mg/dL (ref 0.3–1.2)

## 2013-12-14 LAB — URINE MICROSCOPIC-ADD ON

## 2013-12-14 LAB — URINALYSIS, ROUTINE W REFLEX MICROSCOPIC
BILIRUBIN URINE: NEGATIVE
Glucose, UA: NEGATIVE mg/dL
Ketones, ur: NEGATIVE mg/dL
NITRITE: POSITIVE — AB
PROTEIN: NEGATIVE mg/dL
SPECIFIC GRAVITY, URINE: 1.017 (ref 1.005–1.030)
UROBILINOGEN UA: 0.2 mg/dL (ref 0.0–1.0)
pH: 6 (ref 5.0–8.0)

## 2013-12-14 LAB — SURGICAL PCR SCREEN
MRSA, PCR: NEGATIVE
STAPHYLOCOCCUS AUREUS: NEGATIVE

## 2013-12-14 LAB — APTT: APTT: 27 s (ref 24–37)

## 2013-12-14 LAB — TYPE AND SCREEN
ABO/RH(D): AB POS
ANTIBODY SCREEN: NEGATIVE

## 2013-12-14 LAB — PROTIME-INR
INR: 1.08 (ref 0.00–1.49)
Prothrombin Time: 14 seconds (ref 11.6–15.2)

## 2013-12-14 LAB — ABO/RH: ABO/RH(D): AB POS

## 2013-12-16 LAB — URINE CULTURE

## 2013-12-23 MED ORDER — CEFAZOLIN SODIUM-DEXTROSE 2-3 GM-% IV SOLR
2.0000 g | INTRAVENOUS | Status: AC
  Administered 2013-12-24: 2 g via INTRAVENOUS
  Filled 2013-12-23: qty 50

## 2013-12-23 MED ORDER — LACTATED RINGERS IV SOLN: INTRAVENOUS | Status: DC

## 2013-12-23 MED ORDER — POVIDONE-IODINE 7.5 % EX SOLN
Freq: Once | CUTANEOUS | Status: DC
  Filled 2013-12-23: qty 118

## 2013-12-23 MED ORDER — CHLORHEXIDINE GLUCONATE 4 % EX LIQD
60.0000 mL | Freq: Once | CUTANEOUS | Status: DC
  Filled 2013-12-23: qty 60

## 2013-12-24 ENCOUNTER — Inpatient Hospital Stay (HOSPITAL_COMMUNITY): Payer: Medicare Other | Admitting: Anesthesiology

## 2013-12-24 ENCOUNTER — Inpatient Hospital Stay (HOSPITAL_COMMUNITY)
Admission: RE | Admit: 2013-12-24 | Discharge: 2013-12-26 | DRG: 470 | Disposition: A | Discharge status: Home Health | Payer: Medicare Other | Source: Ambulatory Visit | Attending: Orthopedic Surgery | Admitting: Orthopedic Surgery

## 2013-12-24 ENCOUNTER — Encounter (HOSPITAL_COMMUNITY)
Admission: RE | Disposition: A | Discharge status: Home Health | Payer: Medicare Other | Source: Ambulatory Visit | Attending: Orthopedic Surgery

## 2013-12-24 ENCOUNTER — Encounter (HOSPITAL_COMMUNITY): Payer: Self-pay | Admitting: Anesthesiology

## 2013-12-24 DIAGNOSIS — Z886 Allergy status to analgesic agent status: Secondary | ICD-10-CM

## 2013-12-24 DIAGNOSIS — M1710 Unilateral primary osteoarthritis, unspecified knee: Principal | ICD-10-CM | POA: Diagnosis present

## 2013-12-24 DIAGNOSIS — E785 Hyperlipidemia, unspecified: Secondary | ICD-10-CM | POA: Diagnosis present

## 2013-12-24 DIAGNOSIS — D72829 Elevated white blood cell count, unspecified: Secondary | ICD-10-CM | POA: Diagnosis present

## 2013-12-24 DIAGNOSIS — K219 Gastro-esophageal reflux disease without esophagitis: Secondary | ICD-10-CM | POA: Diagnosis present

## 2013-12-24 DIAGNOSIS — Z853 Personal history of malignant neoplasm of breast: Secondary | ICD-10-CM | POA: Diagnosis not present

## 2013-12-24 DIAGNOSIS — Z801 Family history of malignant neoplasm of trachea, bronchus and lung: Secondary | ICD-10-CM

## 2013-12-24 DIAGNOSIS — R112 Nausea with vomiting, unspecified: Secondary | ICD-10-CM | POA: Diagnosis present

## 2013-12-24 DIAGNOSIS — Z9889 Other specified postprocedural states: Secondary | ICD-10-CM

## 2013-12-24 DIAGNOSIS — Z87891 Personal history of nicotine dependence: Secondary | ICD-10-CM | POA: Diagnosis not present

## 2013-12-24 DIAGNOSIS — M109 Gout, unspecified: Secondary | ICD-10-CM | POA: Diagnosis present

## 2013-12-24 DIAGNOSIS — Z79899 Other long term (current) drug therapy: Secondary | ICD-10-CM | POA: Diagnosis not present

## 2013-12-24 DIAGNOSIS — B961 Klebsiella pneumoniae [K. pneumoniae] as the cause of diseases classified elsewhere: Secondary | ICD-10-CM | POA: Diagnosis present

## 2013-12-24 DIAGNOSIS — N289 Disorder of kidney and ureter, unspecified: Secondary | ICD-10-CM | POA: Diagnosis present

## 2013-12-24 DIAGNOSIS — Z888 Allergy status to other drugs, medicaments and biological substances status: Secondary | ICD-10-CM

## 2013-12-24 DIAGNOSIS — I10 Essential (primary) hypertension: Secondary | ICD-10-CM | POA: Diagnosis present

## 2013-12-24 DIAGNOSIS — M179 Osteoarthritis of knee, unspecified: Secondary | ICD-10-CM | POA: Diagnosis present

## 2013-12-24 DIAGNOSIS — M810 Age-related osteoporosis without current pathological fracture: Secondary | ICD-10-CM | POA: Diagnosis present

## 2013-12-24 DIAGNOSIS — Z923 Personal history of irradiation: Secondary | ICD-10-CM

## 2013-12-24 DIAGNOSIS — Z803 Family history of malignant neoplasm of breast: Secondary | ICD-10-CM

## 2013-12-24 DIAGNOSIS — N390 Urinary tract infection, site not specified: Secondary | ICD-10-CM | POA: Diagnosis present

## 2013-12-24 DIAGNOSIS — Z7982 Long term (current) use of aspirin: Secondary | ICD-10-CM

## 2013-12-24 DIAGNOSIS — C50512 Malignant neoplasm of lower-outer quadrant of left female breast: Secondary | ICD-10-CM | POA: Diagnosis present

## 2013-12-24 DIAGNOSIS — M1712 Unilateral primary osteoarthritis, left knee: Secondary | ICD-10-CM | POA: Diagnosis present

## 2013-12-24 HISTORY — DX: Unilateral primary osteoarthritis, left knee: M17.12

## 2013-12-24 HISTORY — PX: TOTAL KNEE ARTHROPLASTY: SHX125

## 2013-12-24 SURGERY — ARTHROPLASTY, KNEE, TOTAL
Anesthesia: Regional | Laterality: Left

## 2013-12-24 MED ORDER — 0.9 % SODIUM CHLORIDE (POUR BTL) OPTIME
TOPICAL | Status: DC | PRN
  Administered 2013-12-24: 1000 mL

## 2013-12-24 MED ORDER — ALUM & MAG HYDROXIDE-SIMETH 200-200-20 MG/5ML PO SUSP: 30.0000 mL | ORAL | Status: DC | PRN

## 2013-12-24 MED ORDER — MORPHINE SULFATE 2 MG/ML IJ SOLN: 2.0000 mg | INTRAMUSCULAR | Status: DC | PRN

## 2013-12-24 MED ORDER — ALLOPURINOL 300 MG PO TABS
300.0000 mg | ORAL_TABLET | Freq: Every day | ORAL | Status: DC
  Administered 2013-12-25 – 2013-12-26 (×2): 300 mg via ORAL
  Filled 2013-12-24 (×3): qty 1

## 2013-12-24 MED ORDER — COLCHICINE 0.6 MG PO TABS
0.6000 mg | ORAL_TABLET | ORAL | Status: DC | PRN
  Filled 2013-12-24: qty 1

## 2013-12-24 MED ORDER — OXYCODONE HCL 5 MG PO TABS
5.0000 mg | ORAL_TABLET | ORAL | Status: DC | PRN
  Administered 2013-12-25 – 2013-12-26 (×3): 5 mg via ORAL
  Filled 2013-12-24 (×4): qty 1

## 2013-12-24 MED ORDER — DEXAMETHASONE 6 MG PO TABS
10.0000 mg | ORAL_TABLET | Freq: Three times a day (TID) | ORAL | Status: AC
  Administered 2013-12-24 – 2013-12-25 (×2): 10 mg via ORAL
  Filled 2013-12-24 (×3): qty 1

## 2013-12-24 MED ORDER — POLYETHYLENE GLYCOL 3350 17 G PO PACK
17.0000 g | PACK | Freq: Two times a day (BID) | ORAL | Status: DC
  Administered 2013-12-24 – 2013-12-26 (×4): 17 g via ORAL
  Filled 2013-12-24 (×7): qty 1

## 2013-12-24 MED ORDER — CEFAZOLIN SODIUM 1-5 GM-% IV SOLN
1.0000 g | Freq: Four times a day (QID) | INTRAVENOUS | Status: AC
  Administered 2013-12-24 (×2): 1 g via INTRAVENOUS
  Filled 2013-12-24 (×2): qty 50

## 2013-12-24 MED ORDER — ONDANSETRON HCL 4 MG/2ML IJ SOLN
INTRAMUSCULAR | Status: DC | PRN
  Administered 2013-12-24 (×2): 4 mg via INTRAVENOUS

## 2013-12-24 MED ORDER — MENTHOL 3 MG MT LOZG
1.0000 | LOZENGE | OROMUCOSAL | Status: DC | PRN
Start: 2013-12-24 — End: 2013-12-26

## 2013-12-24 MED ORDER — METOCLOPRAMIDE HCL 5 MG/ML IJ SOLN
INTRAMUSCULAR | Status: AC
  Filled 2013-12-24: qty 2

## 2013-12-24 MED ORDER — ARTIFICIAL TEARS OP OINT
TOPICAL_OINTMENT | OPHTHALMIC | Status: AC
  Filled 2013-12-24: qty 3.5

## 2013-12-24 MED ORDER — DEXAMETHASONE SODIUM PHOSPHATE 10 MG/ML IJ SOLN
INTRAMUSCULAR | Status: AC
  Filled 2013-12-24: qty 1

## 2013-12-24 MED ORDER — DEXAMETHASONE SODIUM PHOSPHATE 10 MG/ML IJ SOLN
INTRAMUSCULAR | Status: DC | PRN
  Administered 2013-12-24: 10 mg via INTRAVENOUS

## 2013-12-24 MED ORDER — SODIUM CHLORIDE 0.9 % IR SOLN
Status: DC | PRN
  Administered 2013-12-24: 3000 mL

## 2013-12-24 MED ORDER — ROCURONIUM BROMIDE 100 MG/10ML IV SOLN
INTRAVENOUS | Status: DC | PRN
  Administered 2013-12-24: 30 mg via INTRAVENOUS

## 2013-12-24 MED ORDER — METOCLOPRAMIDE HCL 5 MG PO TABS
5.0000 mg | ORAL_TABLET | Freq: Three times a day (TID) | ORAL | Status: DC | PRN
  Filled 2013-12-24: qty 2

## 2013-12-24 MED ORDER — SUCCINYLCHOLINE CHLORIDE 20 MG/ML IJ SOLN
INTRAMUSCULAR | Status: AC
  Filled 2013-12-24: qty 1

## 2013-12-24 MED ORDER — LOSARTAN POTASSIUM 50 MG PO TABS
100.0000 mg | ORAL_TABLET | Freq: Every day | ORAL | Status: DC
  Administered 2013-12-26: 100 mg via ORAL
  Filled 2013-12-24: qty 2

## 2013-12-24 MED ORDER — FENTANYL CITRATE 0.05 MG/ML IJ SOLN
INTRAMUSCULAR | Status: AC
  Filled 2013-12-24: qty 5

## 2013-12-24 MED ORDER — FENTANYL CITRATE 0.05 MG/ML IJ SOLN: 25.0000 ug | INTRAMUSCULAR | Status: DC | PRN

## 2013-12-24 MED ORDER — GLYCOPYRROLATE 0.2 MG/ML IJ SOLN
INTRAMUSCULAR | Status: AC
  Filled 2013-12-24: qty 3

## 2013-12-24 MED ORDER — LIDOCAINE HCL (CARDIAC) 20 MG/ML IV SOLN
INTRAVENOUS | Status: DC | PRN
  Administered 2013-12-24: 60 mg via INTRAVENOUS

## 2013-12-24 MED ORDER — LIDOCAINE HCL (CARDIAC) 20 MG/ML IV SOLN
INTRAVENOUS | Status: AC
  Filled 2013-12-24: qty 5

## 2013-12-24 MED ORDER — METOCLOPRAMIDE HCL 5 MG/ML IJ SOLN
5.0000 mg | Freq: Three times a day (TID) | INTRAMUSCULAR | Status: DC | PRN
  Administered 2013-12-24 (×2): 10 mg via INTRAVENOUS
  Filled 2013-12-24 (×2): qty 2

## 2013-12-24 MED ORDER — BUPIVACAINE-EPINEPHRINE (PF) 0.25% -1:200000 IJ SOLN
INTRAMUSCULAR | Status: AC
Start: 2013-12-24 — End: 2013-12-24
  Filled 2013-12-24: qty 30

## 2013-12-24 MED ORDER — ANASTROZOLE 1 MG PO TABS
1.0000 mg | ORAL_TABLET | Freq: Every day | ORAL | Status: DC
  Administered 2013-12-25 – 2013-12-26 (×2): 1 mg via ORAL
  Filled 2013-12-24 (×3): qty 1

## 2013-12-24 MED ORDER — ONDANSETRON HCL 4 MG/2ML IJ SOLN
INTRAMUSCULAR | Status: AC
  Filled 2013-12-24: qty 2

## 2013-12-24 MED ORDER — ONDANSETRON HCL 4 MG PO TABS: 4.0000 mg | ORAL_TABLET | Freq: Four times a day (QID) | ORAL | Status: DC | PRN

## 2013-12-24 MED ORDER — ROCURONIUM BROMIDE 50 MG/5ML IV SOLN
INTRAVENOUS | Status: AC
  Filled 2013-12-24: qty 1

## 2013-12-24 MED ORDER — SUCCINYLCHOLINE CHLORIDE 20 MG/ML IJ SOLN
INTRAMUSCULAR | Status: DC | PRN
  Administered 2013-12-24: 100 mg via INTRAVENOUS

## 2013-12-24 MED ORDER — ONDANSETRON HCL 4 MG/2ML IJ SOLN
4.0000 mg | Freq: Four times a day (QID) | INTRAMUSCULAR | Status: AC | PRN
  Administered 2013-12-24: 4 mg via INTRAVENOUS

## 2013-12-24 MED ORDER — METOCLOPRAMIDE HCL 5 MG/ML IJ SOLN
INTRAMUSCULAR | Status: DC | PRN
  Administered 2013-12-24: 5 mg via INTRAVENOUS

## 2013-12-24 MED ORDER — NEOSTIGMINE METHYLSULFATE 10 MG/10ML IV SOLN
INTRAVENOUS | Status: DC | PRN
Start: 2013-12-24 — End: 2013-12-24
  Administered 2013-12-24: 3 mg via INTRAVENOUS

## 2013-12-24 MED ORDER — PHENOL 1.4 % MT LIQD: 1.0000 | OROMUCOSAL | Status: DC | PRN

## 2013-12-24 MED ORDER — DOCUSATE SODIUM 100 MG PO CAPS
100.0000 mg | ORAL_CAPSULE | Freq: Two times a day (BID) | ORAL | Status: DC
  Administered 2013-12-24 – 2013-12-26 (×4): 100 mg via ORAL
  Filled 2013-12-24 (×6): qty 1

## 2013-12-24 MED ORDER — PROPOFOL 10 MG/ML IV BOLUS
INTRAVENOUS | Status: AC
  Filled 2013-12-24: qty 20

## 2013-12-24 MED ORDER — PROPOFOL INFUSION 10 MG/ML OPTIME
INTRAVENOUS | Status: DC | PRN
  Administered 2013-12-24: 25 ug/kg/min via INTRAVENOUS

## 2013-12-24 MED ORDER — OXYCODONE HCL 5 MG PO TABS: 5.0000 mg | ORAL_TABLET | Freq: Once | ORAL | Status: DC | PRN

## 2013-12-24 MED ORDER — ARTIFICIAL TEARS OP OINT
TOPICAL_OINTMENT | OPHTHALMIC | Status: DC | PRN
  Administered 2013-12-24: 1 via OPHTHALMIC

## 2013-12-24 MED ORDER — DIPHENHYDRAMINE HCL 12.5 MG/5ML PO ELIX: 12.5000 mg | ORAL_SOLUTION | ORAL | Status: DC | PRN

## 2013-12-24 MED ORDER — FENTANYL CITRATE 0.05 MG/ML IJ SOLN
INTRAMUSCULAR | Status: DC | PRN
  Administered 2013-12-24: 50 ug via INTRAVENOUS
  Administered 2013-12-24: 100 ug via INTRAVENOUS
  Administered 2013-12-24: 150 ug via INTRAVENOUS

## 2013-12-24 MED ORDER — SCOPOLAMINE 1 MG/3DAYS TD PT72
MEDICATED_PATCH | TRANSDERMAL | Status: AC
  Administered 2013-12-24: 1 via TRANSDERMAL
  Filled 2013-12-24: qty 1

## 2013-12-24 MED ORDER — OXYCODONE HCL 5 MG/5ML PO SOLN: 5.0000 mg | Freq: Once | ORAL | Status: DC | PRN

## 2013-12-24 MED ORDER — GLYCOPYRROLATE 0.2 MG/ML IJ SOLN
INTRAMUSCULAR | Status: DC | PRN
  Administered 2013-12-24: 0.6 mg via INTRAVENOUS

## 2013-12-24 MED ORDER — NEOSTIGMINE METHYLSULFATE 10 MG/10ML IV SOLN
INTRAVENOUS | Status: AC
  Filled 2013-12-24: qty 2

## 2013-12-24 MED ORDER — KETOROLAC TROMETHAMINE 15 MG/ML IJ SOLN
7.5000 mg | Freq: Four times a day (QID) | INTRAMUSCULAR | Status: AC
  Administered 2013-12-24 – 2013-12-25 (×3): 7.5 mg via INTRAVENOUS
  Filled 2013-12-24: qty 1

## 2013-12-24 MED ORDER — ONDANSETRON HCL 4 MG/2ML IJ SOLN: 4.0000 mg | Freq: Four times a day (QID) | INTRAMUSCULAR | Status: DC | PRN

## 2013-12-24 MED ORDER — POTASSIUM CHLORIDE CRYS ER 20 MEQ PO TBCR
20.0000 meq | EXTENDED_RELEASE_TABLET | Freq: Every day | ORAL | Status: DC
  Administered 2013-12-25: 20 meq via ORAL
  Filled 2013-12-24 (×4): qty 1

## 2013-12-24 MED ORDER — ACETAMINOPHEN 325 MG PO TABS: 650.0000 mg | ORAL_TABLET | Freq: Four times a day (QID) | ORAL | Status: DC | PRN

## 2013-12-24 MED ORDER — BUPIVACAINE-EPINEPHRINE (PF) 0.5% -1:200000 IJ SOLN
INTRAMUSCULAR | Status: DC | PRN
  Administered 2013-12-24: 30 mL via PERINEURAL

## 2013-12-24 MED ORDER — RIVAROXABAN 10 MG PO TABS
10.0000 mg | ORAL_TABLET | Freq: Every day | ORAL | Status: DC
  Administered 2013-12-25 – 2013-12-26 (×2): 10 mg via ORAL
  Filled 2013-12-24 (×3): qty 1

## 2013-12-24 MED ORDER — SIMVASTATIN 40 MG PO TABS
40.0000 mg | ORAL_TABLET | Freq: Every evening | ORAL | Status: DC
  Administered 2013-12-24 – 2013-12-25 (×2): 40 mg via ORAL
  Filled 2013-12-24 (×3): qty 1

## 2013-12-24 MED ORDER — BUPIVACAINE-EPINEPHRINE 0.25% -1:200000 IJ SOLN
INTRAMUSCULAR | Status: DC | PRN
  Administered 2013-12-24: 30 mL

## 2013-12-24 MED ORDER — ALBUTEROL SULFATE (2.5 MG/3ML) 0.083% IN NEBU: 2.5000 mg | INHALATION_SOLUTION | RESPIRATORY_TRACT | Status: DC | PRN

## 2013-12-24 MED ORDER — PANTOPRAZOLE SODIUM 40 MG PO TBEC
40.0000 mg | DELAYED_RELEASE_TABLET | Freq: Every day | ORAL | Status: DC
  Administered 2013-12-25 – 2013-12-26 (×2): 40 mg via ORAL
  Filled 2013-12-24 (×2): qty 1

## 2013-12-24 MED ORDER — PROPOFOL 10 MG/ML IV BOLUS
INTRAVENOUS | Status: DC | PRN
  Administered 2013-12-24: 75 mg via INTRAVENOUS
  Administered 2013-12-24: 125 mg via INTRAVENOUS

## 2013-12-24 MED ORDER — CHLORHEXIDINE GLUCONATE 4 % EX LIQD
60.0000 mL | Freq: Once | CUTANEOUS | Status: DC
  Filled 2013-12-24: qty 60

## 2013-12-24 MED ORDER — ACETAMINOPHEN 650 MG RE SUPP: 650.0000 mg | Freq: Four times a day (QID) | RECTAL | Status: DC | PRN

## 2013-12-24 MED ORDER — EPHEDRINE SULFATE 50 MG/ML IJ SOLN
INTRAMUSCULAR | Status: AC
  Filled 2013-12-24: qty 1

## 2013-12-24 MED ORDER — SODIUM CHLORIDE 0.9 % IJ SOLN
INTRAMUSCULAR | Status: AC
  Filled 2013-12-24: qty 10

## 2013-12-24 MED ORDER — DEXAMETHASONE SODIUM PHOSPHATE 10 MG/ML IJ SOLN
10.0000 mg | Freq: Three times a day (TID) | INTRAMUSCULAR | Status: AC
  Administered 2013-12-24: 10 mg via INTRAVENOUS
  Filled 2013-12-24 (×3): qty 1

## 2013-12-24 MED ORDER — POTASSIUM CHLORIDE IN NACL 20-0.9 MEQ/L-% IV SOLN
INTRAVENOUS | Status: DC
  Administered 2013-12-24 – 2013-12-25 (×2): via INTRAVENOUS
  Filled 2013-12-24 (×7): qty 1000

## 2013-12-24 MED ORDER — DARIFENACIN HYDROBROMIDE ER 15 MG PO TB24
15.0000 mg | ORAL_TABLET | Freq: Every day | ORAL | Status: DC
  Administered 2013-12-25 – 2013-12-26 (×2): 15 mg via ORAL
  Filled 2013-12-24 (×3): qty 1

## 2013-12-24 MED ORDER — LACTATED RINGERS IV SOLN
INTRAVENOUS | Status: DC | PRN
  Administered 2013-12-24 (×2): via INTRAVENOUS

## 2013-12-24 SURGICAL SUPPLY — 78 items
BANDAGE ELASTIC 6 VELCRO ST LF (GAUZE/BANDAGES/DRESSINGS) ×2 IMPLANT
BANDAGE ESMARK 6X9 LF (GAUZE/BANDAGES/DRESSINGS) ×1 IMPLANT
BLADE SAGITTAL 25.0X1.19X90 (BLADE) ×2 IMPLANT
BLADE SAGITTAL 25.0X1.19X90MM (BLADE) ×1
BLADE SAW SGTL 11.0X1.19X90.0M (BLADE) IMPLANT
BLADE SAW SGTL 13.0X1.19X90.0M (BLADE) ×3 IMPLANT
BLADE SURG 10 STRL SS (BLADE) ×6 IMPLANT
BNDG CMPR 9X6 STRL LF SNTH (GAUZE/BANDAGES/DRESSINGS) ×1
BNDG CMPR MED 15X6 ELC VLCR LF (GAUZE/BANDAGES/DRESSINGS) ×1
BNDG ELASTIC 6X15 VLCR STRL LF (GAUZE/BANDAGES/DRESSINGS) ×3 IMPLANT
BNDG ESMARK 6X9 LF (GAUZE/BANDAGES/DRESSINGS) ×3
BOWL SMART MIX CTS (DISPOSABLE) ×3 IMPLANT
CAPT RP KNEE ×2 IMPLANT
CEMENT HV SMART SET (Cement) ×6 IMPLANT
CLOSURE STERI-STRIP 1/2X4 (GAUZE/BANDAGES/DRESSINGS) ×1
CLOSURE WOUND 1/2 X4 (GAUZE/BANDAGES/DRESSINGS) ×1
CLSR STERI-STRIP ANTIMIC 1/2X4 (GAUZE/BANDAGES/DRESSINGS) ×1 IMPLANT
COVER SURGICAL LIGHT HANDLE (MISCELLANEOUS) ×3 IMPLANT
CUFF TOURNIQUET SINGLE 34IN LL (TOURNIQUET CUFF) ×3 IMPLANT
CUFF TOURNIQUET SINGLE 44IN (TOURNIQUET CUFF) IMPLANT
DRAPE EXTREMITY T 121X128X90 (DRAPE) ×3 IMPLANT
DRAPE INCISE IOBAN 66X45 STRL (DRAPES) ×3 IMPLANT
DRAPE PROXIMA HALF (DRAPES) ×3 IMPLANT
DRAPE U-SHAPE 47X51 STRL (DRAPES) ×3 IMPLANT
DRSG ADAPTIC 3X8 NADH LF (GAUZE/BANDAGES/DRESSINGS) ×3 IMPLANT
DRSG AQUACEL AG ADV 3.5X14 (GAUZE/BANDAGES/DRESSINGS) ×2 IMPLANT
DRSG PAD ABDOMINAL 8X10 ST (GAUZE/BANDAGES/DRESSINGS) ×5 IMPLANT
DURAPREP 26ML APPLICATOR (WOUND CARE) ×6 IMPLANT
ELECT CAUTERY BLADE 6.4 (BLADE) ×3 IMPLANT
ELECT REM PT RETURN 9FT ADLT (ELECTROSURGICAL) ×3
ELECTRODE REM PT RTRN 9FT ADLT (ELECTROSURGICAL) ×1 IMPLANT
EVACUATOR 1/8 PVC DRAIN (DRAIN) ×3 IMPLANT
FACESHIELD WRAPAROUND (MASK) ×3 IMPLANT
FACESHIELD WRAPAROUND OR TEAM (MASK) ×1 IMPLANT
GAUZE SPONGE 4X4 12PLY STRL (GAUZE/BANDAGES/DRESSINGS) ×3 IMPLANT
GAUZE SPONGE 4X4 16PLY XRAY LF (GAUZE/BANDAGES/DRESSINGS) ×2 IMPLANT
GLOVE BIO SURGEON STRL SZ7 (GLOVE) ×5 IMPLANT
GLOVE BIOGEL PI IND STRL 6.5 (GLOVE) IMPLANT
GLOVE BIOGEL PI IND STRL 7.0 (GLOVE) ×1 IMPLANT
GLOVE BIOGEL PI IND STRL 7.5 (GLOVE) ×1 IMPLANT
GLOVE BIOGEL PI INDICATOR 6.5 (GLOVE) ×2
GLOVE BIOGEL PI INDICATOR 7.0 (GLOVE) ×2
GLOVE BIOGEL PI INDICATOR 7.5 (GLOVE) ×4
GLOVE ECLIPSE 6.5 STRL STRAW (GLOVE) ×2 IMPLANT
GLOVE ECLIPSE 7.0 STRL STRAW (GLOVE) ×2 IMPLANT
GLOVE ECLIPSE 8.0 STRL XLNG CF (GLOVE) ×2 IMPLANT
GLOVE SS BIOGEL STRL SZ 7.5 (GLOVE) ×1 IMPLANT
GLOVE SUPERSENSE BIOGEL SZ 7.5 (GLOVE) ×2
GOWN STRL REUS W/ TWL LRG LVL3 (GOWN DISPOSABLE) ×2 IMPLANT
GOWN STRL REUS W/ TWL XL LVL3 (GOWN DISPOSABLE) ×2 IMPLANT
GOWN STRL REUS W/TWL LRG LVL3 (GOWN DISPOSABLE) ×6
GOWN STRL REUS W/TWL XL LVL3 (GOWN DISPOSABLE) ×6
HANDPIECE INTERPULSE COAX TIP (DISPOSABLE) ×3
HOOD PEEL AWAY FACE SHEILD DIS (HOOD) ×6 IMPLANT
IMMOBILIZER KNEE 22 UNIV (SOFTGOODS) ×2 IMPLANT
KIT BASIN OR (CUSTOM PROCEDURE TRAY) ×3 IMPLANT
KIT ROOM TURNOVER OR (KITS) ×3 IMPLANT
MANIFOLD NEPTUNE II (INSTRUMENTS) ×3 IMPLANT
NS IRRIG 1000ML POUR BTL (IV SOLUTION) ×3 IMPLANT
PACK TOTAL JOINT (CUSTOM PROCEDURE TRAY) ×3 IMPLANT
PAD ARMBOARD 7.5X6 YLW CONV (MISCELLANEOUS) ×6 IMPLANT
PADDING CAST COTTON 6X4 STRL (CAST SUPPLIES) ×3 IMPLANT
PEN SKIN MARKING BROAD (MISCELLANEOUS) ×2 IMPLANT
RUBBERBAND STERILE (MISCELLANEOUS) ×3 IMPLANT
SET HNDPC FAN SPRY TIP SCT (DISPOSABLE) ×1 IMPLANT
STRIP CLOSURE SKIN 1/2X4 (GAUZE/BANDAGES/DRESSINGS) ×2 IMPLANT
SUCTION FRAZIER TIP 10 FR DISP (SUCTIONS) ×3 IMPLANT
SUT ETHIBOND NAB CT1 #1 30IN (SUTURE) ×6 IMPLANT
SUT MNCRL AB 3-0 PS2 18 (SUTURE) ×3 IMPLANT
SUT VIC AB 0 CT1 27 (SUTURE) ×6
SUT VIC AB 0 CT1 27XBRD ANBCTR (SUTURE) ×2 IMPLANT
SUT VIC AB 2-0 CT1 27 (SUTURE) ×6
SUT VIC AB 2-0 CT1 TAPERPNT 27 (SUTURE) ×2 IMPLANT
SYR 30ML SLIP (SYRINGE) ×3 IMPLANT
TOWEL OR 17X24 6PK STRL BLUE (TOWEL DISPOSABLE) ×3 IMPLANT
TOWEL OR 17X26 10 PK STRL BLUE (TOWEL DISPOSABLE) ×3 IMPLANT
TRAY FOLEY CATH 16FR SILVER (SET/KITS/TRAYS/PACK) ×3 IMPLANT
WATER STERILE IRR 1000ML POUR (IV SOLUTION) ×2 IMPLANT

## 2013-12-24 NOTE — Plan of Care (Signed)
Problem: Consults Goal: Diagnosis- Total Joint Replacement Primary Total Knee Left     

## 2013-12-24 NOTE — Progress Notes (Signed)
Medicated for nausea with antiemetic

## 2013-12-24 NOTE — Interval H&P Note (Signed)
History and Physical Interval Note:  12/24/2013 7:41 AM  Melissa Morton  has presented today for surgery, with the diagnosis of LEFT KNEE DJD  The various methods of treatment have been discussed with the patient and family. After consideration of risks, benefits and other options for treatment, the patient has consented to  Procedure(s): TOTAL KNEE ARTHROPLASTY (Left) as a surgical intervention .  The patient's history has been reviewed, patient examined, no change in status, stable for surgery.  I have reviewed the patient's chart and labs.  Questions were answered to the patient's satisfaction.     [name]

## 2013-12-24 NOTE — Anesthesia Preprocedure Evaluation (Addendum)
Anesthesia Evaluation  Patient identified by MRN, date of birth, ID band Patient awake    Reviewed: Allergy & Precautions, H&P , NPO status , Patient's Chart, lab work & pertinent test results  History of Anesthesia Complications (+) PONV and history of anesthetic complications  Airway Mallampati: II  Neck ROM: full    Dental  (+) Dental Advidsory Given, Edentulous Upper, Edentulous Lower   Pulmonary former smoker,          Cardiovascular hypertension, Rhythm:regular     Neuro/Psych    GI/Hepatic GERD-  Medicated and Controlled,  Endo/Other    Renal/GU Renal InsufficiencyRenal disease     Musculoskeletal  (+) Arthritis -,   Abdominal   Peds  Hematology   Anesthesia Other Findings   Reproductive/Obstetrics                         Anesthesia Physical Anesthesia Plan  ASA: III  Anesthesia Plan: General and Regional   Post-op Pain Management: MAC Combined w/ Regional for Post-op pain   Induction: Intravenous  Airway Management Planned: Oral ETT  Additional Equipment:   Intra-op Plan:   Post-operative Plan: Extubation in OR  Informed Consent: I have reviewed the patients History and Physical, chart, labs and discussed the procedure including the risks, benefits and alternatives for the proposed anesthesia with the patient or authorized representative who has indicated his/her understanding and acceptance.   Dental Advisory Given  Plan Discussed with: CRNA, Surgeon and Anesthesiologist  Anesthesia Plan Comments:        Anesthesia Quick Evaluation

## 2013-12-24 NOTE — Transfer of Care (Signed)
Immediate Anesthesia Transfer of Care Note  Patient: Melissa Morton  Procedure(s) Performed: Procedure(s): LEFT TOTAL KNEE ARTHROPLASTY (Left)  Patient Location: PACU  Anesthesia Type:General  Level of Consciousness: awake  Airway & Oxygen Therapy: Patient Spontanous Breathing and Patient connected to nasal cannula oxygen  Post-op Assessment: Report given to PACU RN, Post -op Vital signs reviewed and stable and Patient moving all extremities X 4  Post vital signs: Reviewed and stable  Complications: No apparent anesthesia complications

## 2013-12-24 NOTE — Evaluation (Signed)
Physical Therapy Evaluation Patient Details Name: Melissa Morton MRN: [id number] DOB: 05/20/1933 Today's Date: 12/24/2013   History of Present Illness  Pt. is a 79 y/o female admitted s/p L TKA  Clinical Impression  Pt is s/p TKA resulting in the deficits listed below (see PT Problem List).  Pt will benefit from skilled PT to increase their independence and safety with mobility to allow discharge to the venue listed below.     Follow Up Recommendations Home health PT;Supervision/Assistance - 24 hour    Equipment Recommendations  None recommended by PT    Recommendations for Other Services       Precautions / Restrictions Precautions Precautions: Knee Precaution Booklet Issued: No Required Braces or Orthoses: Knee Immobilizer - Left Knee Immobilizer - Left: On except when in CPM Restrictions Weight Bearing Restrictions: Yes LLE Weight Bearing: Weight bearing as tolerated      Mobility  Bed Mobility Overal bed mobility: Needs Assistance Bed Mobility: Supine to Sit     Supine to sit: Min assist;HOB elevated     General bed mobility comments: min A needed for LLE control/support  Transfers Overall transfer level: Needs assistance Equipment used: Rolling walker (2 wheeled) Transfers: Sit to/from Stand Sit to Stand: Min assist         General transfer comment: min cues for safe technique  Ambulation/Gait Ambulation/Gait assistance: Min assist Ambulation Distance (Feet): 5 Feet Assistive device: Rolling walker (2 wheeled) Gait Pattern/deviations: Step-to pattern Gait velocity: decreased   General Gait Details: cues to use UEs to decrease pain/LLE weight bearing  Stairs            Wheelchair Mobility    Modified Rankin (Stroke Patients Only)       Balance Overall balance assessment: Needs assistance Sitting-balance support: Feet supported;No upper extremity supported Sitting balance-Leahy Scale: Good     Standing balance support: Bilateral  upper extremity supported;During functional activity Standing balance-Leahy Scale: Poor                               Pertinent Vitals/Pain Pain Assessment: No/denies pain    Home Living Family/patient expects to be discharged to:: Private residence Living Arrangements: Children Available Help at Discharge: Available 24 hours/day;Family Type of Home: House Home Access: Stairs to enter Entrance Stairs-Rails: None Entrance Stairs-Number of Steps: 1 Home Layout: One level;Able to live on main level with bedroom/bathroom Home Equipment: Bedside commode;Cane - single point;Walker - 2 wheels      Prior Function Level of Independence: Independent with assistive device(s)         Comments: amb with cane     Hand Dominance   Dominant Hand: Right    Extremity/Trunk Assessment   Upper Extremity Assessment: Defer to OT evaluation           Lower Extremity Assessment: LLE deficits/detail      Cervical / Trunk Assessment: Normal  Communication   Communication: No difficulties  Cognition Arousal/Alertness: Awake/alert Behavior During Therapy: WFL for tasks assessed/performed Overall Cognitive Status: Within Functional Limits for tasks assessed                      General Comments General comments (skin integrity, edema, etc.): pt. limited by nausea during session.  Overall mobilizing well and anticipate pt. will progress quickly    Exercises Total Joint Exercises Ankle Circles/Pumps: AROM;Both;10 reps;Seated Quad Sets: AROM;Left;10 reps;Seated      Assessment/Plan  PT Assessment Patient needs continued PT services  PT Diagnosis Difficulty walking;Acute pain   PT Problem List Decreased range of motion;Decreased activity tolerance;Decreased mobility;Decreased balance;Decreased knowledge of use of DME;Decreased safety awareness;Decreased knowledge of precautions;Pain  PT Treatment Interventions DME instruction;Gait training;Stair  training;Functional mobility training;Therapeutic activities;Therapeutic exercise;Balance training;Neuromuscular re-education;Patient/family education   PT Goals (Current goals can be found in the Care Plan section) Acute Rehab PT Goals Patient Stated Goal: to go home PT Goal Formulation: With patient/family Time For Goal Achievement: 12/31/13 Potential to Achieve Goals: Good    Frequency 7X/week   Barriers to discharge        Co-evaluation               End of Session Equipment Utilized During Treatment: Gait belt;Left knee immobilizer Activity Tolerance: Patient tolerated treatment well;Treatment limited secondary to medical complications (Comment) (nausea) Patient left: in chair;with call bell/phone within reach;with family/visitor present Nurse Communication: Mobility status;Weight bearing status;Precautions         Time: 1440-1502 PT Time Calculation (min): 22 min   Charges:   PT Evaluation $Initial PT Evaluation Tier I: 1 Procedure PT Treatments $Therapeutic Activity: 8-22 mins   PT G CodesSiri Morton 12/24/2013, 3:13 PM  Melissa Morton, PT, DPT [phone]

## 2013-12-24 NOTE — Op Note (Signed)
MRN:     [id number] DOB/AGE:    78/01/1934 / 80 y.o.       OPERATIVE REPORT    DATE OF PROCEDURE:  12/24/2013       PREOPERATIVE DIAGNOSIS:   LEFT KNEE DJD      Estimated body mass index is 29.17 kg/(m^2) as calculated from the following:   Height as of this encounter: 5\' 4"  (1.626 m).   Weight as of this encounter: 77.111 kg (170 lb).                                                        POSTOPERATIVE DIAGNOSIS:   LEFT KNEE DJD                                                                      PROCEDURE:  Procedure(s): LEFT TOTAL KNEE ARTHROPLASTY Using Depuy Sigma RP implants #2 Femur, #2Tibia, 12.80mm sigma RP bearing, 32 Patella     SURGEON: Ayza Ripoll A    ASSISTANT:  Kirstin Shepperson PA-C   (Present and scrubbed throughout the case, critical for assistance with exposure, retraction, instrumentation, and closure.)         ANESTHESIA: GET with Femoral Nerve Block  DRAINS: foley, 2 medium hemovac in knee   TOURNIQUET TIME: 67MCN   COMPLICATIONS:  None     SPECIMENS: None   INDICATIONS FOR PROCEDURE: The patient has  LEFT KNEE DJD, varus deformities, XR shows bone on bone arthritis. Patient has failed all conservative measures including anti-inflammatory medicines, narcotics, attempts at  exercise and weight loss, cortisone injections and viscosupplementation.  Risks and benefits of surgery have been discussed, questions answered.   DESCRIPTION OF PROCEDURE: The patient identified by armband, received  right femoral nerve block and IV antibiotics, in the holding area at [facility]. Patient taken to the operating room, appropriate anesthetic  monitors were attached General endotracheal anesthesia induced with  the patient in supine position, Foley catheter was inserted. Tourniquet  applied high to the operative thigh. Lateral post and foot positioner  applied to the table, the lower extremity was then prepped and draped  in usual sterile fashion from the ankle to  the tourniquet. Time-out procedure was performed. The limb was wrapped with an Esmarch bandage and the tourniquet inflated to 365 mmHg. We began the operation by making the anterior midline incision starting at handbreadth above the patella going over the patella 1 cm medial to and  4 cm distal to the tibial tubercle. Small bleeders in the skin and the  subcutaneous tissue identified and cauterized. Transverse retinaculum was incised and reflected medially and a medial parapatellar arthrotomy was accomplished. the patella was everted and theprepatellar fat pad resected. The superficial medial collateral  ligament was then elevated from anterior to posterior along the proximal  flare of the tibia and anterior half of the menisci resected. The knee was hyperflexed exposing bone on bone arthritis. Peripheral and notch osteophytes as well as the cruciate ligaments were then resected. We continued to  work our way around posteriorly along the proximal tibia, and  externally  rotated the tibia subluxing it out from underneath the femur. A McHale  retractor was placed through the notch and a lateral Hohmann retractor  placed, and we then drilled through the proximal tibia in line with the  axis of the tibia followed by an intramedullary guide rod and 2-degree  posterior slope cutting guide. The tibial cutting guide was pinned into place  allowing resection of 4 mm of bone medially and about 6 mm of bone  laterally because of her varus deformity. Satisfied with the tibial resection, we then  entered the distal femur 2 mm anterior to the PCL origin with the  intramedullary guide rod and applied the distal femoral cutting guide  set at 60mm, with 5 degrees of valgus. This was pinned along the  epicondylar axis. At this point, the distal femoral cut was accomplished without difficulty. We then sized for a #2 femoral component and pinned the guide in 3 degrees of external rotation.The chamfer cutting guide was  pinned into place. The anterior, posterior, and chamfer cuts were accomplished without difficulty followed by  the Sigma RP box cutting guide and the box cut. We also removed posterior osteophytes from the posterior femoral condyles. At this  time, the knee was brought into full extension. We checked our  extension and flexion gaps and found them symmetric at 12.48mm.  The patella thickness measured at 22 mm. We set the cutting guide at 13 and removed the posterior 9.5-10 mm  of the patella sized for 32 button and drilled the lollipop. The knee  was then once again hyperflexed exposing the proximal tibia. We sized for a #2 tibial base plate, applied the smokestack and the conical reamer followed by the the Delta fin keel punch. We then hammered into place the Sigma RP trial femoral component, inserted a 12.5-mm trial bearing, trial patellar button, and took the knee through range of motion from 0-130 degrees. No thumb pressure was required for patellar  tracking. At this point, all trial components were removed, a double batch of DePuy HV cement  was mixed and applied to all bony metallic mating surfaces except for the posterior condyles of the femur itself. In order, we  hammered into place the tibial tray and removed excess cement, the femoral component and removed excess cement, a 12.5-mm Sigma RP bearing  was inserted, and the knee brought to full extension with compression.  The patellar button was clamped into place, and excess cement  removed. While the cement cured the wound was irrigated out with normal saline solution pulse lavage, and medium Hemovac drains were placed.. Ligament stability and patellar tracking were checked and found to be excellent. The tourniquet was then released and hemostasis was obtained with cautery. The parapatellar arthrotomy was closed with  #1 ethibond suture. The subcutaneous tissue with 0 and 2-0 undyed  Vicryl suture, and 4-0 Monocryl.. A dressing of Xeroform,  4  x 4, dressing sponges, Webril, and Ace wrap applied. Needle and sponge count were correct times 2.The patient awakened, extubated, and taken to recovery room without difficulty. Vascular status was normal, pulses 2+ and symmetric.   Melissa Morton A 12/24/2013, 9:28 AM

## 2013-12-24 NOTE — Anesthesia Postprocedure Evaluation (Signed)
Anesthesia Post Note  Patient: Melissa Morton  Procedure(s) Performed: Procedure(s) (LRB): LEFT TOTAL KNEE ARTHROPLASTY (Left)  Anesthesia type: General  Patient location: PACU  Post pain: Pain level controlled and Adequate analgesia  Post assessment: Post-op Vital signs reviewed, Patient's Cardiovascular Status Stable, Respiratory Function Stable, Patent Airway and Pain level controlled  Last Vitals:  Filed Vitals:   12/24/13 1030  BP: 159/74  Pulse: 94  Temp:   Resp: 16    Post vital signs: Reviewed and stable  Level of consciousness: awake, alert  and oriented  Complications: No apparent anesthesia complications

## 2013-12-24 NOTE — Progress Notes (Signed)
Orthopedic Tech Progress Note Patient Details:  Melissa Morton 1933-10-28 [id number] CPM applied to LLE with appropriate settings. OHF applied to bed. Footsie roll provided.  CPM Left Knee CPM Left Knee: On Left Knee Flexion (Degrees): 60 Left Knee Extension (Degrees): 0   Asia R Thompson 12/24/2013, 11:04 AM

## 2013-12-24 NOTE — Progress Notes (Signed)
Utilization review completed.  

## 2013-12-24 NOTE — Anesthesia Procedure Notes (Signed)
Anesthesia Regional Block:  Adductor canal block  Pre-Anesthetic Checklist: ,, timeout performed, Correct Patient, Correct Site, Correct Laterality, Correct Procedure, Correct Position, site marked, Risks and benefits discussed,  Surgical consent,  Pre-op evaluation,  At surgeon's request and post-op pain management  Laterality: Left  Prep: chloraprep       Needles:   Needle Type: Echogenic Needle     Needle Length: 9cm 9 cm Needle Gauge: 21 and 21 G    Additional Needles:  Procedures: ultrasound guided (picture in chart) Adductor canal block Narrative:  Start time: 12/24/2013 7:39 AM End time: 12/24/2013 7:50 AM Injection made incrementally with aspirations every 5 mL.  Performed by: Personally  Anesthesiologist: Dr Marcie Bal  Additional Notes: Pt tolerated the procedure well.

## 2013-12-24 NOTE — Progress Notes (Signed)
Orthopedic Tech Progress Note Patient Details:  Melissa Morton Sep 13, 1933 [id number]  Patient ID: [name] Found, female   DOB: May 06, 1933, 80 y.o.   MRN: [id number] Placed pt's lle in cpm @0 -60 degrees @ 2050  Hildred Priest 12/24/2013, 8:53 PM

## 2013-12-25 ENCOUNTER — Encounter (HOSPITAL_COMMUNITY): Payer: Self-pay | Admitting: Orthopedic Surgery

## 2013-12-25 DIAGNOSIS — N390 Urinary tract infection, site not specified: Secondary | ICD-10-CM | POA: Diagnosis present

## 2013-12-25 LAB — CBC
HCT: 34.3 % — ABNORMAL LOW (ref 36.0–46.0)
HEMOGLOBIN: 11.6 g/dL — AB (ref 12.0–15.0)
MCH: 30.2 pg (ref 26.0–34.0)
MCHC: 33.8 g/dL (ref 30.0–36.0)
MCV: 89.3 fL (ref 78.0–100.0)
Platelets: 196 10*3/uL (ref 150–400)
RBC: 3.84 MIL/uL — ABNORMAL LOW (ref 3.87–5.11)
RDW: 13.5 % (ref 11.5–15.5)
WBC: 20.2 10*3/uL — ABNORMAL HIGH (ref 4.0–10.5)

## 2013-12-25 LAB — BASIC METABOLIC PANEL
Anion gap: 14 (ref 5–15)
BUN: 20 mg/dL (ref 6–23)
CO2: 20 mEq/L (ref 19–32)
Calcium: 8.4 mg/dL (ref 8.4–10.5)
Chloride: 105 mEq/L (ref 96–112)
Creatinine, Ser: 1.22 mg/dL — ABNORMAL HIGH (ref 0.50–1.10)
GFR, EST AFRICAN AMERICAN: 48 mL/min — AB (ref >90)
GFR, EST NON AFRICAN AMERICAN: 41 mL/min — AB (ref >90)
Glucose, Bld: 141 mg/dL — ABNORMAL HIGH (ref 70–99)
Potassium: 4.7 mEq/L (ref 3.7–5.3)
SODIUM: 139 meq/L (ref 137–147)

## 2013-12-25 MED ORDER — KETOROLAC TROMETHAMINE 15 MG/ML IJ SOLN
INTRAMUSCULAR | Status: AC
  Filled 2013-12-25: qty 1

## 2013-12-25 MED ORDER — DEXTROSE 5 % IV SOLN
2.0000 g | INTRAVENOUS | Status: DC
  Administered 2013-12-25 – 2013-12-26 (×2): 2 g via INTRAVENOUS
  Filled 2013-12-25 (×2): qty 2

## 2013-12-25 MED ADMIN — Ketorolac Tromethamine Inj 15 MG/ML: INTRAVENOUS | @ 06:00:00

## 2013-12-25 NOTE — Progress Notes (Signed)
Subjective: 1 Day Post-Op Procedure(s) (LRB): LEFT TOTAL KNEE ARTHROPLASTY (Left) Patient reports pain as 3 on 0-10 scale.    Objective: Vital signs in last 24 hours: Temp:  [97.6 F (36.4 C)-98.6 F (37 C)] 98 F (36.7 C) (08/25 0618) Pulse Rate:  [70-97] 70 (08/25 0618) Resp:  [13-22] 20 (08/25 0400) BP: (143-172)/(64-80) 145/73 mmHg (08/25 0618) SpO2:  [94 %-100 %] 98 % (08/25 0618)  Intake/Output from previous day: 08/24 0701 - 08/25 0700 In: 2171.7 [P.O.:360; I.V.:1811.7] Out: 625 [Urine:475; Drains:150] Intake/Output this shift:     Recent Labs  12/25/13 0608  HGB 11.6*    Recent Labs  12/25/13 0608  WBC 20.2*  RBC 3.84*  HCT 34.3*  PLT 196    Recent Labs  12/25/13 0608  NA 139  K 4.7  CL 105  CO2 20  BUN 20  CREATININE 1.22*  GLUCOSE 141*  CALCIUM 8.4   No results found for this basename: LABPT, INR,  in the last 72 hours  Neurologically intact ABD soft Neurovascular intact Sensation intact distally Intact pulses distally Dorsiflexion/Plantar flexion intact Incision: dressing C/D/I Patient is having lots of difficulty swallowing and burping.  Will continue Reglan, Miralax and Colace to enhance GI motility Assessment/Plan: 1 Day Post-Op Procedure(s) (LRB): LEFT TOTAL KNEE ARTHROPLASTY (Left) Advance diet Up with therapy Plan for discharge tomorrow Starting IV Rocephin to continue treatment of a preop UTI-Klebsiella.    Drain pulled by me today.     Antavious Spanos J 12/25/2013, 8:25 AM

## 2013-12-25 NOTE — Care Management Note (Signed)
CARE MANAGEMENT NOTE 12/25/2013  Patient:  Melissa Morton, Melissa Morton   Account Number:  [id number]  Date Initiated:  12/25/2013  Documentation initiated by:  Ricki Miller  Subjective/Objective Assessment:   80 yr old female admitted with left knee DJD, s/p Left total knee arthroplasty.Patient is being treated with IV Rocephin for UTI.     Action/Plan:   Case manager spoke with patient concerning home health and DME needs at discharge. Choice offered. 3:08pm Referral called to [place], [place] liaison.   Anticipated DC Date:  12/26/2013   Anticipated DC Plan:  [place]  CM consult      [facility] Choice  HOME HEALTH  DURABLE MEDICAL EQUIPMENT   Choice offered to / List presented to:  C-1 Patient   DME arranged  [facility]  3-N-1  CPM      DME agency  [employer]     [place] arranged  HH-2 PT      [place].   Status of service:  Completed, signed off Medicare Important Message given?  NA - LOS <3 / Initial given by admissions (If response is "NO", the following Medicare IM given date fields will be blank) Date Medicare IM given:   Medicare IM given by:   Date Additional Medicare IM given:   Additional Medicare IM given by:    Discharge Disposition:  [place]  Per UR Regulation:  Reviewed for med. necessity/level of care/duration of stay  If discussed at [place] of Stay Meetings, dates discussed:    Comments:  12/25/13 [place], RN BSN Case Manager Patient states she will be going to her daughter's home for recovery:  [place], [place]  Patient's cell [phone].

## 2013-12-25 NOTE — Discharge Instructions (Signed)
Information on my medicine - XARELTO (Rivaroxaban)  This medication education was reviewed with me or my healthcare representative as part of my discharge preparation.  Why was Xarelto prescribed for you? Xarelto was prescribed for you to reduce the risk of blood clots forming after orthopedic surgery. The medical term for these abnormal blood clots is venous thromboembolism (VTE).  What do you need to know about xarelto ? Take your Xarelto 10 mg ONCE DAILY at the same time every day. You may take it either with or without food.  If you have difficulty swallowing the tablet whole, you may crush it and mix in applesauce just prior to taking your dose.  Take Xarelto exactly as prescribed by your doctor and DO NOT stop taking Xarelto without talking to the doctor who prescribed the medication.  Stopping without other VTE prevention medication to take the place of Xarelto may increase your risk of developing a clot.  After discharge, you should have regular check-up appointments with your healthcare provider that is prescribing your Xarelto.    What do you do if you miss a dose? If you miss a dose, take it as soon as you remember on the same day then continue your regularly scheduled once daily regimen the next day. Do not take two doses of Xarelto on the same day.   Important Safety Information A possible side effect of Xarelto is bleeding. You should call your healthcare provider right away if you experience any of the following:   Bleeding from an injury or your nose that does not stop.   Unusual colored urine (red or dark brown) or unusual colored stools (red or black).   Unusual bruising for unknown reasons.   A serious fall or if you hit your head (even if there is no bleeding).  Some medicines may interact with Xarelto and might increase your risk of bleeding while on Xarelto. To help avoid this, consult your healthcare provider or pharmacist prior to using any new  prescription or non-prescription medications, including herbals, vitamins, non-steroidal anti-inflammatory drugs (NSAIDs) and supplements.  This website has more information on Xarelto: [email or web address]

## 2013-12-25 NOTE — Progress Notes (Signed)
Physical Therapy Treatment Patient Details Name: Melissa Morton MRN: [id number] DOB: 03-13-1934 Today's Date: 12/25/2013    History of Present Illness Pt. is a 80 y/o female admitted s/p L TKA    PT Comments    Patient progressing very well this session. Able to ambulate in hallway without difficulty or increased pain. Will incorporate more therex this afternoon. Patient is planning to DC tomorrow.   Follow Up Recommendations  Home health PT;Supervision/Assistance - 24 hour     Equipment Recommendations  None recommended by PT    Recommendations for Other Services       Precautions / Restrictions Precautions Precautions: Knee Precaution Comments: reviewed precautions  Required Braces or Orthoses: Knee Immobilizer - Left Knee Immobilizer - Left: On except when in CPM Restrictions Weight Bearing Restrictions: Yes LLE Weight Bearing: Weight bearing as tolerated    Mobility  Bed Mobility               General bed mobility comments: Pt in recliner  Transfers Overall transfer level: Needs assistance Equipment used: Rolling walker (2 wheeled) Transfers: Sit to/from Stand Sit to Stand: Min guard         General transfer comment: cues for technique  Ambulation/Gait Ambulation/Gait assistance: Min guard Ambulation Distance (Feet): 100 Feet Assistive device: Rolling walker (2 wheeled) Gait Pattern/deviations: Step-to pattern;Decreased step length - right;Decreased step length - left Gait velocity: decreased   General Gait Details: cues for gait sequence and posture with ambulation. Will work on step through pattern next Probation officer Rankin (Stroke Patients Only)       Balance Overall balance assessment: Needs assistance Sitting-balance support: No upper extremity supported;Feet supported Sitting balance-Leahy Scale: Good     Standing balance support: Bilateral upper extremity supported;During  functional activity Standing balance-Leahy Scale: Fair                      Cognition Arousal/Alertness: Awake/alert Behavior During Therapy: WFL for tasks assessed/performed Overall Cognitive Status: Within Functional Limits for tasks assessed                      Exercises Total Joint Exercises Quad Sets: AROM;Left;10 reps;Seated Heel Slides: 10 reps;AAROM;Left Straight Leg Raises: AAROM;Left;10 reps    General Comments        Pertinent Vitals/Pain Pain Assessment: No/denies pain Pain Score: 3  Pain Location: L knee Pain Descriptors / Indicators: Aching;Constant Pain Intervention(s): Monitored during session;Limited activity within patient's tolerance    Home Living Family/patient expects to be discharged to:: Private residence Living Arrangements: Children Available Help at Discharge: Available 24 hours/day;Family Type of Home: House Home Access: Stairs to enter Entrance Stairs-Rails: None Home Layout: One level;Able to live on main level with bedroom/bathroom Home Equipment: Bedside commode;Cane - single point;Walker - 2 wheels      Prior Function Level of Independence: Independent with assistive device(s)      Comments: amb with cane   PT Goals (current goals can now be found in the care plan section) Acute Rehab PT Goals Patient Stated Goal: to walk better in time for granddaughter's wedding in October Progress towards PT goals: Progressing toward goals    Frequency  7X/week    PT Plan Current plan remains appropriate    Co-evaluation             End of Session Equipment Utilized During  Treatment: Gait belt;Left knee immobilizer Activity Tolerance: Patient tolerated treatment well Patient left: in chair;with call bell/phone within reach;with family/visitor present     Time: 7829-5621 PT Time Calculation (min): 28 min  Charges:  $Gait Training: 8-22 mins $Therapeutic Exercise: 8-22 mins                    G Codes:       Jacqualyn Posey 12/25/2013, 11:57 AM 12/25/2013 Jacqualyn Posey PTA [phone] pager [phone] office

## 2013-12-25 NOTE — Progress Notes (Signed)
Physical Therapy Treatment Patient Details Name: Melissa Morton MRN: [id number] DOB: 1934-01-26 Today's Date: 12/25/2013    History of Present Illness Pt. is a 80 y/o female admitted s/p L TKA    PT Comments    Patient continues to make good progress with ambulation and therex. Patient able to not use KI for ambulation this session and there was no evidence of buckling with step through pattern. Patient is slightly concerned about block wearing off later but knows to stay ahead of pain management. Will practice steps in AM in prep for DC home  Follow Up Recommendations  Home health PT;Supervision/Assistance - 24 hour     Equipment Recommendations  None recommended by PT    Recommendations for Other Services       Precautions / Restrictions Precautions Precautions: Knee Required Braces or Orthoses: Knee Immobilizer - Left Knee Immobilizer - Left: On except when in CPM Restrictions LLE Weight Bearing: Weight bearing as tolerated    Mobility  Bed Mobility               General bed mobility comments: Pt in recliner  Transfers Overall transfer level: Needs assistance Equipment used: Rolling walker (2 wheeled)   Sit to Stand: Supervision         General transfer comment: Supervision for safety  Ambulation/Gait Ambulation/Gait assistance: Supervision Ambulation Distance (Feet): 300 Feet Assistive device: Rolling walker (2 wheeled) Gait Pattern/deviations: Step-through pattern;Decreased stride length Gait velocity: decreased   General Gait Details: Patient starting to ambulate with step through pattern.    Stairs            Wheelchair Mobility    Modified Rankin (Stroke Patients Only)       Balance                                    Cognition Arousal/Alertness: Awake/alert Behavior During Therapy: WFL for tasks assessed/performed Overall Cognitive Status: Within Functional Limits for tasks assessed                      Exercises Total Joint Exercises Quad Sets: AROM;Left;10 reps;Seated Heel Slides: 10 reps;Left;AROM Straight Leg Raises: Left;10 reps;AROM Long Arc Quad: AROM;Left;10 reps    General Comments        Pertinent Vitals/Pain Pain Assessment: No/denies pain    Home Living                      Prior Function            PT Goals (current goals can now be found in the care plan section) Progress towards PT goals: Progressing toward goals    Frequency  7X/week    PT Plan Current plan remains appropriate    Co-evaluation             End of Session Equipment Utilized During Treatment: Gait belt Activity Tolerance: Patient tolerated treatment well Patient left: in chair;with call bell/phone within reach;with family/visitor present     Time: 3329-5188 PT Time Calculation (min): 25 min  Charges:  $Gait Training: 8-22 mins $Therapeutic Exercise: 8-22 mins                    G Codes:      Jacqualyn Posey 12/25/2013, 3:04 PM 12/25/2013 Jacqualyn Posey PTA [phone] pager [phone] office

## 2013-12-25 NOTE — Evaluation (Signed)
Occupational Therapy Evaluation Patient Details Name: Melissa Morton MRN: [id number] DOB: Nov 23, 1933 Today's Date: 12/25/2013    History of Present Illness Pt. is a 80 y/o female admitted s/p L TKA   Clinical Impression   Pt admitted with the above diagnoses and presents with below problem list. Pt will benefit from continued acute OT to address the below listed deficits and maximize independence with basic ADLs prior to d/c home with family. PTA pt was mod I for ADLs. Pt currently performing LB ADLs at min guard level.       Follow Up Recommendations  Supervision/Assistance - 24 hour    Equipment Recommendations  Other (comment) (pt has recommended DME, discussed purchase of reacher)    Recommendations for Other Services       Precautions / Restrictions Precautions Precautions: Knee Precaution Comments: reviewed precautions  Required Braces or Orthoses: Knee Immobilizer - Left Knee Immobilizer - Left: On except when in CPM Restrictions Weight Bearing Restrictions: Yes LLE Weight Bearing: Weight bearing as tolerated      Mobility Bed Mobility               General bed mobility comments: Pt in recliner  Transfers Overall transfer level: Needs assistance Equipment used: Rolling walker (2 wheeled) Transfers: Sit to/from Stand Sit to Stand: Min guard         General transfer comment: cues for technique    Balance Overall balance assessment: Needs assistance Sitting-balance support: No upper extremity supported;Feet supported Sitting balance-Leahy Scale: Good     Standing balance support: Bilateral upper extremity supported;During functional activity Standing balance-Leahy Scale: Fair                              ADL Overall ADL's : Needs assistance/impaired Eating/Feeding: Set up;Sitting   Grooming: Set up;Standing   Upper Body Bathing: Set up;Sitting   Lower Body Bathing: Min guard;With adaptive equipment;Sit to/from stand   Upper  Body Dressing : Set up;Sitting   Lower Body Dressing: Min guard;With adaptive equipment;Sit to/from stand   Toilet Transfer: Min guard;Ambulation;RW (3n1 over toilet)   Toileting- Clothing Manipulation and Hygiene: Min guard;Sit to/from stand   Tub/ Shower Transfer: Min guard;Ambulation;3 in 1;Rolling walker   Functional mobility during ADLs: Min guard;Rolling walker General ADL Comments: Educated pt on techniques and AE as indicated for safe completion of ADLs with knee precautions     Vision                     Perception     Praxis      Pertinent Vitals/Pain Pain Assessment: 0-10 Pain Score: 3  Pain Location: L knee Pain Descriptors / Indicators: Aching;Constant Pain Intervention(s): Monitored during session;Limited activity within patient's tolerance     Hand Dominance Right   Extremity/Trunk Assessment Upper Extremity Assessment Upper Extremity Assessment: Overall WFL for tasks assessed   Lower Extremity Assessment Lower Extremity Assessment: Defer to PT evaluation   Cervical / Trunk Assessment Cervical / Trunk Assessment: Normal   Communication Communication Communication: No difficulties   Cognition Arousal/Alertness: Awake/alert Behavior During Therapy: WFL for tasks assessed/performed Overall Cognitive Status: Within Functional Limits for tasks assessed                     General Comments       Exercises       Shoulder Instructions      Home Living Family/patient expects to be  discharged to:: Private residence Living Arrangements: Children Available Help at Discharge: Available 24 hours/day;Family Type of Home: House Home Access: Stairs to enter [employer] of Steps: 1 Entrance Stairs-Rails: None Home Layout: One level;Able to live on main level with bedroom/bathroom     Bathroom Shower/Tub: Occupational psychologist: Handicapped height Bathroom Accessibility: Yes How Accessible: Accessible via  walker Home Equipment: Bedside commode;Cane - single point;Walker - 2 wheels          Prior Functioning/Environment Level of Independence: Independent with assistive device(s)        Comments: amb with cane    OT Diagnosis: Acute pain   OT Problem List: Impaired balance (sitting and/or standing);Decreased knowledge of use of DME or AE;Decreased knowledge of precautions;Pain   OT Treatment/Interventions: Self-care/ADL training;Therapeutic exercise;DME and/or AE instruction;Therapeutic activities;Patient/family education;Balance training    OT Goals(Current goals can be found in the care plan section) Acute Rehab OT Goals Patient Stated Goal: to walk better in time for granddaughter's wedding in October OT Goal Formulation: With patient Time For Goal Achievement: 01/01/14 Potential to Achieve Goals: Good ADL Goals Pt Will Perform Lower Body Bathing: with modified independence;with adaptive equipment;sit to/from stand Pt Will Perform Lower Body Dressing: with modified independence;with adaptive equipment;sit to/from stand Pt Will Transfer to Toilet: with modified independence;ambulating (3n1 over toilet) Pt Will Perform Toileting - Clothing Manipulation and hygiene: with modified independence;with adaptive equipment;sit to/from stand Pt Will Perform Tub/Shower Transfer: with modified independence;ambulating;3 in 1;rolling walker  OT Frequency: Min 2X/week   Barriers to D/C:            Co-evaluation              End of Session Equipment Utilized During Treatment: Gait belt;Rolling walker;Left knee immobilizer CPM Left Knee CPM Left Knee: Off (eating breakfast) Nurse Communication: Other (comment) (need for different 3n1)  Activity Tolerance: Patient tolerated treatment well Patient left: in chair;with call bell/phone within reach;with family/visitor present;Other (comment) (in zero knee)   Time: 2355-7322 OT Time Calculation (min): 29 min Charges:  OT General  Charges $OT Visit: 1 Procedure OT Evaluation $Initial OT Evaluation Tier I: 1 Procedure OT Treatments $Self Care/Home Management : 8-22 mins G-Codes:    Hortencia Pilar 2014/01/10, 11:09 AM

## 2013-12-25 NOTE — Progress Notes (Signed)
Orthopedic Tech Progress Note Patient Details:  Melissa Morton 05/13/1933 [id number] On cpm at 7:50 pm LLE 0-60 Patient ID: Melissa Morton, female   DOB: 12-19-1933, 80 y.o.   MRN: [id number]   Braulio Bosch 12/25/2013, 7:49 PM

## 2013-12-26 LAB — BASIC METABOLIC PANEL
ANION GAP: 11 (ref 5–15)
BUN: 31 mg/dL — ABNORMAL HIGH (ref 6–23)
CO2: 23 mEq/L (ref 19–32)
Calcium: 9.2 mg/dL (ref 8.4–10.5)
Chloride: 107 mEq/L (ref 96–112)
Creatinine, Ser: 1.3 mg/dL — ABNORMAL HIGH (ref 0.50–1.10)
GFR calc Af Amer: 44 mL/min — ABNORMAL LOW (ref >90)
GFR calc non Af Amer: 38 mL/min — ABNORMAL LOW (ref >90)
Glucose, Bld: 126 mg/dL — ABNORMAL HIGH (ref 70–99)
Potassium: 5.6 mEq/L — ABNORMAL HIGH (ref 3.7–5.3)
SODIUM: 141 meq/L (ref 137–147)

## 2013-12-26 LAB — CBC
HCT: 31.1 % — ABNORMAL LOW (ref 36.0–46.0)
Hemoglobin: 10.1 g/dL — ABNORMAL LOW (ref 12.0–15.0)
MCH: 29.4 pg (ref 26.0–34.0)
MCHC: 32.5 g/dL (ref 30.0–36.0)
MCV: 90.7 fL (ref 78.0–100.0)
PLATELETS: 190 10*3/uL (ref 150–400)
RBC: 3.43 MIL/uL — AB (ref 3.87–5.11)
RDW: 14 % (ref 11.5–15.5)
WBC: 17.7 10*3/uL — AB (ref 4.0–10.5)

## 2013-12-26 MED ORDER — OXYCODONE HCL 5 MG PO CAPS: ORAL_CAPSULE | ORAL | Status: DC

## 2013-12-26 MED ORDER — RIVAROXABAN 10 MG PO TABS: 10.0000 mg | ORAL_TABLET | Freq: Every day | ORAL | Status: DC

## 2013-12-26 MED ORDER — DSS 100 MG PO CAPS: 100.0000 mg | ORAL_CAPSULE | Freq: Two times a day (BID) | ORAL | Status: DC

## 2013-12-26 MED ORDER — POLYETHYLENE GLYCOL 3350 17 G PO PACK: 17.0000 g | PACK | Freq: Two times a day (BID) | ORAL | Status: DC

## 2013-12-26 NOTE — Discharge Summary (Signed)
Patient ID: Melissa Morton MRN: [id number] DOB/AGE: [birth year] 78 y.o.  Admit date: 12/24/2013 Discharge date: 12/26/2013  Admission Diagnoses:  Principal Problem:   Left knee DJD Active Problems:   Leucocytosis   Renal insufficiency   Gout   Breast cancer, left breast   Hx of radiation therapy   Osteoporosis   PONV (postoperative nausea and vomiting)   GERD (gastroesophageal reflux disease)   Hypertension   DJD (degenerative joint disease) of knee   UTI (urinary tract infection) Klebsiella   Discharge Diagnoses:  Same  Past Medical History  Diagnosis Date  . Reflux   . Diverticulitis   . Hypertension   . Hyperlipemia   . Shortness of breath   . GERD (gastroesophageal reflux disease)   . PONV (postoperative nausea and vomiting)     has used scop patch-that helped  . Wears glasses   . Full dentures   . Breast cancer, left breast 12/07/2011  . Breast cancer, left breast 11/24/11    left breast 4 o'clock bx=invasive ductal ca,grade II,ER/PR=+  . Renal insufficiency 11/09/2011  . Arthritis     knee and hands  . Allergy     seasonal allergies  . HOH (hard of hearing)     right ear   . Hx of radiation therapy 03/14/12- 05/02/12    left breast 5040 gray in 28 fx, site of presentation lower outer quadrant boosted to 6440 gray  . Left knee DJD     Surgeries: Procedure(s): LEFT TOTAL KNEE ARTHROPLASTY on 12/24/2013   Consultants:    Discharged Condition: Improved  Hospital Course: Melissa Morton is an 80 y.o. female who was admitted 12/24/2013 for operative treatment ofLeft knee DJD. Patient has severe unremitting pain that affects sleep, daily activities, and work/hobbies. After pre-op clearance the patient was taken to the operating room on 12/24/2013 and underwent  Procedure(s): LEFT TOTAL KNEE ARTHROPLASTY.    Patient was given perioperative antibiotics: Anti-infectives   Start     Dose/Rate Route Frequency Ordered Stop   12/25/13 1000  cefTRIAXone (ROCEPHIN) 2 g  in dextrose 5 % 50 mL IVPB     2 g 100 mL/hr over 30 Minutes Intravenous Every 24 hours 12/25/13 0822     12/24/13 1145  ceFAZolin (ANCEF) IVPB 1 g/50 mL premix     1 g 100 mL/hr over 30 Minutes Intravenous Every 6 hours 12/24/13 1144 12/24/13 1815   12/24/13 0600  ceFAZolin (ANCEF) IVPB 2 g/50 mL premix     2 g 100 mL/hr over 30 Minutes Intravenous On call to O.R. 12/23/13 1405 12/24/13 4098       Patient was given sequential compression devices, early ambulation, and chemoprophylaxis to prevent DVT.  She had a klebsiella UTI preop so IV ceftriaxone was continue throughout her hospital course.  Patient benefited maximally from hospital stay and there were no complications.    Recent vital signs: Patient Vitals for the past 24 hrs:  BP Temp Temp src Pulse Resp SpO2  12/26/13 0522 161/73 mmHg 98.1 F (36.7 C) - 85 16 97 %  12/26/13 0330 - - - - 16 99 %  12/26/13 0000 - - - - 16 98 %  12/25/13 2000 - - - - 20 97 %  12/25/13 1951 140/54 mmHg 98.4 F (36.9 C) Oral 119 20 97 %  12/25/13 1600 - - - - 20 -  12/25/13 1506 124/58 mmHg 98.5 F (36.9 C) Oral 72 - 97 %  12/25/13 1200 - - - -  20 -     Recent laboratory studies:  Recent Labs  12/25/13 0608 12/26/13 0511  WBC 20.2* 17.7*  HGB 11.6* 10.1*  HCT 34.3* 31.1*  PLT 196 190  NA 139 141  K 4.7 5.6*  CL 105 107  CO2 20 23  BUN 20 31*  CREATININE 1.22* 1.30*  GLUCOSE 141* 126*  CALCIUM 8.4 9.2     Discharge Medications:     Medication List    STOP taking these medications       alendronate 70 MG tablet  Commonly known as:  FOSAMAX     aspirin EC 81 MG tablet     traMADol 50 MG tablet  Commonly known as:  ULTRAM      TAKE these medications       albuterol (2.5 MG/3ML) 0.083% nebulizer solution  Commonly known as:  PROVENTIL  Take 2.5 mg by nebulization every 4 (four) hours as needed for wheezing.     allopurinol 300 MG tablet  Commonly known as:  ZYLOPRIM  Take 300 mg by mouth daily.     anastrozole  1 MG tablet  Commonly known as:  ARIMIDEX  Take 1 tablet (1 mg total) by mouth daily.     colchicine 0.6 MG tablet  Take 0.6 mg by mouth as needed (for gout flare).     DSS 100 MG Caps  Take 100 mg by mouth 2 (two) times daily.     losartan 100 MG tablet  Commonly known as:  COZAAR  Take 100 mg by mouth daily.     oxycodone 5 MG capsule  Commonly known as:  OXY-IR  1-2 tablets every 4-6 hrs as needed for pain     polyethylene glycol packet  Commonly known as:  MIRALAX / GLYCOLAX  Take 17 g by mouth 2 (two) times daily.     potassium chloride SA 20 MEQ tablet  Commonly known as:  K-DUR,KLOR-CON  Take 20 mEq by mouth daily.     RABEprazole 20 MG tablet  Commonly known as:  ACIPHEX  Take 20 mg by mouth daily.     rivaroxaban 10 MG Tabs tablet  Commonly known as:  XARELTO  Take 1 tablet (10 mg total) by mouth daily with breakfast.     simvastatin 40 MG tablet  Commonly known as:  ZOCOR  Take 40 mg by mouth every evening.     solifenacin 10 MG tablet  Commonly known as:  VESICARE  Take 10 mg by mouth daily.        Diagnostic Studies: Dg Chest 2 View  12/14/2013   CLINICAL DATA:  Preop for knee replacement.  EXAM: CHEST  2 VIEW  COMPARISON:  09/30/2012  FINDINGS: Cardiac silhouette is normal in size. No mediastinal or hilar masses. No evidence of adenopathy.  Lungs are mildly hyperexpanded but clear. No pleural effusion or pneumothorax.  Bony thorax is demineralized with degenerative spurring along the mid and lower thoracic spine.  IMPRESSION: No acute cardiopulmonary disease.   Electronically Signed   By: Lajean Manes M.D.   On: 12/14/2013 14:01    Disposition: 01-Home or Self Care      Discharge Instructions   CPM    Complete by:  As directed   Continuous passive motion machine (CPM):      Use the CPM from 0 to 90 for 6 hours per day.       You may break it up into 2 or 3 sessions per day.  Use CPM for 2 weeks or until you are told to stop.     Call MD /  Call 911    Complete by:  As directed   If you experience chest pain or shortness of breath, CALL 911 and be transported to the hospital emergency room.  If you develope a fever above 101 F, pus (white drainage) or increased drainage or redness at the wound, or calf pain, call your surgeon's office.     Change dressing    Complete by:  As directed   Change the dressing daily with sterile 4 x 4 inch gauze dressing and apply TED hose.  You may clean the incision with alcohol prior to redressing.     Constipation Prevention    Complete by:  As directed   Drink plenty of fluids.  Prune juice may be helpful.  You may use a stool softener, such as Colace (over the counter) 100 mg twice a day.  Use MiraLax (over the counter) for constipation as needed.     Diet - low sodium heart healthy    Complete by:  As directed      Discharge instructions    Complete by:  As directed   Total Knee Replacement Care After Refer to this sheet in the next few weeks. These discharge instructions provide you with general information on caring for yourself after you leave the hospital. Your caregiver may also give you specific instructions. Your treatment has been planned according to the most current medical practices available, but unavoidable complications sometimes occur. If you have any problems or questions after discharge, please call your caregiver. Regaining a near full range of motion of your knee within the first 3 to 6 weeks after surgery is critical. [place] may resume a normal diet and activities as directed.  Perform exercises as directed.  Place gray foam block, curve side up under heel at all times except when in CPM or when walking.  DO NOT modify, tear, cut, or change in any way the gray foam block. You will receive physical therapy daily  Take showers instead of baths until informed otherwise.  You may shower on Sunday.  Please wash whole leg including wound with soap and water   Change bandages (dressings)daily It is OK to take over-the-counter tylenol in addition to the oxycodone for pain, discomfort, or fever. Oxycodone is VERY constipating.  Please take stool softener twice a day and laxatives daily until bowels are regular Eat a well-balanced diet.  Avoid lifting or driving until you are instructed otherwise.  Make an appointment to see your caregiver for stitches (suture) or staple removal as directed.  If you have been sent home with a continuous passive motion machine (CPM machine), 0-90 degrees 6 hrs a day   2 hrs a shift SEEK MEDICAL CARE IF: You have swelling of your calf or leg.  You develop shortness of breath or chest pain.  You have redness, swelling, or increasing pain in the wound.  There is pus or any unusual drainage coming from the surgical site.  You notice a bad smell coming from the surgical site or dressing.  The surgical site breaks open after sutures or staples have been removed.  There is persistent bleeding from the suture or staple line.  You are getting worse or are not improving.  You have any other questions or concerns.  SEEK IMMEDIATE MEDICAL CARE IF:  You have a fever.  You develop a rash.  You have difficulty breathing.  You develop any reaction or side effects to medicines given.  Your knee motion is decreasing rather than improving.  MAKE SURE YOU:  Understand these instructions.  Will watch your condition.  Will get help right away if you are not doing well or get worse.     Do not put a pillow under the knee. Place it under the heel.    Complete by:  As directed   Place gray foam block, curve side up under heel at all times except when in CPM or when walking.  DO NOT modify, tear, cut, or change in any way the gray foam block.     Increase activity slowly as tolerated    Complete by:  As directed      TED hose    Complete by:  As directed   Use stockings (TED hose) for 2 weeks on both leg(s).  You may remove them at  night for sleeping.           Follow-up Information   Follow up with [facility]. (Someone from [facility] will contact you concerning start date and time for physical therapy.)    Contact information:   [address] [place] [address] [phone]       Follow up with Lorn Junes, MD On 01/08/2014. (appt time 9:45 am)    Specialty:  Orthopedic Surgery   Contact information:   [address] [place] [address] [phone]        Signed: Linda Hedges 12/26/2013, 7:38 AM

## 2013-12-26 NOTE — Progress Notes (Signed)
Occupational Therapy Treatment Patient Details Name: Melissa Morton MRN: [id number] DOB: Sep 14, 1933 Today's Date: 12/26/2013    History of present illness Pt. is a 80 y/o female admitted s/p L TKA   OT comments  Pt seen for acute OT treatment session. Pt donned LB dressing at supervision level in sit>stand with no AE. Pt ambulated short distance in hall min guard. Reviewed ADL education.  Follow Up Recommendations  Supervision/Assistance - 24 hour    Equipment Recommendations  Other (comment) (pt has recommended DME)    Recommendations for Other Services      Precautions / Restrictions Precautions Precautions: Knee Precaution Comments: reviewed precautions  Restrictions Weight Bearing Restrictions: Yes LLE Weight Bearing: Weight bearing as tolerated       Mobility Bed Mobility   Bed Mobility: Supine to Sit              Transfers Overall transfer level: Needs assistance Equipment used: Rolling walker (2 wheeled) Transfers: Sit to/from Stand Sit to Stand: Supervision         General transfer comment: supervision for safety    Balance                                   ADL Overall ADL's : Needs assistance/impaired                     Lower Body Dressing: Supervision/safety;Sit to/from stand Lower Body Dressing Details (indicate cue type and reason): able to don LB dressing while mainatining knee precautions. No AE needed. Toilet Transfer: Supervision/safety;Ambulation;RW   Toileting- Water quality scientist and Hygiene: Min guard;Sit to/from stand       Functional mobility during ADLs: Min guard;Rolling walker General ADL Comments: no AE needed to don LB dressing. Reviewed ADL education.      Vision                     Perception     Praxis      Cognition   Behavior During Therapy: [facility] for tasks assessed/performed Overall Cognitive Status: Within Functional Limits for tasks assessed                        Extremity/Trunk Assessment               Exercises Total Joint Exercises Quad Sets: AROM;Left;10 reps;Seated Straight Leg Raises: Left;10 reps;AROM Long Arc Quad: AROM;Left;10 reps   Shoulder Instructions       General Comments      Pertinent Vitals/ Pain       Pain Assessment: 0-10 Pain Score: 6  Pain Location: posterior L knee Pain Descriptors / Indicators: Aching;Constant Pain Intervention(s): Limited activity within patient's tolerance;Monitored during session;Premedicated before session  Home Living                                          Prior Functioning/Environment              Frequency Min 2X/week     Progress Toward Goals  OT Goals(current goals can now be found in the care plan section)  Progress towards OT goals: Progressing toward goals  Acute Rehab OT Goals Patient Stated Goal: to walk better in time for granddaughter's wedding in October OT Goal Formulation: With patient Time For  Goal Achievement: 01/01/14 Potential to Achieve Goals: Good ADL Goals Pt Will Perform Lower Body Bathing: with modified independence;with adaptive equipment;sit to/from stand Pt Will Perform Lower Body Dressing: with modified independence;with adaptive equipment;sit to/from stand Pt Will Transfer to Toilet: with modified independence;ambulating Pt Will Perform Toileting - Clothing Manipulation and hygiene: with modified independence;with adaptive equipment;sit to/from stand Pt Will Perform Tub/Shower Transfer: with modified independence;ambulating;3 in 1;rolling walker  Plan Discharge plan remains appropriate    Co-evaluation                 End of Session Equipment Utilized During Treatment: Rolling walker   Activity Tolerance Patient tolerated treatment well   Patient Left in chair;with call bell/phone within reach;Other (comment) (in zero knee)   Nurse Communication          Time: 3335-4562 OT Time Calculation (min): 26  min  Charges: OT General Charges $OT Visit: 1 Procedure OT Treatments $Self Care/Home Management : 23-37 mins  Hortencia Pilar 12/26/2013, 1:45 PM

## 2013-12-26 NOTE — Progress Notes (Signed)
Physical Therapy Treatment Patient Details Name: NAFISA OLDS MRN: [id number] DOB: Sep 11, 1933 Today's Date: 12/26/2013    History of Present Illness Pt. is a 80 y/o female admitted s/p L TKA    PT Comments    Patient continues to make great progress. Able to practice steps this AM. Planning to DC later today  Follow Up Recommendations  Home health PT;Supervision/Assistance - 24 hour     Equipment Recommendations  None recommended by PT    Recommendations for Other Services       Precautions / Restrictions Precautions Precautions: Knee Restrictions Weight Bearing Restrictions: Yes LLE Weight Bearing: Weight bearing as tolerated    Mobility  Bed Mobility   Bed Mobility: Supine to Sit              Transfers Overall transfer level: Modified independent               General transfer comment: Supervision for safety  Ambulation/Gait Ambulation/Gait assistance: Modified independent (Device/Increase time) Ambulation Distance (Feet): 300 Feet Assistive device: Rolling walker (2 wheeled) Gait Pattern/deviations: Step-through pattern Gait velocity: decreased   General Gait Details: Good safe technique   Stairs Stairs: Yes Stairs assistance: Min guard Stair Management: No rails;Step to pattern;Forwards;With walker Number of Stairs: 2 General stair comments: patient practiced one step x2 with cues for technique  Wheelchair Mobility    Modified Rankin (Stroke Patients Only)       Balance                                    Cognition Arousal/Alertness: Awake/alert Behavior During Therapy: WFL for tasks assessed/performed Overall Cognitive Status: Within Functional Limits for tasks assessed                      Exercises Total Joint Exercises Quad Sets: AROM;Left;10 reps;Seated Straight Leg Raises: Left;10 reps;AROM Long Arc Quad: AROM;Left;10 reps    General Comments        Pertinent Vitals/Pain Pain Assessment:  No/denies pain Pain Location: L knee Pain Descriptors / Indicators: Aching;Constant Pain Intervention(s): Monitored during session;Limited activity within patient's tolerance    Home Living                      Prior Function            PT Goals (current goals can now be found in the care plan section) Progress towards PT goals: Progressing toward goals    Frequency  7X/week    PT Plan Current plan remains appropriate    Co-evaluation             End of Session Equipment Utilized During Treatment: Gait belt Activity Tolerance: Patient tolerated treatment well Patient left: in chair;with call bell/phone within reach;with family/visitor present     Time: 9449-6759 PT Time Calculation (min): 24 min  Charges:  $Gait Training: 8-22 mins $Therapeutic Exercise: 8-22 mins                    G Codes:      Jacqualyn Posey 12/26/2013, 11:52 AM 12/26/2013 Jacqualyn Posey PTA [phone] pager [phone] office

## 2014-02-22 ENCOUNTER — Other Ambulatory Visit: Payer: Self-pay | Admitting: Internal Medicine

## 2014-02-22 DIAGNOSIS — Z853 Personal history of malignant neoplasm of breast: Secondary | ICD-10-CM

## 2014-02-28 ENCOUNTER — Other Ambulatory Visit: Payer: Self-pay | Admitting: Obstetrics and Gynecology

## 2014-02-28 DIAGNOSIS — Z853 Personal history of malignant neoplasm of breast: Secondary | ICD-10-CM

## 2014-03-06 ENCOUNTER — Ambulatory Visit
Admission: RE | Admit: 2014-03-06 | Discharge: 2014-03-06 | Disposition: A | Discharge status: Home / Self Care | Payer: Medicare Other | Source: Ambulatory Visit | Attending: Internal Medicine | Admitting: Internal Medicine

## 2014-03-06 DIAGNOSIS — Z853 Personal history of malignant neoplasm of breast: Secondary | ICD-10-CM

## 2014-03-20 ENCOUNTER — Telehealth: Payer: Self-pay | Admitting: Hematology

## 2014-03-21 ENCOUNTER — Other Ambulatory Visit: Payer: Medicare Other

## 2014-03-21 ENCOUNTER — Ambulatory Visit: Payer: Medicare Other

## 2014-03-25 IMAGING — CR DG KNEE COMPLETE 4+V*R*
4 series · 4 of 4 positions shown · non-contrast
Comparison: 03/12/2004

CLINICAL DATA: Fall.  Pain.

RIGHT KNEE - COMPLETE 4+ VIEW

[x knee ap right (1 of 3)]
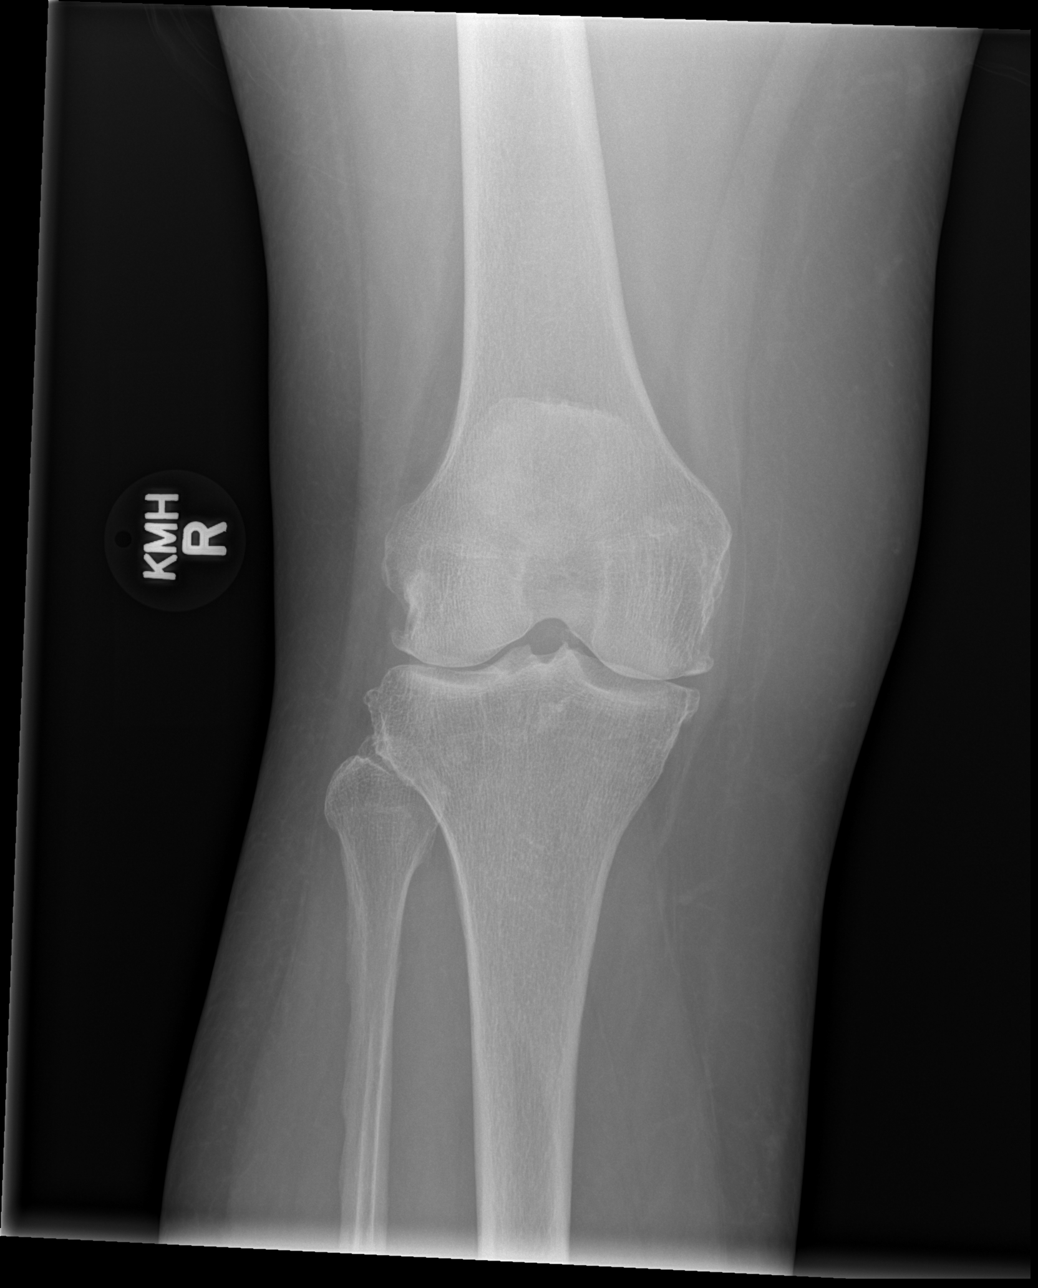

[x knee ap right (2 of 3)]
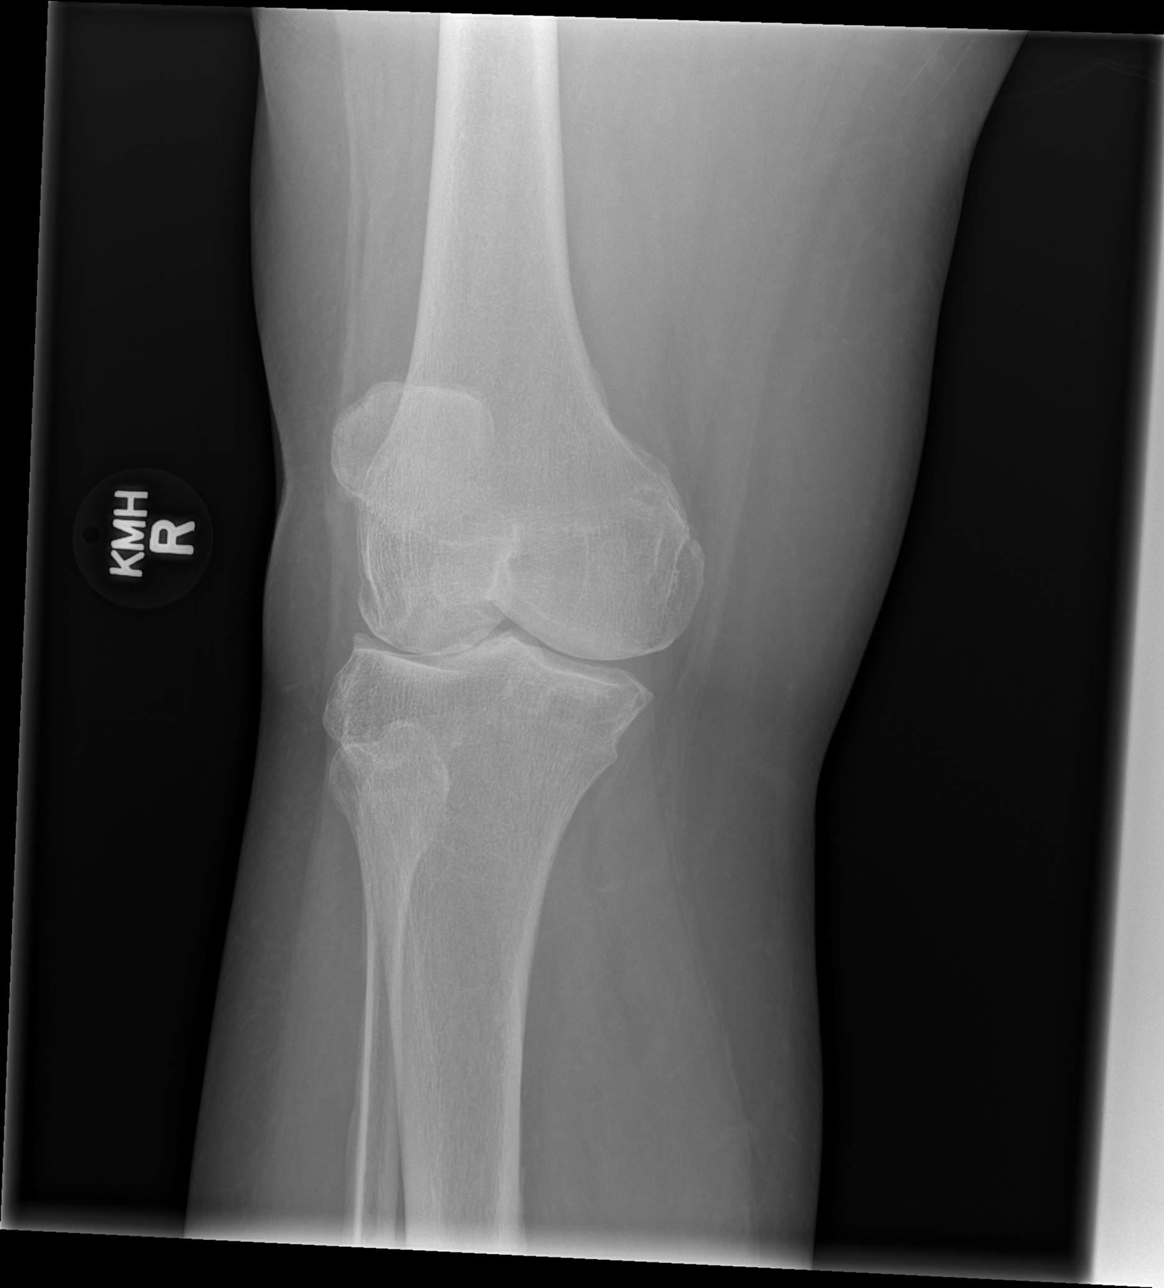

[x knee ap right (3 of 3)]
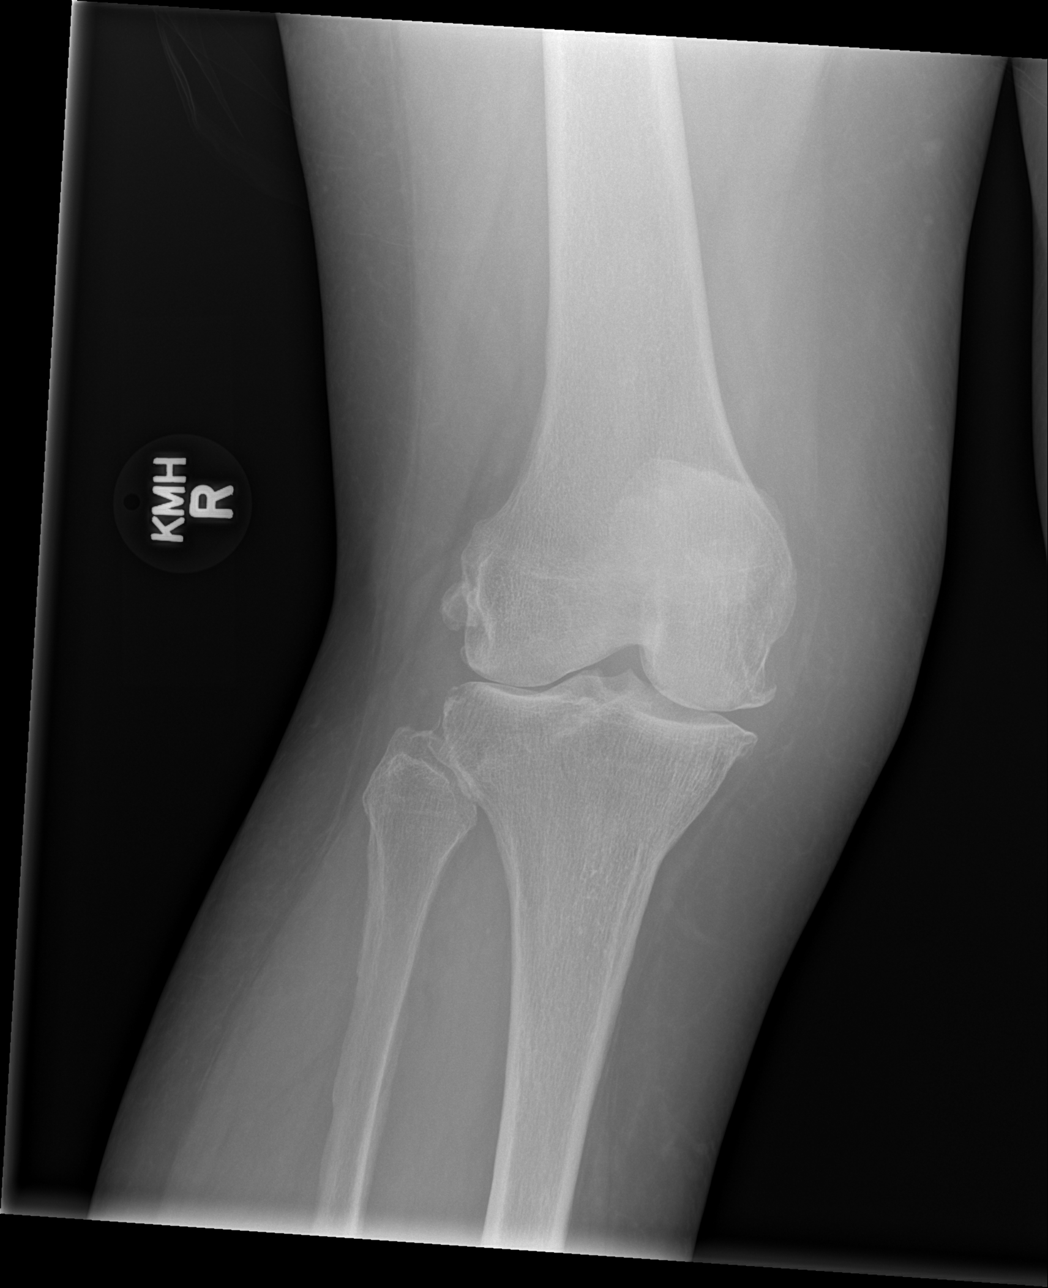

[x knee lat right]
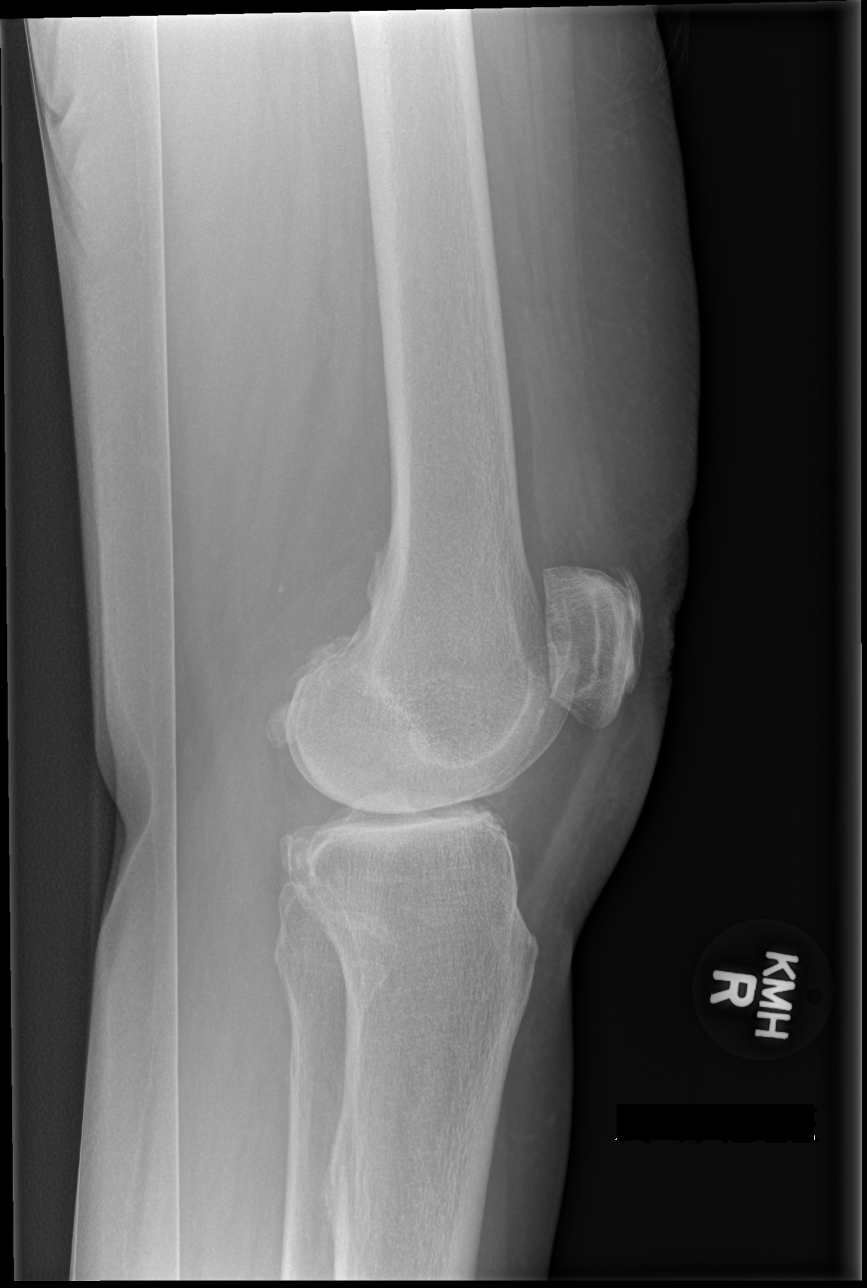

[4 of 4 positions shown; findings below may reference images not displayed]

FINDINGS: There is a small knee joint effusion.  There is
tricompartmental osteoarthritis with joint space narrowing and
marginal osteophytes.  No evidence of fracture or other focal
lesion.
IMPRESSION: Small effusion.  Tricompartmental osteoarthritis.  No identifiably
acute finding.

## 2014-04-03 ENCOUNTER — Other Ambulatory Visit: Payer: Self-pay | Admitting: *Deleted

## 2014-04-03 DIAGNOSIS — C50912 Malignant neoplasm of unspecified site of left female breast: Secondary | ICD-10-CM

## 2014-04-03 DIAGNOSIS — D72829 Elevated white blood cell count, unspecified: Secondary | ICD-10-CM

## 2014-04-04 ENCOUNTER — Other Ambulatory Visit: Payer: Self-pay | Admitting: *Deleted

## 2014-04-04 ENCOUNTER — Telehealth: Payer: Self-pay | Admitting: *Deleted

## 2014-04-04 ENCOUNTER — Encounter: Payer: Self-pay | Admitting: Hematology

## 2014-04-04 ENCOUNTER — Telehealth: Payer: Self-pay | Admitting: Hematology

## 2014-04-04 ENCOUNTER — Other Ambulatory Visit (HOSPITAL_BASED_OUTPATIENT_CLINIC_OR_DEPARTMENT_OTHER): Payer: Medicare Other

## 2014-04-04 ENCOUNTER — Ambulatory Visit (HOSPITAL_BASED_OUTPATIENT_CLINIC_OR_DEPARTMENT_OTHER): Payer: Medicare Other | Admitting: Hematology

## 2014-04-04 VITALS — BP 193/85 | HR 73 | Temp 97.6°F | Resp 20 | Ht 64.0 in | Wt 163.1 lb

## 2014-04-04 DIAGNOSIS — C50912 Malignant neoplasm of unspecified site of left female breast: Secondary | ICD-10-CM

## 2014-04-04 DIAGNOSIS — Z853 Personal history of malignant neoplasm of breast: Secondary | ICD-10-CM

## 2014-04-04 DIAGNOSIS — Z170 Estrogen receptor positive status [ER+]: Secondary | ICD-10-CM

## 2014-04-04 DIAGNOSIS — D72829 Elevated white blood cell count, unspecified: Secondary | ICD-10-CM

## 2014-04-04 LAB — CBC WITH DIFFERENTIAL/PLATELET
BASO%: 0.6 % (ref 0.0–2.0)
BASOS ABS: 0.1 10*3/uL (ref 0.0–0.1)
EOS%: 1.8 % (ref 0.0–7.0)
Eosinophils Absolute: 0.1 10*3/uL (ref 0.0–0.5)
HEMATOCRIT: 40.2 % (ref 34.8–46.6)
HGB: 12.9 g/dL (ref 11.6–15.9)
LYMPH#: 2.3 10*3/uL (ref 0.9–3.3)
LYMPH%: 30.2 % (ref 14.0–49.7)
MCH: 29.7 pg (ref 25.1–34.0)
MCHC: 32.1 g/dL (ref 31.5–36.0)
MCV: 92.6 fL (ref 79.5–101.0)
MONO#: 0.5 10*3/uL (ref 0.1–0.9)
MONO%: 6.1 % (ref 0.0–14.0)
NEUT%: 61.3 % (ref 38.4–76.8)
NEUTROS ABS: 4.8 10*3/uL (ref 1.5–6.5)
PLATELETS: 282 10*3/uL (ref 145–400)
RBC: 4.34 10*6/uL (ref 3.70–5.45)
RDW: 14.2 % (ref 11.2–14.5)
WBC: 7.8 10*3/uL (ref 3.9–10.3)

## 2014-04-04 LAB — COMPREHENSIVE METABOLIC PANEL (CC13)
ALT: 11 U/L (ref 0–55)
ANION GAP: 10 meq/L (ref 3–11)
AST: 15 U/L (ref 5–34)
Albumin: 3.9 g/dL (ref 3.5–5.0)
Alkaline Phosphatase: 82 U/L (ref 40–150)
BUN: 20.8 mg/dL (ref 7.0–26.0)
CHLORIDE: 109 meq/L (ref 98–109)
CO2: 24 mEq/L (ref 22–29)
CREATININE: 1.4 mg/dL — AB (ref 0.6–1.1)
Calcium: 9.8 mg/dL (ref 8.4–10.4)
EGFR: 37 mL/min/{1.73_m2} — ABNORMAL LOW (ref >90)
Glucose: 92 mg/dl (ref 70–140)
POTASSIUM: 4.1 meq/L (ref 3.5–5.1)
SODIUM: 143 meq/L (ref 136–145)
Total Bilirubin: 0.46 mg/dL (ref 0.20–1.20)
Total Protein: 6.6 g/dL (ref 6.4–8.3)

## 2014-04-04 LAB — LACTATE DEHYDROGENASE (CC13): LDH: 203 U/L (ref 125–245)

## 2014-04-04 MED ORDER — ANASTROZOLE 1 MG PO TABS
1.0000 mg | ORAL_TABLET | Freq: Every day | ORAL | Status: DC
Start: 2014-04-04 — End: 2014-04-12

## 2014-04-04 NOTE — Progress Notes (Signed)
[facility] OFFICE PROGRESS NOTE  [name], MD [address] [place] [address]  DIAGNOSIS: History of breast cancer   Breast cancer, left breast   Staging form: Breast, AJCC 7th Edition     Clinical stage from 12/07/2011: Stage IA (T1, N0, cM0) - Signed by Haywood Lasso, MD on 12/07/2011       Prognostic indicators: ER = 100%  PR = 100%  Ki67 = 12%  Her 2 equivocal on BX, 1.26 (no amp) on lumpectomy      Pathologic: Stage IA (T1c, N0, cM0) - Signed by Haywood Lasso, MD on 01/24/2012       Prognostic indicators: ER = 100%  PR = 100%  Ki67 = 12%  Her 2 equivocal on BX, 1.26 (no amp) on lumpectomy       Breast cancer, left breast   12/07/2011 Initial Diagnosis Breast cancer, left breast   01/02/2012 Surgery lumpectomy     - 05/02/2012 Radiation Therapy 35 radiation treatments under the direction of Dr. Sondra Come that was completed on 05/02/2012.     Chief complain: follow up   CURRENT THERAPY: Arimidex 1 mg by mouth daily for an anticipated 5 years. Arimidex was started on 05/06/2012.  INTERVAL HISTORY: Pt returns for follow up. She is doing well, no new complains. She remains active, chronic left knee pain unchanged. She is tolerating Arimidex well without significant issues.  MEDICAL HISTORY: Past Medical History  Diagnosis Date  . Reflux   . Diverticulitis   . Hypertension   . Hyperlipemia   . Shortness of breath   . GERD (gastroesophageal reflux disease)   . PONV (postoperative nausea and vomiting)     has used scop patch-that helped  . Wears glasses   . Full dentures   . Breast cancer, left breast 12/07/2011  . Breast cancer, left breast 11/24/11    left breast 4 o'clock bx=invasive ductal ca,grade II,ER/PR=+  . Renal insufficiency 11/09/2011  . Arthritis     knee and hands  . Allergy     seasonal allergies  . HOH (hard of hearing)     right ear   . Hx of radiation therapy 03/14/12- 05/02/12    left breast 5040 gray in 28 fx,  site of presentation lower outer quadrant boosted to 6440 gray  . Left knee DJD     INTERIM HISTORY: has Leucocytosis; Renal insufficiency; Gout; Breast cancer, left breast; Hx of radiation therapy; Osteoporosis; PONV (postoperative nausea and vomiting); GERD (gastroesophageal reflux disease); Hypertension; Left knee DJD; DJD (degenerative joint disease) of knee; and UTI (urinary tract infection) Klebsiella on her problem list.    ALLERGIES:  is allergic to hydromorphone hcl; nsaids; and prednisone.  MEDICATIONS: has a current medication list which includes the following prescription(s): acetaminophen, albuterol, allopurinol, anastrozole, colchicine, diphenhydramine-acetaminophen, losartan, polycarbophil, simvastatin, solifenacin, potassium chloride sa, and rabeprazole.  SURGICAL HISTORY:  Past Surgical History  Procedure Laterality Date  . Knee surgery  2000    rt   . Cholecystectomy    . Tubal ligation    . Bladder suspension    . Tonsillectomy    . Fracture surgery      lt foot  . Eye surgery      both cataracts  . Breast lumpectomy  01/04/2012    left,invasive ductcl ca, grade II,lloq, lymphovascular and perineural invaion ,DCIS,  . Abdominal hysterectomy  93    total  . Total knee arthroplasty Left 12/24/2013  Procedure: LEFT TOTAL KNEE ARTHROPLASTY;  Surgeon: Lorn Junes, MD;  Location: [facility];  Service: Orthopedics;  Laterality: Left;   PROBLEM LIST:  1. Stage I, T1c N0 M0, 1.3 cm. invasive ductal carcinoma in situ, grade 2. The tumor was ER 100%, PR 100% positive, HER- 2/neu negative. HER-2/neu was negative by FISH. Ki67 proliferation marker was 14%. There was evidence of lymphovascular and perineural invasion. The tumor was 2 mm from the nearest margin inferiorly. None of the 3 lymph nodes sampled had evidence of tumor. She underwent a lumpectomy with sentinel node dissection on 01/04/2012. Radiation treatments to the left breast were administered by Dr. Gery Pray from  03/14/2012 to 05/02/2012. The patient received 5040 cGy in 28 fractions plus a boost, for a total of 6440 cGy in 35 fractions. Genetic testing was carried out with the BreastNext gene panel through [employer]. No cancer causing mutations were detected in any of the 18 genes tested. She began taking Arimidex 1 mg PO daily on 05/06/2012.  2. Overactive bladder  3. Gout  4. Hyperlipidemia  5. 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 HTN  7. Osteopenia  8. Arthritis  9. Impaired renal functioning  10. GERD  11. Sigmoid diverticulosis requiring admission in August 2012.  REVIEW OF SYSTEMS:   Constitutional: Denies fevers, chills or abnormal weight loss Eyes: Denies blurriness of vision Ears, nose, mouth, throat, and face: Denies mucositis or sore throat Respiratory: Denies cough, dyspnea or wheezes Cardiovascular: Denies palpitation, chest discomfort or lower extremity swelling Gastrointestinal:  Denies nausea, heartburn or change in bowel habits Skin: Denies abnormal skin rashes Lymphatics: Denies new lymphadenopathy or easy bruising Neurological:Denies numbness, tingling or new weaknesses Behavioral/Psych: Mood is stable, no new changes  All other systems were reviewed with the patient and are negative.  PHYSICAL EXAMINATION: ECOG PERFORMANCE STATUS: 0 - Asymptomatic  Blood pressure 193/85, pulse 73, temperature 97.6 F (36.4 C), temperature source Oral, resp. rate 20, height 5' 4" (1.626 m), weight 163 lb 1.6 oz (73.982 kg).  GENERAL:alert, no distress and comfortable elderly female who is easily mobile.  SKIN: skin color, texture, turgor are normal, no rashes or significant lesions EYES: normal, Conjunctiva are pink and non-injected, sclera clear OROPHARYNX:no exudate, no erythema and lips, buccal mucosa, and tongue normal  NECK: supple, thyroid normal size, non-tender, without nodularity LYMPH:  no palpable lymphadenopathy in the cervical, axillary or supraclavicular LUNGS: clear to  auscultation and percussion with normal breathing effort HEART: regular rate & rhythm and no murmurs and no lower extremity edema BREASTS: Right breast is normal; left breast slightly smaller than the right breast.  There were no suspicious findings or masses or dimpling or nipple inversion or induration.  ABDOMEN:abdomen soft, non-tender and normal bowel sounds Musculoskeletal:no cyanosis of digits and no clubbing  NEURO: alert & oriented x 3 with fluent speech, no focal motor/sensory deficits  LABORATORY DATA: Results for orders placed or performed in visit on 04/04/14 (from the past 48 hour(s))  CBC with Differential     Status: None   Collection Time: 04/04/14  9:37 AM  Result Value Ref Range   WBC 7.8 3.9 - 10.3 10e3/uL   NEUT# 4.8 1.5 - 6.5 10e3/uL   HGB 12.9 11.6 - 15.9 g/dL   HCT 40.2 34.8 - 46.6 %   Platelets 282 145 - 400 10e3/uL   MCV 92.6 79.5 - 101.0 fL   MCH 29.7 25.1 - 34.0 pg   MCHC 32.1 31.5 - 36.0 g/dL  RBC 4.34 3.70 - 5.45 10e6/uL   RDW 14.2 11.2 - 14.5 %   lymph# 2.3 0.9 - 3.3 10e3/uL   MONO# 0.5 0.1 - 0.9 10e3/uL   Eosinophils Absolute 0.1 0.0 - 0.5 10e3/uL   Basophils Absolute 0.1 0.0 - 0.1 10e3/uL   NEUT% 61.3 38.4 - 76.8 %   LYMPH% 30.2 14.0 - 49.7 %   MONO% 6.1 0.0 - 14.0 %   EOS% 1.8 0.0 - 7.0 %   BASO% 0.6 0.0 - 2.0 %    Labs:  Lab Results  Component Value Date   WBC 7.8 04/04/2014   HGB 12.9 04/04/2014   HCT 40.2 04/04/2014   MCV 92.6 04/04/2014   PLT 282 04/04/2014   NEUTROABS 4.8 04/04/2014      Chemistry      Component Value Date/Time   NA 141 12/26/2013 0511   NA 146* 09/18/2013 0936   K 5.6* 12/26/2013 0511   K 3.6 09/18/2013 0936   CL 107 12/26/2013 0511   CL 106 09/26/2012 1418   CO2 23 12/26/2013 0511   CO2 25 09/18/2013 0936   BUN 31* 12/26/2013 0511   BUN 21.9 09/18/2013 0936   CREATININE 1.30* 12/26/2013 0511   CREATININE 1.4* 09/18/2013 0936      Component Value Date/Time   CALCIUM 9.2 12/26/2013 0511   CALCIUM  10.8* 09/18/2013 0936   ALKPHOS 92 12/14/2013 1351   ALKPHOS 76 09/18/2013 0936   AST 19 12/14/2013 1351   AST 14 09/18/2013 0936   ALT 9 12/14/2013 1351   ALT 12 09/18/2013 0936   BILITOT 0.4 12/14/2013 1351   BILITOT 0.52 09/18/2013 0936       RADIOGRAPHIC STUDIES: Mammogram 03/06/2014 No evidence of breast malignancy.   ASSESSMENT: FELISHA CLAYTOR 80 y.o. female with a history of History of breast cancer  PLAN:  1. Breast cancer.  --T1c invasive ductal carcinoma of the left breast, hormone receptor positive, HER-2/neu negative with negative sentinel lymph nodes status post lumpectomy and radiation treatments. --Continue Arimidex 1 mg by mouth daily.  This was started on 05/06/2012, plan for a total of 5 years  --recent screening Mammogram negative.   2. Bone health  --she had bone density scan early this week. She will let me know the result. -calcium has been held due to hypercalciemia in may 2015. Today's result pending   4. Follow-up. We will obtain CBC, Chemistries in six months doing her next follow up appointment. All questions were answered. The patient knows to call the clinic with any problems, questions or concerns. We can certainly see the patient much sooner if necessary.  I spent 10 minutes counseling the patient face to face. The total time spent in the appointment was 15 minutes.    Truitt Merle, MD 04/04/2014 10:52 AM

## 2014-04-04 NOTE — Telephone Encounter (Signed)
Notified pt per Dr Burr Medico that she can resume her calcium & to keep a watch on her BP & see her PCP if it cont to be up. Informed of slightly elevated creat also.

## 2014-04-04 NOTE — Telephone Encounter (Signed)
gv adn printed appt sched and avs for pt for June 2016 °

## 2014-04-08 ENCOUNTER — Other Ambulatory Visit: Payer: Self-pay | Admitting: Internal Medicine

## 2014-04-12 ENCOUNTER — Other Ambulatory Visit: Payer: Self-pay | Admitting: Hematology

## 2014-04-12 DIAGNOSIS — C50919 Malignant neoplasm of unspecified site of unspecified female breast: Secondary | ICD-10-CM

## 2014-08-05 ENCOUNTER — Encounter (HOSPITAL_COMMUNITY): Payer: Self-pay | Admitting: Emergency Medicine

## 2014-08-05 ENCOUNTER — Emergency Department (HOSPITAL_COMMUNITY)
Admission: EM | Admit: 2014-08-05 | Discharge: 2014-08-05 | Disposition: A | Discharge status: Home / Self Care | Payer: Medicare Other | Attending: Emergency Medicine | Admitting: Emergency Medicine

## 2014-08-05 ENCOUNTER — Emergency Department (HOSPITAL_COMMUNITY): Payer: Medicare Other

## 2014-08-05 DIAGNOSIS — Z923 Personal history of irradiation: Secondary | ICD-10-CM | POA: Diagnosis not present

## 2014-08-05 DIAGNOSIS — H9190 Unspecified hearing loss, unspecified ear: Secondary | ICD-10-CM | POA: Diagnosis not present

## 2014-08-05 DIAGNOSIS — K529 Noninfective gastroenteritis and colitis, unspecified: Secondary | ICD-10-CM

## 2014-08-05 DIAGNOSIS — K5289 Other specified noninfective gastroenteritis and colitis: Secondary | ICD-10-CM | POA: Insufficient documentation

## 2014-08-05 DIAGNOSIS — Z79899 Other long term (current) drug therapy: Secondary | ICD-10-CM | POA: Diagnosis not present

## 2014-08-05 DIAGNOSIS — I10 Essential (primary) hypertension: Secondary | ICD-10-CM | POA: Insufficient documentation

## 2014-08-05 DIAGNOSIS — R109 Unspecified abdominal pain: Secondary | ICD-10-CM

## 2014-08-05 DIAGNOSIS — R1032 Left lower quadrant pain: Secondary | ICD-10-CM | POA: Diagnosis present

## 2014-08-05 DIAGNOSIS — Z9049 Acquired absence of other specified parts of digestive tract: Secondary | ICD-10-CM | POA: Insufficient documentation

## 2014-08-05 DIAGNOSIS — Z87448 Personal history of other diseases of urinary system: Secondary | ICD-10-CM | POA: Diagnosis not present

## 2014-08-05 DIAGNOSIS — Z853 Personal history of malignant neoplasm of breast: Secondary | ICD-10-CM | POA: Insufficient documentation

## 2014-08-05 DIAGNOSIS — Z87891 Personal history of nicotine dependence: Secondary | ICD-10-CM | POA: Diagnosis not present

## 2014-08-05 DIAGNOSIS — M1990 Unspecified osteoarthritis, unspecified site: Secondary | ICD-10-CM | POA: Insufficient documentation

## 2014-08-05 DIAGNOSIS — Z9851 Tubal ligation status: Secondary | ICD-10-CM | POA: Insufficient documentation

## 2014-08-05 DIAGNOSIS — K219 Gastro-esophageal reflux disease without esophagitis: Secondary | ICD-10-CM | POA: Insufficient documentation

## 2014-08-05 DIAGNOSIS — E785 Hyperlipidemia, unspecified: Secondary | ICD-10-CM | POA: Insufficient documentation

## 2014-08-05 DIAGNOSIS — Z792 Long term (current) use of antibiotics: Secondary | ICD-10-CM | POA: Diagnosis not present

## 2014-08-05 DIAGNOSIS — Z90710 Acquired absence of both cervix and uterus: Secondary | ICD-10-CM | POA: Insufficient documentation

## 2014-08-05 LAB — CBC WITH DIFFERENTIAL/PLATELET
Basophils Absolute: 0 10*3/uL (ref 0.0–0.1)
Basophils Relative: 0 % (ref 0–1)
Eosinophils Absolute: 0.1 10*3/uL (ref 0.0–0.7)
Eosinophils Relative: 1 % (ref 0–5)
HCT: 42.2 % (ref 36.0–46.0)
HEMOGLOBIN: 14.1 g/dL (ref 12.0–15.0)
LYMPHS PCT: 20 % (ref 12–46)
Lymphs Abs: 2.2 10*3/uL (ref 0.7–4.0)
MCH: 30.7 pg (ref 26.0–34.0)
MCHC: 33.4 g/dL (ref 30.0–36.0)
MCV: 91.9 fL (ref 78.0–100.0)
MONO ABS: 0.7 10*3/uL (ref 0.1–1.0)
Monocytes Relative: 6 % (ref 3–12)
NEUTROS ABS: 8.1 10*3/uL — AB (ref 1.7–7.7)
Neutrophils Relative %: 73 % (ref 43–77)
PLATELETS: 264 10*3/uL (ref 150–400)
RBC: 4.59 MIL/uL (ref 3.87–5.11)
RDW: 14.2 % (ref 11.5–15.5)
WBC: 11.1 10*3/uL — AB (ref 4.0–10.5)

## 2014-08-05 LAB — COMPREHENSIVE METABOLIC PANEL
ALBUMIN: 4.8 g/dL (ref 3.5–5.2)
ALK PHOS: 66 U/L (ref 39–117)
ALT: 14 U/L (ref 0–35)
AST: 18 U/L (ref 0–37)
Anion gap: 13 (ref 5–15)
BILIRUBIN TOTAL: 0.7 mg/dL (ref 0.3–1.2)
BUN: 28 mg/dL — ABNORMAL HIGH (ref 6–23)
CO2: 22 mmol/L (ref 19–32)
CREATININE: 1.66 mg/dL — AB (ref 0.50–1.10)
Calcium: 9.8 mg/dL (ref 8.4–10.5)
Chloride: 102 mmol/L (ref 96–112)
GFR calc Af Amer: 33 mL/min — ABNORMAL LOW (ref >90)
GFR, EST NON AFRICAN AMERICAN: 28 mL/min — AB (ref >90)
Glucose, Bld: 114 mg/dL — ABNORMAL HIGH (ref 70–99)
POTASSIUM: 3.7 mmol/L (ref 3.5–5.1)
SODIUM: 137 mmol/L (ref 135–145)
Total Protein: 7.5 g/dL (ref 6.0–8.3)

## 2014-08-05 LAB — URINALYSIS, ROUTINE W REFLEX MICROSCOPIC
Glucose, UA: NEGATIVE mg/dL
HGB URINE DIPSTICK: NEGATIVE
KETONES UR: NEGATIVE mg/dL
NITRITE: NEGATIVE
PROTEIN: NEGATIVE mg/dL
Specific Gravity, Urine: 1.023 (ref 1.005–1.030)
UROBILINOGEN UA: 1 mg/dL (ref 0.0–1.0)
pH: 6 (ref 5.0–8.0)

## 2014-08-05 LAB — URINE MICROSCOPIC-ADD ON

## 2014-08-05 LAB — LIPASE, BLOOD: Lipase: 29 U/L (ref 11–59)

## 2014-08-05 MED ORDER — METRONIDAZOLE 500 MG PO TABS: 500.0000 mg | ORAL_TABLET | Freq: Two times a day (BID) | ORAL | Status: DC

## 2014-08-05 MED ORDER — IOHEXOL 300 MG/ML  SOLN
50.0000 mL | Freq: Once | INTRAMUSCULAR | Status: AC | PRN
  Administered 2014-08-05: 50 mL via ORAL

## 2014-08-05 MED ORDER — CIPROFLOXACIN HCL 500 MG PO TABS: 500.0000 mg | ORAL_TABLET | Freq: Two times a day (BID) | ORAL | Status: DC

## 2014-08-05 NOTE — Discharge Instructions (Signed)

## 2014-08-05 NOTE — ED Notes (Signed)
Patient is still unable to urinate 

## 2014-08-05 NOTE — ED Notes (Signed)
Patient tried to urinate and was unsuccessful

## 2014-08-05 NOTE — ED Notes (Signed)
Melissa Morton [phone] call when patient is getting discharged.

## 2014-08-05 NOTE — ED Notes (Signed)
Pt c/o LLQ abdominal pain and nausea onset Saturday, denies emesis, diarrhea, or urinary symptoms. Pt currently taking Abx for UTI.

## 2014-08-05 NOTE — ED Provider Notes (Signed)
CSN: [id number]     Arrival date & time 08/05/14  1118 History   First MD Initiated Contact with Patient 08/05/14 1219     No chief complaint on file.    (Consider location/radiation/quality/duration/timing/severity/associated sxs/prior Treatment) HPI The patient reports she was diagnosed with a urinary tract infection 4 days ago. She had seen her primary care provider for "cloudy" urine. She was not having abdominal pain at that time. Two and a half days ago she developed left lower quadrant abdominal pain. She reports it has continued to worsen and is similar to prior episodes of diverticulitis. Past Medical History  Diagnosis Date  . Reflux   . Diverticulitis   . Hypertension   . Hyperlipemia   . Shortness of breath   . GERD (gastroesophageal reflux disease)   . PONV (postoperative nausea and vomiting)     has used scop patch-that helped  . Wears glasses   . Full dentures   . Breast cancer, left breast 12/07/2011  . Breast cancer, left breast 11/24/11    left breast 4 o'clock bx=invasive ductal ca,grade II,ER/PR=+  . Renal insufficiency 11/09/2011  . Arthritis     knee and hands  . Allergy     seasonal allergies  . HOH (hard of hearing)     right ear   . Hx of radiation therapy 03/14/12- 05/02/12    left breast 5040 gray in 28 fx, site of presentation lower outer quadrant boosted to 6440 gray  . Left knee DJD    Past Surgical History  Procedure Laterality Date  . Knee surgery  2000    rt   . Cholecystectomy    . Tubal ligation    . Bladder suspension    . Tonsillectomy    . Fracture surgery      lt foot  . Eye surgery      both cataracts  . Breast lumpectomy  01/04/2012    left,invasive ductcl ca, grade II,lloq, lymphovascular and perineural invaion ,DCIS,  . Abdominal hysterectomy  93    total  . Total knee arthroplasty Left 12/24/2013    Procedure: LEFT TOTAL KNEE ARTHROPLASTY;  Surgeon: Lorn Junes, MD;  Location: [facility];  Service: Orthopedics;  Laterality: Left;    Family History  Problem Relation Age of Onset  . Breast cancer Sister 79  . Leukemia Sister   . Heart disease Father   . Breast cancer Maternal Aunt 36  . Lung cancer Cousin     smoker   History  Substance Use Topics  . Smoking status: Former Smoker -- 0.25 packs/day for 20 years    Types: Cigarettes    Quit date: 11/09/1991  . Smokeless tobacco: Never Used  . Alcohol Use: Yes     Comment: socially   OB History    No data available     Review of Systems  10 Systems reviewed and are negative for acute change except as noted in the HPI.   Allergies  Hydromorphone hcl; Nsaids; and Prednisone  Home Medications   Prior to Admission medications   Medication Sig Start Date End Date Taking? Authorizing Provider  acetaminophen (TYLENOL) 325 MG tablet Take 650 mg by mouth every 6 (six) hours as needed for moderate pain (pain).    Yes Historical Provider, MD  alendronate (FOSAMAX) 70 MG tablet Take 70 mg by mouth once a week. On Fridays. Take with a full glass of water on an empty stomach.   Yes Historical Provider, MD  allopurinol (  ZYLOPRIM) 300 MG tablet Take 300 mg by mouth daily.   Yes Historical Provider, MD  anastrozole (ARIMIDEX) 1 MG tablet take 1 tablet by mouth once daily 04/12/14  Yes Truitt Merle, MD  diphenhydramine-acetaminophen (TYLENOL PM) 25-500 MG TABS Take 2 tablets by mouth at bedtime as needed (sleep).    Yes Historical Provider, MD  losartan (COZAAR) 100 MG tablet Take 100 mg by mouth daily.   Yes Historical Provider, MD  pantoprazole (PROTONIX) 40 MG tablet Take 40 mg by mouth daily.   Yes Historical Provider, MD  polycarbophil (FIBERCON) 625 MG tablet Take 625 mg by mouth daily.   Yes Historical Provider, MD  potassium chloride SA (K-DUR,KLOR-CON) 20 MEQ tablet Take 20 mEq by mouth daily.   Yes Historical Provider, MD  RABEprazole (ACIPHEX) 20 MG tablet Take 20 mg by mouth daily.  08/29/12  Yes Historical Provider, MD  simvastatin (ZOCOR) 40 MG tablet Take 40  mg by mouth every evening.   Yes Historical Provider, MD  solifenacin (VESICARE) 10 MG tablet Take 10 mg by mouth daily.   Yes Historical Provider, MD  sulfamethoxazole-trimethoprim (BACTRIM DS,SEPTRA DS) 800-160 MG per tablet Take 1 tablet by mouth 2 (two) times daily.   Yes Historical Provider, MD  traMADol (ULTRAM) 50 MG tablet Take 50 mg by mouth every 6 (six) hours as needed for moderate pain or severe pain (pain).   Yes Historical Provider, MD  albuterol (PROVENTIL) (2.5 MG/3ML) 0.083% nebulizer solution Take 2.5 mg by nebulization every 4 (four) hours as needed for wheezing.     Historical Provider, MD  ciprofloxacin (CIPRO) 500 MG tablet Take 1 tablet (500 mg total) by mouth 2 (two) times daily. One po bid x 7 days 08/05/14   Charlesetta Shanks, MD  colchicine 0.6 MG tablet Take 0.6 mg by mouth as needed (for gout flare).     Historical Provider, MD  metroNIDAZOLE (FLAGYL) 500 MG tablet Take 1 tablet (500 mg total) by mouth 2 (two) times daily. 08/05/14   Charlesetta Shanks, MD   BP 172/75 mmHg  Pulse 73  Temp(Src) 98.1 F (36.7 C) (Oral)  Resp 17  SpO2 100% Physical Exam  Constitutional: She is oriented to person, place, and time. She appears well-developed and well-nourished.  HENT:  Head: Normocephalic and atraumatic.  Eyes: EOM are normal. Pupils are equal, round, and reactive to light.  Neck: Neck supple.  Cardiovascular: Normal rate, regular rhythm, normal heart sounds and intact distal pulses.   Pulmonary/Chest: Effort normal and breath sounds normal.  Abdominal: Soft. Bowel sounds are normal. She exhibits no distension. There is tenderness. There is no rebound and no guarding.  Patient has moderate to severe left lower quadrant tenderness to palpation and also tenderness in the suprapubic area.  Musculoskeletal: Normal range of motion. She exhibits no edema.  Neurological: She is alert and oriented to person, place, and time. She has normal strength. Coordination normal. GCS eye  subscore is 4. GCS verbal subscore is 5. GCS motor subscore is 6.  Skin: Skin is warm, dry and intact.  Psychiatric: She has a normal mood and affect.    ED Course  Procedures (including critical care time) Labs Review Labs Reviewed  CBC WITH DIFFERENTIAL/PLATELET - Abnormal; Notable for the following:    WBC 11.1 (*)    Neutro Abs 8.1 (*)    All other components within normal limits  COMPREHENSIVE METABOLIC PANEL - Abnormal; Notable for the following:    Glucose, Bld 114 (*)  BUN 28 (*)    Creatinine, Ser 1.66 (*)    GFR calc non Af Amer 28 (*)    GFR calc Af Amer 33 (*)    All other components within normal limits  URINALYSIS, ROUTINE W REFLEX MICROSCOPIC - Abnormal; Notable for the following:    APPearance CLOUDY (*)    Bilirubin Urine SMALL (*)    Leukocytes, UA MODERATE (*)    All other components within normal limits  URINE MICROSCOPIC-ADD ON - Abnormal; Notable for the following:    Squamous Epithelial / LPF MANY (*)    Bacteria, UA FEW (*)    Crystals CA OXALATE CRYSTALS (*)    All other components within normal limits  LIPASE, BLOOD    Imaging Review Ct Abdomen Pelvis Wo Contrast  08/05/2014   CLINICAL DATA:  LEFT lower quadrant abdominal pain and nausea for 2 days, on antibiotics for UTI, past history hypertension, hyperlipidemia, LEFT breast cancer post lumpectomy and radiation therapy, diverticulitis, GERD, renal insufficiency, former smoker  EXAM: CT ABDOMEN AND PELVIS WITHOUT CONTRAST  TECHNIQUE: Multidetector CT imaging of the abdomen and pelvis was performed following the standard protocol without IV contrast. Sagittal and coronal MPR images reconstructed from axial data set. Patient drank dilute oral contrast for exam.  COMPARISON:  12/07/2010  FINDINGS: Lung bases clear.  Small hiatal hernia.  Mitral annular calcification.  Gallbladder surgically absent.  Within limits of a nonenhanced exam, no focal abnormalities of the liver, spleen, pancreas, or RIGHT kidney.   Tiny nonobstructing LEFT renal calculus image 24.  BILATERAL adrenal gland thickening with a 14 x 13 mm low-attenuation LEFT adrenal nodule consistent with adenoma.  Normal appendix.  Uterus surgically absent with nonvisualization of ovaries.  Diverticulosis of the sigmoid colon without pericolic inflammatory changes.  Wall thickening however is identified involving distal transverse through sigmoid colon, findings favoring colitis over diverticulitis.  Ascending and proximal transverse colon unremarkable.  Stomach and small bowel loops unremarkable.  No mass, adenopathy, free fluid, or free air.  Small umbilical and periumbilical ventral hernias containing fat.  Multilevel degenerative disc disease changes of the thoracolumbar spine.  IMPRESSION: Sigmoid diverticulosis without definite evidence of pericolic inflammatory changes to suggest acute diverticulitis.  Mild wall thickening of the distal transverse through sigmoid colon most suspicious for distal colitis.  Small hiatal hernia.  Small umbilical and periumbilical ventral hernias containing fat.  LEFT adrenal adenoma.   Electronically Signed   By: Lavonia Dana M.D.   On: 08/05/2014 15:58     EKG Interpretation None      MDM   Final diagnoses:  Colitis   The patient's in stable condition. She has not had any vomiting or fever. CT shows colitis. She will be started on Cipro and Flagyl for outpatient treatment. She is counseled on signs and symptoms for which return.    Charlesetta Shanks, MD 08/06/14 [phone]

## 2014-10-02 ENCOUNTER — Other Ambulatory Visit: Payer: Self-pay | Admitting: *Deleted

## 2014-10-02 DIAGNOSIS — C50912 Malignant neoplasm of unspecified site of left female breast: Secondary | ICD-10-CM

## 2014-10-03 ENCOUNTER — Telehealth: Payer: Self-pay | Admitting: Hematology

## 2014-10-03 ENCOUNTER — Ambulatory Visit (HOSPITAL_BASED_OUTPATIENT_CLINIC_OR_DEPARTMENT_OTHER): Payer: Medicare Other | Admitting: Hematology

## 2014-10-03 ENCOUNTER — Other Ambulatory Visit (HOSPITAL_BASED_OUTPATIENT_CLINIC_OR_DEPARTMENT_OTHER): Payer: Medicare Other

## 2014-10-03 ENCOUNTER — Encounter: Payer: Self-pay | Admitting: Hematology

## 2014-10-03 VITALS — BP 191/78 | HR 61 | Temp 97.9°F | Resp 18 | Ht 64.0 in | Wt 169.5 lb

## 2014-10-03 DIAGNOSIS — Z170 Estrogen receptor positive status [ER+]: Secondary | ICD-10-CM | POA: Diagnosis not present

## 2014-10-03 DIAGNOSIS — M859 Disorder of bone density and structure, unspecified: Secondary | ICD-10-CM | POA: Diagnosis not present

## 2014-10-03 DIAGNOSIS — C50912 Malignant neoplasm of unspecified site of left female breast: Secondary | ICD-10-CM

## 2014-10-03 DIAGNOSIS — Z79811 Long term (current) use of aromatase inhibitors: Secondary | ICD-10-CM

## 2014-10-03 LAB — CBC WITH DIFFERENTIAL/PLATELET
BASO%: 1.2 % (ref 0.0–2.0)
BASOS ABS: 0.1 10*3/uL (ref 0.0–0.1)
EOS ABS: 0.2 10*3/uL (ref 0.0–0.5)
EOS%: 3.1 % (ref 0.0–7.0)
HEMATOCRIT: 38.3 % (ref 34.8–46.6)
HEMOGLOBIN: 12.8 g/dL (ref 11.6–15.9)
LYMPH%: 27.5 % (ref 14.0–49.7)
MCH: 30.9 pg (ref 25.1–34.0)
MCHC: 33.3 g/dL (ref 31.5–36.0)
MCV: 92.7 fL (ref 79.5–101.0)
MONO#: 0.6 10*3/uL (ref 0.1–0.9)
MONO%: 7.9 % (ref 0.0–14.0)
NEUT%: 60.3 % (ref 38.4–76.8)
NEUTROS ABS: 4.2 10*3/uL (ref 1.5–6.5)
Platelets: 248 10*3/uL (ref 145–400)
RBC: 4.13 10*6/uL (ref 3.70–5.45)
RDW: 14.7 % — ABNORMAL HIGH (ref 11.2–14.5)
WBC: 7 10*3/uL (ref 3.9–10.3)
lymph#: 1.9 10*3/uL (ref 0.9–3.3)

## 2014-10-03 LAB — COMPREHENSIVE METABOLIC PANEL (CC13)
ALBUMIN: 3.7 g/dL (ref 3.5–5.0)
ALT: 12 U/L (ref 0–55)
AST: 19 U/L (ref 5–34)
Alkaline Phosphatase: 74 U/L (ref 40–150)
Anion Gap: 9 mEq/L (ref 3–11)
BILIRUBIN TOTAL: 0.47 mg/dL (ref 0.20–1.20)
BUN: 23.2 mg/dL (ref 7.0–26.0)
CO2: 25 mEq/L (ref 22–29)
Calcium: 9.1 mg/dL (ref 8.4–10.4)
Chloride: 108 mEq/L (ref 98–109)
Creatinine: 1.1 mg/dL (ref 0.6–1.1)
EGFR: 48 mL/min/{1.73_m2} — ABNORMAL LOW (ref >90)
GLUCOSE: 99 mg/dL (ref 70–140)
Potassium: 4 mEq/L (ref 3.5–5.1)
Sodium: 142 mEq/L (ref 136–145)
TOTAL PROTEIN: 6.3 g/dL — AB (ref 6.4–8.3)

## 2014-10-03 NOTE — Progress Notes (Signed)
[facility] OFFICE PROGRESS NOTE  [name], MD [place] [address]  DIAGNOSIS: Breast cancer, left breast - Plan: MM Digital Diagnostic Bilat   Breast cancer, left breast   Staging form: Breast, AJCC 7th Edition     Clinical stage from 12/07/2011: Stage IA (T1, N0, cM0) - Signed by Haywood Lasso, MD on 12/07/2011       Prognostic indicators: ER = 100%  PR = 100%  Ki67 = 12%  Her 2 equivocal on BX, 1.26 (no amp) on lumpectomy      Pathologic: Stage IA (T1c, N0, cM0) - Signed by Haywood Lasso, MD on 01/24/2012       Prognostic indicators: ER = 100%  PR = 100%  Ki67 = 12%  Her 2 equivocal on BX, 1.26 (no amp) on lumpectomy     Breast cancer, left breast   12/07/2011 Initial Diagnosis Breast cancer, left breast   01/02/2012 Surgery lumpectomy     - 05/02/2012 Radiation Therapy 35 radiation treatments under the direction of Dr. Sondra Come that was completed on 05/02/2012.    05/06/2012 -  Anti-estrogen oral therapy Arimidex 1 mg daily    Chief complain: follow up   CURRENT THERAPY: Arimidex 1 mg by mouth daily for an anticipated 5 years. Arimidex was started on 05/06/2012.  INTERVAL HISTORY: Pt returns for follow up. She is doing well, no new complains. She remains active, chronic left knee pain unchanged. She is tolerating Arimidex well without significant issues. She denies any pain, dyspnea, or significant muscular or joint problem.  MEDICAL HISTORY: Past Medical History  Diagnosis Date  . Reflux   . Diverticulitis   . Hypertension   . Hyperlipemia   . Shortness of breath   . GERD (gastroesophageal reflux disease)   . PONV (postoperative nausea and vomiting)     has used scop patch-that helped  . Wears glasses   . Full dentures   . Breast cancer, left breast 12/07/2011  . Breast cancer, left breast 11/24/11    left breast 4 o'clock bx=invasive ductal ca,grade II,ER/PR=+  . Renal insufficiency 11/09/2011  . Arthritis      knee and hands  . Allergy     seasonal allergies  . HOH (hard of hearing)     right ear   . Hx of radiation therapy 03/14/12- 05/02/12    left breast 5040 gray in 28 fx, site of presentation lower outer quadrant boosted to 6440 gray  . Left knee DJD     INTERIM HISTORY: has Leucocytosis; Renal insufficiency; Gout; Breast cancer, left breast; Hx of radiation therapy; Osteoporosis; PONV (postoperative nausea and vomiting); GERD (gastroesophageal reflux disease); Hypertension; Left knee DJD; DJD (degenerative joint disease) of knee; and UTI (urinary tract infection) Klebsiella on her problem list.    ALLERGIES:  is allergic to hydromorphone hcl; nsaids; and prednisone.  MEDICATIONS: has a current medication list which includes the following prescription(s): acetaminophen, albuterol, alendronate, allopurinol, anastrozole, colchicine, diphenhydramine-acetaminophen, esomeprazole, losartan, polycarbophil, potassium chloride sa, rabeprazole, simvastatin, solifenacin, and tramadol.  SURGICAL HISTORY:  Past Surgical History  Procedure Laterality Date  . Knee surgery  2000    rt   . Cholecystectomy    . Tubal ligation    . Bladder suspension    . Tonsillectomy    . Fracture surgery      lt foot  . Eye surgery      both cataracts  . Breast lumpectomy  01/04/2012  left,invasive ductcl ca, grade II,lloq, lymphovascular and perineural invaion ,DCIS,  . Abdominal hysterectomy  93    total  . Total knee arthroplasty Left 12/24/2013    Procedure: LEFT TOTAL KNEE ARTHROPLASTY;  Surgeon: Lorn Junes, MD;  Location: [facility];  Service: Orthopedics;  Laterality: Left;   PROBLEM LIST:  1. Stage I, T1c N0 M0, 1.3 cm. invasive ductal carcinoma in situ, grade 2. The tumor was ER 100%, PR 100% positive, HER- 2/neu negative. HER-2/neu was negative by FISH. Ki67 proliferation marker was 14%. There was evidence of lymphovascular and perineural invasion. The tumor was 2 mm from the nearest margin inferiorly.  None of the 3 lymph nodes sampled had evidence of tumor. She underwent a lumpectomy with sentinel node dissection on 01/04/2012. Radiation treatments to the left breast were administered by Dr. Gery Pray from 03/14/2012 to 05/02/2012. The patient received 5040 cGy in 28 fractions plus a boost, for a total of 6440 cGy in 35 fractions. Genetic testing was carried out with the BreastNext gene panel through [employer]. No cancer causing mutations were detected in any of the 18 genes tested. She began taking Arimidex 1 mg PO daily on 05/06/2012.  2. Overactive bladder  3. Gout  4. Hyperlipidemia  5. 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 HTN  7. Osteopenia  8. Arthritis  9. Impaired renal functioning  10. GERD  11. Sigmoid diverticulosis requiring admission in August 2012.  REVIEW OF SYSTEMS:   Constitutional: Denies fevers, chills or abnormal weight loss Eyes: Denies blurriness of vision Ears, nose, mouth, throat, and face: Denies mucositis or sore throat Respiratory: Denies cough, dyspnea or wheezes Cardiovascular: Denies palpitation, chest discomfort or lower extremity swelling Gastrointestinal:  Denies nausea, heartburn or change in bowel habits Skin: Denies abnormal skin rashes Lymphatics: Denies new lymphadenopathy or easy bruising Neurological:Denies numbness, tingling or new weaknesses Behavioral/Psych: Mood is stable, no new changes  All other systems were reviewed with the patient and are negative.  PHYSICAL EXAMINATION: ECOG PERFORMANCE STATUS: 0 - Asymptomatic  Blood pressure 191/78, pulse 61, temperature 97.9 F (36.6 C), temperature source Oral, resp. rate 18, height $RemoveBe'5\' 4"'[id number]$  (1.626 m), weight 169 lb 8 oz (76.885 kg), SpO2 100 %.  GENERAL:alert, no distress and comfortable elderly female who is easily mobile.  SKIN: skin color, texture, turgor are normal, no rashes or significant lesions EYES: normal, Conjunctiva are pink and non-injected, sclera clear OROPHARYNX:no exudate,  no erythema and lips, buccal mucosa, and tongue normal  NECK: supple, thyroid normal size, non-tender, without nodularity LYMPH:  no palpable lymphadenopathy in the cervical, axillary or supraclavicular LUNGS: clear to auscultation and percussion with normal breathing effort HEART: regular rate & rhythm and no murmurs and no lower extremity edema BREASTS: Right breast is normal; left breast slightly smaller than the right breast.  There were no suspicious findings or masses or dimpling or nipple inversion or induration.  ABDOMEN:abdomen soft, non-tender and normal bowel sounds Musculoskeletal:no cyanosis of digits and no clubbing  NEURO: alert & oriented x 3 with fluent speech, no focal motor/sensory deficits  LABORATORY DATA: CBC Latest Ref Rng 10/03/2014 08/05/2014 04/04/2014  WBC 3.9 - 10.3 10e3/uL 7.0 11.1(H) 7.8  Hemoglobin 11.6 - 15.9 g/dL 12.8 14.1 12.9  Hematocrit 34.8 - 46.6 % 38.3 42.2 40.2  Platelets 145 - 400 10e3/uL 248 264 282    CMP Latest Ref Rng 10/03/2014 08/05/2014 04/04/2014  Glucose 70 - 140 mg/dl 99 114(H) 92  BUN 7.0 - 26.0 mg/dL 23.2 28(H) 20.8  Creatinine 0.6 - 1.1 mg/dL 1.1 1.66(H) 1.4(H)  Sodium 136 - 145 mEq/L 142 137 143  Potassium 3.5 - 5.1 mEq/L 4.0 3.7 4.1  Chloride 96 - 112 mmol/L - 102 -  CO2 22 - 29 mEq/L _0 Calcium 8.4 - 10.4 mg/dL 9.1 9.8 9.8  Total Protein 6.4 - 8.3 g/dL 6.3(L) 7.5 6.6  Total Bilirubin 0.20 - 1.20 mg/dL 0.47 0.7 0.46  Alkaline Phos 40 - 150 U/L 74 66 82  AST 5 - 34 U/L _1 ALT 0 - 55 U/L _2 RADIOGRAPHIC STUDIES: Mammogram 03/06/2014 No evidence of breast malignancy.  Bone density scan on 04/01/2014 Low bone mass. Lowest BMD 0.662 at distal left radius, T score -2.4   ASSESSMENT: Melissa Morton 81 y.o. female with a history of Breast cancer, left breast - Plan: MM Digital Diagnostic Bilat  PLAN:  1. Breast cancer. T1cN0, stage I, ER+/PR+, HER2- -- status post lumpectomy and radiation  treatments. -She is clinically doing very well, physical exam and her last mammogram showed no evidence of recurrence. --Continue Arimidex 1 mg by mouth daily.  This was started on 05/06/2012, plan for a total of 5 years  --recent screening Mammogram negative.  We'll continue annual screening. -I encouraged her to have healthy diet and regular exercise  2. osteopenia -I reviewed her bone density scan results. She has osteopenia. -calcium has been held due to hypercalciemia in may 2015.  Her calcium level is normal today. I encouraged her to restart calcium 1 tablet daily and she will continue vitamin D.  4. Follow-up. We will obtain CBC, Chemistries in six months doing her next follow up appointment. All questions were answered. The patient knows to call the clinic with any problems, questions or concerns. We can certainly see the patient much sooner if necessary.  I spent 15 minutes counseling the patient face to face. The total time spent in the appointment was 25 minutes.    Truitt Merle, MD 10/03/2014 11:17 AM

## 2014-10-03 NOTE — Telephone Encounter (Signed)
Gave and printed appts ched and avs for pt for DEC  °

## 2014-10-28 ENCOUNTER — Other Ambulatory Visit: Payer: Self-pay

## 2015-03-11 ENCOUNTER — Ambulatory Visit
Admission: RE | Admit: 2015-03-11 | Discharge: 2015-03-11 | Disposition: A | Discharge status: Home / Self Care | Payer: Medicare Other | Source: Ambulatory Visit | Attending: Hematology | Admitting: Hematology

## 2015-03-11 DIAGNOSIS — C50912 Malignant neoplasm of unspecified site of left female breast: Secondary | ICD-10-CM

## 2015-04-03 ENCOUNTER — Other Ambulatory Visit (HOSPITAL_BASED_OUTPATIENT_CLINIC_OR_DEPARTMENT_OTHER): Payer: Medicare Other

## 2015-04-03 ENCOUNTER — Ambulatory Visit (HOSPITAL_BASED_OUTPATIENT_CLINIC_OR_DEPARTMENT_OTHER): Payer: Medicare Other | Admitting: Hematology

## 2015-04-03 ENCOUNTER — Telehealth: Payer: Self-pay | Admitting: Hematology

## 2015-04-03 ENCOUNTER — Encounter: Payer: Self-pay | Admitting: Hematology

## 2015-04-03 VITALS — BP 180/67 | HR 68 | Temp 98.0°F | Resp 18 | Ht 64.0 in | Wt 170.4 lb

## 2015-04-03 DIAGNOSIS — C50512 Malignant neoplasm of lower-outer quadrant of left female breast: Secondary | ICD-10-CM

## 2015-04-03 DIAGNOSIS — M899 Disorder of bone, unspecified: Secondary | ICD-10-CM

## 2015-04-03 DIAGNOSIS — Z79811 Long term (current) use of aromatase inhibitors: Secondary | ICD-10-CM

## 2015-04-03 DIAGNOSIS — Z170 Estrogen receptor positive status [ER+]: Secondary | ICD-10-CM

## 2015-04-03 DIAGNOSIS — C50912 Malignant neoplasm of unspecified site of left female breast: Secondary | ICD-10-CM

## 2015-04-03 DIAGNOSIS — C50919 Malignant neoplasm of unspecified site of unspecified female breast: Secondary | ICD-10-CM

## 2015-04-03 LAB — CBC WITH DIFFERENTIAL/PLATELET
BASO%: 1.3 % (ref 0.0–2.0)
Basophils Absolute: 0.1 10*3/uL (ref 0.0–0.1)
EOS ABS: 0.2 10*3/uL (ref 0.0–0.5)
EOS%: 2.2 % (ref 0.0–7.0)
HCT: 37.2 % (ref 34.8–46.6)
HGB: 12.2 g/dL (ref 11.6–15.9)
LYMPH%: 26.4 % (ref 14.0–49.7)
MCH: 30 pg (ref 25.1–34.0)
MCHC: 32.8 g/dL (ref 31.5–36.0)
MCV: 91.6 fL (ref 79.5–101.0)
MONO#: 0.6 10*3/uL (ref 0.1–0.9)
MONO%: 7.9 % (ref 0.0–14.0)
NEUT#: 4.6 10*3/uL (ref 1.5–6.5)
NEUT%: 62.2 % (ref 38.4–76.8)
Platelets: 219 10*3/uL (ref 145–400)
RBC: 4.06 10*6/uL (ref 3.70–5.45)
RDW: 15.6 % — ABNORMAL HIGH (ref 11.2–14.5)
WBC: 7.3 10*3/uL (ref 3.9–10.3)
lymph#: 1.9 10*3/uL (ref 0.9–3.3)

## 2015-04-03 LAB — COMPREHENSIVE METABOLIC PANEL (CC13)
ALT: <9 U/L (ref 0–55)
AST: 15 U/L (ref 5–34)
Albumin: 3.7 g/dL (ref 3.5–5.0)
Alkaline Phosphatase: 70 U/L (ref 40–150)
Anion Gap: 7 mEq/L (ref 3–11)
BUN: 25 mg/dL (ref 7.0–26.0)
CHLORIDE: 110 meq/L — AB (ref 98–109)
CO2: 25 mEq/L (ref 22–29)
Calcium: 9.5 mg/dL (ref 8.4–10.4)
Creatinine: 1.3 mg/dL — ABNORMAL HIGH (ref 0.6–1.1)
EGFR: 38 mL/min/{1.73_m2} — ABNORMAL LOW (ref >90)
GLUCOSE: 93 mg/dL (ref 70–140)
Potassium: 4.3 mEq/L (ref 3.5–5.1)
SODIUM: 142 meq/L (ref 136–145)
Total Bilirubin: 0.55 mg/dL (ref 0.20–1.20)
Total Protein: 6.3 g/dL — ABNORMAL LOW (ref 6.4–8.3)

## 2015-04-03 MED ORDER — ANASTROZOLE 1 MG PO TABS: 1.0000 mg | ORAL_TABLET | Freq: Every day | ORAL | Status: DC

## 2015-04-03 NOTE — Telephone Encounter (Signed)
per pof to sch pt appt-gave pt copy of avs °

## 2015-04-03 NOTE — Progress Notes (Signed)
[facility] OFFICE PROGRESS NOTE  Melissa Frees, MD [place] [address]  DIAGNOSIS: Breast cancer of lower-outer quadrant of left female breast ([place])  Breast cancer, unspecified laterality - Plan: anastrozole (ARIMIDEX) 1 MG tablet    Oncology History   Breast cancer, left breast   Staging form: Breast, AJCC 7th Edition     Clinical stage from 12/07/2011: Stage IA (T1, N0, cM0) - Signed by Haywood Lasso, MD on 12/07/2011       Prognostic indicators: ER = 100%  PR = 100%  Ki67 = 12%  Her 2 equivocal on BX, 1.26 (no amp) on lumpectomy      Pathologic: Stage IA (T1c, N0, cM0) - Signed by Haywood Lasso, MD on 01/24/2012       Prognostic indicators: ER = 100%  PR = 100%  Ki67 = 12%  Her 2 equivocal on BX, 1.26 (no amp) on lumpectomy        Breast cancer of lower-outer quadrant of left female breast ([place])   12/07/2011 Initial Diagnosis Breast cancer, left breast   01/02/2012 Surgery lumpectomy     - 05/02/2012 Radiation Therapy 35 radiation treatments under the direction of Dr. Sondra Come that was completed on 05/02/2012.    05/06/2012 -  Anti-estrogen oral therapy Arimidex 1 mg daily    Chief complain: follow up   CURRENT THERAPY: Arimidex 1 mg by mouth daily for an anticipated 5 years. Arimidex was started on 05/06/2012.  INTERVAL HISTORY: Pt returns for follow up. She is accompanied to the clinic by her friend. She is doing very well overall. She has a mild arthritis related pain, tolerable, unchanged. She remains to be active, goes out for shopping quite often. She denies any new pain, dyspnea, GI or any other symptoms. She has good appetite and energy level, weight is stable. She is taking calcium and vitamin D.  MEDICAL HISTORY: Past Medical History  Diagnosis Date  . Reflux   . Diverticulitis   . Hypertension   . Hyperlipemia   . Shortness of breath   . GERD (gastroesophageal reflux disease)   . PONV (postoperative nausea  and vomiting)     has used scop patch-that helped  . Wears glasses   . Full dentures   . Breast cancer, left breast ([place]) 12/07/2011  . Breast cancer, left breast ([facility]) 11/24/11    left breast 4 o'clock bx=invasive ductal ca,grade II,ER/PR=+  . Renal insufficiency 11/09/2011  . Arthritis     knee and hands  . Allergy     seasonal allergies  . HOH (hard of hearing)     right ear   . Hx of radiation therapy 03/14/12- 05/02/12    left breast 5040 gray in 28 fx, site of presentation lower outer quadrant boosted to 6440 gray  . Left knee DJD     INTERIM HISTORY: has Leucocytosis; Renal insufficiency; Gout; Breast cancer of lower-outer quadrant of left female breast ([place]); Hx of radiation therapy; Osteoporosis; PONV (postoperative nausea and vomiting); GERD (gastroesophageal reflux disease); Hypertension; Left knee DJD; DJD (degenerative joint disease) of knee; and UTI (urinary tract infection) Klebsiella on her problem list.    ALLERGIES:  is allergic to hydromorphone hcl; nsaids; and prednisone.  MEDICATIONS: has a current medication list which includes the following prescription(s): acetaminophen, albuterol, alendronate, allopurinol, anastrozole, colchicine, diphenhydramine-acetaminophen, esomeprazole, losartan, polycarbophil, potassium chloride sa, proair hfa, rabeprazole, simvastatin, solifenacin, and tramadol.  SURGICAL HISTORY:  Past Surgical History  Procedure  Laterality Date  . Knee surgery  2000    rt   . Cholecystectomy    . Tubal ligation    . Bladder suspension    . Tonsillectomy    . Fracture surgery      lt foot  . Eye surgery      both cataracts  . Breast lumpectomy  01/04/2012    left,invasive ductcl ca, grade II,lloq, lymphovascular and perineural invaion ,DCIS,  . Abdominal hysterectomy  93    total  . Total knee arthroplasty Left 12/24/2013    Procedure: LEFT TOTAL KNEE ARTHROPLASTY;  Surgeon: Lorn Junes, MD;  Location: [facility];  Service: Orthopedics;  Laterality:  Left;   PROBLEM LIST:  1. Stage I, T1c N0 M0, 1.3 cm. invasive ductal carcinoma in situ, grade 2. The tumor was ER 100%, PR 100% positive, HER- 2/neu negative. HER-2/neu was negative by FISH. Ki67 proliferation marker was 14%. There was evidence of lymphovascular and perineural invasion. The tumor was 2 mm from the nearest margin inferiorly. None of the 3 lymph nodes sampled had evidence of tumor. She underwent a lumpectomy with sentinel node dissection on 01/04/2012. Radiation treatments to the left breast were administered by Dr. Gery Pray from 03/14/2012 to 05/02/2012. The patient received 5040 cGy in 28 fractions plus a boost, for a total of 6440 cGy in 35 fractions. Genetic testing was carried out with the BreastNext gene panel through [employer]. No cancer causing mutations were detected in any of the 18 genes tested. She began taking Arimidex 1 mg PO daily on 05/06/2012.  2. Overactive bladder  3. Gout  4. Hyperlipidemia  5. 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 HTN  7. Osteopenia  8. Arthritis  9. Impaired renal functioning  10. GERD  11. Sigmoid diverticulosis requiring admission in August 2012.  REVIEW OF SYSTEMS:   Constitutional: Denies fevers, chills or abnormal weight loss Eyes: Denies blurriness of vision Ears, nose, mouth, throat, and face: Denies mucositis or sore throat Respiratory: Denies cough, dyspnea or wheezes Cardiovascular: Denies palpitation, chest discomfort or lower extremity swelling Gastrointestinal:  Denies nausea, heartburn or change in bowel habits Skin: Denies abnormal skin rashes Lymphatics: Denies new lymphadenopathy or easy bruising Neurological:Denies numbness, tingling or new weaknesses Behavioral/Psych: Mood is stable, no new changes  All other systems were reviewed with the patient and are negative.  PHYSICAL EXAMINATION: ECOG PERFORMANCE STATUS: 0 - Asymptomatic  Blood pressure 180/67, pulse 68, temperature 98 F (36.7 C), temperature  source Oral, resp. rate 18, height 5' 4"  (1.626 m), weight 170 lb 6.4 oz (77.293 kg), SpO2 99 %.  GENERAL:alert, no distress and comfortable elderly female who is easily mobile.  SKIN: skin color, texture, turgor are normal, no rashes or significant lesions EYES: normal, Conjunctiva are pink and non-injected, sclera clear OROPHARYNX:no exudate, no erythema and lips, buccal mucosa, and tongue normal  NECK: supple, thyroid normal size, non-tender, without nodularity LYMPH:  no palpable lymphadenopathy in the cervical, axillary or supraclavicular LUNGS: clear to auscultation and percussion with normal breathing effort HEART: regular rate & rhythm and no murmurs and no lower extremity edema BREASTS: Right breast is normal; left breast slightly smaller than the right breast. Well-healed surgical scar. There were no suspicious findings or masses or dimpling or nipple inversion or induration.  ABDOMEN:abdomen soft, non-tender and normal bowel sounds Musculoskeletal:no cyanosis of digits and no clubbing  NEURO: alert & oriented x 3 with fluent speech, no focal motor/sensory deficits  LABORATORY DATA: CBC Latest Ref  Rng 04/03/2015 10/03/2014 08/05/2014  WBC 3.9 - 10.3 10e3/uL 7.3 7.0 11.1(H)  Hemoglobin 11.6 - 15.9 g/dL 12.2 12.8 14.1  Hematocrit 34.8 - 46.6 % 37.2 38.3 42.2  Platelets 145 - 400 10e3/uL 219 248 264    CMP Latest Ref Rng 10/03/2014 08/05/2014 04/04/2014  Glucose 70 - 140 mg/dl 99 114(H) 92  BUN 7.0 - 26.0 mg/dL 23.2 28(H) 20.8  Creatinine 0.6 - 1.1 mg/dL 1.1 1.66(H) 1.4(H)  Sodium 136 - 145 mEq/L 142 137 143  Potassium 3.5 - 5.1 mEq/L 4.0 3.7 4.1  Chloride 96 - 112 mmol/L - 102 -  CO2 22 - 29 mEq/L 25 22 24   Calcium 8.4 - 10.4 mg/dL 9.1 9.8 9.8  Total Protein 6.4 - 8.3 g/dL 6.3(L) 7.5 6.6  Total Bilirubin 0.20 - 1.20 mg/dL 0.47 0.7 0.46  Alkaline Phos 40 - 150 U/L 74 66 82  AST 5 - 34 U/L 19 18 15   ALT 0 - 55 U/L 12 14 11       RADIOGRAPHIC STUDIES: Mammogram  03/11/2015 IMPRESSION: Stable left breast lumpectomy site. No evidence of malignancy in the bilateral breasts.  RECOMMENDATION: Diagnostic mammogram is suggested in 1 year. (Code:DM-B-01Y)   ASSESSMENT: Melissa Morton 81 y.o. female with a history of Breast cancer of lower-outer quadrant of left female breast ([place])  Breast cancer, unspecified laterality - Plan: anastrozole (ARIMIDEX) 1 MG tablet  PLAN:  1.  Breast cancer of lower-outer quadrant of left female breast, T1cN0, stage I, ER+/PR+, HER2- -- status post lumpectomy and radiation treatments, currently on adjuvant anastrozole. -She is clinically doing very well, physical exam and her last mammogram showed no evidence of recurrence. --Continue Arimidex 1 mg by mouth daily. She is tolerating very well. This was started on 05/06/2012, plan for a total of 5 years  --recent screening Mammogram negative.  We'll continue annual screening. -Lab reviewed, her CBC is normal. CMP still pending. -I encouraged her to continue healthy diet and regular exercise.   2. osteopenia -I reviewed her bone density scan results from 2015. She has osteopenia. -Continue calcium  and vitamin D, she is also taking Fosamax, tolerating well.  3. Hypertension, arthritis -She'll continue follow-up with her primary care physician, every 6 months.  4. Follow-up. -Return to clinic in 6 months with lab  I spent 15 minutes counseling the patient face to face. The total time spent in the appointment was 20 minutes.    Truitt Merle, MD 04/03/2015 9:42 AM

## 2015-09-01 ENCOUNTER — Ambulatory Visit (HOSPITAL_BASED_OUTPATIENT_CLINIC_OR_DEPARTMENT_OTHER): Payer: Medicare Other | Admitting: Hematology

## 2015-09-01 ENCOUNTER — Telehealth: Payer: Self-pay | Admitting: Hematology

## 2015-09-01 ENCOUNTER — Encounter: Payer: Self-pay | Admitting: Hematology

## 2015-09-01 ENCOUNTER — Other Ambulatory Visit (HOSPITAL_BASED_OUTPATIENT_CLINIC_OR_DEPARTMENT_OTHER): Payer: Medicare Other

## 2015-09-01 VITALS — BP 197/71 | HR 69 | Temp 97.7°F | Resp 18 | Ht 64.0 in | Wt 167.0 lb

## 2015-09-01 DIAGNOSIS — C50912 Malignant neoplasm of unspecified site of left female breast: Secondary | ICD-10-CM

## 2015-09-01 DIAGNOSIS — Z170 Estrogen receptor positive status [ER+]: Secondary | ICD-10-CM | POA: Diagnosis not present

## 2015-09-01 DIAGNOSIS — C50512 Malignant neoplasm of lower-outer quadrant of left female breast: Secondary | ICD-10-CM

## 2015-09-01 DIAGNOSIS — Z79811 Long term (current) use of aromatase inhibitors: Secondary | ICD-10-CM | POA: Diagnosis not present

## 2015-09-01 DIAGNOSIS — M8580 Other specified disorders of bone density and structure, unspecified site: Secondary | ICD-10-CM

## 2015-09-01 LAB — COMPREHENSIVE METABOLIC PANEL
ALBUMIN: 3.9 g/dL (ref 3.5–5.0)
ALK PHOS: 69 U/L (ref 40–150)
ALT: 14 U/L (ref 0–55)
ANION GAP: 10 meq/L (ref 3–11)
AST: 17 U/L (ref 5–34)
BILIRUBIN TOTAL: 0.54 mg/dL (ref 0.20–1.20)
BUN: 26.5 mg/dL — ABNORMAL HIGH (ref 7.0–26.0)
CALCIUM: 9.7 mg/dL (ref 8.4–10.4)
CHLORIDE: 107 meq/L (ref 98–109)
CO2: 24 mEq/L (ref 22–29)
CREATININE: 1.4 mg/dL — AB (ref 0.6–1.1)
EGFR: 35 mL/min/{1.73_m2} — ABNORMAL LOW (ref >90)
Glucose: 97 mg/dl (ref 70–140)
Potassium: 4 mEq/L (ref 3.5–5.1)
Sodium: 142 mEq/L (ref 136–145)
Total Protein: 6.6 g/dL (ref 6.4–8.3)

## 2015-09-01 LAB — CBC WITH DIFFERENTIAL/PLATELET
BASO%: 0.4 % (ref 0.0–2.0)
BASOS ABS: 0 10*3/uL (ref 0.0–0.1)
EOS%: 2.2 % (ref 0.0–7.0)
Eosinophils Absolute: 0.2 10*3/uL (ref 0.0–0.5)
HEMATOCRIT: 40.8 % (ref 34.8–46.6)
HEMOGLOBIN: 13.2 g/dL (ref 11.6–15.9)
LYMPH#: 2.2 10*3/uL (ref 0.9–3.3)
LYMPH%: 27.3 % (ref 14.0–49.7)
MCH: 30.3 pg (ref 25.1–34.0)
MCHC: 32.4 g/dL (ref 31.5–36.0)
MCV: 93.8 fL (ref 79.5–101.0)
MONO#: 0.5 10*3/uL (ref 0.1–0.9)
MONO%: 5.6 % (ref 0.0–14.0)
NEUT#: 5.3 10*3/uL (ref 1.5–6.5)
NEUT%: 64.5 % (ref 38.4–76.8)
PLATELETS: 235 10*3/uL (ref 145–400)
RBC: 4.35 10*6/uL (ref 3.70–5.45)
RDW: 14.4 % (ref 11.2–14.5)
WBC: 8.2 10*3/uL (ref 3.9–10.3)

## 2015-09-01 NOTE — Progress Notes (Signed)
[place] OFFICE PROGRESS NOTE  Melissa Frees, MD [address]  [place] [address]  DIAGNOSIS: Breast cancer of lower-outer quadrant of left female breast [facility])    Oncology History   Breast cancer, left breast   Staging form: Breast, AJCC 7th Edition     Clinical stage from 12/07/2011: Stage IA (T1, N0, cM0) - Signed by Haywood Lasso, MD on 12/07/2011       Prognostic indicators: ER = 100%  PR = 100%  Ki67 = 12%  Her 2 equivocal on BX, 1.26 (no amp) on lumpectomy      Pathologic: Stage IA (T1c, N0, cM0) - Signed by Haywood Lasso, MD on 01/24/2012       Prognostic indicators: ER = 100%  PR = 100%  Ki67 = 12%  Her 2 equivocal on BX, 1.26 (no amp) on lumpectomy        Breast cancer of lower-outer quadrant of left female breast ([place])   12/07/2011 Initial Diagnosis Breast cancer, left breast   01/02/2012 Surgery lumpectomy     - 05/02/2012 Radiation Therapy 35 radiation treatments under the direction of Dr. Sondra Come that was completed on 05/02/2012.    05/06/2012 -  Anti-estrogen oral therapy Arimidex 1 mg daily    Chief complain: follow up Breast cancer  CURRENT THERAPY: Arimidex 1 mg by mouth daily for an anticipated 5 years. Arimidex was started on 05/06/2012.  INTERVAL HISTORY: Pt returns for follow up. She is doing well overall. She reports mild intermittent neck pain, especially when she turns her neck. The pain does not radiate to her arms or other places, no other neurological symptoms. She otherwise doing very well, denies any other pain, or symptoms. She tolerates anastrozole very well, no significant hot flashes or other arthritis pain.  MEDICAL HISTORY: Past Medical History  Diagnosis Date  . Reflux   . Diverticulitis   . Hypertension   . Hyperlipemia   . Shortness of breath   . GERD (gastroesophageal reflux disease)   . PONV (postoperative nausea and vomiting)     has used scop patch-that helped  . Wears glasses   . Full  dentures   . Breast cancer, left breast ([place]) 12/07/2011  . Breast cancer, left breast ([place]) 11/24/11    left breast 4 o'clock bx=invasive ductal ca,grade II,ER/PR=+  . Renal insufficiency 11/09/2011  . Arthritis     knee and hands  . Allergy     seasonal allergies  . HOH (hard of hearing)     right ear   . Hx of radiation therapy 03/14/12- 05/02/12    left breast 5040 gray in 28 fx, site of presentation lower outer quadrant boosted to 6440 gray  . Left knee DJD     INTERIM HISTORY: has Leucocytosis; Renal insufficiency; Gout; Breast cancer of lower-outer quadrant of left female breast ([place]); Hx of radiation therapy; Osteoporosis; PONV (postoperative nausea and vomiting); GERD (gastroesophageal reflux disease); Hypertension; Left knee DJD; DJD (degenerative joint disease) of knee; and UTI (urinary tract infection) Klebsiella on her problem list.    ALLERGIES:  is allergic to hydromorphone hcl; nsaids; and prednisone.  MEDICATIONS: has a current medication list which includes the following prescription(s): acetaminophen, albuterol, alendronate, allopurinol, anastrozole, colchicine, diphenhydramine-acetaminophen, esomeprazole, losartan, polycarbophil, potassium chloride sa, proair hfa, rabeprazole, simvastatin, solifenacin, and tramadol.  SURGICAL HISTORY:  Past Surgical History  Procedure Laterality Date  . Knee surgery  2000    rt   . Cholecystectomy    .  Tubal ligation    . Bladder suspension    . Tonsillectomy    . Fracture surgery      lt foot  . Eye surgery      both cataracts  . Breast lumpectomy  01/04/2012    left,invasive ductcl ca, grade II,lloq, lymphovascular and perineural invaion ,DCIS,  . Abdominal hysterectomy  93    total  . Total knee arthroplasty Left 12/24/2013    Procedure: LEFT TOTAL KNEE ARTHROPLASTY;  Surgeon: Lorn Junes, MD;  Location: [place];  Service: Orthopedics;  Laterality: Left;   PROBLEM LIST:  1. Stage I, T1c N0 M0, 1.3 cm. invasive ductal  carcinoma in situ, grade 2. The tumor was ER 100%, PR 100% positive, HER- 2/neu negative. HER-2/neu was negative by FISH. Ki67 proliferation marker was 14%. There was evidence of lymphovascular and perineural invasion. The tumor was 2 mm from the nearest margin inferiorly. None of the 3 lymph nodes sampled had evidence of tumor. She underwent a lumpectomy with sentinel node dissection on 01/04/2012. Radiation treatments to the left breast were administered by Dr. Gery Pray from 03/14/2012 to 05/02/2012. The patient received 5040 cGy in 28 fractions plus a boost, for a total of 6440 cGy in 35 fractions. Genetic testing was carried out with the BreastNext gene panel through [employer]. No cancer causing mutations were detected in any of the 18 genes tested. She began taking Arimidex 1 mg PO daily on 05/06/2012.  2. Overactive bladder  3. Gout  4. Hyperlipidemia  5. 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 HTN  7. Osteopenia  8. Arthritis  9. Impaired renal functioning  10. GERD  11. Sigmoid diverticulosis requiring admission in August 2012.  REVIEW OF SYSTEMS:   Constitutional: Denies fevers, chills or abnormal weight loss Eyes: Denies blurriness of vision Ears, nose, mouth, throat, and face: Denies mucositis or sore throat Respiratory: Denies cough, dyspnea or wheezes Cardiovascular: Denies palpitation, chest discomfort or lower extremity swelling Gastrointestinal:  Denies nausea, heartburn or change in bowel habits Skin: Denies abnormal skin rashes Lymphatics: Denies new lymphadenopathy or easy bruising Neurological:Denies numbness, tingling or new weaknesses Behavioral/Psych: Mood is stable, no new changes  All other systems were reviewed with the patient and are negative.  PHYSICAL EXAMINATION: ECOG PERFORMANCE STATUS: 0 - Asymptomatic  Blood pressure 197/71, pulse 69, temperature 97.7 F (36.5 C), temperature source Oral, resp. rate 18, height 5' 4" (1.626 m), weight 167 lb (75.751  kg), SpO2 100 %.  GENERAL:alert, no distress and comfortable elderly female who is easily mobile.  SKIN: skin color, texture, turgor are normal, no rashes or significant lesions EYES: normal, Conjunctiva are pink and non-injected, sclera clear OROPHARYNX:no exudate, no erythema and lips, buccal mucosa, and tongue normal  NECK: supple, thyroid normal size, non-tender, without nodularity LYMPH:  no palpable lymphadenopathy in the cervical, axillary or supraclavicular LUNGS: clear to auscultation and percussion with normal breathing effort HEART: regular rate & rhythm and no murmurs and no lower extremity edema BREASTS: Right breast is normal; left breast slightly smaller than the right breast. Well-healed surgical scar. There were no suspicious findings or masses or dimpling or nipple inversion or induration.  ABDOMEN:abdomen soft, non-tender and normal bowel sounds Musculoskeletal:no cyanosis of digits and no clubbing  NEURO: alert & oriented x 3 with fluent speech, no focal motor/sensory deficits  LABORATORY DATA: CBC Latest Ref Rng 09/01/2015 04/03/2015 10/03/2014  WBC 3.9 - 10.3 10e3/uL 8.2 7.3 7.0  Hemoglobin 11.6 - 15.9 g/dL 13.2 12.2 12.8  Hematocrit 34.8 - 46.6 % 40.8 37.2 38.3  Platelets 145 - 400 10e3/uL 235 219 248    CMP Latest Ref Rng 04/03/2015 10/03/2014 08/05/2014  Glucose 70 - 140 mg/dl 93 99 114(H)  BUN 7.0 - 26.0 mg/dL 25.0 23.2 28(H)  Creatinine 0.6 - 1.1 mg/dL 1.3(H) 1.1 1.66(H)  Sodium 136 - 145 mEq/L 142 142 137  Potassium 3.5 - 5.1 mEq/L 4.3 4.0 3.7  Chloride 96 - 112 mmol/L - - 102  CO2 22 - 29 mEq/L _0 Calcium 8.4 - 10.4 mg/dL 9.5 9.1 9.8  Total Protein 6.4 - 8.3 g/dL 6.3(L) 6.3(L) 7.5  Total Bilirubin 0.20 - 1.20 mg/dL 0.55 0.47 0.7  Alkaline Phos 40 - 150 U/L 70 74 66  AST 5 - 34 U/L _1 ALT 0 - 55 U/L <_2 RADIOGRAPHIC STUDIES: Mammogram 03/11/2015 IMPRESSION: Stable left breast lumpectomy site. No evidence of malignancy in  the bilateral breasts.  RECOMMENDATION: Diagnostic mammogram is suggested in 1 year. (Code:DM-B-01Y)   ASSESSMENT: Melissa Morton 82 y.o. female with a history of Breast cancer of lower-outer quadrant of left female breast ([place])  PLAN:  1.  Breast cancer of lower-outer quadrant of left female breast, T1cN0, stage I, ER+/PR+, HER2- -- status post lumpectomy and radiation treatments, currently on adjuvant anastrozole. -She is clinically doing very well, physical exam and her last mammogram showed no evidence of recurrence. --Continue Arimidex 1 mg by mouth daily. She is tolerating very well. This was started on 05/06/2012, plan for a total of 5 years  -We'll continue breast cancer surveillance with annual mammogram, self exam and routine follow-up. -Lab reviewed, her CBC is normal. CMP still pending. -I encouraged her to continue healthy diet and regular exercise.   2. osteopenia -I reviewed her bone density scan results from 2015. She has osteopenia. -Continue calcium  and vitamin D, she is also taking Fosamax, tolerating well.  3. Hypertension, arthritis -She'll continue follow-up with her primary care physician, every 6 months.  4. Neck pain -Likely muscular in nature, she will follow-up with her primary care physician, she has an appointment soon.  4. Follow-up. -Return to clinic in 6 months with lab  I spent 15 minutes counseling the patient face to face. The total time spent in the appointment was 20 minutes.    Truitt Merle, MD 09/01/2015 10:09 AM

## 2015-09-01 NOTE — Telephone Encounter (Signed)
per pf to sch pt appt-gave pt copy of avs °

## 2015-11-03 IMAGING — CR DG CHEST 2V
2 series · 2 of 2 positions shown · non-contrast
Comparison: 09/30/2012

CLINICAL DATA: Preop for knee replacement.

EXAM:
CHEST  2 VIEW

[w chest pa]
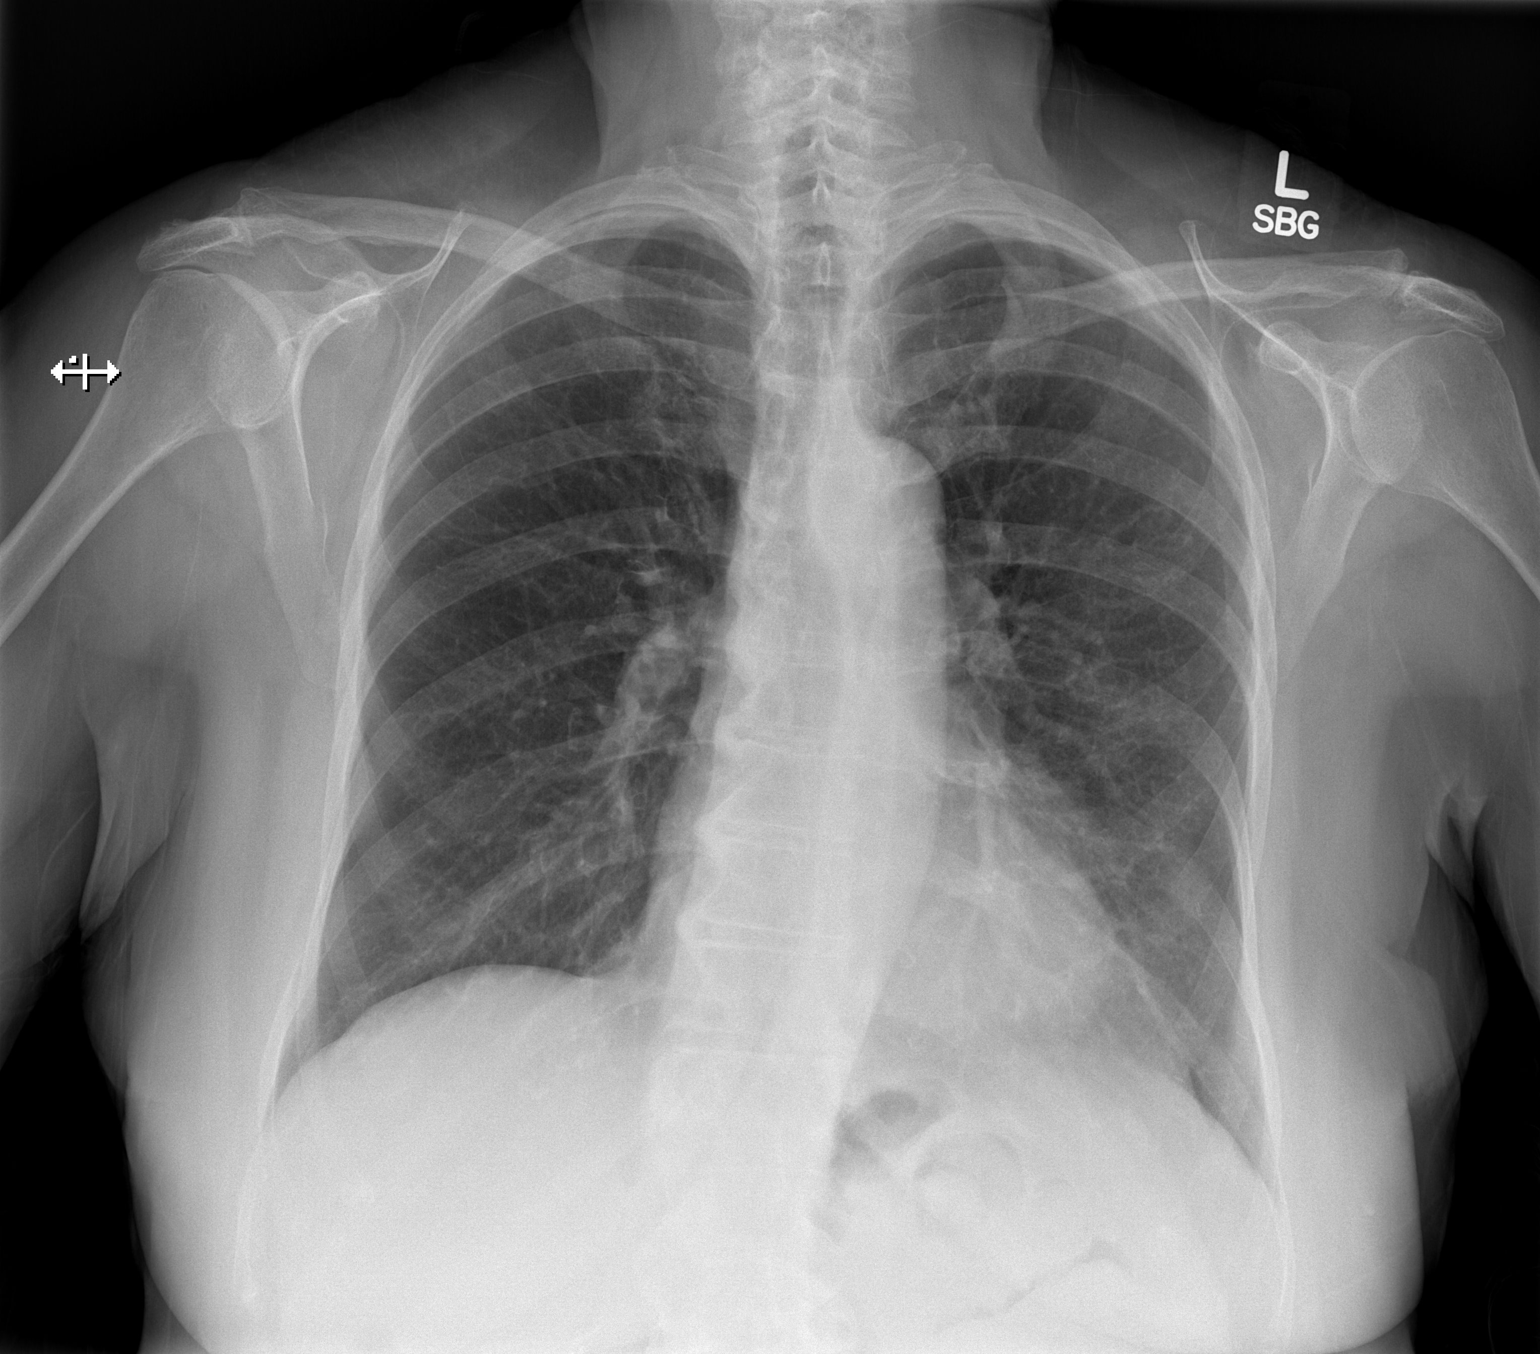

[w chest lat]
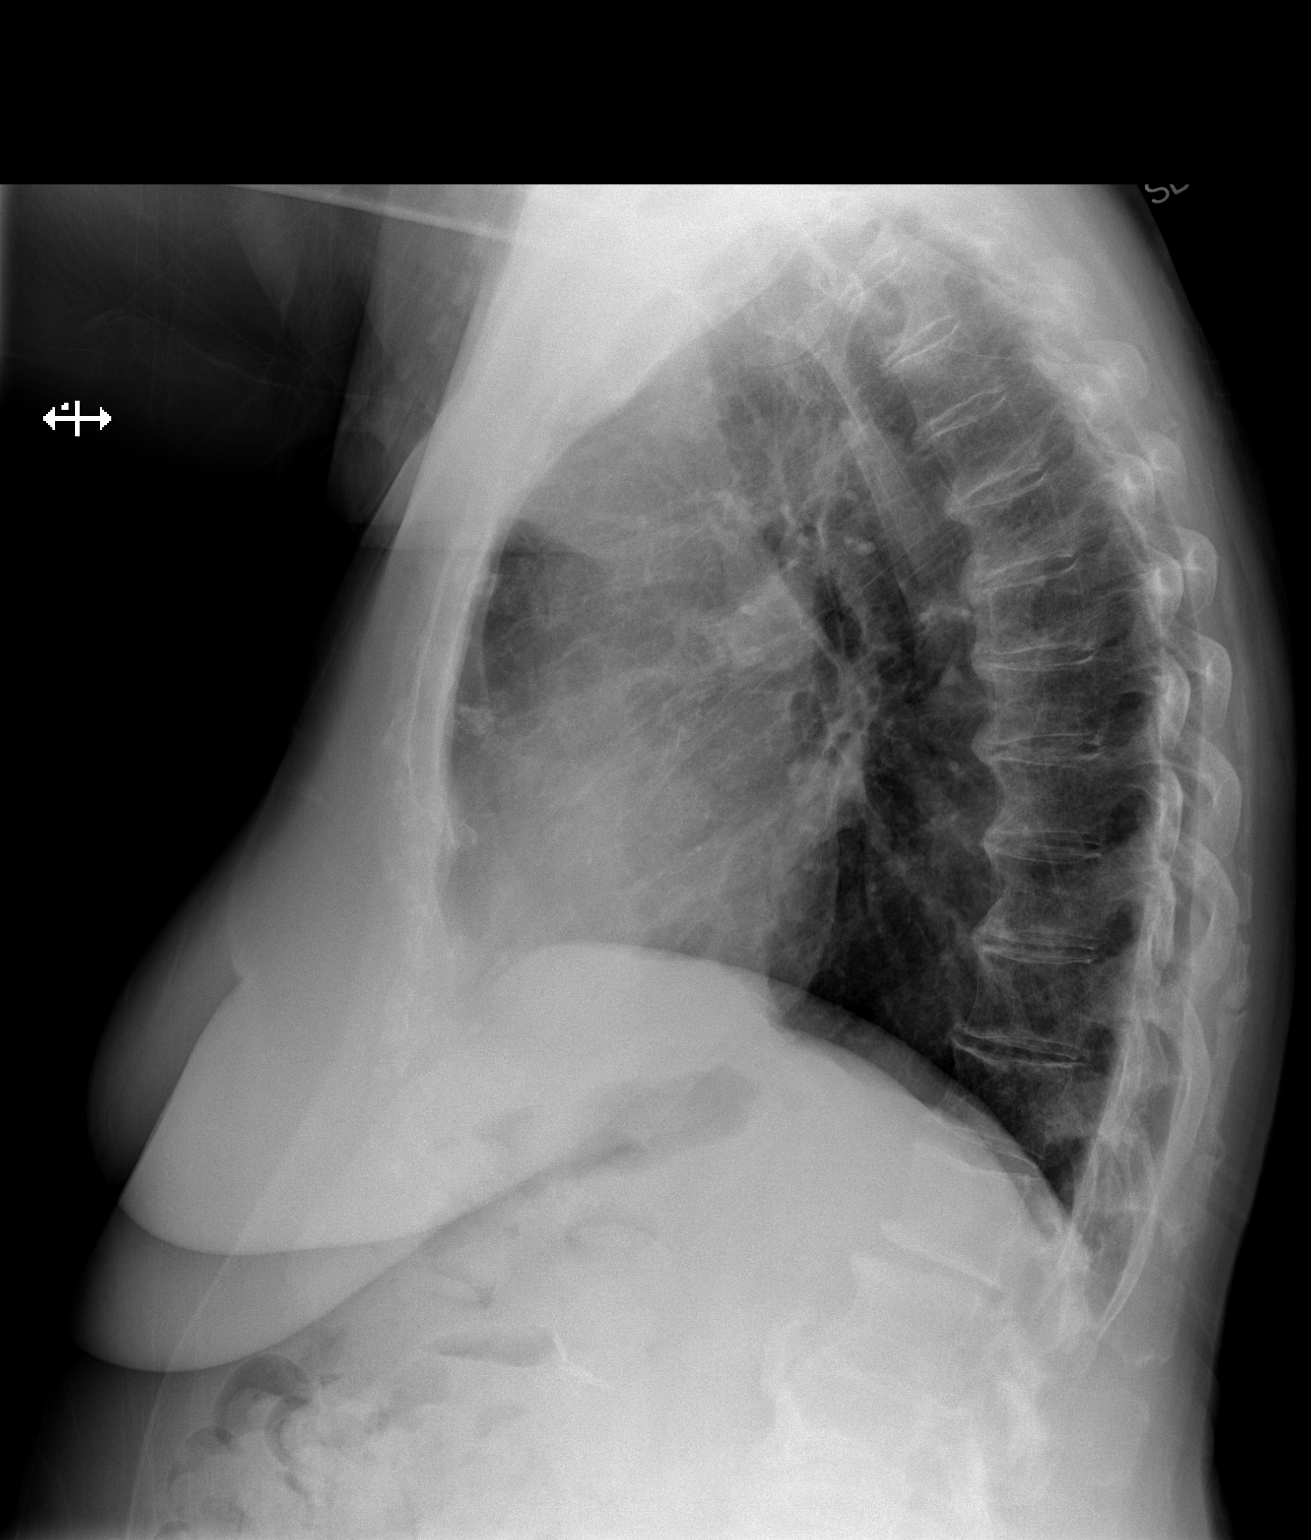

[2 of 2 positions shown; findings below may reference images not displayed]

FINDINGS: Cardiac silhouette is normal in size. No mediastinal or hilar
masses. No evidence of adenopathy.

Lungs are mildly hyperexpanded but clear. No pleural effusion or
pneumothorax.

Bony thorax is demineralized with degenerative spurring along the
mid and lower thoracic spine.
IMPRESSION: No acute cardiopulmonary disease.

## 2016-01-30 ENCOUNTER — Other Ambulatory Visit: Payer: Self-pay | Admitting: Hematology

## 2016-01-30 DIAGNOSIS — Z853 Personal history of malignant neoplasm of breast: Secondary | ICD-10-CM

## 2016-03-03 ENCOUNTER — Ambulatory Visit (HOSPITAL_BASED_OUTPATIENT_CLINIC_OR_DEPARTMENT_OTHER): Payer: Medicare Other | Admitting: Hematology

## 2016-03-03 ENCOUNTER — Telehealth: Payer: Self-pay | Admitting: Hematology

## 2016-03-03 ENCOUNTER — Other Ambulatory Visit (HOSPITAL_BASED_OUTPATIENT_CLINIC_OR_DEPARTMENT_OTHER): Payer: Medicare Other

## 2016-03-03 ENCOUNTER — Encounter: Payer: Self-pay | Admitting: Hematology

## 2016-03-03 VITALS — BP 172/70 | HR 77 | Temp 98.0°F | Resp 18 | Ht 64.0 in | Wt 173.6 lb

## 2016-03-03 DIAGNOSIS — I10 Essential (primary) hypertension: Secondary | ICD-10-CM

## 2016-03-03 DIAGNOSIS — C50512 Malignant neoplasm of lower-outer quadrant of left female breast: Secondary | ICD-10-CM | POA: Diagnosis not present

## 2016-03-03 DIAGNOSIS — Z170 Estrogen receptor positive status [ER+]: Principal | ICD-10-CM

## 2016-03-03 DIAGNOSIS — M8580 Other specified disorders of bone density and structure, unspecified site: Secondary | ICD-10-CM

## 2016-03-03 DIAGNOSIS — M542 Cervicalgia: Secondary | ICD-10-CM

## 2016-03-03 LAB — COMPREHENSIVE METABOLIC PANEL
ALT: 9 U/L (ref 0–55)
AST: 14 U/L (ref 5–34)
Albumin: 3.5 g/dL (ref 3.5–5.0)
Alkaline Phosphatase: 87 U/L (ref 40–150)
Anion Gap: 10 mEq/L (ref 3–11)
BILIRUBIN TOTAL: 0.46 mg/dL (ref 0.20–1.20)
BUN: 21.6 mg/dL (ref 7.0–26.0)
CO2: 23 mEq/L (ref 22–29)
Calcium: 9.2 mg/dL (ref 8.4–10.4)
Chloride: 111 mEq/L — ABNORMAL HIGH (ref 98–109)
Creatinine: 1.3 mg/dL — ABNORMAL HIGH (ref 0.6–1.1)
EGFR: 38 mL/min/{1.73_m2} — ABNORMAL LOW (ref >90)
GLUCOSE: 113 mg/dL (ref 70–140)
Potassium: 3.5 mEq/L (ref 3.5–5.1)
SODIUM: 144 meq/L (ref 136–145)
TOTAL PROTEIN: 6.6 g/dL (ref 6.4–8.3)

## 2016-03-03 LAB — CBC WITH DIFFERENTIAL/PLATELET
BASO%: 0.6 % (ref 0.0–2.0)
BASOS ABS: 0 10*3/uL (ref 0.0–0.1)
EOS ABS: 0.3 10*3/uL (ref 0.0–0.5)
EOS%: 3.1 % (ref 0.0–7.0)
HEMATOCRIT: 39.6 % (ref 34.8–46.6)
HEMOGLOBIN: 13 g/dL (ref 11.6–15.9)
LYMPH%: 23.6 % (ref 14.0–49.7)
MCH: 30.4 pg (ref 25.1–34.0)
MCHC: 32.9 g/dL (ref 31.5–36.0)
MCV: 92.7 fL (ref 79.5–101.0)
MONO#: 0.6 10*3/uL (ref 0.1–0.9)
MONO%: 7.7 % (ref 0.0–14.0)
NEUT%: 65 % (ref 38.4–76.8)
NEUTROS ABS: 5.2 10*3/uL (ref 1.5–6.5)
Platelets: 215 10*3/uL (ref 145–400)
RBC: 4.28 10*6/uL (ref 3.70–5.45)
RDW: 15.7 % — AB (ref 11.2–14.5)
WBC: 8 10*3/uL (ref 3.9–10.3)
lymph#: 1.9 10*3/uL (ref 0.9–3.3)

## 2016-03-03 NOTE — Telephone Encounter (Signed)
Appointments scheduled per 11/1 LOS. Patient given AVS report and calendars with future scheduled appointments.  °

## 2016-03-03 NOTE — Progress Notes (Signed)
[place] OFFICE PROGRESS NOTE  Melissa Frees, MD [address] [place] [address]  DIAGNOSIS: Malignant neoplasm of lower-outer quadrant of left breast of female, estrogen receptor positive ([place]) - Plan: CBC with Differential, Comprehensive metabolic panel  Essential hypertension    Oncology History   Breast cancer, left breast   Staging form: Breast, AJCC 7th Edition     Clinical stage from 12/07/2011: Stage IA (T1, N0, cM0) - Signed by Haywood Lasso, MD on 12/07/2011       Prognostic indicators: ER = 100%  PR = 100%  Ki67 = 12%  Her 2 equivocal on BX, 1.26 (no amp) on lumpectomy      Pathologic: Stage IA (T1c, N0, cM0) - Signed by Haywood Lasso, MD on 01/24/2012       Prognostic indicators: ER = 100%  PR = 100%  Ki67 = 12%  Her 2 equivocal on BX, 1.26 (no amp) on lumpectomy        Breast cancer of lower-outer quadrant of left female breast ([place])   12/07/2011 Initial Diagnosis    Breast cancer, left breast      01/02/2012 Surgery    lumpectomy        - 05/02/2012 Radiation Therapy    35 radiation treatments under the direction of Dr. Sondra Come that was completed on 05/02/2012.       05/06/2012 -  Anti-estrogen oral therapy    Arimidex 1 mg daily       Chief complain: follow up Breast cancer  CURRENT THERAPY: Arimidex 1 mg by mouth daily for an anticipated 5 years. Arimidex was started on 05/06/2012.  INTERVAL HISTORY: Pt returns for follow up. She is doing well overall. She had knee replacement and has mild intermittent pain, for which she takes tramadol, but not very often. She otherwise denies any other new pain, or other symptoms. She has good appetite, remains to be physically active at home. She has chronic leg edema, for which she takes Lasix.  MEDICAL HISTORY: Past Medical History:  Diagnosis Date  . Allergy    seasonal allergies  . Arthritis    knee and hands  . Breast cancer, left breast ([place]) 12/07/2011  .  Breast cancer, left breast ([place]) 11/24/11   left breast 4 o'clock bx=invasive ductal ca,grade II,ER/PR=+  . Diverticulitis   . Full dentures   . GERD (gastroesophageal reflux disease)   . HOH (hard of hearing)    right ear   . Hx of radiation therapy 03/14/12- 05/02/12   left breast 5040 gray in 28 fx, site of presentation lower outer quadrant boosted to 6440 gray  . Hyperlipemia   . Hypertension   . Left knee DJD   . PONV (postoperative nausea and vomiting)    has used scop patch-that helped  . Reflux   . Renal insufficiency 11/09/2011  . Shortness of breath   . Wears glasses     INTERIM HISTORY: has Leucocytosis; Renal insufficiency; Gout; Breast cancer of lower-outer quadrant of left female breast ([place]); Hx of radiation therapy; Osteoporosis; PONV (postoperative nausea and vomiting); GERD (gastroesophageal reflux disease); Hypertension; Left knee DJD; DJD (degenerative joint disease) of knee; and UTI (urinary tract infection) Klebsiella on her problem list.    ALLERGIES:  is allergic to hydromorphone hcl; nsaids; and prednisone.  MEDICATIONS: has a current medication list which includes the following prescription(s): acetaminophen, alendronate, allopurinol, anastrozole, colchicine, diphenhydramine-acetaminophen, esomeprazole, losartan, polycarbophil, potassium chloride sa, proair hfa, simvastatin, solifenacin,  and tramadol.  SURGICAL HISTORY:  Past Surgical History:  Procedure Laterality Date  . ABDOMINAL HYSTERECTOMY  93   total  . BLADDER SUSPENSION    . BREAST LUMPECTOMY  01/04/2012   left,invasive ductcl ca, grade II,lloq, lymphovascular and perineural invaion ,DCIS,  . CHOLECYSTECTOMY    . EYE SURGERY     both cataracts  . FRACTURE SURGERY     lt foot  . KNEE SURGERY  2000   rt   . TONSILLECTOMY    . TOTAL KNEE ARTHROPLASTY Left 12/24/2013   Procedure: LEFT TOTAL KNEE ARTHROPLASTY;  Surgeon: Lorn Junes, MD;  Location: [facility];  Service: Orthopedics;  Laterality: Left;   . TUBAL LIGATION     PROBLEM LIST:  1. Stage I, T1c N0 M0, 1.3 cm. invasive ductal carcinoma in situ, grade 2. The tumor was ER 100%, PR 100% positive, HER- 2/neu negative. HER-2/neu was negative by FISH. Ki67 proliferation marker was 14%. There was evidence of lymphovascular and perineural invasion. The tumor was 2 mm from the nearest margin inferiorly. None of the 3 lymph nodes sampled had evidence of tumor. She underwent a lumpectomy with sentinel node dissection on 01/04/2012. Radiation treatments to the left breast were administered by Dr. Gery Pray from 03/14/2012 to 05/02/2012. The patient received 5040 cGy in 28 fractions plus a boost, for a total of 6440 cGy in 35 fractions. Genetic testing was carried out with the BreastNext gene panel through [employer]. No cancer causing mutations were detected in any of the 18 genes tested. She began taking Arimidex 1 mg PO daily on 05/06/2012.  2. Overactive bladder  3. Gout  4. Hyperlipidemia  5. 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 HTN  7. Osteopenia  8. Arthritis  9. Impaired renal functioning  10. GERD  11. Sigmoid diverticulosis requiring admission in August 2012.  REVIEW OF SYSTEMS:   Constitutional: Denies fevers, chills or abnormal weight loss Eyes: Denies blurriness of vision Ears, nose, mouth, throat, and face: Denies mucositis or sore throat Respiratory: Denies cough, dyspnea or wheezes Cardiovascular: Denies palpitation, chest discomfort or lower extremity swelling Gastrointestinal:  Denies nausea, heartburn or change in bowel habits Skin: Denies abnormal skin rashes Lymphatics: Denies new lymphadenopathy or easy bruising Neurological:Denies numbness, tingling or new weaknesses Behavioral/Psych: Mood is stable, no new changes  All other systems were reviewed with the patient and are negative.  PHYSICAL EXAMINATION: ECOG PERFORMANCE STATUS: 0 - Asymptomatic  Blood pressure (!) 172/70, pulse 77, temperature 98 F (36.7  C), temperature source Oral, resp. rate 18, height 5' 4"  (1.626 m), weight 173 lb 9.6 oz (78.7 kg), SpO2 100 %.  GENERAL:alert, no distress and comfortable elderly female who is easily mobile.  SKIN: skin color, texture, turgor are normal, no rashes or significant lesions EYES: normal, Conjunctiva are pink and non-injected, sclera clear OROPHARYNX:no exudate, no erythema and lips, buccal mucosa, and tongue normal  NECK: supple, thyroid normal size, non-tender, without nodularity LYMPH:  no palpable lymphadenopathy in the cervical, axillary or supraclavicular LUNGS: clear to auscultation and percussion with normal breathing effort HEART: regular rate & rhythm and no murmurs and no lower extremity edema BREASTS: Right breast is normal; left breast slightly smaller than the right breast. Well-healed surgical scar. There were no suspicious findings or masses or dimpling or nipple inversion or induration.  ABDOMEN:abdomen soft, non-tender and normal bowel sounds Musculoskeletal:no cyanosis of digits and no clubbing  NEURO: alert & oriented x 3 with fluent speech, no focal motor/sensory deficits  LABORATORY DATA: CBC Latest Ref Rng & Units 03/03/2016 09/01/2015 04/03/2015  WBC 3.9 - 10.3 10e3/uL 8.0 8.2 7.3  Hemoglobin 11.6 - 15.9 g/dL 13.0 13.2 12.2  Hematocrit 34.8 - 46.6 % 39.6 40.8 37.2  Platelets 145 - 400 10e3/uL 215 235 219    CMP Latest Ref Rng & Units 09/01/2015 04/03/2015 10/03/2014  Glucose 70 - 140 mg/dl 97 93 99  BUN 7.0 - 26.0 mg/dL 26.5(H) 25.0 23.2  Creatinine 0.6 - 1.1 mg/dL 1.4(H) 1.3(H) 1.1  Sodium 136 - 145 mEq/L 142 142 142  Potassium 3.5 - 5.1 mEq/L 4.0 4.3 4.0  Chloride 96 - 112 mmol/L - - -  CO2 22 - 29 mEq/L 24 25 25   Calcium 8.4 - 10.4 mg/dL 9.7 9.5 9.1  Total Protein 6.4 - 8.3 g/dL 6.6 6.3(L) 6.3(L)  Total Bilirubin 0.20 - 1.20 mg/dL 0.54 0.55 0.47  Alkaline Phos 40 - 150 U/L 69 70 74  AST 5 - 34 U/L 17 15 19   ALT 0 - 55 U/L 14 <9 12      RADIOGRAPHIC  STUDIES: Mammogram 03/11/2015 IMPRESSION: Stable left breast lumpectomy site. No evidence of malignancy in the bilateral breasts.  RECOMMENDATION: Diagnostic mammogram is suggested in 1 year. (Code:DM-B-01Y)   ASSESSMENT: Melissa Morton 82 y.o. female with a history of Malignant neoplasm of lower-outer quadrant of left breast of female, estrogen receptor positive ([place]) - Plan: CBC with Differential, Comprehensive metabolic panel  Essential hypertension  PLAN:  1.  Breast cancer of lower-outer quadrant of left female breast, T1cN0, stage I, ER+/PR+, HER2- -- status post lumpectomy and radiation treatments, currently on adjuvant anastrozole. -She is clinically doing very well, physical exam and her last mammogram showed no evidence of recurrence. --Continue Arimidex 1 mg by mouth daily. She is tolerating very well. This was started on 05/06/2012, plan for a total of 5 years  -We'll continue breast cancer surveillance with annual mammogram, self exam and routine follow-up. -Lab reviewed, her CBC is normal. CMP still pending. -She is due for mammogram, she is scheduled for next week -I encouraged her to continue healthy diet and regular exercise. She remains to be physically active.  2. osteopenia -I reviewed her bone density scan results from 2015. She has osteopenia. -Continue calcium  and vitamin D, she is also taking Fosamax, tolerating well. -She will follow-up bone density scan with her primary care physician every 2 years.  3. Hypertension, arthritis -She'll continue follow-up with her primary care physician, every 6 months. -Her blood pressure has been pretty high during her visit in our office, she states it's much better at home. I encouraged her to monitor at home.  4. Neck pain -Likely muscular in nature, she will follow-up with her primary care physician, she has an appointment soon.  4. Follow-up. -Return to clinic in 6 months with lab  I spent 15 minutes counseling  the patient face to face. The total time spent in the appointment was 20 minutes.    Truitt Merle, MD 03/03/16 10:16 AM

## 2016-03-16 ENCOUNTER — Ambulatory Visit
Admission: RE | Admit: 2016-03-16 | Discharge: 2016-03-16 | Disposition: A | Discharge status: Home / Self Care | Payer: Medicare Other | Source: Ambulatory Visit | Attending: Hematology | Admitting: Hematology

## 2016-03-16 DIAGNOSIS — Z853 Personal history of malignant neoplasm of breast: Secondary | ICD-10-CM

## 2016-05-07 ENCOUNTER — Other Ambulatory Visit: Payer: Self-pay | Admitting: Hematology

## 2016-05-07 DIAGNOSIS — C50919 Malignant neoplasm of unspecified site of unspecified female breast: Secondary | ICD-10-CM

## 2016-08-30 NOTE — Progress Notes (Signed)
[place] OFFICE PROGRESS NOTE  [name], MD [address] [place] [address]  DIAGNOSIS: Malignant neoplasm of lower-outer quadrant of left breast of female, estrogen receptor positive ([place]) - Plan: US Abdomen Complete    Oncology History   Breast cancer, left breast   Staging form: Breast, AJCC 7th Edition     Clinical stage from 12/07/2011: Stage IA (T1, N0, cM0) - Signed by Haywood Lasso, MD on 12/07/2011       Prognostic indicators: ER = 100%  PR = 100%  Ki67 = 12%  Her 2 equivocal on BX, 1.26 (no amp) on lumpectomy      Pathologic: Stage IA (T1c, N0, cM0) - Signed by Haywood Lasso, MD on 01/24/2012       Prognostic indicators: ER = 100%  PR = 100%  Ki67 = 12%  Her 2 equivocal on BX, 1.26 (no amp) on lumpectomy        Breast cancer of lower-outer quadrant of left female breast ([place])   12/07/2011 Initial Diagnosis    Breast cancer, left breast      01/02/2012 Surgery    lumpectomy       03/14/2012 - 05/02/2012 Radiation Therapy    35 radiation treatments under the direction of Dr. Sondra Come that was completed on        05/06/2012 -  Anti-estrogen oral therapy    Arimidex 1 mg daily       Chief complain: follow up Breast cancer  CURRENT THERAPY: Arimidex 1 mg by mouth daily for an anticipated 5 years. Arimidex was started on 05/06/2012.  INTERVAL HISTORY: Pt returns for follow up with her daughter. She is doing well overall. Her daughter reports pain in her knee based on her left knee replacement. She reports back pain when she stands still for very long. She reports her last year's bone density scan was done at [facility]. She reports her BP is good and steady at home but when at clinic her BP is high. She reports her sister and her aunt had breast cancer. She reports she does home self breast exams regularly. She reports she does not drink.   MEDICAL HISTORY: Past Medical History:  Diagnosis Date  . Allergy    seasonal  allergies  . Arthritis    knee and hands  . Breast cancer, left breast ([place]) 12/07/2011  . Breast cancer, left breast ([place]) 11/24/11   left breast 4 o'clock bx=invasive ductal ca,grade II,ER/PR=+  . Diverticulitis   . Full dentures   . GERD (gastroesophageal reflux disease)   . HOH (hard of hearing)    right ear   . Hx of radiation therapy 03/14/12- 05/02/12   left breast 5040 gray in 28 fx, site of presentation lower outer quadrant boosted to 6440 gray  . Hyperlipemia   . Hypertension   . Left knee DJD   . PONV (postoperative nausea and vomiting)    has used scop patch-that helped  . Reflux   . Renal insufficiency 11/09/2011  . Shortness of breath   . Wears glasses     INTERIM HISTORY: has Leucocytosis; Renal insufficiency; Gout; Breast cancer of lower-outer quadrant of left female breast ([facility]); Hx of radiation therapy; Osteoporosis; PONV (postoperative nausea and vomiting); GERD (gastroesophageal reflux disease); Hypertension; Left knee DJD; DJD (degenerative joint disease) of knee; and UTI (urinary tract infection) Klebsiella on her problem list.    ALLERGIES:  is allergic to hydromorphone hcl; nsaids; and prednisone.  MEDICATIONS: has a current medication list which includes the following prescription(s): acetaminophen, alendronate, allopurinol, anastrozole, colchicine, diphenhydramine-acetaminophen, esomeprazole, losartan, polycarbophil, potassium chloride sa, proair hfa, simvastatin, solifenacin, and tramadol.  SURGICAL HISTORY:  Past Surgical History:  Procedure Laterality Date  . ABDOMINAL HYSTERECTOMY  93   total  . BLADDER SUSPENSION    . BREAST LUMPECTOMY  01/04/2012   left,invasive ductcl ca, grade II,lloq, lymphovascular and perineural invaion ,DCIS,  . CHOLECYSTECTOMY    . EYE SURGERY     both cataracts  . FRACTURE SURGERY     lt foot  . KNEE SURGERY  2000   rt   . TONSILLECTOMY    . TOTAL KNEE ARTHROPLASTY Left 12/24/2013   Procedure: LEFT TOTAL KNEE  ARTHROPLASTY;  Surgeon: Lorn Junes, MD;  Location: [place];  Service: Orthopedics;  Laterality: Left;  . TUBAL LIGATION     PROBLEM LIST:  1. Stage I, T1c N0 M0, 1.3 cm. invasive ductal carcinoma in situ, grade 2. The tumor was ER 100%, PR 100% positive, HER- 2/neu negative. HER-2/neu was negative by FISH. Ki67 proliferation marker was 14%. There was evidence of lymphovascular and perineural invasion. The tumor was 2 mm from the nearest margin inferiorly. None of the 3 lymph nodes sampled had evidence of tumor. She underwent a lumpectomy with sentinel node dissection on 01/04/2012. Radiation treatments to the left breast were administered by Dr. Gery Pray from 03/14/2012 to 05/02/2012. The patient received 5040 cGy in 28 fractions plus a boost, for a total of 6440 cGy in 35 fractions. Genetic testing was carried out with the BreastNext gene panel through [employer]. No cancer causing mutations were detected in any of the 18 genes tested. She began taking Arimidex 1 mg PO daily on 05/06/2012.  2. Overactive bladder  3. Gout  4. Hyperlipidemia  5. 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 HTN  7. Osteopenia  8. Arthritis  9. Impaired renal functioning  10. GERD  11. Sigmoid diverticulosis requiring admission in August 2012.  REVIEW OF SYSTEMS:  Constitutional: Denies fevers, chills or abnormal weight loss Eyes: Denies blurriness of vision Ears, nose, mouth, throat, and face: Denies mucositis or sore throat Respiratory: Denies cough, dyspnea or wheezes Cardiovascular: Denies palpitation, chest discomfort or lower extremity swelling Gastrointestinal:  Denies nausea, heartburn or change in bowel habits Musculoskeletal:(+) still slight knee pain since knee replacement and slight back pain when standing still  Skin: Denies abnormal skin rashes Lymphatics: Denies new lymphadenopathy or easy bruising Neurological:Denies numbness, tingling or new weaknesses Behavioral/Psych: Mood is stable, no new  changes  All other systems were reviewed with the patient and are negative.  PHYSICAL EXAMINATION: ECOG PERFORMANCE STATUS: 0 - Asymptomatic  Blood pressure (!) 183/75, pulse 75, temperature 97.8 F (36.6 C), temperature source Oral, resp. rate 19, height _0  (1.626 m), weight 168 lb 3.2 oz (76.3 kg), SpO2 100 %.  GENERAL:alert, no distress and comfortable elderly female who is easily mobile.  SKIN: skin color, texture, turgor are normal, no rashes or significant lesions EYES: normal, Conjunctiva are pink and non-injected, sclera clear OROPHARYNX:no exudate, no erythema and lips, buccal mucosa, and tongue normal  NECK: supple, thyroid normal size, non-tender, without nodularity LYMPH:  no palpable lymphadenopathy in the cervical, axillary or supraclavicular LUNGS: clear to auscultation and percussion with normal breathing effort HEART: regular rate & rhythm and no murmurs and no lower extremity edema BREASTS: Right breast is normal; left breast slightly smaller than the right breast. Well-healed surgical scar but  tender. There were no suspicious findings or masses or dimpling or nipple inversion or induration.  ABDOMEN:abdomen soft, normal bowel sounds. (+) Tenderness in RUQ but no hepatomegaly. No other tenderness or palpable mass  Musculoskeletal:no cyanosis of digits and no clubbing  NEURO: alert & oriented x 3 with fluent speech, no focal motor/sensory deficits  LABORATORY DATA: CBC Latest Ref Rng & Units 08/31/2016 03/03/2016 09/01/2015  WBC 3.9 - 10.3 10e3/uL 9.4 8.0 8.2  Hemoglobin 11.6 - 15.9 g/dL 12.8 13.0 13.2  Hematocrit 34.8 - 46.6 % 38.7 39.6 40.8  Platelets 145 - 400 10e3/uL 229 215 235    CMP Latest Ref Rng & Units 08/31/2016 03/03/2016 09/01/2015  Glucose 70 - 140 mg/dl 106 113 97  BUN 7.0 - 26.0 mg/dL 22.9 21.6 26.5(H)  Creatinine 0.6 - 1.1 mg/dL 1.4(H) 1.3(H) 1.4(H)  Sodium 136 - 145 mEq/L 144 144 142  Potassium 3.5 - 5.1 mEq/L 4.3 3.5 4.0  Chloride 96 - 112 mmol/L - - -   CO2 22 - 29 mEq/L _0 Calcium 8.4 - 10.4 mg/dL 9.8 9.2 9.7  Total Protein 6.4 - 8.3 g/dL 6.9 6.6 6.6  Total Bilirubin 0.20 - 1.20 mg/dL 0.87 0.46 0.54  Alkaline Phos 40 - 150 U/L 94 87 69  AST 5 - 34 U/L _1 ALT 0 - 55 U/L _2 RADIOGRAPHIC STUDIES: Mammogram 03/16/2016 IIMPRESSION: Stable left breast lumpectomy site. No mammographic evidence of malignancy in the bilateral breasts.  RECOMMENDATION: Diagnostic mammogram is suggested in 1 year. (Code:DM-B-01YIMPRESSION: Stable left breast lumpectomy site. No mammographic evidence of malignancy in the bilateral breasts.  RECOMMENDATION: Diagnostic mammogram is suggested in 1 year. (Code:DM-B-01Y)   ASSESSMENT: Melissa Morton 83 y.o. female with a history of Malignant neoplasm of lower-outer quadrant of left breast of female, estrogen receptor positive ([place]) - Plan: US Abdomen Complete  PLAN:  1.  Breast cancer of lower-outer quadrant of left female breast, T1cN0, stage I, ER+/PR+, HER2- -- status post lumpectomy and radiation treatments, currently on adjuvant anastrozole. --Continue Arimidex 1 mg by mouth daily. She is tolerating very well. This was started on 05/06/2012, plan for a total of 5 years -We'll continue breast cancer surveillance with annual mammogram, self exam and routine follow-up. -Lab previously reviewed, her CBC and CMP is normal.  -She is clinically doing well. Her exam was unremarkable, except RUQ abdominal tenderness, will get a abdominal US for further evaluation  -I previously encouraged her to continue healthy diet and regular exercise. She remains to be physically active. -She is due for mammogram in Nov   2. osteopenia -I previously reviewed her bone density scan results from 2015. She has osteopenia. -Continue calcium  and vitamin D, she is also taking Fosamax, tolerating well. -She will follow-up bone density scan with her primary care physician every 2 years.  -Will  Request  her her latest Bone density scan report from [facility]  3. Hypertension, arthritis -She'll continue follow-up with her primary care physician, every 6 months. -Her blood pressure has been pretty high during her visit in our office, she states it's much better at home. I encouraged her to monitor at home.  4. RUQ abdominal tenderness  -During PE she had tenderness in the liver area,without hepatomegaly. Abdomen soft.   -Will schedule [place] for next week for further evaluation  -her liver function is normal today  -Will f/u sooner based on [place] results   PLAN: -She will continue  Arimidex  -Will continue breast mammogram every year, due in Nov  -request bone density scan from [facility] -Will order ABD [place] to check liver this week, I will call her after her [place]  -Will f/u with lab 6 months, sooner if [place] abnormal     I spent 20 minutes counseling the patient face to face. The total time spent in the appointment was 25 minutes.    Truitt Merle, MD 08/31/2016   This document serves as a record of services personally performed by Truitt Merle, MD. It was created on her behalf by Joslyn Devon, a trained medical scribe. The creation of this record is based on the scribe's personal observations and the provider's statements to them. This document has been checked and approved by the attending provider.

## 2016-08-31 ENCOUNTER — Telehealth: Payer: Self-pay | Admitting: Hematology

## 2016-08-31 ENCOUNTER — Encounter: Payer: Self-pay | Admitting: Hematology

## 2016-08-31 ENCOUNTER — Ambulatory Visit (HOSPITAL_BASED_OUTPATIENT_CLINIC_OR_DEPARTMENT_OTHER): Payer: Medicare Other | Admitting: Hematology

## 2016-08-31 ENCOUNTER — Other Ambulatory Visit (HOSPITAL_BASED_OUTPATIENT_CLINIC_OR_DEPARTMENT_OTHER): Payer: Medicare Other

## 2016-08-31 VITALS — BP 183/75 | HR 75 | Temp 97.8°F | Resp 19 | Ht 64.0 in | Wt 168.2 lb

## 2016-08-31 DIAGNOSIS — Z170 Estrogen receptor positive status [ER+]: Principal | ICD-10-CM

## 2016-08-31 DIAGNOSIS — C50512 Malignant neoplasm of lower-outer quadrant of left female breast: Secondary | ICD-10-CM

## 2016-08-31 DIAGNOSIS — R10811 Right upper quadrant abdominal tenderness: Secondary | ICD-10-CM

## 2016-08-31 DIAGNOSIS — M8580 Other specified disorders of bone density and structure, unspecified site: Secondary | ICD-10-CM

## 2016-08-31 DIAGNOSIS — Z79811 Long term (current) use of aromatase inhibitors: Secondary | ICD-10-CM | POA: Diagnosis not present

## 2016-08-31 LAB — COMPREHENSIVE METABOLIC PANEL
ALT: 8 U/L (ref 0–55)
ANION GAP: 10 meq/L (ref 3–11)
AST: 15 U/L (ref 5–34)
Albumin: 3.9 g/dL (ref 3.5–5.0)
Alkaline Phosphatase: 94 U/L (ref 40–150)
BUN: 22.9 mg/dL (ref 7.0–26.0)
CHLORIDE: 107 meq/L (ref 98–109)
CO2: 27 meq/L (ref 22–29)
Calcium: 9.8 mg/dL (ref 8.4–10.4)
Creatinine: 1.4 mg/dL — ABNORMAL HIGH (ref 0.6–1.1)
EGFR: 36 mL/min/{1.73_m2} — ABNORMAL LOW (ref >90)
Glucose: 106 mg/dl (ref 70–140)
Potassium: 4.3 mEq/L (ref 3.5–5.1)
Sodium: 144 mEq/L (ref 136–145)
Total Bilirubin: 0.87 mg/dL (ref 0.20–1.20)
Total Protein: 6.9 g/dL (ref 6.4–8.3)

## 2016-08-31 LAB — CBC WITH DIFFERENTIAL/PLATELET
BASO%: 0.5 % (ref 0.0–2.0)
BASOS ABS: 0.1 10*3/uL (ref 0.0–0.1)
EOS ABS: 0.2 10*3/uL (ref 0.0–0.5)
EOS%: 2 % (ref 0.0–7.0)
HCT: 38.7 % (ref 34.8–46.6)
HGB: 12.8 g/dL (ref 11.6–15.9)
LYMPH%: 20.2 % (ref 14.0–49.7)
MCH: 29.8 pg (ref 25.1–34.0)
MCHC: 33 g/dL (ref 31.5–36.0)
MCV: 90.4 fL (ref 79.5–101.0)
MONO#: 0.7 10*3/uL (ref 0.1–0.9)
MONO%: 7.4 % (ref 0.0–14.0)
NEUT#: 6.5 10*3/uL (ref 1.5–6.5)
NEUT%: 69.9 % (ref 38.4–76.8)
PLATELETS: 229 10*3/uL (ref 145–400)
RBC: 4.28 10*6/uL (ref 3.70–5.45)
RDW: 15.4 % — ABNORMAL HIGH (ref 11.2–14.5)
WBC: 9.4 10*3/uL (ref 3.9–10.3)
lymph#: 1.9 10*3/uL (ref 0.9–3.3)

## 2016-08-31 NOTE — Telephone Encounter (Signed)
Gave patient avs report and appointments for August. [facility] radiology will contact patient re [place] once order has been entered.

## 2016-09-01 ENCOUNTER — Encounter: Payer: Self-pay | Admitting: Hematology

## 2016-09-07 ENCOUNTER — Ambulatory Visit (HOSPITAL_COMMUNITY)
Admission: RE | Admit: 2016-09-07 | Discharge: 2016-09-07 | Disposition: A | Discharge status: Home / Self Care | Payer: Medicare Other | Source: Ambulatory Visit | Attending: Hematology | Admitting: Hematology

## 2016-09-07 DIAGNOSIS — Z170 Estrogen receptor positive status [ER+]: Secondary | ICD-10-CM | POA: Diagnosis not present

## 2016-09-07 DIAGNOSIS — Z9049 Acquired absence of other specified parts of digestive tract: Secondary | ICD-10-CM | POA: Diagnosis not present

## 2016-09-07 DIAGNOSIS — I700 Atherosclerosis of aorta: Secondary | ICD-10-CM | POA: Insufficient documentation

## 2016-09-07 DIAGNOSIS — C50512 Malignant neoplasm of lower-outer quadrant of left female breast: Secondary | ICD-10-CM | POA: Diagnosis present

## 2016-09-13 ENCOUNTER — Telehealth: Payer: Self-pay | Admitting: *Deleted

## 2016-09-13 NOTE — Telephone Encounter (Signed)
-----   Message from Truitt Merle, MD sent at 09/12/2016  5:58 PM EDT ----- Please let her know the [place] result, which is negative. Please let pt call us if her abdominal tenderness does not resolve. Thanks.   Truitt Merle  09/12/2016

## 2016-09-13 NOTE — Telephone Encounter (Signed)
Spoke with pt and informed pt of [place] result - negative as per Dr. Burr Medico.  Instructed pt to call office back if abdominal tenderness does not resolve.  Pt voiced understanding.

## 2016-11-02 ENCOUNTER — Other Ambulatory Visit: Payer: Self-pay | Admitting: *Deleted

## 2016-11-02 MED ORDER — ANASTROZOLE 1 MG PO TABS: 1.0000 mg | ORAL_TABLET | Freq: Every day | ORAL | 1 refills | Status: DC

## 2016-11-08 ENCOUNTER — Other Ambulatory Visit: Payer: Self-pay | Admitting: *Deleted

## 2016-11-08 MED ORDER — ANASTROZOLE 1 MG PO TABS: 1.0000 mg | ORAL_TABLET | Freq: Every day | ORAL | 1 refills | Status: DC

## 2016-12-02 NOTE — Progress Notes (Addendum)
[place] OFFICE PROGRESS NOTE  [name], MD [address] [place] [address]  DIAGNOSIS: Malignant neoplasm of lower-outer quadrant of left breast of female, estrogen receptor positive ([place]) - Plan: MM DIAG BREAST TOMO BILATERAL    Oncology History   Breast cancer, left breast   Staging form: Breast, AJCC 7th Edition     Clinical stage from 12/07/2011: Stage IA (T1, N0, cM0) - Signed by Haywood Lasso, MD on 12/07/2011       Prognostic indicators: ER = 100%  PR = 100%  Ki67 = 12%  Her 2 equivocal on BX, 1.26 (no amp) on lumpectomy      Pathologic: Stage IA (T1c, N0, cM0) - Signed by Haywood Lasso, MD on 01/24/2012       Prognostic indicators: ER = 100%  PR = 100%  Ki67 = 12%  Her 2 equivocal on BX, 1.26 (no amp) on lumpectomy        Breast cancer of lower-outer quadrant of left female breast ([place])   12/07/2011 Initial Diagnosis    Breast cancer, left breast      01/02/2012 Surgery    lumpectomy       03/14/2012 - 05/02/2012 Radiation Therapy    35 radiation treatments under the direction of Dr. Sondra Come that was completed on        05/06/2012 -  Anti-estrogen oral therapy    Arimidex 1 mg daily      03/16/2016 Mammogram    IMPRESSION: Stable left breast lumpectomy site. No mammographic evidence of malignancy in the bilateral breasts.      09/07/2016 Imaging    US Abdomen Complete IMPRESSION: Post cholecystectomy.  Common bile duct and liver unremarkable.  Slightly prominent pancreatic duct unchanged from 2013. No pancreatic mass identified (tail not visualized secondary to bowel gas).  Mild bilateral renal parenchymal thinning without hydronephrosis.  Aortic atherosclerosis.       Chief complain: follow up Breast cancer  CURRENT THERAPY: Arimidex 1 mg by mouth daily for an anticipated 5 years. Arimidex was started on 05/06/2012.  INTERVAL HISTORY: Pt returns for follow up with her daughter. She has been  doing well and denies any changes since our last visit. Her previous abdominal pain has now resolved and she denies any stomach issues, abnormal bowel movements or nausea. She denies nay side effects from Arimidex. She has been doing well overall.    MEDICAL HISTORY: Past Medical History:  Diagnosis Date  . Allergy    seasonal allergies  . Arthritis    knee and hands  . Breast cancer, left breast ([place]) 12/07/2011  . Breast cancer, left breast ([place]) 11/24/11   left breast 4 o'clock bx=invasive ductal ca,grade II,ER/PR=+  . Diverticulitis   . Full dentures   . GERD (gastroesophageal reflux disease)   . HOH (hard of hearing)    right ear   . Hx of radiation therapy 03/14/12- 05/02/12   left breast 5040 gray in 28 fx, site of presentation lower outer quadrant boosted to 6440 gray  . Hyperlipemia   . Hypertension   . Left knee DJD   . PONV (postoperative nausea and vomiting)    has used scop patch-that helped  . Reflux   . Renal insufficiency 11/09/2011  . Shortness of breath   . Wears glasses     INTERIM HISTORY: has Leucocytosis; Renal insufficiency; Gout; Breast cancer of lower-outer quadrant of left female breast ([place]); Hx of radiation therapy; Osteoporosis; PONV (  postoperative nausea and vomiting); GERD (gastroesophageal reflux disease); Hypertension; Left knee DJD; DJD (degenerative joint disease) of knee; and UTI (urinary tract infection) Klebsiella on her problem list.    ALLERGIES:  is allergic to hydromorphone hcl; nsaids; and prednisone.  MEDICATIONS: has a current medication list which includes the following prescription(s): acetaminophen, alendronate, allopurinol, anastrozole, colchicine, diphenhydramine-acetaminophen, esomeprazole, losartan, polycarbophil, potassium chloride sa, proair hfa, simvastatin, solifenacin, and tramadol.  SURGICAL HISTORY:  Past Surgical History:  Procedure Laterality Date  . ABDOMINAL HYSTERECTOMY  93   total  . BLADDER SUSPENSION    . BREAST  LUMPECTOMY  01/04/2012   left,invasive ductcl ca, grade II,lloq, lymphovascular and perineural invaion ,DCIS,  . CHOLECYSTECTOMY    . EYE SURGERY     both cataracts  . FRACTURE SURGERY     lt foot  . KNEE SURGERY  2000   rt   . TONSILLECTOMY    . TOTAL KNEE ARTHROPLASTY Left 12/24/2013   Procedure: LEFT TOTAL KNEE ARTHROPLASTY;  Surgeon: Lorn Junes, MD;  Location: [place];  Service: Orthopedics;  Laterality: Left;  . TUBAL LIGATION     PROBLEM LIST:  1. Stage I, T1c N0 M0, 1.3 cm. invasive ductal carcinoma in situ, grade 2. The tumor was ER 100%, PR 100% positive, HER- 2/neu negative. HER-2/neu was negative by FISH. Ki67 proliferation marker was 14%. There was evidence of lymphovascular and perineural invasion. The tumor was 2 mm from the nearest margin inferiorly. None of the 3 lymph nodes sampled had evidence of tumor. She underwent a lumpectomy with sentinel node dissection on 01/04/2012. Radiation treatments to the left breast were administered by Dr. Gery Pray from 03/14/2012 to 05/02/2012. The patient received 5040 cGy in 28 fractions plus a boost, for a total of 6440 cGy in 35 fractions. Genetic testing was carried out with the BreastNext gene panel through [employer]. No cancer causing mutations were detected in any of the 18 genes tested. She began taking Arimidex 1 mg PO daily on 05/06/2012.  2. Overactive bladder  3. Gout  4. Hyperlipidemia  5. 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 HTN  7. Osteopenia  8. Arthritis  9. Impaired renal functioning  10. GERD  11. Sigmoid diverticulosis requiring admission in August 2012.  REVIEW OF SYSTEMS:  Constitutional: Denies fevers, chills or abnormal weight loss Eyes: Denies blurriness of vision Ears, nose, mouth, throat, and face: Denies mucositis or sore throat Respiratory: Denies cough, dyspnea or wheezes Cardiovascular: Denies palpitation, chest discomfort or lower extremity swelling Gastrointestinal:  Denies nausea, heartburn or  change in bowel habits Skin: Denies abnormal skin rashes Lymphatics: Denies new lymphadenopathy or easy bruising Neurological:Denies numbness, tingling or new weaknesses Behavioral/Psych: Mood is stable, no new changes  All other systems were reviewed with the patient and are negative.  PHYSICAL EXAMINATION: ECOG PERFORMANCE STATUS: 0 - Asymptomatic  Blood pressure (!) 194/74, pulse 72, temperature 98.7 F (37.1 C), temperature source Oral, resp. rate 18, height '5\' 4"'$  (1.626 m), weight 174 lb 1.6 oz (79 kg), SpO2 99 %.  GENERAL:alert, no distress and comfortable elderly female who is easily mobile.  SKIN: skin color, texture, turgor are normal, no rashes or significant lesions EYES: normal, Conjunctiva are pink and non-injected, sclera clear OROPHARYNX:no exudate, no erythema and lips, buccal mucosa, and tongue normal  NECK: supple, thyroid normal size, non-tender, without nodularity LYMPH:  no palpable lymphadenopathy in the cervical, axillary or supraclavicular LUNGS: clear to auscultation and percussion with normal breathing effort HEART: regular rate &  rhythm and no murmurs and no lower extremity edema BREASTS: Right breast is normal; left breast slightly smaller than the right breast. Well-healed surgical scar but tender. There were no suspicious findings or masses or dimpling or nipple inversion or induration.  ABDOMEN:abdomen soft, normal bowel sounds. (+) resolved abdominal tenderness. No other tenderness or palpable mass  Musculoskeletal:no cyanosis of digits and no clubbing  NEURO: alert & oriented x 3 with fluent speech, no focal motor/sensory deficits  LABORATORY DATA: CBC Latest Ref Rng & Units 12/03/2016 08/31/2016 03/03/2016  WBC 3.9 - 10.3 10e3/uL 8.1 9.4 8.0  Hemoglobin 11.6 - 15.9 g/dL 12.7 12.8 13.0  Hematocrit 34.8 - 46.6 % 39.9 38.7 39.6  Platelets 145 - 400 10e3/uL 236 229 215    CMP Latest Ref Rng & Units 12/03/2016 08/31/2016 03/03/2016  Glucose 70 - 140 mg/dl 106 106  113  BUN 7.0 - 26.0 mg/dL 18.7 22.9 21.6  Creatinine 0.6 - 1.1 mg/dL 1.4(H) 1.4(H) 1.3(H)  Sodium 136 - 145 mEq/L 145 144 144  Potassium 3.5 - 5.1 mEq/L 4.9 4.3 3.5  Chloride 96 - 112 mmol/L - - -  CO2 22 - 29 mEq/L _0 Calcium 8.4 - 10.4 mg/dL 9.9 9.8 9.2  Total Protein 6.4 - 8.3 g/dL 6.3(L) 6.9 6.6  Total Bilirubin 0.20 - 1.20 mg/dL 0.50 0.87 0.46  Alkaline Phos 40 - 150 U/L 81 94 87  AST 5 - 34 U/L _1 ALT 0 - 55 U/L _2 RADIOGRAPHIC STUDIES: US Abdomen Complete 09/07/2016 IMPRESSION: Post cholecystectomy.  Common bile duct and liver unremarkable.  Slightly prominent pancreatic duct unchanged from 2013. No pancreatic mass identified (tail not visualized secondary to bowel gas).  Mild bilateral renal parenchymal thinning without hydronephrosis.  Aortic atherosclerosis.  Mammogram 03/16/2016 IIMPRESSION: Stable left breast lumpectomy site. No mammographic evidence of malignancy in the bilateral breasts.  RECOMMENDATION: Diagnostic mammogram is suggested in 1 year. (Code:DM-B-01YIMPRESSION: Stable left breast lumpectomy site. No mammographic evidence of malignancy in the bilateral breasts.  RECOMMENDATION: Diagnostic mammogram is suggested in 1 year. (Code:DM-B-01Y)   ASSESSMENT: Melissa Morton 83 y.o. female with a history of Malignant neoplasm of lower-outer quadrant of left breast of female, estrogen receptor positive ([place]) - Plan: MM DIAG BREAST TOMO BILATERAL  PLAN:  1.  Breast cancer of lower-outer quadrant of left female breast, T1cN0, stage I, ER+/PR+, HER2- -- status post lumpectomy and radiation treatments, currently on adjuvant anastrozole. --Continue Arimidex 1 mg by mouth daily. She is tolerating very well. This was started on 05/06/2012, plan for a total of 5 years -We'll continue breast cancer surveillance with annual mammogram, self exam and routine follow-up. -Lab previously reviewed, her CBC and CMP is normal.  -She is  clinically doing well. Her exam was unremarkable, Lab reviewed, unremarkable except a stable modestly elevated creatinine, no clinical concern for recurrence. -I previously encouraged her to continue healthy diet and regular exercise. She remains to be physically active. -She is due for mammogram in Nov  - She will complete Anastrozole at the end of this year. - f/u n 6 months with yearly follow ups followng  2. osteopenia -I previously reviewed her bone density scan results from 2015. She has osteopenia. -Continue calcium  and vitamin D, she is also taking Fosamax, tolerating well. -She will follow-up bone density scan with her primary care physician every 2 years.  -Will  Request her her latest Bone density scan report from [facility]  3. Hypertension, arthritis -She'll continue follow-up with her primary care physician, every 6 months. -Her blood pressure has been pretty high during her visit in our office, she states it's much better at home. I encouraged her to monitor at home.  4. RUQ abdominal tenderness  -Resolved now, her abdominal ultrasound was negative in May 2018.   PLAN: -She will continue Arimidex. She will complete at the end of this year - f/u n 6 months with yearly follow ups followng -Annual diagnostic mammogram in November at [facility]   I spent 20 minutes counseling the patient face to face. The total time spent in the appointment was 25 minutes.  This document serves as a record of services personally performed by Truitt Merle, MD. It was created on her behalf by Brandt Loosen, a trained medical scribe. The creation of this record is based on the scribe's personal observations and the provider's statements to them. This document has been checked and approved by the attending provider.  Truitt Merle  12/03/2016   Addendum Received a copy of her last DEXA scan which was done on 04/06/2016: Impression, osteoporosis of left arm. Lowest site measures: Left distal radius, BMD 0.543,  T score -2.5.  Truitt Merle  12/03/2016

## 2016-12-03 ENCOUNTER — Other Ambulatory Visit (HOSPITAL_BASED_OUTPATIENT_CLINIC_OR_DEPARTMENT_OTHER): Payer: Medicare Other

## 2016-12-03 ENCOUNTER — Ambulatory Visit (HOSPITAL_BASED_OUTPATIENT_CLINIC_OR_DEPARTMENT_OTHER): Payer: Medicare Other | Admitting: Hematology

## 2016-12-03 ENCOUNTER — Telehealth: Payer: Self-pay | Admitting: Hematology

## 2016-12-03 ENCOUNTER — Encounter: Payer: Self-pay | Admitting: Hematology

## 2016-12-03 VITALS — BP 194/74 | HR 72 | Temp 98.7°F | Resp 18 | Ht 64.0 in | Wt 174.1 lb

## 2016-12-03 DIAGNOSIS — C50512 Malignant neoplasm of lower-outer quadrant of left female breast: Secondary | ICD-10-CM

## 2016-12-03 DIAGNOSIS — Z170 Estrogen receptor positive status [ER+]: Principal | ICD-10-CM

## 2016-12-03 DIAGNOSIS — Z79811 Long term (current) use of aromatase inhibitors: Secondary | ICD-10-CM

## 2016-12-03 DIAGNOSIS — M8580 Other specified disorders of bone density and structure, unspecified site: Secondary | ICD-10-CM

## 2016-12-03 LAB — COMPREHENSIVE METABOLIC PANEL
ALT: 11 U/L (ref 0–55)
ANION GAP: 8 meq/L (ref 3–11)
AST: 17 U/L (ref 5–34)
Albumin: 3.7 g/dL (ref 3.5–5.0)
Alkaline Phosphatase: 81 U/L (ref 40–150)
BILIRUBIN TOTAL: 0.5 mg/dL (ref 0.20–1.20)
BUN: 18.7 mg/dL (ref 7.0–26.0)
CO2: 28 mEq/L (ref 22–29)
Calcium: 9.9 mg/dL (ref 8.4–10.4)
Chloride: 109 mEq/L (ref 98–109)
Creatinine: 1.4 mg/dL — ABNORMAL HIGH (ref 0.6–1.1)
EGFR: 34 mL/min/{1.73_m2} — AB (ref >90)
Glucose: 106 mg/dl (ref 70–140)
Potassium: 4.9 mEq/L (ref 3.5–5.1)
Sodium: 145 mEq/L (ref 136–145)
TOTAL PROTEIN: 6.3 g/dL — AB (ref 6.4–8.3)

## 2016-12-03 LAB — CBC WITH DIFFERENTIAL/PLATELET
BASO%: 0.6 % (ref 0.0–2.0)
BASOS ABS: 0.1 10*3/uL (ref 0.0–0.1)
EOS ABS: 0.2 10*3/uL (ref 0.0–0.5)
EOS%: 2.7 % (ref 0.0–7.0)
HCT: 39.9 % (ref 34.8–46.6)
HGB: 12.7 g/dL (ref 11.6–15.9)
LYMPH%: 22.8 % (ref 14.0–49.7)
MCH: 29.9 pg (ref 25.1–34.0)
MCHC: 31.8 g/dL (ref 31.5–36.0)
MCV: 93.9 fL (ref 79.5–101.0)
MONO#: 0.7 10*3/uL (ref 0.1–0.9)
MONO%: 8.4 % (ref 0.0–14.0)
NEUT#: 5.3 10*3/uL (ref 1.5–6.5)
NEUT%: 65.5 % (ref 38.4–76.8)
Platelets: 236 10*3/uL (ref 145–400)
RBC: 4.25 10*6/uL (ref 3.70–5.45)
RDW: 15.2 % — ABNORMAL HIGH (ref 11.2–14.5)
WBC: 8.1 10*3/uL (ref 3.9–10.3)
lymph#: 1.9 10*3/uL (ref 0.9–3.3)

## 2016-12-03 NOTE — Telephone Encounter (Signed)
Gave patient avs form and calendar for Feb 2019.   Scheduled mammogram with [facility].

## 2017-01-30 ENCOUNTER — Encounter (HOSPITAL_COMMUNITY): Payer: Self-pay | Admitting: Emergency Medicine

## 2017-01-30 ENCOUNTER — Emergency Department (HOSPITAL_COMMUNITY): Payer: Medicare Other

## 2017-01-30 ENCOUNTER — Emergency Department (HOSPITAL_COMMUNITY)
Admission: EM | Admit: 2017-01-30 | Discharge: 2017-01-31 | Disposition: A | Discharge status: Home / Self Care | Payer: Medicare Other | Attending: Emergency Medicine | Admitting: Emergency Medicine

## 2017-01-30 DIAGNOSIS — I10 Essential (primary) hypertension: Secondary | ICD-10-CM | POA: Insufficient documentation

## 2017-01-30 DIAGNOSIS — Z79899 Other long term (current) drug therapy: Secondary | ICD-10-CM | POA: Diagnosis not present

## 2017-01-30 DIAGNOSIS — R1032 Left lower quadrant pain: Secondary | ICD-10-CM | POA: Diagnosis present

## 2017-01-30 DIAGNOSIS — N132 Hydronephrosis with renal and ureteral calculous obstruction: Secondary | ICD-10-CM | POA: Diagnosis not present

## 2017-01-30 DIAGNOSIS — Z87891 Personal history of nicotine dependence: Secondary | ICD-10-CM | POA: Diagnosis not present

## 2017-01-30 DIAGNOSIS — N201 Calculus of ureter: Secondary | ICD-10-CM

## 2017-01-30 DIAGNOSIS — N12 Tubulo-interstitial nephritis, not specified as acute or chronic: Secondary | ICD-10-CM

## 2017-01-30 DIAGNOSIS — N10 Acute tubulo-interstitial nephritis: Secondary | ICD-10-CM | POA: Diagnosis not present

## 2017-01-30 DIAGNOSIS — Z96652 Presence of left artificial knee joint: Secondary | ICD-10-CM | POA: Diagnosis not present

## 2017-01-30 DIAGNOSIS — Z853 Personal history of malignant neoplasm of breast: Secondary | ICD-10-CM | POA: Insufficient documentation

## 2017-01-30 LAB — CBC
HCT: 41.4 % (ref 36.0–46.0)
Hemoglobin: 13.2 g/dL (ref 12.0–15.0)
MCH: 29.3 pg (ref 26.0–34.0)
MCHC: 31.9 g/dL (ref 30.0–36.0)
MCV: 91.8 fL (ref 78.0–100.0)
Platelets: 255 10*3/uL (ref 150–400)
RBC: 4.51 MIL/uL (ref 3.87–5.11)
RDW: 15.1 % (ref 11.5–15.5)
WBC: 12.8 10*3/uL — ABNORMAL HIGH (ref 4.0–10.5)

## 2017-01-30 LAB — LIPASE, BLOOD: Lipase: 37 U/L (ref 11–51)

## 2017-01-30 LAB — COMPREHENSIVE METABOLIC PANEL
ALK PHOS: 77 U/L (ref 38–126)
ALT: 15 U/L (ref 14–54)
AST: 22 U/L (ref 15–41)
Albumin: 4.3 g/dL (ref 3.5–5.0)
Anion gap: 11 (ref 5–15)
BILIRUBIN TOTAL: 0.6 mg/dL (ref 0.3–1.2)
BUN: 18 mg/dL (ref 6–20)
CO2: 23 mmol/L (ref 22–32)
Calcium: 9.6 mg/dL (ref 8.9–10.3)
Chloride: 106 mmol/L (ref 101–111)
Creatinine, Ser: 1.48 mg/dL — ABNORMAL HIGH (ref 0.44–1.00)
GFR calc Af Amer: 37 mL/min — ABNORMAL LOW (ref >60)
GFR calc non Af Amer: 32 mL/min — ABNORMAL LOW (ref >60)
GLUCOSE: 124 mg/dL — AB (ref 65–99)
POTASSIUM: 3.7 mmol/L (ref 3.5–5.1)
SODIUM: 140 mmol/L (ref 135–145)
TOTAL PROTEIN: 7 g/dL (ref 6.5–8.1)

## 2017-01-30 LAB — URINALYSIS, ROUTINE W REFLEX MICROSCOPIC
Bacteria, UA: NONE SEEN
Bilirubin Urine: NEGATIVE
Glucose, UA: NEGATIVE mg/dL
Ketones, ur: NEGATIVE mg/dL
Nitrite: POSITIVE — AB
Protein, ur: 100 mg/dL — AB
SPECIFIC GRAVITY, URINE: 1.016 (ref 1.005–1.030)
pH: 6 (ref 5.0–8.0)

## 2017-01-30 MED ORDER — IOPAMIDOL (ISOVUE-300) INJECTION 61%
INTRAVENOUS | Status: AC
  Filled 2017-01-30: qty 30

## 2017-01-30 MED ORDER — SODIUM CHLORIDE 0.9 % IV BOLUS (SEPSIS)
1000.0000 mL | Freq: Once | INTRAVENOUS | Status: AC
  Administered 2017-01-30: 1000 mL via INTRAVENOUS

## 2017-01-30 MED ORDER — ONDANSETRON HCL 4 MG/2ML IJ SOLN
4.0000 mg | Freq: Once | INTRAMUSCULAR | Status: AC
  Administered 2017-01-30: 4 mg via INTRAVENOUS
  Filled 2017-01-30: qty 2

## 2017-01-30 MED ORDER — DEXTROSE 5 % IV SOLN
1.0000 g | Freq: Once | INTRAVENOUS | Status: AC
  Administered 2017-01-31: 1 g via INTRAVENOUS
  Filled 2017-01-30: qty 10

## 2017-01-30 MED ORDER — FENTANYL CITRATE (PF) 100 MCG/2ML IJ SOLN
50.0000 ug | Freq: Once | INTRAMUSCULAR | Status: AC
  Administered 2017-01-30: 50 ug via INTRAVENOUS
  Filled 2017-01-30: qty 2

## 2017-01-30 NOTE — ED Provider Notes (Signed)
[facility] DEPT Provider Note   CSN: [id number] Arrival date & time: 01/30/17  1747     History   Chief Complaint Chief Complaint  Patient presents with  . Abdominal Pain    HPI Melissa Morton is a 83 y.o. female.  The history is provided by the patient.  Abdominal Pain   This is a recurrent (similar to prior diverticulitis) problem. The current episode started 6 to 12 hours ago. The problem occurs constantly. The problem has not changed since onset.The pain is located in the LLQ. The quality of the pain is aching. The pain is moderate. Associated symptoms include nausea. Pertinent negatives include fever, diarrhea, hematochezia, melena, vomiting, constipation, dysuria and frequency. The symptoms are aggravated by certain positions and palpation. Nothing relieves the symptoms.    Past Medical History:  Diagnosis Date  . Allergy    seasonal allergies  . Arthritis    knee and hands  . Breast cancer, left breast ([place]) 12/07/2011  . Breast cancer, left breast ([facility]) 11/24/11   left breast 4 o'clock bx=invasive ductal ca,grade II,ER/PR=+  . Diverticulitis   . Full dentures   . GERD (gastroesophageal reflux disease)   . HOH (hard of hearing)    right ear   . Hx of radiation therapy 03/14/12- 05/02/12   left breast 5040 gray in 28 fx, site of presentation lower outer quadrant boosted to 6440 gray  . Hyperlipemia   . Hypertension   . Left knee DJD   . PONV (postoperative nausea and vomiting)    has used scop patch-that helped  . Reflux   . Renal insufficiency 11/09/2011  . Shortness of breath   . Wears glasses     Patient Active Problem List   Diagnosis Date Noted  . UTI (urinary tract infection) Klebsiella 12/25/2013  . DJD (degenerative joint disease) of knee 12/24/2013  . PONV (postoperative nausea and vomiting)   . GERD (gastroesophageal reflux disease)   . Hypertension   . Left knee DJD   . Osteoporosis 03/20/2013  . Hx of radiation therapy   . Breast cancer of  lower-outer quadrant of left female breast ([facility]) 12/07/2011  . Leucocytosis 11/09/2011  . Renal insufficiency 11/09/2011  . Gout 11/09/2011    Past Surgical History:  Procedure Laterality Date  . ABDOMINAL HYSTERECTOMY  93   total  . BLADDER SUSPENSION    . BREAST LUMPECTOMY  01/04/2012   left,invasive ductcl ca, grade II,lloq, lymphovascular and perineural invaion ,DCIS,  . CHOLECYSTECTOMY    . EYE SURGERY     both cataracts  . FRACTURE SURGERY     lt foot  . KNEE SURGERY  2000   rt   . TONSILLECTOMY    . TOTAL KNEE ARTHROPLASTY Left 12/24/2013   Procedure: LEFT TOTAL KNEE ARTHROPLASTY;  Surgeon: Lorn Junes, MD;  Location: [facility];  Service: Orthopedics;  Laterality: Left;  . TUBAL LIGATION      OB History    No data available       Home Medications    Prior to Admission medications   Medication Sig Start Date End Date Taking? Authorizing Provider  acetaminophen (TYLENOL) 325 MG tablet Take 650 mg by mouth every 6 (six) hours as needed for moderate pain (pain).     [provider]  alendronate (FOSAMAX) 70 MG tablet Take 70 mg by mouth once a week. On Fridays. Take with a full glass of water on an empty stomach.    [provider]  allopurinol (ZYLOPRIM) 300 MG tablet Take 300 mg by mouth daily.    [provider]  anastrozole (ARIMIDEX) 1 MG tablet Take 1 tablet (1 mg total) by mouth daily. 11/08/16   Truitt Merle, MD  cephALEXin (KEFLEX) 500 MG capsule Take 1 capsule (500 mg total) by mouth 3 (three) times daily. 01/31/17 02/12/17  Fatima Blank, MD  colchicine 0.6 MG tablet Take 0.6 mg by mouth as needed (for gout flare).     [provider]  diphenhydramine-acetaminophen (TYLENOL PM) 25-500 MG TABS Take 2 tablets by mouth at bedtime as needed (sleep).     [provider]  esomeprazole (NEXIUM) 40 MG capsule Take 40 mg by mouth daily.  10/01/14   [provider]  losartan (COZAAR) 100 MG tablet Take 100 mg by  mouth daily.    [provider]  ondansetron (ZOFRAN ODT) 4 MG disintegrating tablet Take 1 tablet (4 mg total) by mouth every 8 (eight) hours as needed for nausea or vomiting. 01/31/17 02/03/17  Fatima Blank, MD  polycarbophil (FIBERCON) 625 MG tablet Take 625 mg by mouth daily.    [provider]  potassium chloride SA (K-DUR,KLOR-CON) 20 MEQ tablet Take 20 mEq by mouth daily.    [provider]  PROAIR HFA 108 (90 BASE) MCG/ACT inhaler Inhale 2 puffs into the lungs as needed.  02/04/15   [provider]  simvastatin (ZOCOR) 40 MG tablet Take 40 mg by mouth every evening.    [provider]  solifenacin (VESICARE) 10 MG tablet Take 10 mg by mouth daily.    [provider]  traMADol (ULTRAM) 50 MG tablet Take 50 mg by mouth every 6 (six) hours as needed for moderate pain or severe pain (pain).    [provider]    Family History Family History  Problem Relation Age of Onset  . Breast cancer Sister 58  . Leukemia Sister   . Heart disease Father   . Breast cancer Maternal Aunt 36  . Lung cancer Cousin        smoker    Social History Social History  Substance Use Topics  . Smoking status: Former Smoker    Packs/day: 0.25    Years: 20.00    Types: Cigarettes    Quit date: 11/09/1991  . Smokeless tobacco: Never Used  . Alcohol use Yes     Comment: socially     Allergies   Hydromorphone hcl; Nsaids; and Prednisone   Review of Systems Review of Systems  Constitutional: Negative for fever.  Gastrointestinal: Positive for abdominal pain and nausea. Negative for constipation, diarrhea, hematochezia, melena and vomiting.  Genitourinary: Negative for dysuria and frequency.   All other systems are reviewed and are negative for acute change except as noted in the HPI   Physical Exam Updated Vital Signs BP (!) 196/89   Pulse 77   Temp (!) 97.3 F (36.3 C) (Oral)   Resp 18   Ht 5\' 4"  (1.626 m)   Wt 78.9 kg  (174 lb)   SpO2 98%   BMI 29.87 kg/m   Physical Exam  Constitutional: She is oriented to person, place, and time. She appears well-developed and well-nourished. No distress.  HENT:  Head: Normocephalic and atraumatic.  Nose: Nose normal.  Eyes: Pupils are equal, round, and reactive to light. Conjunctivae and EOM are normal. Right eye exhibits no discharge. Left eye exhibits no discharge. No scleral icterus.  Neck: Normal range of motion. Neck supple.  Cardiovascular: Normal rate and regular rhythm.  Exam reveals no gallop and no friction rub.   No murmur heard. Pulmonary/Chest: Effort normal and breath sounds normal. No stridor. No respiratory distress. She has no rales.  Abdominal: Soft. She exhibits no distension. There is tenderness in the left lower quadrant. There is guarding. There is no rigidity and no rebound.  Musculoskeletal: She exhibits no edema or tenderness.  Neurological: She is alert and oriented to person, place, and time.  Skin: Skin is warm and dry. No rash noted. She is not diaphoretic. No erythema.  Psychiatric: She has a normal mood and affect.  Vitals reviewed.    ED Treatments / Results  Labs (all labs ordered are listed, but only abnormal results are displayed) Labs Reviewed  COMPREHENSIVE METABOLIC PANEL - Abnormal; Notable for the following:       Result Value   Glucose, Bld 124 (*)    Creatinine, Ser 1.48 (*)    GFR calc non Af Amer 32 (*)    GFR calc Af Amer 37 (*)    All other components within normal limits  CBC - Abnormal; Notable for the following:    WBC 12.8 (*)    All other components within normal limits  URINALYSIS, ROUTINE W REFLEX MICROSCOPIC - Abnormal; Notable for the following:    APPearance HAZY (*)    Hgb urine dipstick LARGE (*)    Protein, ur 100 (*)    Nitrite POSITIVE (*)    Leukocytes, UA SMALL (*)    Squamous Epithelial / LPF 0-5 (*)    All other components within normal limits  URINE CULTURE  LIPASE, BLOOD    EKG   EKG Interpretation None       Radiology Ct Abdomen Pelvis Wo Contrast  Result Date: 01/31/2017 CLINICAL DATA:  Left lower quadrant pain and nausea for 2 hours. EXAM: CT ABDOMEN AND PELVIS WITHOUT CONTRAST TECHNIQUE: Multidetector CT imaging of the abdomen and pelvis was performed following the standard protocol without IV contrast. COMPARISON:  08/05/2014 FINDINGS: Lower chest: Slight fibrosis in the lung bases anteriorly. Calcification in the mitral valve annulus. Small esophageal hiatal hernia. Calcifications versus surgical clips in the left breast. Hepatobiliary: No focal liver abnormality is seen. Status post cholecystectomy. No biliary dilatation. Pancreas: Unremarkable. No pancreatic ductal dilatation or surrounding inflammatory changes. Spleen: Normal in size without focal abnormality. Adrenals/Urinary Tract: Left adrenal gland nodule measures 1.3 cm diameter. Hounsfield unit measurements are consistent with fat density suggesting an adenoma. No change since previous study. There is a 3 mm stone in the mid left ureter at the level of L5. There is proximal hydronephrosis and hydroureter with prominent stranding around the left kidney and ureter. No other stones are demonstrated. No hydronephrosis or hydroureter on the right. Bladder wall is not thickened. No bladder stones. Stomach/Bowel: Stomach, small bowel, and colon are not abnormally distended. No wall thickening is appreciated. Scattered diverticula in the sigmoid colon. No evidence of diverticulitis. The appendix is normal. Vascular/Lymphatic: No significant vascular findings are present. No enlarged abdominal or pelvic lymph nodes. Reproductive: Status post hysterectomy. No adnexal masses. Other: Periumbilical hernias containing fat. No bowel herniation. No free air or free fluid in the abdomen. Musculoskeletal: Degenerative changes throughout the spine. No destructive bone lesions. IMPRESSION: 3 mm stone in the mid left ureter at the level  of L5. Moderate proximal obstruction. Diverticulosis of the sigmoid colon without evidence of diverticulitis. Electronically Signed   By: Lucienne Capers M.D.   On: 01/31/2017  00:04    Procedures Procedures (including critical care time) Emergency Ultrasound Study:   Angiocath insertion Performed by: Grayce Sessions Cardama  Consent: Verbal consent obtained. Risks and benefits: risks, benefits and alternatives were discussed Immediately prior to procedure the correct patient, procedure, equipment, support staff and site/side marked as needed.  Indication: difficult IV access Preparation: Patient and probe were prepped with chlorhexidine or alcohol. Sterile gel used. Vein Location: right basilic vein was visualized during assessment for potential access sites and was found to be patent/ easily compressed with linear ultrasound.  1.88 inch, 20 gauge needle was visualized with real-time ultrasound and guided into the vein.  Image saved and stored.  Normal blood return.  Patient tolerance: Patient tolerated the procedure well with no immediate complications.   Medications Ordered in ED Medications  iopamidol (ISOVUE-300) 61 % injection (not administered)  sodium chloride 0.9 % bolus 1,000 mL (0 mLs Intravenous Stopped 01/31/17 0001)  fentaNYL (SUBLIMAZE) injection 50 mcg (50 mcg Intravenous Given 01/30/17 2216)  ondansetron (ZOFRAN) injection 4 mg (4 mg Intravenous Given 01/30/17 2250)  cefTRIAXone (ROCEPHIN) 1 g in dextrose 5 % 50 mL IVPB (0 g Intravenous Stopped 01/31/17 0045)     Initial Impression / Assessment and Plan / ED Course  I have reviewed the triage vital signs and the nursing notes.  Pertinent labs & imaging results that were available during my care of the patient were reviewed by me and considered in my medical decision making (see chart for details).     Left lower quadrant abdominal pain and tenderness. History of diverticulitis and recurrent urinary tract infection.  Labs with leukocytosis. UA concerning for urinary tract infection though she is not having any urinary symptoms other than the left lower quadrant pain. We will obtain a CT scan to assess for diverticulitis.  She was provided with IV fluids, pain medication. Given IV Rocephin while she awaits CT scan to treat for possible ascending urinary tract infection.  CT noted no evidence of diverticulitis however we did note a 3 mm obstructing stone causing hydronephrosis. Given the UA concerning for urinary tract infection, the case was discussed with Dr. Matilde Sprang, who did not feel that patient required emergent stenting given the fact that she was afebrile, nontoxic and her pain was under control. He did recommend patient the patient on [facility] on antibiotics. He wants to see the patient in his clinic tomorrow.  The patient is safe for discharge with strict return precautions.  Final Clinical Impressions(s) / ED Diagnoses   Final diagnoses:  Ureterolithiasis  Hydronephrosis with urinary obstruction due to renal calculus  Pyelonephritis   Disposition: Discharge  Condition: Good  I have discussed the results, Dx and Tx plan with the patient and daugther who expressed understanding and agree(s) with the plan. Discharge instructions discussed at great length. The patient and daughter was given strict return precautions who verbalized understanding of the instructions. No further questions at time of discharge.    New Prescriptions   CEPHALEXIN (KEFLEX) 500 MG CAPSULE    Take 1 capsule (500 mg total) by mouth 3 (three) times daily.   ONDANSETRON (ZOFRAN ODT) 4 MG DISINTEGRATING TABLET    Take 1 tablet (4 mg total) by mouth every 8 (eight) hours as needed for nausea or vomiting.    Follow Up: [facility], [place] [address] [phone]  Call  In the morning. They will give you a time to be seen tomorrow.      Cardama,  Grayce Sessions, MD 01/31/17 [phone]

## 2017-01-30 NOTE — ED Notes (Signed)
Patient transported to CT 

## 2017-01-30 NOTE — ED Notes (Signed)
This RN attempted IV x 2 without success.   MD made aware

## 2017-01-30 NOTE — ED Notes (Signed)
Pt unable to void at this time.  She has a urine cup and is aware of need for urine sample.

## 2017-01-30 NOTE — ED Triage Notes (Signed)
History of diverticulitis.  C/o LLQ pain and nausea x 2 hours.  Denies urinary complaint.

## 2017-01-30 NOTE — ED Notes (Signed)
Pt started on second bottle of oral contrast

## 2017-01-30 NOTE — ED Notes (Signed)
MD at bedside updating patient.

## 2017-01-31 MED ORDER — CEPHALEXIN 500 MG PO CAPS: 500.0000 mg | ORAL_CAPSULE | Freq: Three times a day (TID) | ORAL | 0 refills | Status: AC

## 2017-01-31 MED ORDER — ONDANSETRON 4 MG PO TBDP: 4.0000 mg | ORAL_TABLET | Freq: Three times a day (TID) | ORAL | 0 refills | Status: AC | PRN

## 2017-01-31 NOTE — Discharge Instructions (Signed)
If you develop a fever, are unable to tolerate oral hydration or keep her antibiotics down, or have worsening pain that is not controlled with pain medication, please return to the emergency department.

## 2017-01-31 NOTE — ED Notes (Signed)
Patient able to ambulate independently  

## 2017-02-03 LAB — URINE CULTURE

## 2017-03-07 ENCOUNTER — Telehealth: Payer: Self-pay | Admitting: *Deleted

## 2017-03-07 NOTE — Telephone Encounter (Signed)
Received faxed mammogram results from [facility] done   03/03/17.   Gave report to Dr. Burr Medico for review.

## 2017-04-19 ENCOUNTER — Other Ambulatory Visit: Payer: Self-pay | Admitting: Hematology

## 2017-04-20 ENCOUNTER — Other Ambulatory Visit: Payer: Self-pay | Admitting: *Deleted

## 2017-04-20 MED ORDER — ANASTROZOLE 1 MG PO TABS: 1.0000 mg | ORAL_TABLET | Freq: Every day | ORAL | 1 refills | Status: DC

## 2017-05-20 ENCOUNTER — Telehealth: Payer: Self-pay | Admitting: Hematology

## 2017-05-20 NOTE — Telephone Encounter (Signed)
lvm advising appt [facility] from 2/4 to 2/18 @ 10.

## 2017-06-06 ENCOUNTER — Other Ambulatory Visit: Payer: Medicare Other

## 2017-06-06 ENCOUNTER — Ambulatory Visit: Payer: Medicare Other | Admitting: Hematology

## 2017-06-20 ENCOUNTER — Ambulatory Visit: Payer: Medicare Other | Admitting: Hematology

## 2017-06-20 ENCOUNTER — Other Ambulatory Visit: Payer: Medicare Other

## 2017-06-21 NOTE — Progress Notes (Signed)
[facility] OFFICE PROGRESS NOTE  [name], MD [place]  [id number]  Date of Service:  06/23/2017   Chief complain: follow up Breast cancer   Oncology History   Breast cancer, left breast   Staging form: Breast, AJCC 7th Edition     Clinical stage from 12/07/2011: Stage IA (T1, N0, cM0) - Signed by Haywood Lasso, MD on 12/07/2011       Prognostic indicators: ER = 100%  PR = 100%  Ki67 = 12%  Her 2 equivocal on BX, 1.26 (no amp) on lumpectomy      Pathologic: Stage IA (T1c, N0, cM0) - Signed by Haywood Lasso, MD on 01/24/2012       Prognostic indicators: ER = 100%  PR = 100%  Ki67 = 12%  Her 2 equivocal on BX, 1.26 (no amp) on lumpectomy        Breast cancer of lower-outer quadrant of left female breast ([place])   12/07/2011 Initial Diagnosis    Breast cancer, left breast      01/02/2012 Surgery    lumpectomy       03/14/2012 - 05/02/2012 Radiation Therapy    35 radiation treatments under the direction of Dr. Sondra Come that was completed on        05/06/2012 - 04/2017 Anti-estrogen oral therapy    Arimidex 1 mg daily      03/16/2016 Mammogram    IMPRESSION: Stable left breast lumpectomy site. No mammographic evidence of malignancy in the bilateral breasts.      09/07/2016 Imaging    US Abdomen Complete IMPRESSION: Post cholecystectomy.  Common bile duct and liver unremarkable.  Slightly prominent pancreatic duct unchanged from 2013. No pancreatic mass identified (tail not visualized secondary to bowel gas).  Mild bilateral renal parenchymal thinning without hydronephrosis.  Aortic atherosclerosis.       CURRENT THERAPY: Surveillance  INTERVAL HISTORY:  Pt returns for follow up of her breast cancer. She was last seen by [place] 6 months ago. Of note, since her last visit she presented to the ED on 01/30/17 for abdominal pain. CT scan showed it be caused by Ureterolithiasis. She was given antibiotics and her  kidney stone was able to pass. She was followed as outpatient by urologist Dr. Matilde Sprang.  She presents to the clinic today accompanied by her daughter. She notes her BP today was high at 198/78. She is willing to redo this after visit. She notes her medication list. She has completed her anastrozole in 04/2017. She is no longer on oral potassium. She is currently on HTN medication losartan. She took it this morning. She notes her BP is high in our clinic, but normally WNL.    On review of symptoms, pt denies headaches and any new or concerning symptoms.     MEDICAL HISTORY: Past Medical History:  Diagnosis Date  . Allergy    seasonal allergies  . Arthritis    knee and hands  . Breast cancer, left breast ([place]) 12/07/2011  . Breast cancer, left breast ([place]) 11/24/11   left breast 4 o'clock bx=invasive ductal ca,grade II,ER/PR=+  . Diverticulitis   . Full dentures   . GERD (gastroesophageal reflux disease)   . HOH (hard of hearing)    right ear   . Hx of radiation therapy 03/14/12- 05/02/12   left breast 5040 gray in 28 fx, site of presentation lower outer quadrant boosted to 6440 gray  . Hyperlipemia   . Hypertension   .  Left knee DJD   . PONV (postoperative nausea and vomiting)    has used scop patch-that helped  . Reflux   . Renal insufficiency 11/09/2011  . Shortness of breath   . Wears glasses     INTERIM HISTORY: has Leucocytosis; Renal insufficiency; Gout; Breast cancer of lower-outer quadrant of left female breast ([place]); Hx of radiation therapy; Osteoporosis; PONV (postoperative nausea and vomiting); GERD (gastroesophageal reflux disease); Hypertension; Left knee DJD; DJD (degenerative joint disease) of knee; and UTI (urinary tract infection) Klebsiella on their problem list.    ALLERGIES:  is allergic to hydromorphone hcl; nsaids; and prednisone.  MEDICATIONS: has a current medication list which includes the following prescription(s): acetaminophen, alendronate,  allopurinol, anastrozole, colchicine, diphenhydramine-acetaminophen, esomeprazole, losartan, polycarbophil, potassium chloride sa, proair hfa, simvastatin, solifenacin, and tramadol.  SURGICAL HISTORY:  Past Surgical History:  Procedure Laterality Date  . ABDOMINAL HYSTERECTOMY  93   total  . BLADDER SUSPENSION    . BREAST LUMPECTOMY  01/04/2012   left,invasive ductcl ca, grade II,lloq, lymphovascular and perineural invaion ,DCIS,  . CHOLECYSTECTOMY    . EYE SURGERY     both cataracts  . FRACTURE SURGERY     lt foot  . KNEE SURGERY  2000   rt   . TONSILLECTOMY    . TOTAL KNEE ARTHROPLASTY Left 12/24/2013   Procedure: LEFT TOTAL KNEE ARTHROPLASTY;  Surgeon: Lorn Junes, MD;  Location: [facility];  Service: Orthopedics;  Laterality: Left;  . TUBAL LIGATION     PROBLEM LIST:  1. Stage I, T1c N0 M0, 1.3 cm. invasive ductal carcinoma in situ, grade 2. The tumor was ER 100%, PR 100% positive, HER- 2/neu negative. HER-2/neu was negative by FISH. Ki67 proliferation marker was 14%. There was evidence of lymphovascular and perineural invasion. The tumor was 2 mm from the nearest margin inferiorly. None of the 3 lymph nodes sampled had evidence of tumor. She underwent a lumpectomy with sentinel node dissection on 01/04/2012. Radiation treatments to the left breast were administered by Dr. Gery Pray from 03/14/2012 to 05/02/2012. The patient received 5040 cGy in 28 fractions plus a boost, for a total of 6440 cGy in 35 fractions. Genetic testing was carried out with the BreastNext gene panel through [employer]. No cancer causing mutations were detected in any of the 18 genes tested. She began taking Arimidex 1 mg PO daily on 05/06/2012.  2. Overactive bladder  3. Gout  4. Hyperlipidemia  5. 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 HTN  7. Osteopenia  8. Arthritis  9. Impaired renal functioning  10. GERD  11. Sigmoid diverticulosis requiring admission in August 2012.  REVIEW OF SYSTEMS:   Constitutional: Denies fevers, chills or abnormal weight loss Eyes: Denies blurriness of vision Ears, nose, mouth, throat, and face: Denies mucositis or sore throat Respiratory: Denies cough, dyspnea or wheezes Cardiovascular: Denies palpitation, chest discomfort or lower extremity swelling Gastrointestinal:  Denies nausea, heartburn or change in bowel habits Skin: Denies abnormal skin rashes Lymphatics: Denies new lymphadenopathy or easy bruising Neurological:Denies numbness, tingling or new weaknesses Behavioral/Psych: Mood is stable, no new changes  All other systems were reviewed with the patient and are negative.  PHYSICAL EXAMINATION: ECOG PERFORMANCE STATUS: 0 - Asymptomatic  Blood pressure (!) 198/78, pulse 70, temperature 97.6 F (36.4 C), temperature source Oral, resp. rate 17, height _0  (1.626 m), weight 173 lb 8 oz (78.7 kg), SpO2 99 %.  GENERAL:alert, no distress and comfortable elderly female who is easily mobile.  SKIN:  skin color, texture, turgor are normal, no rashes or significant lesions EYES: normal, Conjunctiva are pink and non-injected, sclera clear OROPHARYNX:no exudate, no erythema and lips, buccal mucosa, and tongue normal  NECK: supple, thyroid normal size, non-tender, without nodularity LYMPH:  no palpable lymphadenopathy in the cervical, axillary or supraclavicular LUNGS: clear to auscultation and percussion with normal breathing effort HEART: regular rate & rhythm and no murmurs and no lower extremity edema BREASTS: Right breast exam is normal; left breast slightly smaller than the right breast. Well-healed surgical scar, with mild tenderness. There were no palpable mass, adenopathy, skin dimpling or nipple inversion or induration.  ABDOMEN:abdomen soft, normal bowel sounds. No other tenderness or palpable mass  Musculoskeletal:no cyanosis of digits and no clubbing  NEURO: alert & oriented x 3 with fluent speech, no focal motor/sensory  deficits  LABORATORY DATA: CBC Latest Ref Rng & Units 06/23/2017 01/30/2017 12/03/2016  WBC 3.9 - 10.3 K/uL 8.4 12.8(H) 8.1  Hemoglobin 11.6 - 15.9 g/dL 12.1 13.2 12.7  Hematocrit 34.8 - 46.6 % 38.3 41.4 39.9  Platelets 145 - 400 K/uL 229 255 236    CMP Latest Ref Rng & Units 06/23/2017 01/30/2017 12/03/2016  Glucose 70 - 140 mg/dL 90 124(H) 106  BUN 7 - 26 mg/dL 19 18 18.7  Creatinine 0.60 - 1.10 mg/dL 1.23(H) 1.48(H) 1.4(H)  Sodium 136 - 145 mmol/L 142 140 145  Potassium 3.5 - 5.1 mmol/L 3.8 3.7 4.9  Chloride 98 - 109 mmol/L 108 106 -  CO2 22 - 29 mmol/L _0 Calcium 8.4 - 10.4 mg/dL 9.8 9.6 9.9  Total Protein 6.4 - 8.3 g/dL 6.4 7.0 6.3(L)  Total Bilirubin 0.2 - 1.2 mg/dL 0.4 0.6 0.50  Alkaline Phos 40 - 150 U/L 85 77 81  AST 5 - 34 U/L _1 ALT 0 - 55 U/L _2 RADIOGRAPHIC STUDIES:  US Abdomen Complete 09/07/2016 IMPRESSION: Post cholecystectomy.  Common bile duct and liver unremarkable. Slightly prominent pancreatic duct unchanged from 2013. No pancreatic mass identified (tail not visualized secondary to bowel gas). Mild bilateral renal parenchymal thinning without hydronephrosis. Aortic atherosclerosis.   DEXA scan which was done on 04/06/2016: Impression, osteoporosis of left arm. Lowest site measures: Left distal radius, BMD 0.543, T score -2.5.   Mammogram 03/16/2016 IIMPRESSION: Stable left breast lumpectomy site. No mammographic evidence of malignancy in the bilateral breasts.  RECOMMENDATION: Diagnostic mammogram is suggested in 1 year. (Code:DM-B-01YIMPRESSION: Stable left breast lumpectomy site. No mammographic evidence of malignancy in the bilateral breasts.  RECOMMENDATION: Diagnostic mammogram is suggested in 1 year. (Code:DM-B-01Y)   ASSESSMENT: Melissa Morton 84 y.o. female with a history of  1. Breast cancer of lower-outer quadrant of left female breast, pT1cN0M0, stage I, ER+/PR+, HER2- -- status post lumpectomy and  radiation treatments, and completed 5 years of adjuvant anastrozole  -I encouraged her to continue healthy diet and regular exercise. She remains to be physically active. -She is clinically doing well. Lab reviewed, her CBC and CMP are within normal limits, her Cr has improved to 1.23. Her physical exam and her 2018 mammogram were unremarkable. There is no clinical concern for recurrence. -Will obtain her 2018 mamm[facility] for our records -We'll continue breast cancer surveillance with annual mammogram, self exam and routine follow-up. -next mammogram in 04/2018.  - f/u in 1 year.   2. Osteoporosis -I previously reviewed her bone density scan results from 2015. She has osteopenia. -She will follow-up bone  density scan with her primary care physician every 2 years.  -Received a copy of her last DEXA scan which was done on 04/06/2016: Impression, osteoporosis of left arm. Lowest site measures: Left distal radius, BMD 0.543, T score -2.5. -Continue calcium, vitamin D and Fosamax, tolerating well.   3. Hypertension, arthritis  -She'll continue follow-up with her primary care physician, every 6 months. -Her blood pressure was 198/78 today (06/23/17), but asymptomatic. Has been high in most visit with our clinic, but mostly in normal range otherwise.. She continues to take Losartan daily.  -Patient will continue monitoring her blood pressure at home, and f/u with PCP   4. RUQ abdominal tenderness  -Resolved now, her abdominal ultrasound was negative in May 2018.   PLAN: -BP at 198/77, she will monitor at home  -Lab and f/u in one year  -Mammogram in 04/2018 at [facility]    I spent 15 minutes counseling the patient face to face. The total time spent in the appointment was 20 minutes.   Truitt Merle  06/23/2017   This document serves as a record of services personally performed by Truitt Merle, MD. It was created on her behalf by Joslyn Devon, a trained medical scribe. The creation of this  record is based on the scribe's personal observations and the provider's statements to them.   I have reviewed the above documentation for accuracy and completeness, and I agree with the above.

## 2017-06-23 ENCOUNTER — Inpatient Hospital Stay: Payer: Medicare Other | Attending: Hematology

## 2017-06-23 ENCOUNTER — Telehealth: Payer: Self-pay

## 2017-06-23 ENCOUNTER — Inpatient Hospital Stay (HOSPITAL_BASED_OUTPATIENT_CLINIC_OR_DEPARTMENT_OTHER): Payer: Medicare Other | Admitting: Hematology

## 2017-06-23 ENCOUNTER — Encounter: Payer: Self-pay | Admitting: Hematology

## 2017-06-23 VITALS — BP 198/78 | HR 70 | Temp 97.6°F | Resp 17 | Ht 64.0 in | Wt 173.5 lb

## 2017-06-23 DIAGNOSIS — Z170 Estrogen receptor positive status [ER+]: Secondary | ICD-10-CM

## 2017-06-23 DIAGNOSIS — I10 Essential (primary) hypertension: Secondary | ICD-10-CM | POA: Diagnosis not present

## 2017-06-23 DIAGNOSIS — M1990 Unspecified osteoarthritis, unspecified site: Secondary | ICD-10-CM | POA: Diagnosis not present

## 2017-06-23 DIAGNOSIS — M810 Age-related osteoporosis without current pathological fracture: Secondary | ICD-10-CM | POA: Diagnosis not present

## 2017-06-23 DIAGNOSIS — Z923 Personal history of irradiation: Secondary | ICD-10-CM | POA: Diagnosis not present

## 2017-06-23 DIAGNOSIS — Z853 Personal history of malignant neoplasm of breast: Secondary | ICD-10-CM | POA: Diagnosis present

## 2017-06-23 DIAGNOSIS — K219 Gastro-esophageal reflux disease without esophagitis: Secondary | ICD-10-CM | POA: Insufficient documentation

## 2017-06-23 DIAGNOSIS — C50512 Malignant neoplasm of lower-outer quadrant of left female breast: Secondary | ICD-10-CM

## 2017-06-23 DIAGNOSIS — Z1231 Encounter for screening mammogram for malignant neoplasm of breast: Secondary | ICD-10-CM

## 2017-06-23 LAB — CBC WITH DIFFERENTIAL/PLATELET
Basophils Absolute: 0 10*3/uL (ref 0.0–0.1)
Basophils Relative: 0 %
Eosinophils Absolute: 0.3 10*3/uL (ref 0.0–0.5)
Eosinophils Relative: 3 %
HCT: 38.3 % (ref 34.8–46.6)
HEMOGLOBIN: 12.1 g/dL (ref 11.6–15.9)
LYMPHS ABS: 2.6 10*3/uL (ref 0.9–3.3)
LYMPHS PCT: 31 %
MCH: 28.6 pg (ref 25.1–34.0)
MCHC: 31.6 g/dL (ref 31.5–36.0)
MCV: 90.5 fL (ref 79.5–101.0)
Monocytes Absolute: 0.5 10*3/uL (ref 0.1–0.9)
Monocytes Relative: 6 %
NEUTROS PCT: 60 %
Neutro Abs: 5.1 10*3/uL (ref 1.5–6.5)
Platelets: 229 10*3/uL (ref 145–400)
RBC: 4.23 MIL/uL (ref 3.70–5.45)
RDW: 15.5 % — ABNORMAL HIGH (ref 11.2–14.5)
WBC: 8.4 10*3/uL (ref 3.9–10.3)

## 2017-06-23 LAB — COMPREHENSIVE METABOLIC PANEL
ALT: 10 U/L (ref 0–55)
AST: 13 U/L (ref 5–34)
Albumin: 3.6 g/dL (ref 3.5–5.0)
Alkaline Phosphatase: 85 U/L (ref 40–150)
Anion gap: 10 (ref 3–11)
BUN: 19 mg/dL (ref 7–26)
CHLORIDE: 108 mmol/L (ref 98–109)
CO2: 24 mmol/L (ref 22–29)
Calcium: 9.8 mg/dL (ref 8.4–10.4)
Creatinine, Ser: 1.23 mg/dL — ABNORMAL HIGH (ref 0.60–1.10)
GFR calc Af Amer: 46 mL/min — ABNORMAL LOW (ref >60)
GFR, EST NON AFRICAN AMERICAN: 40 mL/min — AB (ref >60)
Glucose, Bld: 90 mg/dL (ref 70–140)
POTASSIUM: 3.8 mmol/L (ref 3.5–5.1)
SODIUM: 142 mmol/L (ref 136–145)
Total Bilirubin: 0.4 mg/dL (ref 0.2–1.2)
Total Protein: 6.4 g/dL (ref 6.4–8.3)

## 2017-06-23 NOTE — Telephone Encounter (Signed)
  Printed avs and calender of upcoming appointment. Per 2/21 los 

## 2017-07-22 ENCOUNTER — Other Ambulatory Visit: Payer: Self-pay | Admitting: Hematology

## 2017-07-22 DIAGNOSIS — Z170 Estrogen receptor positive status [ER+]: Principal | ICD-10-CM

## 2017-07-22 DIAGNOSIS — C50512 Malignant neoplasm of lower-outer quadrant of left female breast: Secondary | ICD-10-CM

## 2018-03-21 ENCOUNTER — Encounter: Payer: Self-pay | Admitting: Hematology

## 2018-06-21 NOTE — Progress Notes (Signed)
[facility]   [phone] [phone]   Clinic Follow up Note   Patient Care Team: [name], MD as PCP - General (Family Medicine) Neldon Mc, MD as Surgeon (General Surgery) Nira Retort, MD as Consulting Physician (Hematology and Oncology)  Date of Service:  06/23/2018  CHIEF COMPLAINT: follow up left breast cancer  SUMMARY OF ONCOLOGIC HISTORY: Oncology History   Breast cancer, left breast   Staging form: Breast, AJCC 7th Edition     Clinical stage from 12/07/2011: Stage IA (T1, N0, cM0) - Signed by Haywood Lasso, MD on 12/07/2011       Prognostic indicators: ER = 100%  PR = 100%  Ki67 = 12%  Her 2 equivocal on BX, 1.26 (no amp) on lumpectomy      Pathologic: Stage IA (T1c, N0, cM0) - Signed by Haywood Lasso, MD on 01/24/2012       Prognostic indicators: ER = 100%  PR = 100%  Ki67 = 12%  Her 2 equivocal on BX, 1.26 (no amp) on lumpectomy        Breast cancer of lower-outer quadrant of left female breast ([place])   12/07/2011 Initial Diagnosis    Breast cancer, left breast    01/02/2012 Surgery    lumpectomy     03/14/2012 - 05/02/2012 Radiation Therapy    35 radiation treatments under the direction of Dr. Sondra Come that was completed on      05/06/2012 - 04/2017 Anti-estrogen oral therapy    Arimidex 1 mg daily    03/16/2016 Mammogram    IMPRESSION: Stable left breast lumpectomy site. No mammographic evidence of malignancy in the bilateral breasts.    09/07/2016 Imaging    US Abdomen Complete IMPRESSION: Post cholecystectomy.  Common bile duct and liver unremarkable.  Slightly prominent pancreatic duct unchanged from 2013. No pancreatic mass identified (tail not visualized secondary to bowel gas).  Mild bilateral renal parenchymal thinning without hydronephrosis.  Aortic atherosclerosis.      CURRENT THERAPY:   Surveillance  INTERVAL HISTORY:  Melissa Morton is here for a follow up of left  breast cancer. She was last seen by me 1 year ago. She presents to the clinic today with her family member. She notes she is doing well and has had no major changes or events in the last year. She denies and pain or concerns. She notes she a had a UTI which was found by her yearly exam and urinalysis at PCP. She notes her electric BP is usually high but manual BP is usually normal. She notes she takes tramadol for her arthritis.   She notes having skin cancer that was removed on her anterior lower left leg. She also notes peeling of her nose. She does not plan to see her dermatologist soon.   REVIEW OF SYSTEMS:   Constitutional: Denies fevers, chills or abnormal weight loss Eyes: Denies blurriness of vision Ears, nose, mouth, throat, and face: Denies mucositis or sore throat Respiratory: Denies cough, dyspnea or wheezes Cardiovascular: Denies palpitation, chest discomfort or lower extremity swelling Gastrointestinal:  Denies nausea, heartburn or change in bowel habits Skin: Denies abnormal skin rashes (+) Scar on anterior lower left leg MSK: (+) Arthritis  Lymphatics: Denies new lymphadenopathy or easy bruising Neurological:Denies numbness, tingling or new weaknesses Behavioral/Psych: Mood is stable, no new changes  All other systems were reviewed with the patient and are negative.  MEDICAL HISTORY:  Past Medical History:  Diagnosis Date  . Allergy  seasonal allergies  . Arthritis    knee and hands  . Breast cancer, left breast ([place]) 12/07/2011  . Breast cancer, left breast ([place]) 11/24/11   left breast 4 o'clock bx=invasive ductal ca,grade II,ER/PR=+  . Diverticulitis   . Full dentures   . GERD (gastroesophageal reflux disease)   . HOH (hard of hearing)    right ear   . Hx of radiation therapy 03/14/12- 05/02/12   left breast 5040 gray in 28 fx, site of presentation lower outer quadrant boosted to 6440 gray  . Hyperlipemia   . Hypertension   . Left knee DJD   . PONV  (postoperative nausea and vomiting)    has used scop patch-that helped  . Reflux   . Renal insufficiency 11/09/2011  . Shortness of breath   . Wears glasses     SURGICAL HISTORY: Past Surgical History:  Procedure Laterality Date  . ABDOMINAL HYSTERECTOMY  93   total  . BLADDER SUSPENSION    . BREAST LUMPECTOMY  01/04/2012   left,invasive ductcl ca, grade II,lloq, lymphovascular and perineural invaion ,DCIS,  . CHOLECYSTECTOMY    . EYE SURGERY     both cataracts  . FRACTURE SURGERY     lt foot  . KNEE SURGERY  2000   rt   . TONSILLECTOMY    . TOTAL KNEE ARTHROPLASTY Left 12/24/2013   Procedure: LEFT TOTAL KNEE ARTHROPLASTY;  Surgeon: Lorn Junes, MD;  Location: [facility];  Service: Orthopedics;  Laterality: Left;  . TUBAL LIGATION      I have reviewed the social history and family history with the patient and they are unchanged from previous note.  ALLERGIES:  is allergic to hydromorphone hcl; nsaids; and prednisone.  MEDICATIONS:  Current Outpatient Medications  Medication Sig Dispense Refill  . acetaminophen (TYLENOL) 325 MG tablet Take 650 mg by mouth every 6 (six) hours as needed for moderate pain (pain).     Marland Kitchen alendronate (FOSAMAX) 70 MG tablet Take 70 mg by mouth once a week. On Fridays. Take with a full glass of water on an empty stomach.    Marland Kitchen allopurinol (ZYLOPRIM) 300 MG tablet Take 300 mg by mouth daily.    . colchicine 0.6 MG tablet Take 0.6 mg by mouth as needed (for gout flare).     . diphenhydramine-acetaminophen (TYLENOL PM) 25-500 MG TABS Take 2 tablets by mouth at bedtime as needed (sleep).     Marland Kitchen esomeprazole (NEXIUM) 40 MG capsule Take 40 mg by mouth daily.     Marland Kitchen losartan (COZAAR) 100 MG tablet Take 100 mg by mouth daily.    . polycarbophil (FIBERCON) 625 MG tablet Take 625 mg by mouth daily.    Marland Kitchen PROAIR HFA 108 (90 BASE) MCG/ACT inhaler Inhale 2 puffs into the lungs as needed.     . simvastatin (ZOCOR) 40 MG tablet Take 40 mg by mouth every evening.    .  solifenacin (VESICARE) 10 MG tablet Take 10 mg by mouth daily.    . traMADol (ULTRAM) 50 MG tablet Take 50 mg by mouth every 6 (six) hours as needed for moderate pain or severe pain (pain).     No current facility-administered medications for this visit.     PHYSICAL EXAMINATION: ECOG PERFORMANCE STATUS: 0 - Asymptomatic  Vitals:   06/23/18 1025  BP: (!) 175/82  Pulse: 68  Resp: 20  Temp: 98 F (36.7 C)  SpO2: 100%   Filed Weights   06/23/18 1025  Weight: 171 lb  3.2 oz (77.7 kg)    GENERAL:alert, no distress and comfortable SKIN: skin color, texture, turgor are normal, no rashes or significant lesions (+) Scar of anterior lower left leg from skin cancer removal.  EYES: normal, Conjunctiva are pink and non-injected, sclera clear OROPHARYNX:no exudate, no erythema and lips, buccal mucosa, and tongue normal  NECK: supple, thyroid normal size, non-tender, without nodularity LYMPH:  no palpable lymphadenopathy in the cervical, axillary or inguinal LUNGS: clear to auscultation and percussion with normal breathing effort HEART: regular rate & rhythm and no murmurs (+) Mild lower extremity edema b/l  ABDOMEN:abdomen soft, non-tender and normal bowel sounds Musculoskeletal:no cyanosis of digits and no clubbing  NEURO: alert & oriented x 3 with fluent speech, no focal motor/sensory deficits BREAST: S/p left breast lumpectomy: Incision healed well with mild scar tissue at breast and axillary incision. (+) No palpable mass or adenopathy   LABORATORY DATA:  I have reviewed the data as listed CBC Latest Ref Rng & Units 06/23/2018 06/23/2017 01/30/2017  WBC 4.0 - 10.5 K/uL 6.2 8.4 12.8(H)  Hemoglobin 12.0 - 15.0 g/dL 12.5 12.1 13.2  Hematocrit 36.0 - 46.0 % 39.6 38.3 41.4  Platelets 150 - 400 K/uL 243 229 255     CMP Latest Ref Rng & Units 06/23/2018 06/23/2017 01/30/2017  Glucose 70 - 99 mg/dL 98 90 124(H)  BUN 8 - 23 mg/dL _0 Creatinine 0.44 - 1.00 mg/dL 1.31(H) 1.23(H) 1.48(H)    Sodium 135 - 145 mmol/L 143 142 140  Potassium 3.5 - 5.1 mmol/L 4.3 3.8 3.7  Chloride 98 - 111 mmol/L 108 108 106  CO2 22 - 32 mmol/L _1 Calcium 8.9 - 10.3 mg/dL 9.3 9.8 9.6  Total Protein 6.5 - 8.1 g/dL 6.5 6.4 7.0  Total Bilirubin 0.3 - 1.2 mg/dL 0.5 0.4 0.6  Alkaline Phos 38 - 126 U/L 98 85 77  AST 15 - 41 U/L _2 ALT 0 - 44 U/L _3 RADIOGRAPHIC STUDIES: I have personally reviewed the radiological images as listed and agreed with the findings in the report. No results found.   ASSESSMENT & PLAN:  Melissa Morton is a 85 y.o. female with    1. Breast cancer of lower-outer quadrant of left female breast, pT1cN0M0, stage I, ER+/PR+, HER2- -She was diagnosed in 12/2011. She is s/p left breast lumpectomy, adjuvant radiation and 5 years of anti-estrogen therapy with Anastrozole.  -She is clinically doing well. Lab reviewed, her CBC WNL and CMP was unremarkable except a mildly elevated creatinine. Her physical exam and her 03/2018 mammogram were unremarkable. There is no clinical concern for recurrence. -I disucssed with her cancer there is a small risk of distant recurrence. I encouraged her to continue yearly mammograms, exams and labs.   -I also encouraged her to continue healthy diet and be physically active.  -F/u with NP Lacie in 1 year and follow up with her PCP in interim.    2. Osteoporosis  -I previously reviewed her bone density scan results from 2015. She has osteopenia. Her 04/2016 DEXA showed osteoporosis of Left distal radius, T score -2.5. -She will follow-up bone density scan with her primary care physician every 2 years.  -Continue calcium, vitamin D and Fosamax, tolerating well. -Her next DEXA is next week.   3. Hypertension, arthritis  -She'll continue follow-up with her primary care physician, every 6 months. -She continues to take Losartan daily.  -On tramadol  for arthritis. I encouraged her to remain active.  -BP elevated at 175/82  today (06/23/18). I encouraged her to monitor at home.   4. Skin cancer of anterior lower left leg  -S/p removal  -She is mildly concerned of another skin lesion on her nose. I disucssed if she noticing any change she should return to her Dermatologist.    PLAN: -She is clinically doing well, Continue surveillance  -Mammogram in 03/2019 at [facility] and f/u in one year with Lacie     No problem-specific Assessment & Plan notes found for this encounter.   Orders Placed This Encounter  Procedures  . MM Digital Screening    Standing Status:   Future    Standing Expiration Date:   06/23/2019    Scheduling Instructions:     [facility]    Order Specific Question:   Reason for Exam (SYMPTOM  OR DIAGNOSIS REQUIRED)    Answer:   screening    Order Specific Question:   Preferred imaging location?    Answer:   External   All questions were answered. The patient knows to call the clinic with any problems, questions or concerns. No barriers to learning was detected. I spent 15 minutes counseling the patient face to face. The total time spent in the appointment was 20 minutes and more than 50% was on counseling and review of test results     Truitt Merle, MD 06/23/2018   I, Joslyn Devon, am acting as scribe for Truitt Merle, MD.   I have reviewed the above documentation for accuracy and completeness, and I agree with the above.

## 2018-06-23 ENCOUNTER — Inpatient Hospital Stay: Payer: Medicare Other

## 2018-06-23 ENCOUNTER — Encounter: Payer: Self-pay | Admitting: Hematology

## 2018-06-23 ENCOUNTER — Other Ambulatory Visit: Payer: Self-pay | Admitting: Family Medicine

## 2018-06-23 ENCOUNTER — Telehealth: Payer: Self-pay | Admitting: Hematology

## 2018-06-23 ENCOUNTER — Inpatient Hospital Stay: Payer: Medicare Other | Attending: Hematology | Admitting: Hematology

## 2018-06-23 VITALS — BP 175/82 | HR 68 | Temp 98.0°F | Resp 20 | Ht 64.0 in | Wt 171.2 lb

## 2018-06-23 DIAGNOSIS — Z853 Personal history of malignant neoplasm of breast: Secondary | ICD-10-CM | POA: Diagnosis present

## 2018-06-23 DIAGNOSIS — Z923 Personal history of irradiation: Secondary | ICD-10-CM | POA: Insufficient documentation

## 2018-06-23 DIAGNOSIS — Z85828 Personal history of other malignant neoplasm of skin: Secondary | ICD-10-CM | POA: Diagnosis not present

## 2018-06-23 DIAGNOSIS — Z170 Estrogen receptor positive status [ER+]: Secondary | ICD-10-CM

## 2018-06-23 DIAGNOSIS — K219 Gastro-esophageal reflux disease without esophagitis: Secondary | ICD-10-CM | POA: Diagnosis not present

## 2018-06-23 DIAGNOSIS — E785 Hyperlipidemia, unspecified: Secondary | ICD-10-CM | POA: Diagnosis not present

## 2018-06-23 DIAGNOSIS — Z79899 Other long term (current) drug therapy: Secondary | ICD-10-CM

## 2018-06-23 DIAGNOSIS — C50512 Malignant neoplasm of lower-outer quadrant of left female breast: Secondary | ICD-10-CM

## 2018-06-23 DIAGNOSIS — M810 Age-related osteoporosis without current pathological fracture: Secondary | ICD-10-CM

## 2018-06-23 DIAGNOSIS — M1990 Unspecified osteoarthritis, unspecified site: Secondary | ICD-10-CM

## 2018-06-23 DIAGNOSIS — I10 Essential (primary) hypertension: Secondary | ICD-10-CM | POA: Diagnosis not present

## 2018-06-23 DIAGNOSIS — R7989 Other specified abnormal findings of blood chemistry: Secondary | ICD-10-CM | POA: Diagnosis not present

## 2018-06-23 DIAGNOSIS — Z1231 Encounter for screening mammogram for malignant neoplasm of breast: Secondary | ICD-10-CM

## 2018-06-23 LAB — COMPREHENSIVE METABOLIC PANEL
ALBUMIN: 3.8 g/dL (ref 3.5–5.0)
ALK PHOS: 98 U/L (ref 38–126)
ALT: 12 U/L (ref 0–44)
AST: 18 U/L (ref 15–41)
Anion gap: 9 (ref 5–15)
BUN: 19 mg/dL (ref 8–23)
CALCIUM: 9.3 mg/dL (ref 8.9–10.3)
CHLORIDE: 108 mmol/L (ref 98–111)
CO2: 26 mmol/L (ref 22–32)
Creatinine, Ser: 1.31 mg/dL — ABNORMAL HIGH (ref 0.44–1.00)
GFR calc non Af Amer: 38 mL/min — ABNORMAL LOW (ref >60)
GFR, EST AFRICAN AMERICAN: 44 mL/min — AB (ref >60)
Glucose, Bld: 98 mg/dL (ref 70–99)
Potassium: 4.3 mmol/L (ref 3.5–5.1)
Sodium: 143 mmol/L (ref 135–145)
Total Bilirubin: 0.5 mg/dL (ref 0.3–1.2)
Total Protein: 6.5 g/dL (ref 6.5–8.1)

## 2018-06-23 LAB — CBC WITH DIFFERENTIAL/PLATELET
Abs Immature Granulocytes: 0.02 10*3/uL (ref 0.00–0.07)
BASOS ABS: 0.1 10*3/uL (ref 0.0–0.1)
Basophils Relative: 1 %
EOS ABS: 0.2 10*3/uL (ref 0.0–0.5)
Eosinophils Relative: 3 %
HEMATOCRIT: 39.6 % (ref 36.0–46.0)
Hemoglobin: 12.5 g/dL (ref 12.0–15.0)
IMMATURE GRANULOCYTES: 0 %
LYMPHS ABS: 1.7 10*3/uL (ref 0.7–4.0)
Lymphocytes Relative: 28 %
MCH: 29.8 pg (ref 26.0–34.0)
MCHC: 31.6 g/dL (ref 30.0–36.0)
MCV: 94.5 fL (ref 80.0–100.0)
Monocytes Absolute: 0.5 10*3/uL (ref 0.1–1.0)
Monocytes Relative: 8 %
NEUTROS PCT: 60 %
NRBC: 0 % (ref 0.0–0.2)
Neutro Abs: 3.7 10*3/uL (ref 1.7–7.7)
PLATELETS: 243 10*3/uL (ref 150–400)
RBC: 4.19 MIL/uL (ref 3.87–5.11)
RDW: 14.8 % (ref 11.5–15.5)
WBC: 6.2 10*3/uL (ref 4.0–10.5)

## 2018-06-23 NOTE — Telephone Encounter (Signed)
Gave avs and calendar ° °

## 2018-07-03 ENCOUNTER — Encounter: Payer: Self-pay | Admitting: Hematology

## 2018-07-04 ENCOUNTER — Telehealth: Payer: Self-pay | Admitting: *Deleted

## 2018-07-04 NOTE — Telephone Encounter (Signed)
Received faxed results of Bone density done  07/03/2018 @ [facility].  Left results on Dr. Ernestina Penna desk for review.

## 2018-07-07 ENCOUNTER — Telehealth: Payer: Self-pay

## 2018-07-07 NOTE — Telephone Encounter (Signed)
Let voice message that Bone Density test results, per Dr. Burr Medico osteoporosis is stable. Sent [facility] result to be scanned into chart.

## 2018-07-13 ENCOUNTER — Telehealth: Payer: Self-pay

## 2018-07-13 NOTE — Telephone Encounter (Signed)
Left voice message for patient regarding Bone Density scan results, per Dr. Burr Medico not a significant change still osteoporosis, instructed to continue taking Fosamax weekly.

## 2019-06-24 NOTE — Progress Notes (Addendum)
[place]   [phone] [phone]   Clinic Follow up Note   Patient Care Team: [name], MD as PCP - General (Family Medicine) Neldon Mc, MD as Surgeon (General Surgery) Nira Retort, MD as Consulting Physician (Hematology and Oncology) 06/25/2019  CHIEF COMPLAINT: F/u left breast cancer   SUMMARY OF ONCOLOGIC HISTORY: Oncology History Overview Note  Breast cancer, left breast   Staging form: Breast, AJCC 7th Edition     Clinical stage from 12/07/2011: Stage IA (T1, N0, cM0) - Signed by Haywood Lasso, MD on 12/07/2011       Prognostic indicators: ER = 100%  PR = 100%  Ki67 = 12%  Her 2 equivocal on BX, 1.26 (no amp) on lumpectomy      Pathologic: Stage IA (T1c, N0, cM0) - Signed by Haywood Lasso, MD on 01/24/2012       Prognostic indicators: ER = 100%  PR = 100%  Ki67 = 12%  Her 2 equivocal on BX, 1.26 (no amp) on lumpectomy      Breast cancer of lower-outer quadrant of left female breast ([place])  12/07/2011 Initial Diagnosis   Breast cancer, left breast   01/02/2012 Surgery   lumpectomy    03/14/2012 - 05/02/2012 Radiation Therapy   35 radiation treatments under the direction of Dr. Sondra Come that was completed on     05/06/2012 - 04/2017 Anti-estrogen oral therapy   Arimidex 1 mg daily   03/16/2016 Mammogram   IMPRESSION: Stable left breast lumpectomy site. No mammographic evidence of malignancy in the bilateral breasts.   09/07/2016 Imaging   US Abdomen Complete IMPRESSION: Post cholecystectomy.  Common bile duct and liver unremarkable.  Slightly prominent pancreatic duct unchanged from 2013. No pancreatic mass identified (tail not visualized secondary to bowel gas).  Mild bilateral renal parenchymal thinning without hydronephrosis.  Aortic atherosclerosis.     CURRENT THERAPY: Surveillance   INTERVAL HISTORY: [name] returns for f/u as scheduled. She was last seen 06/23/18. She is feeling  great. Denies changes in her health since last year. Just had virtual visit with PCP. BP usually 140-150 at home, notes it is always high at doctor visits. Energy level and appetite are normal for her. No recent infection, cough, chest pain, change in bowel habits, bleeding. Exertional dyspnea and asthma at baseline. Uses inhaler. Denies new breast mass, nodularity, nipple or skin changes.    MEDICAL HISTORY:  Past Medical History:  Diagnosis Date  . Allergy    seasonal allergies  . Arthritis    knee and hands  . Breast cancer, left breast ([place]) 12/07/2011  . Breast cancer, left breast ([place]) 11/24/11   left breast 4 o'clock bx=invasive ductal ca,grade II,ER/PR=+  . Diverticulitis   . Full dentures   . GERD (gastroesophageal reflux disease)   . HOH (hard of hearing)    right ear   . Hx of radiation therapy 03/14/12- 05/02/12   left breast 5040 gray in 28 fx, site of presentation lower outer quadrant boosted to 6440 gray  . Hyperlipemia   . Hypertension   . Left knee DJD   . PONV (postoperative nausea and vomiting)    has used scop patch-that helped  . Reflux   . Renal insufficiency 11/09/2011  . Shortness of breath   . Wears glasses     SURGICAL HISTORY: Past Surgical History:  Procedure Laterality Date  . ABDOMINAL HYSTERECTOMY  93   total  . BLADDER SUSPENSION    .  BREAST LUMPECTOMY  01/04/2012   left,invasive ductcl ca, grade II,lloq, lymphovascular and perineural invaion ,DCIS,  . CHOLECYSTECTOMY    . EYE SURGERY     both cataracts  . FRACTURE SURGERY     lt foot  . KNEE SURGERY  2000   rt   . TONSILLECTOMY    . TOTAL KNEE ARTHROPLASTY Left 12/24/2013   Procedure: LEFT TOTAL KNEE ARTHROPLASTY;  Surgeon: Lorn Junes, MD;  Location: [facility];  Service: Orthopedics;  Laterality: Left;  . TUBAL LIGATION      I have reviewed the social history and family history with the patient and they are unchanged from previous note.  ALLERGIES:  is allergic to hydromorphone hcl;  nsaids; and prednisone.  MEDICATIONS:  Current Outpatient Medications  Medication Sig Dispense Refill  . acetaminophen (TYLENOL) 325 MG tablet Take 650 mg by mouth every 6 (six) hours as needed for moderate pain (pain).     Marland Kitchen alendronate (FOSAMAX) 70 MG tablet Take 70 mg by mouth once a week. On Fridays. Take with a full glass of water on an empty stomach.    Marland Kitchen allopurinol (ZYLOPRIM) 300 MG tablet Take 300 mg by mouth daily.    . colchicine 0.6 MG tablet Take 0.6 mg by mouth as needed (for gout flare).     . diphenhydramine-acetaminophen (TYLENOL PM) 25-500 MG TABS Take 2 tablets by mouth at bedtime as needed (sleep).     Marland Kitchen esomeprazole (NEXIUM) 40 MG capsule Take 40 mg by mouth daily.     Marland Kitchen losartan (COZAAR) 100 MG tablet Take 100 mg by mouth daily.    . polycarbophil (FIBERCON) 625 MG tablet Take 625 mg by mouth daily.    Marland Kitchen PROAIR HFA 108 (90 BASE) MCG/ACT inhaler Inhale 2 puffs into the lungs as needed.     . promethazine (PHENERGAN) 12.5 MG tablet Take 12.5 mg by mouth every 6 (six) hours as needed.    . simvastatin (ZOCOR) 40 MG tablet Take 40 mg by mouth every evening.    . solifenacin (VESICARE) 10 MG tablet Take 10 mg by mouth daily.    . traMADol (ULTRAM) 50 MG tablet Take 50 mg by mouth every 6 (six) hours as needed for moderate pain or severe pain (pain).     No current facility-administered medications for this visit.    PHYSICAL EXAMINATION: ECOG PERFORMANCE STATUS: 0 - Asymptomatic  Vitals:   06/25/19 1129 06/25/19 1139  BP: (!) 197/81 (!) 200/90  Pulse:    Resp:    Temp:    SpO2:     Filed Weights   06/25/19 1128  Weight: 174 lb 1.6 oz (79 kg)    GENERAL:alert, no distress and comfortable SKIN: no rash. Excision sites to tip of nose and anterior lower left leg  EYES:  sclera clear LYMPH:  no palpable cervical, supraclavicular, or axillary lymphadenopathy LUNGS: clear with normal breathing effort HEART: regular rate & rhythm, mild lower extremity edema NEURO:  alert & oriented x 3 with fluent speech, normal gait Breast exam: s/p left lumpectomy. Incisions completely healed. Scar tissue to left lower outer breast. No mass in either breast or axilla that I could appreciate. Right outer breast slightly tender.   LABORATORY DATA:  I have reviewed the data as listed CBC Latest Ref Rng & Units 06/25/2019 06/23/2018 06/23/2017  WBC 4.0 - 10.5 K/uL 7.7 6.2 8.4  Hemoglobin 12.0 - 15.0 g/dL 11.9(L) 12.5 12.1  Hematocrit 36.0 - 46.0 % 37.4 39.6 38.3  Platelets 150 - 400 K/uL 227 243 229     CMP Latest Ref Rng & Units 06/25/2019 06/23/2018 06/23/2017  Glucose 70 - 99 mg/dL 80 98 90  BUN 8 - 23 mg/dL _0 Creatinine 0.44 - 1.00 mg/dL 1.17(H) 1.31(H) 1.23(H)  Sodium 135 - 145 mmol/L 145 143 142  Potassium 3.5 - 5.1 mmol/L 4.2 4.3 3.8  Chloride 98 - 111 mmol/L 111 108 108  CO2 22 - 32 mmol/L _1 Calcium 8.9 - 10.3 mg/dL 8.9 9.3 9.8  Total Protein 6.5 - 8.1 g/dL 6.8 6.5 6.4  Total Bilirubin 0.3 - 1.2 mg/dL 0.4 0.5 0.4  Alkaline Phos 38 - 126 U/L 105 98 85  AST 15 - 41 U/L 14(L) 18 13  ALT 0 - 44 U/L _2 RADIOGRAPHIC STUDIES: I have personally reviewed the radiological images as listed and agreed with the findings in the report. No results found.   ASSESSMENT & PLAN: Melissa Morton is a 86 y.o. female with    1. Breast cancer of lower-outer quadrant of left female breast,pT1cN0M0, stage I, ER+/PR+, HER2- -She was diagnosed in 12/2011. She is s/p left breast lumpectomy, adjuvant radiation and 5 years of anti-estrogen therapy with Anastrozole.  -Ms. Nesler is clinically doing well. CBC normal except Hg 11.9; normal MCV and differential. CMP stable. Breast exam is benign. No clinical concern for recurrence.  -She had [facility] on 03[facility]2019 which I have requested.  -Continue breast cancer surveillance, self exam, and mammogram in 03/2020.  -We reviewed signs of breast cancer recurrence/metastasis  -she prefers to f/u  with [place], will see her back with lab in 1 year.   2.Osteoporosis  -Osteopenia in 2015, and 04/2016 DEXA showed osteoporosis of Left distal radius, T score -2.5. -DEXA in 07/2018 mostly stable, Continue calcium,vitamin D andFosamax which she is tolerating well. -repeat 07/2020 per PCP  3. Hypertension, arthritis  -on losartan, followed by PCP -BP elevated in clinic, asymptomatic. She reports SBP 140-150 at home  4. Skin cancer of anterior lower left leg, tip of nose -F/u dermatology   PLAN: -Labs reviewed -Requested 03/2019 mammogram from [facility]  -Fax CBC/CMP to PCP -Annual mammogram 03/2020 and lab/fu 06/2020  No problem-specific Assessment & Plan notes found for this encounter.   Orders Placed This Encounter  Procedures  . MM Digital Screening    Standing Status:   Future    Standing Expiration Date:   06/24/2020    Scheduling Instructions:     [facility]    Order Specific Question:   Reason for Exam (SYMPTOM  OR DIAGNOSIS REQUIRED)    Answer:   h/o left breast cancer    Order Specific Question:   Preferred imaging location?    Answer:   External   All questions were answered. The patient knows to call the clinic with any problems, questions or concerns. No barriers to learning was detected.     Alla Feeling, NP 06/25/19   Addendum: Her recent mammogram on 03/26/2019 was personally reviewed, no evidence of malignancy.   Cira Rue, NP  06/25/19

## 2019-06-25 ENCOUNTER — Inpatient Hospital Stay: Payer: Medicare Other | Attending: Nurse Practitioner | Admitting: Nurse Practitioner

## 2019-06-25 ENCOUNTER — Telehealth: Payer: Self-pay

## 2019-06-25 ENCOUNTER — Encounter: Payer: Self-pay | Admitting: Nurse Practitioner

## 2019-06-25 ENCOUNTER — Other Ambulatory Visit: Payer: Self-pay

## 2019-06-25 ENCOUNTER — Inpatient Hospital Stay: Payer: Medicare Other

## 2019-06-25 VITALS — BP 200/90 | HR 70 | Temp 98.1°F | Resp 17 | Ht 64.0 in | Wt 174.1 lb

## 2019-06-25 DIAGNOSIS — Z170 Estrogen receptor positive status [ER+]: Secondary | ICD-10-CM

## 2019-06-25 DIAGNOSIS — R0609 Other forms of dyspnea: Secondary | ICD-10-CM | POA: Diagnosis not present

## 2019-06-25 DIAGNOSIS — C50512 Malignant neoplasm of lower-outer quadrant of left female breast: Secondary | ICD-10-CM

## 2019-06-25 DIAGNOSIS — Z79899 Other long term (current) drug therapy: Secondary | ICD-10-CM | POA: Diagnosis not present

## 2019-06-25 DIAGNOSIS — C44709 Unspecified malignant neoplasm of skin of left lower limb, including hip: Secondary | ICD-10-CM | POA: Diagnosis not present

## 2019-06-25 DIAGNOSIS — I10 Essential (primary) hypertension: Secondary | ICD-10-CM | POA: Insufficient documentation

## 2019-06-25 DIAGNOSIS — Z1231 Encounter for screening mammogram for malignant neoplasm of breast: Secondary | ICD-10-CM

## 2019-06-25 DIAGNOSIS — J45909 Unspecified asthma, uncomplicated: Secondary | ICD-10-CM | POA: Diagnosis not present

## 2019-06-25 DIAGNOSIS — I700 Atherosclerosis of aorta: Secondary | ICD-10-CM | POA: Diagnosis not present

## 2019-06-25 DIAGNOSIS — C44301 Unspecified malignant neoplasm of skin of nose: Secondary | ICD-10-CM | POA: Diagnosis not present

## 2019-06-25 DIAGNOSIS — Z923 Personal history of irradiation: Secondary | ICD-10-CM | POA: Insufficient documentation

## 2019-06-25 DIAGNOSIS — Z886 Allergy status to analgesic agent status: Secondary | ICD-10-CM | POA: Diagnosis not present

## 2019-06-25 DIAGNOSIS — Z9049 Acquired absence of other specified parts of digestive tract: Secondary | ICD-10-CM | POA: Insufficient documentation

## 2019-06-25 DIAGNOSIS — M810 Age-related osteoporosis without current pathological fracture: Secondary | ICD-10-CM | POA: Insufficient documentation

## 2019-06-25 DIAGNOSIS — M1990 Unspecified osteoarthritis, unspecified site: Secondary | ICD-10-CM | POA: Insufficient documentation

## 2019-06-25 DIAGNOSIS — Z888 Allergy status to other drugs, medicaments and biological substances status: Secondary | ICD-10-CM | POA: Insufficient documentation

## 2019-06-25 DIAGNOSIS — Z79811 Long term (current) use of aromatase inhibitors: Secondary | ICD-10-CM | POA: Diagnosis not present

## 2019-06-25 LAB — CBC WITH DIFFERENTIAL/PLATELET
Abs Immature Granulocytes: 0.02 10*3/uL (ref 0.00–0.07)
Basophils Absolute: 0.1 10*3/uL (ref 0.0–0.1)
Basophils Relative: 1 %
Eosinophils Absolute: 0.2 10*3/uL (ref 0.0–0.5)
Eosinophils Relative: 3 %
HCT: 37.4 % (ref 36.0–46.0)
Hemoglobin: 11.9 g/dL — ABNORMAL LOW (ref 12.0–15.0)
Immature Granulocytes: 0 %
Lymphocytes Relative: 29 %
Lymphs Abs: 2.3 10*3/uL (ref 0.7–4.0)
MCH: 29.6 pg (ref 26.0–34.0)
MCHC: 31.8 g/dL (ref 30.0–36.0)
MCV: 93 fL (ref 80.0–100.0)
Monocytes Absolute: 0.7 10*3/uL (ref 0.1–1.0)
Monocytes Relative: 9 %
Neutro Abs: 4.4 10*3/uL (ref 1.7–7.7)
Neutrophils Relative %: 58 %
Platelets: 227 10*3/uL (ref 150–400)
RBC: 4.02 MIL/uL (ref 3.87–5.11)
RDW: 14.5 % (ref 11.5–15.5)
WBC: 7.7 10*3/uL (ref 4.0–10.5)
nRBC: 0 % (ref 0.0–0.2)

## 2019-06-25 LAB — COMPREHENSIVE METABOLIC PANEL
ALT: 12 U/L (ref 0–44)
AST: 14 U/L — ABNORMAL LOW (ref 15–41)
Albumin: 3.8 g/dL (ref 3.5–5.0)
Alkaline Phosphatase: 105 U/L (ref 38–126)
Anion gap: 9 (ref 5–15)
BUN: 22 mg/dL (ref 8–23)
CO2: 25 mmol/L (ref 22–32)
Calcium: 8.9 mg/dL (ref 8.9–10.3)
Chloride: 111 mmol/L (ref 98–111)
Creatinine, Ser: 1.17 mg/dL — ABNORMAL HIGH (ref 0.44–1.00)
GFR calc Af Amer: 50 mL/min — ABNORMAL LOW (ref >60)
GFR calc non Af Amer: 43 mL/min — ABNORMAL LOW (ref >60)
Glucose, Bld: 80 mg/dL (ref 70–99)
Potassium: 4.2 mmol/L (ref 3.5–5.1)
Sodium: 145 mmol/L (ref 135–145)
Total Bilirubin: 0.4 mg/dL (ref 0.3–1.2)
Total Protein: 6.8 g/dL (ref 6.5–8.1)

## 2019-06-25 NOTE — Telephone Encounter (Signed)
Spoke with patient regarding her appointment today. Pt verbalizes that she would like to come in to [facility] instead of a phone visit today at her appointment time.

## 2019-06-26 ENCOUNTER — Telehealth: Payer: Self-pay | Admitting: Nurse Practitioner

## 2019-06-26 NOTE — Telephone Encounter (Signed)
Scheduled appt per 2/22 los.  Sent a message to HIM pool to get a calendar mailed out. 

## 2020-01-29 ENCOUNTER — Other Ambulatory Visit: Payer: Self-pay

## 2020-01-29 ENCOUNTER — Ambulatory Visit (INDEPENDENT_AMBULATORY_CARE_PROVIDER_SITE_OTHER): Payer: Medicare Other

## 2020-01-29 ENCOUNTER — Ambulatory Visit
Admission: EM | Admit: 2020-01-29 | Discharge: 2020-01-29 | Disposition: A | Discharge status: Home / Self Care | Payer: Medicare Other | Attending: Family Medicine | Admitting: Family Medicine

## 2020-01-29 DIAGNOSIS — R52 Pain, unspecified: Secondary | ICD-10-CM

## 2020-01-29 DIAGNOSIS — M79604 Pain in right leg: Secondary | ICD-10-CM

## 2020-01-29 DIAGNOSIS — W19XXXA Unspecified fall, initial encounter: Secondary | ICD-10-CM | POA: Diagnosis not present

## 2020-01-29 MED ORDER — GABAPENTIN 100 MG PO CAPS: 100.0000 mg | ORAL_CAPSULE | Freq: Three times a day (TID) | ORAL | 1 refills | Status: DC

## 2020-01-29 NOTE — ED Triage Notes (Signed)
Pt states tripped and fell a wk ago landing on her rt side and bruising rt eye. States having rt upper leg burning/pain x2 days. Denies loc

## 2020-01-29 NOTE — ED Provider Notes (Signed)
[facility]    CSN: [id number] Arrival date & time: 01/29/20  0850      History   Chief Complaint Chief Complaint  Patient presents with  . Leg Pain    HPI Melissa Morton is a 86 y.o. female.   Patient has burning pain in her right upper leg.  She fell, tripping over a toy 1 week ago.  The burning pain has just started more recently.  She is able to ambulate okay and she agrees with me there is not likely a fracture given the temporal sequence of her pain.  The burning extends only to the knee, not the foot.  She denies back pain or prior history of disc disease. Did question her about compliance of blood pressure medicine and she tells me she takes her medicine but blood pressure goes up when she goes to the doctor.  HPI  Past Medical History:  Diagnosis Date  . Allergy    seasonal allergies  . Arthritis    knee and hands  . Breast cancer, left breast ([place]) 12/07/2011  . Breast cancer, left breast ([place]) 11/24/11   left breast 4 o'clock bx=invasive ductal ca,grade II,ER/PR=+  . Diverticulitis   . Full dentures   . GERD (gastroesophageal reflux disease)   . HOH (hard of hearing)    right ear   . Hx of radiation therapy 03/14/12- 05/02/12   left breast 5040 gray in 28 fx, site of presentation lower outer quadrant boosted to 6440 gray  . Hyperlipemia   . Hypertension   . Left knee DJD   . PONV (postoperative nausea and vomiting)    has used scop patch-that helped  . Reflux   . Renal insufficiency 11/09/2011  . Shortness of breath   . Wears glasses     Patient Active Problem List   Diagnosis Date Noted  . UTI (urinary tract infection) Klebsiella 12/25/2013  . DJD (degenerative joint disease) of knee 12/24/2013  . PONV (postoperative nausea and vomiting)   . GERD (gastroesophageal reflux disease)   . Hypertension   . Left knee DJD   . Osteoporosis 03/20/2013  . Hx of radiation therapy   . Breast cancer of lower-outer quadrant of left female breast ([place])  12/07/2011  . Leucocytosis 11/09/2011  . Renal insufficiency 11/09/2011  . Gout 11/09/2011    Past Surgical History:  Procedure Laterality Date  . ABDOMINAL HYSTERECTOMY  93   total  . BLADDER SUSPENSION    . BREAST LUMPECTOMY  01/04/2012   left,invasive ductcl ca, grade II,lloq, lymphovascular and perineural invaion ,DCIS,  . CHOLECYSTECTOMY    . EYE SURGERY     both cataracts  . FRACTURE SURGERY     lt foot  . KNEE SURGERY  2000   rt   . TONSILLECTOMY    . TOTAL KNEE ARTHROPLASTY Left 12/24/2013   Procedure: LEFT TOTAL KNEE ARTHROPLASTY;  Surgeon: Lorn Junes, MD;  Location: [facility];  Service: Orthopedics;  Laterality: Left;  . TUBAL LIGATION      OB History   No obstetric history on file.      Home Medications    Prior to Admission medications   Medication Sig Start Date End Date Taking? Authorizing Provider  acetaminophen (TYLENOL) 325 MG tablet Take 650 mg by mouth every 6 (six) hours as needed for moderate pain (pain).     [provider]  alendronate (FOSAMAX) 70 MG tablet Take 70 mg by mouth once a week. On Fridays.  Take with a full glass of water on an empty stomach.    [provider]  allopurinol (ZYLOPRIM) 300 MG tablet Take 300 mg by mouth daily.    [provider]  colchicine 0.6 MG tablet Take 0.6 mg by mouth as needed (for gout flare).     [provider]  diphenhydramine-acetaminophen (TYLENOL PM) 25-500 MG TABS Take 2 tablets by mouth at bedtime as needed (sleep).     [provider]  esomeprazole (NEXIUM) 40 MG capsule Take 40 mg by mouth daily.  10/01/14   [provider]  losartan (COZAAR) 100 MG tablet Take 100 mg by mouth daily.    [provider]  polycarbophil (FIBERCON) 625 MG tablet Take 625 mg by mouth daily.    [provider]  PROAIR HFA 108 (90 BASE) MCG/ACT inhaler Inhale 2 puffs into the lungs as needed.  02/04/15   [provider]  promethazine (PHENERGAN) 12.5  MG tablet Take 12.5 mg by mouth every 6 (six) hours as needed. 06/18/19   [provider]  simvastatin (ZOCOR) 40 MG tablet Take 40 mg by mouth every evening.    [provider]  solifenacin (VESICARE) 10 MG tablet Take 10 mg by mouth daily.    [provider]  traMADol (ULTRAM) 50 MG tablet Take 50 mg by mouth every 6 (six) hours as needed for moderate pain or severe pain (pain).    [provider]    Family History Family History  Problem Relation Age of Onset  . Breast cancer Sister 77  . Leukemia Sister   . Heart disease Father   . Breast cancer Maternal Aunt 36  . Lung cancer Cousin        smoker    Social History Social History   Tobacco Use  . Smoking status: Former Smoker    Packs/day: 0.25    Years: 20.00    Pack years: 5.00    Types: Cigarettes    Quit date: 11/09/1991    Years since quitting: 28.2  . Smokeless tobacco: Never Used  Substance Use Topics  . Alcohol use: Yes    Comment: socially  . Drug use: No     Allergies   Hydromorphone hcl, Nsaids, and Prednisone   Review of Systems Review of Systems  Musculoskeletal: Positive for arthralgias.  All other systems reviewed and are negative.    Physical Exam Triage Vital Signs ED Triage Vitals [01/29/20 1007]  Enc Vitals Group     BP (!) 205/102     Pulse Rate 81     Resp 18     Temp 98.1 F (36.7 C)     Temp Source Oral     SpO2 98 %     Weight      Height      Head Circumference      Peak Flow      Pain Score 6     Pain Loc      Pain Edu?      Excl. in [place]?    No data found.  Updated Vital Signs BP (!) 205/102 (BP Location: Left Arm)   Pulse 81   Temp 98.1 F (36.7 C) (Oral)   Resp 18   SpO2 98%   Visual Acuity Right Eye Distance:   Left Eye Distance:   Bilateral Distance:    Right Eye Near:   Left Eye Near:    Bilateral Near:     Physical Exam Vitals and  nursing note reviewed.  Constitutional:      Appearance: Normal appearance.    Cardiovascular:     Rate and Rhythm: Normal rate.  Pulmonary:     Effort: Pulmonary effort is normal.  Musculoskeletal:     Comments: Right leg: There is no deformity or tenderness to palpation except over the knee where there is a bruise from the fall.  Reflexes seem appropriate.  Straight leg raising is negative. On palpation of the back there is some tenderness in the lower lumbar region as well as hip.  Neurological:     Mental Status: She is alert.      UC Treatments / Results  Labs (all labs ordered are listed, but only abnormal results are displayed) Labs Reviewed - No data to display  EKG   Radiology No fracture seen No results found.  Procedures Procedures (including critical care time)  Medications Ordered in UC Medications - No data to display  Initial Impression / Assessment and Plan / UC Course  I have reviewed the triage vital signs and the nursing notes.  Pertinent labs & imaging results that were available during my care of the patient were reviewed by me and considered in my medical decision making (see chart for details).     Fall with burning pain in right upper leg.  X-ray negative.  Suspect some neuropathy secondary to nerve contusion Final Clinical Impressions(s) / UC Diagnoses   Final diagnoses:  None   Discharge Instructions   None    ED Prescriptions    None     PDMP not reviewed this encounter.   Wardell Honour, MD 01/29/20 [phone]

## 2020-01-30 ENCOUNTER — Other Ambulatory Visit: Payer: Self-pay | Admitting: Family Medicine

## 2020-01-30 ENCOUNTER — Ambulatory Visit
Admission: RE | Admit: 2020-01-30 | Discharge: 2020-01-30 | Disposition: A | Discharge status: Home / Self Care | Payer: Medicare Other | Source: Ambulatory Visit | Attending: Family Medicine | Admitting: Family Medicine

## 2020-01-30 DIAGNOSIS — M25551 Pain in right hip: Secondary | ICD-10-CM

## 2020-06-16 DIAGNOSIS — L570 Actinic keratosis: Secondary | ICD-10-CM | POA: Diagnosis not present

## 2020-06-16 DIAGNOSIS — L309 Dermatitis, unspecified: Secondary | ICD-10-CM | POA: Diagnosis not present

## 2020-06-23 NOTE — Progress Notes (Incomplete)
[facility]   Telephone:([phone]) [phone] Fax:(336) [phone]   Clinic Follow up Note   Patient Care Team: [name], MD as PCP - General (Family Medicine) Neldon Mc, MD as Surgeon (General Surgery) Nira Retort, MD as Consulting Physician (Hematology and Oncology)  Date of Service:  06/23/2020  CHIEF COMPLAINT: Follow up left breast cancer  SUMMARY OF ONCOLOGIC HISTORY: Oncology History Overview Note  Breast cancer, left breast   Staging form: Breast, AJCC 7th Edition     Clinical stage from 12/07/2011: Stage IA (T1, N0, cM0) - Signed by Haywood Lasso, MD on 12/07/2011       Prognostic indicators: ER = 100%  PR = 100%  Ki67 = 12%  Her 2 equivocal on BX, 1.26 (no amp) on lumpectomy      Pathologic: Stage IA (T1c, N0, cM0) - Signed by Haywood Lasso, MD on 01/24/2012       Prognostic indicators: ER = 100%  PR = 100%  Ki67 = 12%  Her 2 equivocal on BX, 1.26 (no amp) on lumpectomy      Breast cancer of lower-outer quadrant of left female breast ([place])  12/07/2011 Initial Diagnosis   Breast cancer, left breast   01/02/2012 Surgery   lumpectomy    03/14/2012 - 05/02/2012 Radiation Therapy   35 radiation treatments under the direction of Dr. Sondra Come that was completed on     05/06/2012 - 04/2017 Anti-estrogen oral therapy   Arimidex 1 mg daily   03/16/2016 Mammogram   IMPRESSION: Stable left breast lumpectomy site. No mammographic evidence of malignancy in the bilateral breasts.   09/07/2016 Imaging   US Abdomen Complete IMPRESSION: Post cholecystectomy.  Common bile duct and liver unremarkable.  Slightly prominent pancreatic duct unchanged from 2013. No pancreatic mass identified (tail not visualized secondary to bowel gas).  Mild bilateral renal parenchymal thinning without hydronephrosis.  Aortic atherosclerosis.      CURRENT THERAPY:  Surveillance  INTERVAL HISTORY: *** Melissa Morton is here for a follow up of left  breast cancer. She was last seen by me 2 years ago and seen by NP Lacie 1 year ago in interim. She .presents to the clinic alone.    REVIEW OF SYSTEMS:  *** Constitutional: Denies fevers, chills or abnormal weight loss Eyes: Denies blurriness of vision Ears, nose, mouth, throat, and face: Denies mucositis or sore throat Respiratory: Denies cough, dyspnea or wheezes Cardiovascular: Denies palpitation, chest discomfort or lower extremity swelling Gastrointestinal:  Denies nausea, heartburn or change in bowel habits Skin: Denies abnormal skin rashes Lymphatics: Denies new lymphadenopathy or easy bruising Neurological:Denies numbness, tingling or new weaknesses Behavioral/Psych: Mood is stable, no new changes  All other systems were reviewed with the patient and are negative.  MEDICAL HISTORY:  Past Medical History:  Diagnosis Date  . Allergy    seasonal allergies  . Arthritis    knee and hands  . Breast cancer, left breast ([place]) 12/07/2011  . Breast cancer, left breast ([place]) 11/24/11   left breast 4 o'clock bx=invasive ductal ca,grade II,ER/PR=+  . Diverticulitis   . Full dentures   . GERD (gastroesophageal reflux disease)   . HOH (hard of hearing)    right ear   . Hx of radiation therapy 03/14/12- 05/02/12   left breast 5040 gray in 28 fx, site of presentation lower outer quadrant boosted to 6440 gray  . Hyperlipemia   . Hypertension   . Left knee DJD   . PONV (postoperative nausea and  vomiting)    has used scop patch-that helped  . Reflux   . Renal insufficiency 11/09/2011  . Shortness of breath   . Wears glasses     SURGICAL HISTORY: Past Surgical History:  Procedure Laterality Date  . ABDOMINAL HYSTERECTOMY  93   total  . BLADDER SUSPENSION    . BREAST LUMPECTOMY  01/04/2012   left,invasive ductcl ca, grade II,lloq, lymphovascular and perineural invaion ,DCIS,  . CHOLECYSTECTOMY    . EYE SURGERY     both cataracts  . FRACTURE SURGERY     lt foot  . KNEE SURGERY   2000   rt   . TONSILLECTOMY    . TOTAL KNEE ARTHROPLASTY Left 12/24/2013   Procedure: LEFT TOTAL KNEE ARTHROPLASTY;  Surgeon: Lorn Junes, MD;  Location: [facility];  Service: Orthopedics;  Laterality: Left;  . TUBAL LIGATION      I have reviewed the social history and family history with the patient and they are unchanged from previous note.  ALLERGIES:  is allergic to hydromorphone hcl, nsaids, and prednisone.  MEDICATIONS:  Current Outpatient Medications  Medication Sig Dispense Refill  . acetaminophen (TYLENOL) 325 MG tablet Take 650 mg by mouth every 6 (six) hours as needed for moderate pain (pain).     Marland Kitchen alendronate (FOSAMAX) 70 MG tablet Take 70 mg by mouth once a week. On Fridays. Take with a full glass of water on an empty stomach.    Marland Kitchen allopurinol (ZYLOPRIM) 300 MG tablet Take 300 mg by mouth daily.    . colchicine 0.6 MG tablet Take 0.6 mg by mouth as needed (for gout flare).     . diphenhydramine-acetaminophen (TYLENOL PM) 25-500 MG TABS Take 2 tablets by mouth at bedtime as needed (sleep).     Marland Kitchen esomeprazole (NEXIUM) 40 MG capsule Take 40 mg by mouth daily.     Marland Kitchen gabapentin (NEURONTIN) 100 MG capsule Take 1 capsule (100 mg total) by mouth 3 (three) times daily. 20 capsule 1  . losartan (COZAAR) 100 MG tablet Take 100 mg by mouth daily.    . polycarbophil (FIBERCON) 625 MG tablet Take 625 mg by mouth daily.    Marland Kitchen PROAIR HFA 108 (90 BASE) MCG/ACT inhaler Inhale 2 puffs into the lungs as needed.     . promethazine (PHENERGAN) 12.5 MG tablet Take 12.5 mg by mouth every 6 (six) hours as needed.    . simvastatin (ZOCOR) 40 MG tablet Take 40 mg by mouth every evening.    . solifenacin (VESICARE) 10 MG tablet Take 10 mg by mouth daily.    . traMADol (ULTRAM) 50 MG tablet Take 50 mg by mouth every 6 (six) hours as needed for moderate pain or severe pain (pain).     No current facility-administered medications for this visit.    PHYSICAL EXAMINATION: ECOG PERFORMANCE STATUS: {CHL  ONC ECOG PS:[phone]}  There were no vitals filed for this visit. There were no vitals filed for this visit. *** GENERAL:alert, no distress and comfortable SKIN: skin color, texture, turgor are normal, no rashes or significant lesions EYES: normal, Conjunctiva are pink and non-injected, sclera clear {OROPHARYNX:no exudate, no erythema and lips, buccal mucosa, and tongue normal}  NECK: supple, thyroid normal size, non-tender, without nodularity LYMPH:  no palpable lymphadenopathy in the cervical, axillary {or inguinal} LUNGS: clear to auscultation and percussion with normal breathing effort HEART: regular rate & rhythm and no murmurs and no lower extremity edema ABDOMEN:abdomen soft, non-tender and normal bowel sounds Musculoskeletal:no cyanosis  of digits and no clubbing  NEURO: alert & oriented x 3 with fluent speech, no focal motor/sensory deficits  LABORATORY DATA:  I have reviewed the data as listed CBC Latest Ref Rng & Units 06/25/2019 06/23/2018 06/23/2017  WBC 4.0 - 10.5 K/uL 7.7 6.2 8.4  Hemoglobin 12.0 - 15.0 g/dL 11.9(L) 12.5 12.1  Hematocrit 36.0 - 46.0 % 37.4 39.6 38.3  Platelets 150 - 400 K/uL 227 243 229     CMP Latest Ref Rng & Units 06/25/2019 06/23/2018 06/23/2017  Glucose 70 - 99 mg/dL 80 98 90  BUN 8 - 23 mg/dL _0 Creatinine 0.44 - 1.00 mg/dL 1.17(H) 1.31(H) 1.23(H)  Sodium 135 - 145 mmol/L 145 143 142  Potassium 3.5 - 5.1 mmol/L 4.2 4.3 3.8  Chloride 98 - 111 mmol/L 111 108 108  CO2 22 - 32 mmol/L _1 Calcium 8.9 - 10.3 mg/dL 8.9 9.3 9.8  Total Protein 6.5 - 8.1 g/dL 6.8 6.5 6.4  Total Bilirubin 0.3 - 1.2 mg/dL 0.4 0.5 0.4  Alkaline Phos 38 - 126 U/L 105 98 85  AST 15 - 41 U/L 14(L) 18 13  ALT 0 - 44 U/L _2 RADIOGRAPHIC STUDIES: I have personally reviewed the radiological images as listed and agreed with the findings in the report. No results found.   ASSESSMENT & PLAN:  Melissa Morton is a 88 y.o. female with   1. Breast  cancer of lower-outer quadrant of left female breast,pT1cN0M0, stage I, ER+/PR+, HER2- -She was diagnosed in 12/2011. She is s/p left breast lumpectomy, adjuvant radiation and 5 years of anti-estrogen therapy with Anastrozole.  -She is clinically doing well. Lab reviewed, her CBC WNL and CMP was unremarkable except a mildly elevated creatinine. Her physical exam and her 03/2018 mammogram were unremarkable. There is no clinical concern for recurrence. -I disucssed with her cancer there is a small risk of distant recurrence. I encouraged her to continue yearly mammograms, exams and labs.   -I also encouraged her to continue healthy diet and be physically active.  -F/u with NP Lacie in 1 year and follow up with her PCP in interim.    2.Osteoporosis  -I previously reviewed her bone density scan results from 2015. She has osteopenia. Her 04/2016 DEXA showed osteoporosis of Left distal radius, T score -2.5. -She will follow-up bone density scan with her primary care physician every 2 years. -Continue calcium,vitamin D andFosamax, tolerating well. -Her next DEXA is next week.   3. Hypertension, arthritis  -She'll continue follow-up with her primary care physician, every 6 months. -She continues to take Losartan daily. -On tramadol for arthritis. I encouraged her to remain active.  -BP elevated at 175/82 today (06/23/18). I encouraged her to monitor at home.   4. Skin cancer of anterior lower left leg  -S/p removal  -She is mildly concerned of another skin lesion on her nose. I disucssed if she noticing any change she should return to her Dermatologist.    PLAN: -She is clinically doing well, Continue surveillance  -Mammogram in 03/2019 at [facility] and f/u in one year with Lacie     No problem-specific Assessment & Plan notes found for this encounter.   No orders of the defined types were placed in this encounter.  All questions were answered. The patient knows to call the  clinic with any problems, questions or concerns. No barriers to learning was detected. The total time spent  in the appointment was {CHL ONC TIME VISIT - WYOVZ:[id number]}.     Joslyn Devon 06/23/2020   Oneal Deputy, am acting as scribe for Truitt Merle, MD.   {Add scribe attestation statement}

## 2020-06-25 ENCOUNTER — Inpatient Hospital Stay: Payer: Medicare Other | Attending: Hematology | Admitting: Hematology

## 2020-06-25 ENCOUNTER — Inpatient Hospital Stay: Payer: Medicare Other

## 2020-06-25 DIAGNOSIS — Z170 Estrogen receptor positive status [ER+]: Secondary | ICD-10-CM

## 2020-06-26 ENCOUNTER — Telehealth: Payer: Self-pay | Admitting: Hematology

## 2020-06-26 NOTE — Telephone Encounter (Signed)
Called pt per 2/23 sch msg - no answer. Left message for pt to call back to reschedule appt.

## 2020-07-17 DIAGNOSIS — D1801 Hemangioma of skin and subcutaneous tissue: Secondary | ICD-10-CM | POA: Diagnosis not present

## 2020-07-17 DIAGNOSIS — L309 Dermatitis, unspecified: Secondary | ICD-10-CM | POA: Diagnosis not present

## 2020-08-11 DIAGNOSIS — Z85828 Personal history of other malignant neoplasm of skin: Secondary | ICD-10-CM | POA: Diagnosis not present

## 2020-08-11 DIAGNOSIS — L821 Other seborrheic keratosis: Secondary | ICD-10-CM | POA: Diagnosis not present

## 2020-08-11 DIAGNOSIS — D225 Melanocytic nevi of trunk: Secondary | ICD-10-CM | POA: Diagnosis not present

## 2020-08-11 DIAGNOSIS — L814 Other melanin hyperpigmentation: Secondary | ICD-10-CM | POA: Diagnosis not present

## 2020-08-11 DIAGNOSIS — L578 Other skin changes due to chronic exposure to nonionizing radiation: Secondary | ICD-10-CM | POA: Diagnosis not present

## 2020-08-19 DIAGNOSIS — M7062 Trochanteric bursitis, left hip: Secondary | ICD-10-CM | POA: Diagnosis not present

## 2020-09-15 DIAGNOSIS — U071 COVID-19: Secondary | ICD-10-CM | POA: Diagnosis not present

## 2020-09-15 DIAGNOSIS — R059 Cough, unspecified: Secondary | ICD-10-CM | POA: Diagnosis not present

## 2020-09-21 ENCOUNTER — Encounter (HOSPITAL_BASED_OUTPATIENT_CLINIC_OR_DEPARTMENT_OTHER): Payer: Self-pay

## 2020-09-21 ENCOUNTER — Other Ambulatory Visit: Payer: Self-pay

## 2020-09-21 ENCOUNTER — Emergency Department (HOSPITAL_BASED_OUTPATIENT_CLINIC_OR_DEPARTMENT_OTHER)
Admission: EM | Admit: 2020-09-21 | Discharge: 2020-09-21 | Disposition: A | Discharge status: Home / Self Care | Payer: Medicare Other | Attending: Emergency Medicine | Admitting: Emergency Medicine

## 2020-09-21 ENCOUNTER — Emergency Department (HOSPITAL_BASED_OUTPATIENT_CLINIC_OR_DEPARTMENT_OTHER): Payer: Medicare Other

## 2020-09-21 DIAGNOSIS — Z923 Personal history of irradiation: Secondary | ICD-10-CM | POA: Insufficient documentation

## 2020-09-21 DIAGNOSIS — I10 Essential (primary) hypertension: Secondary | ICD-10-CM | POA: Insufficient documentation

## 2020-09-21 DIAGNOSIS — R059 Cough, unspecified: Secondary | ICD-10-CM | POA: Diagnosis present

## 2020-09-21 DIAGNOSIS — Z853 Personal history of malignant neoplasm of breast: Secondary | ICD-10-CM | POA: Insufficient documentation

## 2020-09-21 DIAGNOSIS — Z96652 Presence of left artificial knee joint: Secondary | ICD-10-CM | POA: Insufficient documentation

## 2020-09-21 DIAGNOSIS — Z87891 Personal history of nicotine dependence: Secondary | ICD-10-CM | POA: Insufficient documentation

## 2020-09-21 DIAGNOSIS — Z79899 Other long term (current) drug therapy: Secondary | ICD-10-CM | POA: Insufficient documentation

## 2020-09-21 DIAGNOSIS — U071 COVID-19: Secondary | ICD-10-CM

## 2020-09-21 DIAGNOSIS — R0602 Shortness of breath: Secondary | ICD-10-CM | POA: Diagnosis not present

## 2020-09-21 DIAGNOSIS — J9811 Atelectasis: Secondary | ICD-10-CM | POA: Diagnosis not present

## 2020-09-21 NOTE — ED Provider Notes (Signed)
[place] EMERGENCY DEPT Provider Note   CSN: [id number] Arrival date & time: 09/21/20  1454     History Chief Complaint  Patient presents with  . Covid Positive    Melissa PIGGOTT is a 87 y.o. female.  HPI       Presents with concern for cough, dr advised her to come for cxr and labs Symptoms 5/13, dagnosed 5/16 Daughter called because it didn't seem like she was getting better Low appetite, Trying to eat, taking some cough medicine, gave her a medication but made her sick so stopped taking it. Tessalon No shortness of breath  Has not had vaccines  Past Medical History:  Diagnosis Date  . Allergy    seasonal allergies  . Arthritis    knee and hands  . Breast cancer, left breast ([place]) 12/07/2011  . Breast cancer, left breast ([place]) 11/24/11   left breast 4 o'clock bx=invasive ductal ca,grade II,ER/PR=+  . Diverticulitis   . Full dentures   . GERD (gastroesophageal reflux disease)   . HOH (hard of hearing)    right ear   . Hx of radiation therapy 03/14/12- 05/02/12   left breast 5040 gray in 28 fx, site of presentation lower outer quadrant boosted to 6440 gray  . Hyperlipemia   . Hypertension   . Left knee DJD   . PONV (postoperative nausea and vomiting)    has used scop patch-that helped  . Reflux   . Renal insufficiency 11/09/2011  . Shortness of breath   . Wears glasses     Patient Active Problem List   Diagnosis Date Noted  . UTI (urinary tract infection) Klebsiella 12/25/2013  . DJD (degenerative joint disease) of knee 12/24/2013  . PONV (postoperative nausea and vomiting)   . GERD (gastroesophageal reflux disease)   . Hypertension   . Left knee DJD   . Osteoporosis 03/20/2013  . Hx of radiation therapy   . Breast cancer of lower-outer quadrant of left female breast ([place]) 12/07/2011  . Leucocytosis 11/09/2011  . Renal insufficiency 11/09/2011  . Gout 11/09/2011    Past Surgical History:  Procedure Laterality Date  . ABDOMINAL  HYSTERECTOMY  93   total  . BLADDER SUSPENSION    . BREAST LUMPECTOMY  01/04/2012   left,invasive ductcl ca, grade II,lloq, lymphovascular and perineural invaion ,DCIS,  . CHOLECYSTECTOMY    . EYE SURGERY     both cataracts  . FRACTURE SURGERY     lt foot  . KNEE SURGERY  2000   rt   . TONSILLECTOMY    . TOTAL KNEE ARTHROPLASTY Left 12/24/2013   Procedure: LEFT TOTAL KNEE ARTHROPLASTY;  Surgeon: Lorn Junes, MD;  Location: [place];  Service: Orthopedics;  Laterality: Left;  . TUBAL LIGATION       OB History   No obstetric history on file.     Family History  Problem Relation Age of Onset  . Breast cancer Sister 30  . Leukemia Sister   . Heart disease Father   . Breast cancer Maternal Aunt 36  . Lung cancer Cousin        smoker    Social History   Tobacco Use  . Smoking status: Former Smoker    Packs/day: 0.25    Years: 20.00    Pack years: 5.00    Types: Cigarettes    Quit date: 11/09/1991    Years since quitting: 28.8  . Smokeless tobacco: Never Used  Vaping Use  . Vaping Use:  Never used  Substance Use Topics  . Alcohol use: Yes    Comment: socially  . Drug use: No    Home Medications Prior to Admission medications   Medication Sig Start Date End Date Taking? Authorizing Provider  acetaminophen (TYLENOL) 325 MG tablet Take 650 mg by mouth every 6 (six) hours as needed for moderate pain (pain).     [provider]  alendronate (FOSAMAX) 70 MG tablet Take 70 mg by mouth once a week. On Fridays. Take with a full glass of water on an empty stomach.    [provider]  allopurinol (ZYLOPRIM) 300 MG tablet Take 300 mg by mouth daily.    [provider]  colchicine 0.6 MG tablet Take 0.6 mg by mouth as needed (for gout flare).     [provider]  diphenhydramine-acetaminophen (TYLENOL PM) 25-500 MG TABS Take 2 tablets by mouth at bedtime as needed (sleep).     [provider]  esomeprazole (NEXIUM) 40 MG capsule Take 40  mg by mouth daily.  10/01/14   [provider]  gabapentin (NEURONTIN) 100 MG capsule Take 1 capsule (100 mg total) by mouth 3 (three) times daily. 01/29/20   Tasia Catchings, Amy V, PA-C  losartan (COZAAR) 100 MG tablet Take 100 mg by mouth daily.    [provider]  polycarbophil (FIBERCON) 625 MG tablet Take 625 mg by mouth daily.    [provider]  PROAIR HFA 108 (90 BASE) MCG/ACT inhaler Inhale 2 puffs into the lungs as needed.  02/04/15   [provider]  promethazine (PHENERGAN) 12.5 MG tablet Take 12.5 mg by mouth every 6 (six) hours as needed. 06/18/19   [provider]  simvastatin (ZOCOR) 40 MG tablet Take 40 mg by mouth every evening.    [provider]  solifenacin (VESICARE) 10 MG tablet Take 10 mg by mouth daily.    [provider]  traMADol (ULTRAM) 50 MG tablet Take 50 mg by mouth every 6 (six) hours as needed for moderate pain or severe pain (pain).    [provider]    Allergies    Hydromorphone hcl, Nsaids, and Prednisone  Review of Systems   Review of Systems  Constitutional: Positive for appetite change and fatigue. Negative for fever.  HENT: Negative for congestion and sore throat.   Respiratory: Positive for cough. Negative for shortness of breath.   Cardiovascular: Negative for chest pain.  Gastrointestinal: Negative for abdominal pain, diarrhea, nausea and vomiting.  Neurological: Negative for syncope, light-headedness (at first but resolved) and headaches.    Physical Exam Updated Vital Signs BP (!) 177/115 (BP Location: Right Arm)   Pulse 95 Comment: After walking  Temp 97.9 F (36.6 C) (Oral)   Resp 18   Ht 5\' 3"  (1.6 m)   Wt 77.1 kg   SpO2 100%   BMI 30.11 kg/m   Physical Exam Vitals and nursing note reviewed.  Constitutional:      General: She is not in acute distress.    Appearance: She is well-developed. She is not diaphoretic.  HENT:     Head: Normocephalic and atraumatic.  Eyes:      Conjunctiva/sclera: Conjunctivae normal.  Cardiovascular:     Rate and Rhythm: Normal rate and regular rhythm.     Heart sounds: Normal heart sounds. No murmur heard. No friction rub. No gallop.   Pulmonary:     Effort: Pulmonary effort is normal. No respiratory distress.     Breath sounds:  Normal breath sounds. No wheezing or rales.  Abdominal:     General: There is no distension.     Palpations: Abdomen is soft.     Tenderness: There is no abdominal tenderness. There is no guarding.  Musculoskeletal:        General: No tenderness.     Cervical back: Normal range of motion.  Skin:    General: Skin is warm and dry.     Findings: No erythema or rash.  Neurological:     Mental Status: She is alert and oriented to person, place, and time.     ED Results / Procedures / Treatments   Labs (all labs ordered are listed, but only abnormal results are displayed) Labs Reviewed - No data to display  EKG None  Radiology DG Chest Portable 1 View  Result Date: 09/21/2020 CLINICAL DATA:  Shortness of breath.  COVID positive. EXAM: PORTABLE CHEST 1 VIEW COMPARISON:  01/14/2014 FINDINGS: The cardiomediastinal contours are normal. Aortic atherosclerosis. Minor bibasilar atelectasis. Pulmonary vasculature is normal. No consolidation, pleural effusion, or pneumothorax. Thoracic spondylosis with spurring. No acute osseous abnormalities are seen. IMPRESSION: Minor bibasilar atelectasis. No focal airspace disease. Electronically Signed   By: Keith Rake M.D.   On: 09/21/2020 16:21    Procedures Procedures   Medications Ordered in ED Medications - No data to display  ED Course  I have reviewed the triage vital signs and the nursing notes.  Pertinent labs & imaging results that were available during my care of the patient were reviewed by me and considered in my medical decision making (see chart for details).    MDM Rules/Calculators/A&P                          87yo female with  history above presents with concern for persistent cough in setting of COVID 19 diagnosis. CXR without acute findings. Normal oxygen saturation, no dyspnea or respiratory distress.  PCP had sent for possible labwork however they had difficulty drawing blood and after discussion with patient and evaluation feel it is reasonable to forego labwork. Reports that despite her low appetite, she has been taking in po, and given no hypotension, no tachycardia feel it is reasonable for her to follow up as an outpatient. Recommend continued supportive care.   Final Clinical Impression(s) / ED Diagnoses Final diagnoses:  COVID-19    Rx / DC Orders ED Discharge Orders    None       Gareth Morgan, MD 09/23/20 1153

## 2020-09-21 NOTE — ED Triage Notes (Addendum)
Pt tested positive for COVID on 5/16. Pt symptoms started on 5/13. Pt here today due to persistent cough and ShOB with exertion. Pt has not had any COVID vaccinations.

## 2020-10-03 DIAGNOSIS — I10 Essential (primary) hypertension: Secondary | ICD-10-CM | POA: Diagnosis not present

## 2020-10-03 DIAGNOSIS — Z0000 Encounter for general adult medical examination without abnormal findings: Secondary | ICD-10-CM | POA: Diagnosis not present

## 2020-10-03 DIAGNOSIS — J4520 Mild intermittent asthma, uncomplicated: Secondary | ICD-10-CM | POA: Diagnosis not present

## 2020-10-03 DIAGNOSIS — R110 Nausea: Secondary | ICD-10-CM | POA: Diagnosis not present

## 2020-10-03 DIAGNOSIS — E7800 Pure hypercholesterolemia, unspecified: Secondary | ICD-10-CM | POA: Diagnosis not present

## 2020-10-03 DIAGNOSIS — N3281 Overactive bladder: Secondary | ICD-10-CM | POA: Diagnosis not present

## 2020-10-03 DIAGNOSIS — N183 Chronic kidney disease, stage 3 unspecified: Secondary | ICD-10-CM | POA: Diagnosis not present

## 2020-10-03 DIAGNOSIS — M179 Osteoarthritis of knee, unspecified: Secondary | ICD-10-CM | POA: Diagnosis not present

## 2020-10-03 DIAGNOSIS — K219 Gastro-esophageal reflux disease without esophagitis: Secondary | ICD-10-CM | POA: Diagnosis not present

## 2020-10-03 DIAGNOSIS — M109 Gout, unspecified: Secondary | ICD-10-CM | POA: Diagnosis not present

## 2020-12-09 DIAGNOSIS — H6123 Impacted cerumen, bilateral: Secondary | ICD-10-CM | POA: Diagnosis not present

## 2020-12-09 DIAGNOSIS — H9041 Sensorineural hearing loss, unilateral, right ear, with unrestricted hearing on the contralateral side: Secondary | ICD-10-CM | POA: Diagnosis not present

## 2020-12-09 DIAGNOSIS — Z974 Presence of external hearing-aid: Secondary | ICD-10-CM | POA: Diagnosis not present

## 2020-12-19 DIAGNOSIS — I10 Essential (primary) hypertension: Secondary | ICD-10-CM | POA: Diagnosis not present

## 2020-12-19 DIAGNOSIS — M179 Osteoarthritis of knee, unspecified: Secondary | ICD-10-CM | POA: Diagnosis not present

## 2020-12-19 DIAGNOSIS — J4520 Mild intermittent asthma, uncomplicated: Secondary | ICD-10-CM | POA: Diagnosis not present

## 2020-12-19 DIAGNOSIS — B351 Tinea unguium: Secondary | ICD-10-CM | POA: Diagnosis not present

## 2020-12-19 DIAGNOSIS — R2689 Other abnormalities of gait and mobility: Secondary | ICD-10-CM | POA: Diagnosis not present

## 2020-12-24 DIAGNOSIS — H905 Unspecified sensorineural hearing loss: Secondary | ICD-10-CM | POA: Diagnosis not present

## 2020-12-24 DIAGNOSIS — M179 Osteoarthritis of knee, unspecified: Secondary | ICD-10-CM | POA: Diagnosis not present

## 2021-01-08 DIAGNOSIS — N3000 Acute cystitis without hematuria: Secondary | ICD-10-CM | POA: Diagnosis not present

## 2021-01-31 DIAGNOSIS — M179 Osteoarthritis of knee, unspecified: Secondary | ICD-10-CM | POA: Diagnosis not present

## 2021-03-26 DIAGNOSIS — M179 Osteoarthritis of knee, unspecified: Secondary | ICD-10-CM | POA: Diagnosis not present

## 2021-04-21 ENCOUNTER — Inpatient Hospital Stay: Payer: Medicare Other | Attending: Hematology

## 2021-04-21 ENCOUNTER — Encounter: Payer: Self-pay | Admitting: Hematology

## 2021-04-21 ENCOUNTER — Other Ambulatory Visit: Payer: Self-pay

## 2021-04-21 ENCOUNTER — Inpatient Hospital Stay (HOSPITAL_BASED_OUTPATIENT_CLINIC_OR_DEPARTMENT_OTHER): Payer: Medicare Other | Admitting: Hematology

## 2021-04-21 DIAGNOSIS — D649 Anemia, unspecified: Secondary | ICD-10-CM

## 2021-04-21 DIAGNOSIS — Z886 Allergy status to analgesic agent status: Secondary | ICD-10-CM | POA: Diagnosis not present

## 2021-04-21 DIAGNOSIS — Z853 Personal history of malignant neoplasm of breast: Secondary | ICD-10-CM | POA: Diagnosis not present

## 2021-04-21 DIAGNOSIS — M810 Age-related osteoporosis without current pathological fracture: Secondary | ICD-10-CM | POA: Insufficient documentation

## 2021-04-21 DIAGNOSIS — C50512 Malignant neoplasm of lower-outer quadrant of left female breast: Secondary | ICD-10-CM

## 2021-04-21 DIAGNOSIS — I10 Essential (primary) hypertension: Secondary | ICD-10-CM | POA: Diagnosis not present

## 2021-04-21 DIAGNOSIS — I700 Atherosclerosis of aorta: Secondary | ICD-10-CM | POA: Insufficient documentation

## 2021-04-21 DIAGNOSIS — M1990 Unspecified osteoarthritis, unspecified site: Secondary | ICD-10-CM | POA: Diagnosis not present

## 2021-04-21 DIAGNOSIS — Z79899 Other long term (current) drug therapy: Secondary | ICD-10-CM | POA: Insufficient documentation

## 2021-04-21 DIAGNOSIS — Z170 Estrogen receptor positive status [ER+]: Secondary | ICD-10-CM | POA: Diagnosis not present

## 2021-04-21 DIAGNOSIS — Z9049 Acquired absence of other specified parts of digestive tract: Secondary | ICD-10-CM | POA: Diagnosis not present

## 2021-04-21 DIAGNOSIS — Z888 Allergy status to other drugs, medicaments and biological substances status: Secondary | ICD-10-CM | POA: Insufficient documentation

## 2021-04-21 LAB — CBC WITH DIFFERENTIAL (CANCER CENTER ONLY)
Abs Immature Granulocytes: 0.04 10*3/uL (ref 0.00–0.07)
Basophils Absolute: 0.1 10*3/uL (ref 0.0–0.1)
Basophils Relative: 1 %
Eosinophils Absolute: 0.2 10*3/uL (ref 0.0–0.5)
Eosinophils Relative: 3 %
HCT: 35.1 % — ABNORMAL LOW (ref 36.0–46.0)
Hemoglobin: 11.4 g/dL — ABNORMAL LOW (ref 12.0–15.0)
Immature Granulocytes: 0 %
Lymphocytes Relative: 19 %
Lymphs Abs: 1.8 10*3/uL (ref 0.7–4.0)
MCH: 30.3 pg (ref 26.0–34.0)
MCHC: 32.5 g/dL (ref 30.0–36.0)
MCV: 93.4 fL (ref 80.0–100.0)
Monocytes Absolute: 0.7 10*3/uL (ref 0.1–1.0)
Monocytes Relative: 7 %
Neutro Abs: 6.6 10*3/uL (ref 1.7–7.7)
Neutrophils Relative %: 70 %
Platelet Count: 262 10*3/uL (ref 150–400)
RBC: 3.76 MIL/uL — ABNORMAL LOW (ref 3.87–5.11)
RDW: 14.5 % (ref 11.5–15.5)
WBC Count: 9.4 10*3/uL (ref 4.0–10.5)
nRBC: 0 % (ref 0.0–0.2)

## 2021-04-21 LAB — CMP (CANCER CENTER ONLY)
ALT: 8 U/L (ref 0–44)
AST: 14 U/L — ABNORMAL LOW (ref 15–41)
Albumin: 4.2 g/dL (ref 3.5–5.0)
Alkaline Phosphatase: 71 U/L (ref 38–126)
Anion gap: 9 (ref 5–15)
BUN: 18 mg/dL (ref 8–23)
CO2: 24 mmol/L (ref 22–32)
Calcium: 9.7 mg/dL (ref 8.9–10.3)
Chloride: 108 mmol/L (ref 98–111)
Creatinine: 1.36 mg/dL — ABNORMAL HIGH (ref 0.44–1.00)
GFR, Estimated: 38 mL/min — ABNORMAL LOW (ref >60)
Glucose, Bld: 135 mg/dL — ABNORMAL HIGH (ref 70–99)
Potassium: 3.9 mmol/L (ref 3.5–5.1)
Sodium: 141 mmol/L (ref 135–145)
Total Bilirubin: 0.4 mg/dL (ref 0.3–1.2)
Total Protein: 6.9 g/dL (ref 6.5–8.1)

## 2021-04-21 NOTE — Progress Notes (Signed)
[place]   [phone] [phone]   Clinic Follow up Note   Patient Care Team: [name], MD as PCP - General (Family Medicine) Neldon Mc, MD as Surgeon (General Surgery) Nira Retort, MD as Consulting Physician (Hematology and Oncology)  Date of Service:  04/21/2021  CHIEF COMPLAINT: f/u of left breast cancer  CURRENT THERAPY:  Surveillance  ASSESSMENT & PLAN:  Melissa Morton is a 87 y.o. female with   1. Breast cancer of lower-outer quadrant of left female breast, pT1cN0M0, stage I, ER+/PR+, HER2- -She was diagnosed in 12/2011. She is s/p left breast lumpectomy, adjuvant radiation and 5 years of anti-estrogen therapy with Anastrozole.  -most recent mammogram 03/2020 was negative.  -Melissa Morton is clinically doing well. CBC normal except Hg 11.4. CMP overall stable. Breast exam is benign. No clinical concern for recurrence.   -she missed her mammogram appointment at [facility] this past weekend. She plans to reschedule. -Continue breast cancer surveillance, self exam, and mammogram.  -she is 9 years out from diagnosis, will see her back in 1 year for last visit    2. Mild Anemia -hgb has been slightly low the last two years, 11.4 today (04/21/21) -she endorses taking a multivitamin, and I advised them to check the amount of iron in it. I suggested switching to prenatal vitamin to increase iron.  -we will recheck in 1 year with iron panel.   3. Osteoporosis  -Osteopenia in 2015, and 04/2016 DEXA showed osteoporosis of Left distal radius, T score -2.5. -DEXA in 07/2018 mostly stable, Continue calcium, vitamin D and Fosamax which she is tolerating well. -repeat 07/2020 per PCP   4. Hypertension, arthritis  -on losartan, followed by PCP   5. Skin cancer of anterior lower left leg, tip of nose -F/u dermatology     PLAN: -mammogram at [facility] to be rescheduled soon  -f/u with her PCP in March 2023 including CBC and iron  study  -lab and f/u in 1 year   No problem-specific Assessment & Plan notes found for this encounter.   SUMMARY OF ONCOLOGIC HISTORY: Oncology History Overview Note  Breast cancer, left breast   Staging form: Breast, AJCC 7th Edition     Clinical stage from 12/07/2011: Stage IA (T1, N0, cM0) - Signed by Haywood Lasso, MD on 12/07/2011       Prognostic indicators: ER = 100%  PR = 100%  Ki67 = 12%  Her 2 equivocal on BX, 1.26 (no amp) on lumpectomy      Pathologic: Stage IA (T1c, N0, cM0) - Signed by Haywood Lasso, MD on 01/24/2012       Prognostic indicators: ER = 100%  PR = 100%  Ki67 = 12%  Her 2 equivocal on BX, 1.26 (no amp) on lumpectomy      Breast cancer of lower-outer quadrant of left female breast ([place])  12/07/2011 Initial Diagnosis   Breast cancer, left breast   01/02/2012 Surgery   lumpectomy    03/14/2012 - 05/02/2012 Radiation Therapy   35 radiation treatments under the direction of Dr. Sondra Come that was completed on     05/06/2012 - 04/2017 Anti-estrogen oral therapy   Arimidex 1 mg daily   03/16/2016 Mammogram   IMPRESSION: Stable left breast lumpectomy site. No mammographic evidence of malignancy in the bilateral breasts.   09/07/2016 Imaging   US Abdomen Complete IMPRESSION: Post cholecystectomy.  Common bile duct and liver unremarkable.   Slightly prominent pancreatic duct unchanged from  2013. No pancreatic mass identified (tail not visualized secondary to bowel gas).   Mild bilateral renal parenchymal thinning without hydronephrosis.   Aortic atherosclerosis.      INTERVAL HISTORY:  Melissa Morton is here for a follow up of breast cancer. She was last seen by NP Lacie on 06/25/19. She presents to the clinic accompanied by her daughter. She reports her energy is good overall, she just moves slowly. She reports she has fallen 5 times this year. She notes she had not fallen for "a long time" before this year.   All other systems were reviewed  with the patient and are negative.  MEDICAL HISTORY:  Past Medical History:  Diagnosis Date   Allergy    seasonal allergies   Arthritis    knee and hands   Breast cancer, left breast ([place]) 12/07/2011   Breast cancer, left breast ([place]) 11/24/11   left breast 4 o'clock bx=invasive ductal ca,grade II,ER/PR=+   Diverticulitis    Full dentures    GERD (gastroesophageal reflux disease)    HOH (hard of hearing)    right ear    Hx of radiation therapy 03/14/12- 05/02/12   left breast 5040 gray in 28 fx, site of presentation lower outer quadrant boosted to 6440 gray   Hyperlipemia    Hypertension    Left knee DJD    PONV (postoperative nausea and vomiting)    has used scop patch-that helped   Reflux    Renal insufficiency 11/09/2011   Shortness of breath    Wears glasses     SURGICAL HISTORY: Past Surgical History:  Procedure Laterality Date   ABDOMINAL HYSTERECTOMY  89   total   BLADDER SUSPENSION     BREAST LUMPECTOMY  01/04/2012   left,invasive ductcl ca, grade II,lloq, lymphovascular and perineural invaion ,DCIS,   CHOLECYSTECTOMY     EYE SURGERY     both cataracts   FRACTURE SURGERY     lt foot   KNEE SURGERY  2000   rt    TONSILLECTOMY     TOTAL KNEE ARTHROPLASTY Left 12/24/2013   Procedure: LEFT TOTAL KNEE ARTHROPLASTY;  Surgeon: Lorn Junes, MD;  Location: [facility];  Service: Orthopedics;  Laterality: Left;   TUBAL LIGATION      I have reviewed the social history and family history with the patient and they are unchanged from previous note.  ALLERGIES:  is allergic to hydromorphone hcl, nsaids, and prednisone.  MEDICATIONS:  Current Outpatient Medications  Medication Sig Dispense Refill   acetaminophen (TYLENOL) 325 MG tablet Take 650 mg by mouth every 6 (six) hours as needed for moderate pain (pain).      alendronate (FOSAMAX) 70 MG tablet Take 70 mg by mouth once a week. On Fridays. Take with a full glass of water on an empty stomach.     allopurinol (ZYLOPRIM)  300 MG tablet Take 300 mg by mouth daily.     colchicine 0.6 MG tablet Take 0.6 mg by mouth as needed (for gout flare).      diphenhydramine-acetaminophen (TYLENOL PM) 25-500 MG TABS Take 2 tablets by mouth at bedtime as needed (sleep).      esomeprazole (NEXIUM) 40 MG capsule Take 40 mg by mouth daily.      gabapentin (NEURONTIN) 100 MG capsule Take 1 capsule (100 mg total) by mouth 3 (three) times daily. 20 capsule 1   losartan (COZAAR) 100 MG tablet Take 100 mg by mouth daily.     polycarbophil (FIBERCON)  625 MG tablet Take 625 mg by mouth daily.     PROAIR HFA 108 (90 BASE) MCG/ACT inhaler Inhale 2 puffs into the lungs as needed.      promethazine (PHENERGAN) 12.5 MG tablet Take 12.5 mg by mouth every 6 (six) hours as needed.     simvastatin (ZOCOR) 40 MG tablet Take 40 mg by mouth every evening.     solifenacin (VESICARE) 10 MG tablet Take 10 mg by mouth daily.     traMADol (ULTRAM) 50 MG tablet Take 50 mg by mouth every 6 (six) hours as needed for moderate pain or severe pain (pain).     No current facility-administered medications for this visit.    PHYSICAL EXAMINATION: ECOG PERFORMANCE STATUS: 1 - Symptomatic but completely ambulatory  There were no vitals filed for this visit. Wt Readings from Last 3 Encounters:  09/21/20 170 lb (77.1 kg)  06/25/19 174 lb 1.6 oz (79 kg)  06/23/18 171 lb 3.2 oz (77.7 kg)     GENERAL:alert, no distress and comfortable SKIN: skin color, texture, turgor are normal, no rashes or significant lesions EYES: normal, Conjunctiva are pink and non-injected, sclera clear  NECK: supple, thyroid normal size, non-tender, without nodularity LYMPH:  no palpable lymphadenopathy in the cervical, axillary  LUNGS: clear to auscultation and percussion with normal breathing effort HEART: regular rate & rhythm and no murmurs, (+) mild lower extremity edema ABDOMEN:abdomen soft, non-tender and normal bowel sounds Musculoskeletal:no cyanosis of digits and no  clubbing  NEURO: alert & oriented x 3 with fluent speech, no focal motor/sensory deficits BREAST: No palpable mass, nodules or adenopathy bilaterally. Breast exam benign.   LABORATORY DATA:  I have reviewed the data as listed CBC Latest Ref Rng & Units 04/21/2021 06/25/2019 06/23/2018  WBC 4.0 - 10.5 K/uL 9.4 7.7 6.2  Hemoglobin 12.0 - 15.0 g/dL 11.4(L) 11.9(L) 12.5  Hematocrit 36.0 - 46.0 % 35.1(L) 37.4 39.6  Platelets 150 - 400 K/uL 262 227 243     CMP Latest Ref Rng & Units 04/21/2021 06/25/2019 06/23/2018  Glucose 70 - 99 mg/dL 135(H) 80 98  BUN 8 - 23 mg/dL _0 Creatinine 0.44 - 1.00 mg/dL 1.36(H) 1.17(H) 1.31(H)  Sodium 135 - 145 mmol/L 141 145 143  Potassium 3.5 - 5.1 mmol/L 3.9 4.2 4.3  Chloride 98 - 111 mmol/L 108 111 108  CO2 22 - 32 mmol/L _1 Calcium 8.9 - 10.3 mg/dL 9.7 8.9 9.3  Total Protein 6.5 - 8.1 g/dL 6.9 6.8 6.5  Total Bilirubin 0.3 - 1.2 mg/dL 0.4 0.4 0.5  Alkaline Phos 38 - 126 U/L 71 105 98  AST 15 - 41 U/L 14(L) 14(L) 18  ALT 0 - 44 U/L _2 RADIOGRAPHIC STUDIES: I have personally reviewed the radiological images as listed and agreed with the findings in the report. No results found.    No orders of the defined types were placed in this encounter.  All questions were answered. The patient knows to call the clinic with any problems, questions or concerns. No barriers to learning was detected. The total time spent in the appointment was 25 minutes.     Truitt Merle, MD 04/21/2021   I, Wilburn Mylar, am acting as scribe for Truitt Merle, MD.   I have reviewed the above documentation for accuracy and completeness, and I agree with the above.

## 2021-04-25 DIAGNOSIS — M179 Osteoarthritis of knee, unspecified: Secondary | ICD-10-CM | POA: Diagnosis not present

## 2021-05-01 DIAGNOSIS — Z1231 Encounter for screening mammogram for malignant neoplasm of breast: Secondary | ICD-10-CM | POA: Diagnosis not present

## 2021-05-26 DIAGNOSIS — M179 Osteoarthritis of knee, unspecified: Secondary | ICD-10-CM | POA: Diagnosis not present

## 2021-05-29 ENCOUNTER — Emergency Department (HOSPITAL_COMMUNITY): Payer: Medicare Other

## 2021-05-29 ENCOUNTER — Emergency Department (HOSPITAL_COMMUNITY)
Admission: EM | Admit: 2021-05-29 | Discharge: 2021-05-29 | Disposition: A | Discharge status: Home / Self Care | Payer: Medicare Other | Attending: Emergency Medicine | Admitting: Emergency Medicine

## 2021-05-29 ENCOUNTER — Other Ambulatory Visit: Payer: Self-pay

## 2021-05-29 DIAGNOSIS — W19XXXA Unspecified fall, initial encounter: Secondary | ICD-10-CM | POA: Diagnosis not present

## 2021-05-29 DIAGNOSIS — M25571 Pain in right ankle and joints of right foot: Secondary | ICD-10-CM | POA: Diagnosis not present

## 2021-05-29 DIAGNOSIS — Z743 Need for continuous supervision: Secondary | ICD-10-CM | POA: Diagnosis not present

## 2021-05-29 DIAGNOSIS — M25552 Pain in left hip: Secondary | ICD-10-CM | POA: Insufficient documentation

## 2021-05-29 DIAGNOSIS — S0990XA Unspecified injury of head, initial encounter: Secondary | ICD-10-CM | POA: Diagnosis not present

## 2021-05-29 DIAGNOSIS — R079 Chest pain, unspecified: Secondary | ICD-10-CM | POA: Diagnosis not present

## 2021-05-29 DIAGNOSIS — I10 Essential (primary) hypertension: Secondary | ICD-10-CM | POA: Insufficient documentation

## 2021-05-29 DIAGNOSIS — W01198A Fall on same level from slipping, tripping and stumbling with subsequent striking against other object, initial encounter: Secondary | ICD-10-CM | POA: Diagnosis not present

## 2021-05-29 DIAGNOSIS — R6889 Other general symptoms and signs: Secondary | ICD-10-CM | POA: Diagnosis not present

## 2021-05-29 DIAGNOSIS — R110 Nausea: Secondary | ICD-10-CM | POA: Diagnosis not present

## 2021-05-29 DIAGNOSIS — S0003XA Contusion of scalp, initial encounter: Secondary | ICD-10-CM | POA: Diagnosis not present

## 2021-05-29 DIAGNOSIS — R030 Elevated blood-pressure reading, without diagnosis of hypertension: Secondary | ICD-10-CM

## 2021-05-29 DIAGNOSIS — Z043 Encounter for examination and observation following other accident: Secondary | ICD-10-CM | POA: Diagnosis not present

## 2021-05-29 DIAGNOSIS — M2578 Osteophyte, vertebrae: Secondary | ICD-10-CM | POA: Diagnosis not present

## 2021-05-29 DIAGNOSIS — I517 Cardiomegaly: Secondary | ICD-10-CM | POA: Diagnosis not present

## 2021-05-29 MED ORDER — LOSARTAN POTASSIUM 50 MG PO TABS
50.0000 mg | ORAL_TABLET | Freq: Every day | ORAL | Status: DC
  Administered 2021-05-29: 50 mg via ORAL
  Filled 2021-05-29: qty 1

## 2021-05-29 MED ORDER — FENTANYL CITRATE PF 50 MCG/ML IJ SOSY
25.0000 ug | PREFILLED_SYRINGE | Freq: Once | INTRAMUSCULAR | Status: AC
  Administered 2021-05-29: 25 ug via INTRAVENOUS
  Filled 2021-05-29: qty 1

## 2021-05-29 NOTE — Discharge Instructions (Addendum)
At this time there does not appear to be the presence of an emergent medical condition, however there is always the potential for conditions to change. Please read and follow the below instructions.  Please return to the Emergency Department immediately for any new or worsening symptoms. Please be sure to follow up with your Primary Care Provider within one week regarding your visit today; please call their office to schedule an appointment even if you are feeling better for a follow-up visit. Please have your blood pressure rechecked by your primary care provider in the next few days.  Please be sure to take all of your home blood pressure medications as prescribed. Please use the boot and a walker and help keep the weight off of your right ankle until you are cleared to walk on again by the orthopedic specialist.  He may follow-up with the on-call orthopedic specialist Dr. [facility]inf[facility] for further evaluation and treatment.  Please use rest ice and elevation to help with your symptoms. Please continue good wound care on your head, wash gently with soapy water and apply small amount of antibiotic ointment for the next few days. Your CT scan showed microvascular ischemic changes of your brain.  He also showed degenerative changes of your spine and aortic atherosclerosis and a thyroid nodule.  Please discuss these findings with your primary care provider at your follow-up visit.  Additionally there was generative changes seen at your foot and of your hip please discuss these with the orthopedic specialist at your follow-up visit. Your chest x-ray also showed some possible scarring of your lung.  Please have your primary care provider repeat your chest x-ray to further evaluate this area.  If you develop any signs of pneumonia such as cough fever or trouble breathing please return immediately to the ER.   Go to the nearest Emergency Department immediately if: You have fever or chills Get a very  bad headache. Start to feel mixed up (confused). Feel weak or numb. Feel faint. Have very bad pain in your: Chest. Belly (abdomen). Throw up more than once. Have trouble breathing. You have: A very bad headache that is not helped by medicine. Trouble walking or weakness in your arms and legs. Clear or bloody fluid coming from your nose or ears. Changes in how you see (vision). A seizure. More confusion or more grumpy moods. Your symptoms get worse. You are sleepier than normal and have trouble staying awake. You lose your balance. The black centers of your eyes (pupils) change in size. Your speech is slurred. Your dizziness gets worse. You vomit. You have any new/concerning or worsening of symptoms.  Please read the additional information packets attached to your discharge summary.  Do not take your medicine if  develop an itchy rash, swelling in your mouth or lips, or difficulty breathing; call 911 and seek immediate emergency medical attention if this occurs.  You may review your lab tests and imaging results in their entirety on your MyChart account.  Please discuss all results of fully with your primary care provider and other specialist at your follow-up visit.  Note: Portions of this text may have been transcribed using voice recognition software. Every effort was made to ensure accuracy; however, inadvertent computerized transcription errors may still be present.

## 2021-05-29 NOTE — ED Provider Notes (Addendum)
[facility] EMERGENCY DEPARTMENT Provider Note   CSN: [id number] Arrival date & time: 05/29/21  1039     History  Chief Complaint  Patient presents with   Melissa Morton    Melissa Morton is a 88 y.o. female history significant for hypertension.  Patient presented after trip and fall around 1 hour prior to arrival, patient was putting on her jacket when she tripped over her purse which was on the ground.  Patient fell forward striking her left forehead on the ground, she reports pain of the area as aching moderate intensity constant worsened with palpation improves with rest, pain does not radiate.  Patient reports some dizziness and nausea since her fall which is gradually improving.  Additionally patient reports pain at the left hip which is mild and aching, patient is unsure if she landed on her left hip.  She also describes right ankle pain where she kicked some furniture while falling, pain is aching and mild as well.  Of note patient did not take her blood pressure medication this morning, losartan.  Patient denies blood thinner use, loss of consciousness, vomiting, neck pain, back pain, chest pain, abdominal pain, pelvic pain, pain to the upper extremities, numbness/tingling or any additional concerns  HPI     Home Medications Prior to Admission medications   Medication Sig Start Date End Date Taking? Authorizing Provider  acetaminophen (TYLENOL) 325 MG tablet Take 650 mg by mouth every 6 (six) hours as needed for moderate pain (pain).     [provider]  alendronate (FOSAMAX) 70 MG tablet Take 70 mg by mouth once a week. On Fridays. Take with a full glass of water on an empty stomach.    [provider]  allopurinol (ZYLOPRIM) 300 MG tablet Take 300 mg by mouth daily.    [provider]  colchicine 0.6 MG tablet Take 0.6 mg by mouth as needed (for gout flare).     [provider]  diphenhydramine-acetaminophen (TYLENOL PM) 25-500 MG  TABS Take 2 tablets by mouth at bedtime as needed (sleep).     [provider]  esomeprazole (NEXIUM) 40 MG capsule Take 40 mg by mouth daily.  10/01/14   [provider]  gabapentin (NEURONTIN) 100 MG capsule Take 1 capsule (100 mg total) by mouth 3 (three) times daily. 01/29/20   Tasia Catchings, Amy V, PA-C  losartan (COZAAR) 100 MG tablet Take 100 mg by mouth daily.    [provider]  polycarbophil (FIBERCON) 625 MG tablet Take 625 mg by mouth daily.    [provider]  PROAIR HFA 108 (90 BASE) MCG/ACT inhaler Inhale 2 puffs into the lungs as needed.  02/04/15   [provider]  promethazine (PHENERGAN) 12.5 MG tablet Take 12.5 mg by mouth every 6 (six) hours as needed. 06/18/19   [provider]  simvastatin (ZOCOR) 40 MG tablet Take 40 mg by mouth every evening.    [provider]  solifenacin (VESICARE) 10 MG tablet Take 10 mg by mouth daily.    [provider]  traMADol (ULTRAM) 50 MG tablet Take 50 mg by mouth every 6 (six) hours as needed for moderate pain or severe pain (pain).    [provider]      Allergies    Hydromorphone hcl, Nsaids, and Prednisone    Review of Systems   Review of Systems Ten systems are reviewed and are negative for acute change except as noted in the HPI  Physical Exam  Updated Vital Signs BP (!) 214/101    Pulse 83    Temp 97.8 F (36.6 C) (Oral)    Resp 17    Ht 5\' 3"  (1.6 m)    Wt 83.9 kg    SpO2 98%    BMI 32.77 kg/m  Physical Exam Constitutional:      General: She is not in acute distress.    Appearance: Normal appearance. She is well-developed. She is not ill-appearing or diaphoretic.  HENT:     Head: Normocephalic. Abrasion and contusion present. No raccoon eyes or Battle's sign.     Jaw: There is normal jaw occlusion. No trismus.      Comments: Small hematoma with superficial contusion of the left frontal scalp.  No repairable defects.  Midface stable without pain.    Right  Ear: External ear normal. No hemotympanum.     Left Ear: External ear normal. No hemotympanum.     Nose: Nose normal.     Mouth/Throat:     Mouth: Mucous membranes are moist.     Pharynx: Oropharynx is clear. Uvula midline.  Eyes:     General: Vision grossly intact. Gaze aligned appropriately.     Extraocular Movements: Extraocular movements intact.     Conjunctiva/sclera: Conjunctivae normal.     Pupils: Pupils are equal, round, and reactive to light.     Comments: No pain with EOM.  No entrapment.  Neck:     Trachea: Trachea and phonation normal.  Cardiovascular:     Rate and Rhythm: Normal rate and regular rhythm.     Pulses:          Dorsalis pedis pulses are 1+ on the right side and 1+ on the left side.  Pulmonary:     Effort: Pulmonary effort is normal. No respiratory distress.     Breath sounds: Normal breath sounds and air entry.  Chest:     Chest wall: No deformity, tenderness or crepitus.  Abdominal:     General: There is no distension.     Palpations: Abdomen is soft.     Tenderness: There is no abdominal tenderness. There is no guarding or rebound.  Musculoskeletal:        General: Normal range of motion.     Cervical back: Normal range of motion and neck supple. No spinous process tenderness or muscular tenderness.     Comments: No midline spinal tenderness palpation.  No crepitus step-off or deformity to spine.  No paraspinal muscular tenderness palpation.  Pelvis is stable to compression without pain.  Patient is able to pull to a seated position without pain or difficulty.  Full range of motion of all major joints of the bilateral upper extremities without pain.  Right ankle: Mild ecchymosis and swelling at the medial malleolus.  No skin break.  Mild TTP overlying the medial malleolus.  No TTP of the remainder of the ankle or foot.  ROM and strength intact with all movement of the right ankle.  Capillary refill and sensation intact to all toes.  Strong equal pedal  pulses.  Compartments soft.  No pain with motion at the right hip, right knee or right toes.  Left hip: Mild TTP overlying the greater trochanter.  Full ROM and strength of the left hip.  Capillary refill and sensation intact distally.  Strong equal pedal pulses.  Compartments soft.  No pain with motion at the left knee, left ankle or left foot/toes.  Feet:     Right foot:  Protective Sensation: 3 sites tested.  3 sites sensed.     Left foot:     Protective Sensation: 3 sites tested.  3 sites sensed.  Skin:    General: Skin is warm and dry.  Neurological:     Mental Status: She is alert.     GCS: GCS eye subscore is 4. GCS verbal subscore is 5. GCS motor subscore is 6.     Comments: Speech is clear and goal oriented, follows commands Major Cranial nerves without deficit, no facial droop Moves extremities without ataxia, coordination intact  Psychiatric:        Behavior: Behavior normal.    ED Results / Procedures / Treatments   Labs (all labs ordered are listed, but only abnormal results are displayed) Labs Reviewed - No data to display  EKG None  Radiology DG Chest 2 View  Result Date: 05/29/2021 CLINICAL DATA:  Fall.  Pain. EXAM: CHEST - 2 VIEW COMPARISON:  09/21/2020 FINDINGS: AP and lateral views. The lateral view is degraded by patient arm position. Low lung volume portable AP view. Midline trachea. Borderline cardiomegaly. Possible small left pleural effusion. Low lung volumes with resultant pulmonary interstitial prominence. Clear right lung. Vague left lower lobe opacity. IMPRESSION: AP and lateral views with low lung volumes on the AP portable radiograph. Vague increased density is likely due to volume loss in the setting of known left lower lobe scarring. If concern of pneumonia, consider repeat PA radiograph. No acute posttraumatic deformity otherwise identified. Electronically Signed   By: Abigail Miyamoto M.D.   On: 05/29/2021 11:25   DG Ankle Complete Right  Result  Date: 05/29/2021 CLINICAL DATA:  Fall today.  Right ankle pain. EXAM: RIGHT ANKLE - COMPLETE 3+ VIEW COMPARISON:  None. FINDINGS: The bones appear mildly demineralized. There is no evidence of acute fracture or dislocation. There is possible posttraumatic deformity of the distal tibia, best seen on the lateral view. There are advanced midfoot degenerative changes and prominent calcaneal spurs. No focal soft tissue abnormalities are identified. IMPRESSION: No evidence of acute fracture or dislocation. Advanced midfoot degenerative changes. Electronically Signed   By: Richardean Sale M.D.   On: 05/29/2021 11:26   CT HEAD WO CONTRAST (5MM)  Result Date: 05/29/2021 CLINICAL DATA:  Witnessed fall, left frontal gray shin EXAM: CT HEAD WITHOUT CONTRAST CT CERVICAL SPINE WITHOUT CONTRAST TECHNIQUE: Multidetector CT imaging of the head and cervical spine was performed following the standard protocol without intravenous contrast. Multiplanar CT image reconstructions of the cervical spine were also generated. RADIATION DOSE REDUCTION: This exam was performed according to the departmental dose-optimization program which includes automated exposure control, adjustment of the mA and/or kV according to patient size and/or use of iterative reconstruction technique. COMPARISON:  05/05/2012, CT chest 09/28/2011 FINDINGS: CT HEAD FINDINGS Brain: No evidence of acute infarction, hemorrhage, hydrocephalus, extra-axial collection or mass lesion/mass effect. Moderate-advanced low-density changes within the periventricular and subcortical white matter compatible with chronic microvascular ischemic change. Mild-moderate diffuse cerebral volume loss. Vascular: Atherosclerotic calcifications involving the large vessels of the skull base. No unexpected hyperdense vessel. Skull: Normal. Negative for fracture or focal lesion. Sinuses/Orbits: No acute finding. Other: Soft tissue swelling with a 1.8 x 0.8 x 1.5 cm left frontal scalp hematoma.  CT CERVICAL SPINE FINDINGS Alignment: Facet joints are aligned without dislocation or traumatic listhesis. Dens and lateral masses are aligned. Skull base and vertebrae: No acute fracture. No primary bone lesion or focal pathologic process. Soft tissues and spinal canal: No prevertebral fluid  or swelling. No visible canal hematoma. Disc levels: Multilevel intervertebral disc height loss with uncovertebral spurring, most pronounced C5-6 and C6-7. Relatively mild facet arthropathy is most pronounced on the right at C4-5. Bulky anterior endplate osteophytes most prevalent at C3-4 and C4-5. Upper chest: Included lung apices are clear. Other: Aortic and bilateral carotid atherosclerosis. Left thyroid lobe nodule with coarse calcification measuring approximately 2.0 x 1.4 cm, grossly stable compared to prior CT from 2013 Stability for greater than 5 years implies benignity; no biopsy or followup indicated (ref: J Am Coll Radiol. 2015 Feb;12(2): 143-50). IMPRESSION: 1. No acute intracranial abnormality. 2. Moderate-advanced chronic microvascular ischemic change and cerebral volume loss. 3. No acute cervical spine fracture or subluxation. 4. Moderate multilevel degenerative changes of the cervical spine. Aortic Atherosclerosis (ICD10-I70.0). Electronically Signed   By: Davina Poke D.O.   On: 05/29/2021 11:44   CT Cervical Spine Wo Contrast  Result Date: 05/29/2021 CLINICAL DATA:  Witnessed fall, left frontal gray shin EXAM: CT HEAD WITHOUT CONTRAST CT CERVICAL SPINE WITHOUT CONTRAST TECHNIQUE: Multidetector CT imaging of the head and cervical spine was performed following the standard protocol without intravenous contrast. Multiplanar CT image reconstructions of the cervical spine were also generated. RADIATION DOSE REDUCTION: This exam was performed according to the departmental dose-optimization program which includes automated exposure control, adjustment of the mA and/or kV according to patient size and/or use  of iterative reconstruction technique. COMPARISON:  05/05/2012, CT chest 09/28/2011 FINDINGS: CT HEAD FINDINGS Brain: No evidence of acute infarction, hemorrhage, hydrocephalus, extra-axial collection or mass lesion/mass effect. Moderate-advanced low-density changes within the periventricular and subcortical white matter compatible with chronic microvascular ischemic change. Mild-moderate diffuse cerebral volume loss. Vascular: Atherosclerotic calcifications involving the large vessels of the skull base. No unexpected hyperdense vessel. Skull: Normal. Negative for fracture or focal lesion. Sinuses/Orbits: No acute finding. Other: Soft tissue swelling with a 1.8 x 0.8 x 1.5 cm left frontal scalp hematoma. CT CERVICAL SPINE FINDINGS Alignment: Facet joints are aligned without dislocation or traumatic listhesis. Dens and lateral masses are aligned. Skull base and vertebrae: No acute fracture. No primary bone lesion or focal pathologic process. Soft tissues and spinal canal: No prevertebral fluid or swelling. No visible canal hematoma. Disc levels: Multilevel intervertebral disc height loss with uncovertebral spurring, most pronounced C5-6 and C6-7. Relatively mild facet arthropathy is most pronounced on the right at C4-5. Bulky anterior endplate osteophytes most prevalent at C3-4 and C4-5. Upper chest: Included lung apices are clear. Other: Aortic and bilateral carotid atherosclerosis. Left thyroid lobe nodule with coarse calcification measuring approximately 2.0 x 1.4 cm, grossly stable compared to prior CT from 2013 Stability for greater than 5 years implies benignity; no biopsy or followup indicated (ref: J Am Coll Radiol. 2015 Feb;12(2): 143-50). IMPRESSION: 1. No acute intracranial abnormality. 2. Moderate-advanced chronic microvascular ischemic change and cerebral volume loss. 3. No acute cervical spine fracture or subluxation. 4. Moderate multilevel degenerative changes of the cervical spine. Aortic  Atherosclerosis (ICD10-I70.0). Electronically Signed   By: Davina Poke D.O.   On: 05/29/2021 11:44   DG Hip Unilat W or Wo Pelvis 2-3 Views Left  Result Date: 05/29/2021 CLINICAL DATA:  Fall.  Left hip pain. EXAM: DG HIP (WITH OR WITHOUT PELVIS) 2-3V LEFT COMPARISON:  Radiographs 05/30/2011. FINDINGS: The mineralization and alignment are normal. There is no evidence of acute fracture or dislocation. No evidence of femoral head avascular necrosis. There are mild degenerative changes of the hips, slightly worse on the left. Lower lumbar spondylosis and  mild bilateral sacral at degenerative changes are noted. IMPRESSION: No evidence of acute fracture or dislocation. Mild degenerative changes as described. Electronically Signed   By: Richardean Sale M.D.   On: 05/29/2021 11:24    Procedures Procedures    Medications Ordered in ED Medications  losartan (COZAAR) tablet 50 mg (50 mg Oral Given 05/29/21 1217)  losartan (COZAAR) tablet 50 mg (50 mg Oral Given 05/29/21 1259)  fentaNYL (SUBLIMAZE) injection 25 mcg (25 mcg Intravenous Given 05/29/21 1216)    ED Course/ Medical Decision Making/ A&P                           Medical Decision Making 88 year old female presented after trip and fall today.  She struck her left frontal scalp on the ground, additionally has some pain at the left hip and right ankle.  Small abrasion present, no repairable defects.  Patient is not on a blood thinner.  Given patient's age and will need CT of the head to rule out intracranial injury.  Additionally will obtain CT cervical spine.  X-ray of the left hip and right ankle were also ordered.  Amount and/or Complexity of Data Reviewed Radiology: ordered.    Details: CT head: I reviewed CT head did not appreciate any obvious intracranial hemorrhage.  Agree with radiologist interpretation above. CT cervical spine: I reviewed CT cervical spine, I do not appreciate any obvious fracture or dislocation.  I agree with  radiology interpretation above, degenerative disease. Three-view x-ray of the right ankle: I reviewed the x-ray of the ankle, no obvious fractures or dislocations present, osteoarthritis is present.  I agree with radiologist interpretation above. 2 view x-ray of the chest: I reviewed chest x-ray, no obvious acute findings.  I agree with radiologist interpretation above, of note lower suspicion for pneumonia as patient without symptoms at this time, will encourage patient to follow-up with PCP for repeat chest x-ray Three-view x-ray of the left hip with pelvis: I do not appreciate any obvious acute fracture or dislocation.  I agree with with radiologist interpretation above.  Risk OTC drugs. Prescription drug management.  Thankfully today there were no acute findings on patient's imaging studies as above.  Patient is now ambulating around the emergency department without assistance or difficulty.  She reports significant improvement of her left hip pain and reports it is no longer causing her pain and she has no pain with ambulation.  Patient does report some continued pain of the right ankle she has some swelling and ecchymosis there, no acute fracture dislocation on x-ray today however sprain/strain or occult fracture is on the differential given patient's age.  We will apply cam boot to the right ankle and have the patient use her walker at home and attempt nonweightbearing is much as possible on the right ankle.  On reassessment patient reports left hip pain nearly resolved, she has full range of motion including internal/external rotation without pain.  Patient plans to follow-up with orthopedic specialist for recheck of the ankle as well as the hip.  We will refer to on-call orthopedist Dr[facility]inf[facility]  Of note patient reports Tdap is up-to-date, I encouraged good wound care of the small abrasion on the forehead.  Of note patient was found to be hypertensive in the emergency department,  she did not take her morning medications.  Patient was given her home dose losartan today.  Patient apparently also takes amlodipine but I am unable to find  that in her record.  Patient will take the remainder of her daily medications when she gets home today.  She is asymptomatic regarding her elevated blood pressure reading, we discussed signs/symptoms of hypertensive urgency/emergency and that she should return immediately to the ER if they occur.  Blood pressure at discharge 182/87.  I asked the patient call her primary care provider to have blood pressure rechecked in the next 1-3 days.  Patient as well as her daughter at bedside are in agreement with plan.  At this time there does not appear to be any evidence of an acute emergency medical condition and the patient appears stable for discharge with appropriate outpatient follow up. Diagnosis was discussed with patient who verbalizes understanding of care plan and is agreeable to discharge. I have discussed return precautions with patient and daughter at bedside who verbalizes understanding. Patient encouraged to follow-up with their PCP and orthopedist. All questions answered.  Patient's case discussed with Dr. Tomi Bamberger who agrees with plan to discharge with follow-up.   Note: Portions of this report may have been transcribed using voice recognition software. Every effort was made to ensure accuracy; however, inadvertent computerized transcription errors may still be present.          Final Clinical Impression(s) / ED Diagnoses Final diagnoses:  Fall, initial encounter  Injury of head, initial encounter  Acute right ankle pain  Left hip pain  Elevated blood pressure reading    Rx / DC Orders ED Discharge Orders     None         Gari Crown 05/29/21 1415    Gari Crown 05/29/21 1452    Dorie Rank, MD 05/30/21 [phone]

## 2021-05-29 NOTE — Progress Notes (Signed)
Orthopedic Tech Progress Note Patient Details:  Melissa Morton 09-25-1933 [id number]  Ortho Devices Type of Ortho Device: CAM walker Ortho Device/Splint Interventions: Ordered, Application, Adjustment   Post Interventions Patient Tolerated: Well Instructions Provided: Adjustment of device  Kamyrah Feeser A Pedro Whiters 05/29/2021, 1:46 PM

## 2021-05-29 NOTE — ED Notes (Signed)
Ortho tech paged  

## 2021-05-29 NOTE — ED Notes (Signed)
Patient given discharge instructions. Questions were answered. Patient verbalized understanding of discharge instructions and care at home.  Discharged with family 

## 2021-05-29 NOTE — ED Triage Notes (Signed)
Patient arrives via EMS due to having a witnessed fall at a family members house. Per ems, the patient put her purse on the floor and tripped on the strap. Patient hit her head on the counter top. Patient did not lose consciousness and she does not take blood thinners.   Patient did develop worsening dizziness and nausea after her fall. She was also noted to be hypertensive en route (220/100).   Alert and oriented x4 on arrival. Patient reports pain to head, left hip, and right ankle. Rates pain a 5/10.

## 2021-06-01 ENCOUNTER — Encounter: Payer: Self-pay | Admitting: Hematology

## 2021-06-01 DIAGNOSIS — R928 Other abnormal and inconclusive findings on diagnostic imaging of breast: Secondary | ICD-10-CM | POA: Diagnosis not present

## 2021-06-01 DIAGNOSIS — R921 Mammographic calcification found on diagnostic imaging of breast: Secondary | ICD-10-CM | POA: Diagnosis not present

## 2021-06-17 ENCOUNTER — Other Ambulatory Visit: Payer: Self-pay

## 2021-06-17 ENCOUNTER — Encounter: Payer: Self-pay | Admitting: Hematology

## 2021-06-17 DIAGNOSIS — N6312 Unspecified lump in the right breast, upper inner quadrant: Secondary | ICD-10-CM | POA: Diagnosis not present

## 2021-06-17 DIAGNOSIS — R921 Mammographic calcification found on diagnostic imaging of breast: Secondary | ICD-10-CM | POA: Diagnosis not present

## 2021-06-26 DIAGNOSIS — M179 Osteoarthritis of knee, unspecified: Secondary | ICD-10-CM | POA: Diagnosis not present

## 2021-06-30 DIAGNOSIS — R001 Bradycardia, unspecified: Secondary | ICD-10-CM | POA: Diagnosis not present

## 2021-06-30 DIAGNOSIS — R42 Dizziness and giddiness: Secondary | ICD-10-CM | POA: Diagnosis not present

## 2021-06-30 DIAGNOSIS — R8290 Unspecified abnormal findings in urine: Secondary | ICD-10-CM | POA: Diagnosis not present

## 2021-06-30 DIAGNOSIS — I10 Essential (primary) hypertension: Secondary | ICD-10-CM | POA: Diagnosis not present

## 2021-06-30 DIAGNOSIS — I491 Atrial premature depolarization: Secondary | ICD-10-CM | POA: Diagnosis not present

## 2021-07-15 DIAGNOSIS — D72829 Elevated white blood cell count, unspecified: Secondary | ICD-10-CM | POA: Diagnosis not present

## 2021-07-15 DIAGNOSIS — N183 Chronic kidney disease, stage 3 unspecified: Secondary | ICD-10-CM | POA: Diagnosis not present

## 2021-07-24 DIAGNOSIS — M179 Osteoarthritis of knee, unspecified: Secondary | ICD-10-CM | POA: Diagnosis not present

## 2021-08-11 DIAGNOSIS — H04123 Dry eye syndrome of bilateral lacrimal glands: Secondary | ICD-10-CM | POA: Diagnosis not present

## 2021-08-11 DIAGNOSIS — H52223 Regular astigmatism, bilateral: Secondary | ICD-10-CM | POA: Diagnosis not present

## 2021-08-11 DIAGNOSIS — H5203 Hypermetropia, bilateral: Secondary | ICD-10-CM | POA: Diagnosis not present

## 2021-08-17 DIAGNOSIS — L821 Other seborrheic keratosis: Secondary | ICD-10-CM | POA: Diagnosis not present

## 2021-08-17 DIAGNOSIS — D225 Melanocytic nevi of trunk: Secondary | ICD-10-CM | POA: Diagnosis not present

## 2021-08-17 DIAGNOSIS — L814 Other melanin hyperpigmentation: Secondary | ICD-10-CM | POA: Diagnosis not present

## 2021-08-17 DIAGNOSIS — L578 Other skin changes due to chronic exposure to nonionizing radiation: Secondary | ICD-10-CM | POA: Diagnosis not present

## 2021-08-24 DIAGNOSIS — M179 Osteoarthritis of knee, unspecified: Secondary | ICD-10-CM | POA: Diagnosis not present

## 2021-09-01 ENCOUNTER — Ambulatory Visit (HOSPITAL_BASED_OUTPATIENT_CLINIC_OR_DEPARTMENT_OTHER)
Admission: RE | Admit: 2021-09-01 | Discharge: 2021-09-01 | Disposition: A | Discharge status: Home / Self Care | Payer: Medicare Other | Source: Ambulatory Visit | Attending: Family Medicine | Admitting: Family Medicine

## 2021-09-01 ENCOUNTER — Other Ambulatory Visit (HOSPITAL_BASED_OUTPATIENT_CLINIC_OR_DEPARTMENT_OTHER): Payer: Self-pay | Admitting: Family Medicine

## 2021-09-01 DIAGNOSIS — M7989 Other specified soft tissue disorders: Secondary | ICD-10-CM | POA: Diagnosis not present

## 2021-09-01 DIAGNOSIS — I499 Cardiac arrhythmia, unspecified: Secondary | ICD-10-CM | POA: Diagnosis not present

## 2021-09-02 ENCOUNTER — Emergency Department (HOSPITAL_BASED_OUTPATIENT_CLINIC_OR_DEPARTMENT_OTHER): Payer: Medicare Other | Admitting: Radiology

## 2021-09-02 ENCOUNTER — Other Ambulatory Visit: Payer: Self-pay

## 2021-09-02 ENCOUNTER — Encounter (HOSPITAL_BASED_OUTPATIENT_CLINIC_OR_DEPARTMENT_OTHER): Payer: Self-pay | Admitting: Emergency Medicine

## 2021-09-02 ENCOUNTER — Emergency Department (HOSPITAL_BASED_OUTPATIENT_CLINIC_OR_DEPARTMENT_OTHER): Payer: Medicare Other

## 2021-09-02 ENCOUNTER — Emergency Department (HOSPITAL_BASED_OUTPATIENT_CLINIC_OR_DEPARTMENT_OTHER)
Admission: EM | Admit: 2021-09-02 | Discharge: 2021-09-02 | Disposition: A | Discharge status: Home / Self Care | Payer: Medicare Other | Attending: Emergency Medicine | Admitting: Emergency Medicine

## 2021-09-02 DIAGNOSIS — Z853 Personal history of malignant neoplasm of breast: Secondary | ICD-10-CM | POA: Diagnosis not present

## 2021-09-02 DIAGNOSIS — N189 Chronic kidney disease, unspecified: Secondary | ICD-10-CM | POA: Insufficient documentation

## 2021-09-02 DIAGNOSIS — R2231 Localized swelling, mass and lump, right upper limb: Secondary | ICD-10-CM

## 2021-09-02 DIAGNOSIS — I10 Essential (primary) hypertension: Secondary | ICD-10-CM

## 2021-09-02 DIAGNOSIS — M79641 Pain in right hand: Secondary | ICD-10-CM | POA: Insufficient documentation

## 2021-09-02 DIAGNOSIS — I517 Cardiomegaly: Secondary | ICD-10-CM | POA: Diagnosis not present

## 2021-09-02 DIAGNOSIS — I129 Hypertensive chronic kidney disease with stage 1 through stage 4 chronic kidney disease, or unspecified chronic kidney disease: Secondary | ICD-10-CM | POA: Diagnosis not present

## 2021-09-02 DIAGNOSIS — M7989 Other specified soft tissue disorders: Secondary | ICD-10-CM | POA: Diagnosis not present

## 2021-09-02 LAB — CBC WITH DIFFERENTIAL/PLATELET
Abs Immature Granulocytes: 0.05 10*3/uL (ref 0.00–0.07)
Basophils Absolute: 0.1 10*3/uL (ref 0.0–0.1)
Basophils Relative: 1 %
Eosinophils Absolute: 0.2 10*3/uL (ref 0.0–0.5)
Eosinophils Relative: 2 %
HCT: 36.9 % (ref 36.0–46.0)
Hemoglobin: 11.6 g/dL — ABNORMAL LOW (ref 12.0–15.0)
Immature Granulocytes: 1 %
Lymphocytes Relative: 26 %
Lymphs Abs: 2.2 10*3/uL (ref 0.7–4.0)
MCH: 29.7 pg (ref 26.0–34.0)
MCHC: 31.4 g/dL (ref 30.0–36.0)
MCV: 94.4 fL (ref 80.0–100.0)
Monocytes Absolute: 0.7 10*3/uL (ref 0.1–1.0)
Monocytes Relative: 8 %
Neutro Abs: 5.5 10*3/uL (ref 1.7–7.7)
Neutrophils Relative %: 62 %
Platelets: 298 10*3/uL (ref 150–400)
RBC: 3.91 MIL/uL (ref 3.87–5.11)
RDW: 14.9 % (ref 11.5–15.5)
WBC: 8.6 10*3/uL (ref 4.0–10.5)
nRBC: 0 % (ref 0.0–0.2)

## 2021-09-02 LAB — C-REACTIVE PROTEIN: CRP: 0.8 mg/dL (ref <1.0)

## 2021-09-02 LAB — BASIC METABOLIC PANEL
Anion gap: 10 (ref 5–15)
BUN: 18 mg/dL (ref 8–23)
CO2: 26 mmol/L (ref 22–32)
Calcium: 9.8 mg/dL (ref 8.9–10.3)
Chloride: 106 mmol/L (ref 98–111)
Creatinine, Ser: 1.21 mg/dL — ABNORMAL HIGH (ref 0.44–1.00)
GFR, Estimated: 44 mL/min — ABNORMAL LOW (ref >60)
Glucose, Bld: 93 mg/dL (ref 70–99)
Potassium: 3.9 mmol/L (ref 3.5–5.1)
Sodium: 142 mmol/L (ref 135–145)

## 2021-09-02 LAB — SEDIMENTATION RATE: Sed Rate: 26 mm/hr — ABNORMAL HIGH (ref 0–22)

## 2021-09-02 NOTE — ED Triage Notes (Signed)
Pt c/o of swelling to right hand for the past few days. Ultra sound was conducted of same. Pt returned today for unresolved swelling. Pt denis pain.  ?

## 2021-09-02 NOTE — ED Notes (Signed)
Discharge instructions reviewed and explained, pt verbalized understanding and had no further questions at discharge.  ?

## 2021-09-02 NOTE — ED Provider Notes (Signed)
?[facility] ?Provider Note ? ? ?CSN: [id number] ?Arrival date & time: 09/02/21  1433 ? ?  ? ?History ? ?Chief Complaint  ?Patient presents with  ? Hand Pain  ? ? ?Melissa Morton is a 88 y.o. female. ? ?HPI ? ?  ? ?88 year old patient comes in with chief complaint of hand pain.  Patient here with her daughters. ? ?Patient has history of hypertension, chronic kidney disease.  She also has history of gout. ? ?According to the family, patient has had right hand swelling for the last few days.  There is no associated pain.  The swelling is localized to the right hand area only, and it is worse in the morning.  As the day progresses, the swelling slowly comes down. ? ?This saw the APP at the primary care yesterday.  Ultrasound DVT was ordered, which is negative. ? ?Review of system is negative for any nausea, vomiting, fevers, chills, increased swelling.  There is no history of heavy smoking or lung cancer.  Patient does indicate that she had breast cancer, but she has had mammograms as recently as within the last year which were normal.  Her breast exam by PCP was reassuring. ? ?Patient denies any trauma to her hand.  She does crochet, and has OA history, but there is no pain right now. ? ?Patient's BP is also elevated per family, patient has not been taking her Lasix as prescribed because of the increased urine output.  There has not been any new medications prescribed recently. ? ?Home Medications ?Prior to Admission medications   ?Medication Sig Start Date End Date Taking? Authorizing Provider  ?acetaminophen (TYLENOL) 325 MG tablet Take 650 mg by mouth every 6 (six) hours as needed for moderate pain (pain).     [provider]  ?alendronate (FOSAMAX) 70 MG tablet Take 70 mg by mouth once a week. On Fridays. Take with a full glass of water on an empty stomach.    [provider]  ?allopurinol (ZYLOPRIM) 300 MG tablet Take 300 mg by mouth daily.    [provider]   ?colchicine 0.6 MG tablet Take 0.6 mg by mouth as needed (for gout flare).     [provider]  ?diphenhydramine-acetaminophen (TYLENOL PM) 25-500 MG TABS Take 2 tablets by mouth at bedtime as needed (sleep).     [provider]  ?esomeprazole (NEXIUM) 40 MG capsule Take 40 mg by mouth daily.  10/01/14   [provider]  ?gabapentin (NEURONTIN) 100 MG capsule Take 1 capsule (100 mg total) by mouth 3 (three) times daily. 01/29/20   Tasia Catchings, Amy V, PA-C  ?losartan (COZAAR) 100 MG tablet Take 100 mg by mouth daily.    [provider]  ?polycarbophil (FIBERCON) 625 MG tablet Take 625 mg by mouth daily.    [provider]  ?PROAIR HFA 108 (90 BASE) MCG/ACT inhaler Inhale 2 puffs into the lungs as needed.  02/04/15   [provider]  ?promethazine (PHENERGAN) 12.5 MG tablet Take 12.5 mg by mouth every 6 (six) hours as needed. 06/18/19   [provider]  ?simvastatin (ZOCOR) 40 MG tablet Take 40 mg by mouth every evening.    [provider]  ?solifenacin (VESICARE) 10 MG tablet Take 10 mg by mouth daily.    [provider]  ?traMADol (ULTRAM) 50 MG tablet Take 50 mg by mouth every 6 (six) hours as needed for moderate pain or severe pain (pain).    [provider]  ?   ? ?  Allergies    ?Hydromorphone hcl, Nsaids, and Prednisone   ? ?Review of Systems   ?Review of Systems  ?All other systems reviewed and are negative. ? ?Physical Exam ?Updated Vital Signs ?BP (!) 198/86 (BP Location: Right Arm)   Pulse (!) 52   Temp 98.4 ?F (36.9 ?C)   Resp 18   Ht '5\' 3"'$  (1.6 m)   Wt 75.8 kg   SpO2 96%   BMI 29.58 kg/m?  ?Physical Exam ?Vitals and nursing note reviewed.  ?Constitutional:   ?   Appearance: She is well-developed.  ?HENT:  ?   Head: Atraumatic.  ?Cardiovascular:  ?   Rate and Rhythm: Normal rate.  ?Pulmonary:  ?   Effort: Pulmonary effort is normal.  ?Musculoskeletal:  ?   Cervical back: Normal range of motion and neck supple.  ?   Comments:  Patient has subtle swelling over the right upper extremity, primarily over the distal forearm and dorsal part of the hand radially.  There is no pitting edema.  Patient is range of motion of the wrist and IP joints of the thumb and fingers are normal.  ?Skin: ?   General: Skin is warm and dry.  ?Neurological:  ?   Mental Status: She is alert and oriented to person, place, and time.  ? ? ?ED Results / Procedures / Treatments   ?Labs ?(all labs ordered are listed, but only abnormal results are displayed) ?Labs Reviewed  ?BASIC METABOLIC PANEL - Abnormal; Notable for the following components:  ?    Result Value  ? Creatinine, Ser 1.21 (*)   ? GFR, Estimated 44 (*)   ? All other components within normal limits  ?CBC WITH DIFFERENTIAL/PLATELET - Abnormal; Notable for the following components:  ? Hemoglobin 11.6 (*)   ? All other components within normal limits  ?SEDIMENTATION RATE - Abnormal; Notable for the following components:  ? Sed Rate 26 (*)   ? All other components within normal limits  ?C-REACTIVE PROTEIN  ? ? ?EKG ?None ? ?Radiology ?US Venous Img Upper Uni Right(DVT) ? ?Result Date: 09/01/2021 ?CLINICAL DATA:  Swelling of the right hand. EXAM: RIGHT UPPER EXTREMITY VENOUS DOPPLER ULTRASOUND TECHNIQUE: Gray-scale sonography with graded compression, as well as color Doppler and duplex ultrasound were performed to evaluate the upper extremity deep venous system from the level of the subclavian vein and including the jugular, axillary, basilic, radial, ulnar and upper cephalic vein. Spectral Doppler was utilized to evaluate flow at rest and with distal augmentation maneuvers. COMPARISON:  None Available. FINDINGS: Contralateral Subclavian Vein: Respiratory phasicity is normal and symmetric with the symptomatic side. No evidence of thrombus. Normal compressibility. Internal Jugular Vein: No evidence of thrombus. Normal compressibility, respiratory phasicity and response to augmentation. Subclavian Vein: No evidence  of thrombus. Normal compressibility, respiratory phasicity and response to augmentation. Axillary Vein: No evidence of thrombus. Normal compressibility, respiratory phasicity and response to augmentation. Cephalic Vein: No evidence of thrombus. Normal compressibility, respiratory phasicity and response to augmentation. Basilic Vein: No evidence of thrombus. Normal compressibility, respiratory phasicity and response to augmentation. Brachial Veins: No evidence of thrombus. Normal compressibility, respiratory phasicity and response to augmentation. Radial Veins: No evidence of thrombus. Normal compressibility, respiratory phasicity and response to augmentation. Ulnar Veins: No evidence of thrombus. Normal compressibility, respiratory phasicity and response to augmentation. Venous Reflux:  None visualized. Other Findings:  None visualized. IMPRESSION: No evidence of DVT within the right upper extremity. Electronically Signed   By: Telford Nab M.D.   On:  09/01/2021 20:45  ? ?DG Chest Port 1 View ? ?Result Date: 09/02/2021 ?CLINICAL DATA:  eval for any mass EXAM: PORTABLE CHEST 1 VIEW COMPARISON:  Radiograph 05/29/2021 FINDINGS: Mildly enlarged cardiac silhouette. There is no focal airspace disease. There are chronic appearing interstitial changes. No pleural effusion. No pneumothorax. No acute osseous abnormality. Thoracic spondylosis. IMPRESSION: Mildly enlarged cardiac silhouette.  No focal airspace disease. Electronically Signed   By: Maurine Simmering M.D.   On: 09/02/2021 15:44  ? ?DG Hand Complete Right ? ?Result Date: 09/02/2021 ?CLINICAL DATA:  hand swelling - eval for any deposits EXAM: RIGHT HAND - COMPLETE 3+ VIEW COMPARISON:  None Available. FINDINGS: There is no evidence of acute fracture. Alignment is normal. There is moderate thumb CMC and MCP joint osteoarthritis. There is mild-to-moderate diffuse interphalangeal joint osteoarthritis, worst of the thumb IP joint. There are no osseous erosions. No periostitis.  There is mild generalized soft tissue swelling. No soft tissue calcifications. IMPRESSION: Mild generalized soft tissue swelling. No acute osseous abnormality. Base of thumb and diffuse interphalangeal joint

## 2021-09-02 NOTE — Discharge Instructions (Addendum)
You are seen in the ER for hand swelling and we had discussed the elevated blood pressure as well. ? ?Elevated blood pressure is multifactorial.  Treatment of it is also often specialized.  We recommend that you read the instructions below provided on blood pressure management and also utilize the blood pressure form sheet to record Melissa Morton blood pressure for at least the next 7 days.  Thereafter, please present to your primary care doctor with the blood pressure log, to see if she needs any adjustments to the medications. ? ?The hand swelling cause is unclear.  There is no evidence of infection.  There is no evidence of gout or calcification.  White count, renal function and inflammatory markers are reassuring. ? ?As discussed, we recommend that he continue follow-up with your PCP in regards to the hand swelling. ?

## 2021-09-08 ENCOUNTER — Encounter: Payer: Self-pay | Admitting: Registered Nurse

## 2021-09-08 ENCOUNTER — Ambulatory Visit
Admission: RE | Admit: 2021-09-08 | Discharge: 2021-09-08 | Disposition: A | Discharge status: Home / Self Care | Payer: Medicare Other | Source: Ambulatory Visit | Attending: Registered Nurse | Admitting: Registered Nurse

## 2021-09-08 ENCOUNTER — Ambulatory Visit (INDEPENDENT_AMBULATORY_CARE_PROVIDER_SITE_OTHER): Payer: Medicare Other | Admitting: Registered Nurse

## 2021-09-08 VITALS — BP 128/66 | HR 60 | Temp 97.8°F | Resp 16 | Ht 63.0 in

## 2021-09-08 DIAGNOSIS — I10 Essential (primary) hypertension: Secondary | ICD-10-CM

## 2021-09-08 DIAGNOSIS — S99921A Unspecified injury of right foot, initial encounter: Secondary | ICD-10-CM

## 2021-09-08 DIAGNOSIS — N3000 Acute cystitis without hematuria: Secondary | ICD-10-CM

## 2021-09-08 DIAGNOSIS — S92901A Unspecified fracture of right foot, initial encounter for closed fracture: Secondary | ICD-10-CM

## 2021-09-08 DIAGNOSIS — S92344A Nondisplaced fracture of fourth metatarsal bone, right foot, initial encounter for closed fracture: Secondary | ICD-10-CM | POA: Diagnosis not present

## 2021-09-08 DIAGNOSIS — M79671 Pain in right foot: Secondary | ICD-10-CM | POA: Diagnosis not present

## 2021-09-08 DIAGNOSIS — S92354A Nondisplaced fracture of fifth metatarsal bone, right foot, initial encounter for closed fracture: Secondary | ICD-10-CM | POA: Diagnosis not present

## 2021-09-08 DIAGNOSIS — S92334A Nondisplaced fracture of third metatarsal bone, right foot, initial encounter for closed fracture: Secondary | ICD-10-CM | POA: Diagnosis not present

## 2021-09-08 DIAGNOSIS — R319 Hematuria, unspecified: Secondary | ICD-10-CM | POA: Diagnosis not present

## 2021-09-08 DIAGNOSIS — N390 Urinary tract infection, site not specified: Secondary | ICD-10-CM

## 2021-09-08 DIAGNOSIS — S92324A Nondisplaced fracture of second metatarsal bone, right foot, initial encounter for closed fracture: Secondary | ICD-10-CM | POA: Diagnosis not present

## 2021-09-08 DIAGNOSIS — M7989 Other specified soft tissue disorders: Secondary | ICD-10-CM | POA: Diagnosis not present

## 2021-09-08 LAB — COMPREHENSIVE METABOLIC PANEL
ALT: 7 U/L (ref 0–35)
AST: 13 U/L (ref 0–37)
Albumin: 4.3 g/dL (ref 3.5–5.2)
Alkaline Phosphatase: 103 U/L (ref 39–117)
BUN: 30 mg/dL — ABNORMAL HIGH (ref 6–23)
CO2: 28 mEq/L (ref 19–32)
Calcium: 9.4 mg/dL (ref 8.4–10.5)
Chloride: 103 mEq/L (ref 96–112)
Creatinine, Ser: 1.53 mg/dL — ABNORMAL HIGH (ref 0.40–1.20)
GFR: 30.75 mL/min — ABNORMAL LOW (ref >60.00)
Glucose, Bld: 95 mg/dL (ref 70–99)
Potassium: 4 mEq/L (ref 3.5–5.1)
Sodium: 143 mEq/L (ref 135–145)
Total Bilirubin: 0.4 mg/dL (ref 0.2–1.2)
Total Protein: 6.9 g/dL (ref 6.0–8.3)

## 2021-09-08 LAB — CBC WITH DIFFERENTIAL/PLATELET
Basophils Absolute: 0.1 10*3/uL (ref 0.0–0.1)
Basophils Relative: 0.5 % (ref 0.0–3.0)
Eosinophils Absolute: 0.2 10*3/uL (ref 0.0–0.7)
Eosinophils Relative: 1.8 % (ref 0.0–5.0)
HCT: 36 % (ref 36.0–46.0)
Hemoglobin: 11.8 g/dL — ABNORMAL LOW (ref 12.0–15.0)
Lymphocytes Relative: 22.1 % (ref 12.0–46.0)
Lymphs Abs: 2.6 10*3/uL (ref 0.7–4.0)
MCHC: 32.7 g/dL (ref 30.0–36.0)
MCV: 93.3 fl (ref 78.0–100.0)
Monocytes Absolute: 1 10*3/uL (ref 0.1–1.0)
Monocytes Relative: 8.3 % (ref 3.0–12.0)
Neutro Abs: 7.9 10*3/uL — ABNORMAL HIGH (ref 1.4–7.7)
Neutrophils Relative %: 67.3 % (ref 43.0–77.0)
Platelets: 264 10*3/uL (ref 150.0–400.0)
RBC: 3.86 Mil/uL — ABNORMAL LOW (ref 3.87–5.11)
RDW: 15.7 % — ABNORMAL HIGH (ref 11.5–15.5)
WBC: 11.8 10*3/uL — ABNORMAL HIGH (ref 4.0–10.5)

## 2021-09-08 LAB — POCT URINALYSIS DIPSTICK
Bilirubin, UA: NEGATIVE
Blood, UA: NEGATIVE
Glucose, UA: NEGATIVE
Ketones, UA: NEGATIVE
Leukocytes, UA: NEGATIVE
Nitrite, UA: NEGATIVE
Protein, UA: NEGATIVE
Spec Grav, UA: >=1.03 — AB (ref 1.010–1.025)
Urobilinogen, UA: 0.2 E.U./dL
pH, UA: 7 (ref 5.0–8.0)

## 2021-09-08 LAB — LIPID PANEL
Cholesterol: 160 mg/dL (ref 0–200)
HDL: 78.2 mg/dL (ref >39.00)
LDL Cholesterol: 51 mg/dL (ref 0–99)
NonHDL: 81.83
Total CHOL/HDL Ratio: 2
Triglycerides: 154 mg/dL — ABNORMAL HIGH (ref 0.0–149.0)
VLDL: 30.8 mg/dL (ref 0.0–40.0)

## 2021-09-08 NOTE — Patient Instructions (Addendum)
Ms. Harwood -  ? ?Great to meet you! ? ?Call with concerns ? ?See you in 3 months ? ?I will call with Xray results. - These will be done at [facility] at [address] ? ?Let me know when you need medications refilled ? ?Thank you ? ?Rich  ?

## 2021-09-10 ENCOUNTER — Other Ambulatory Visit: Payer: Self-pay | Admitting: Registered Nurse

## 2021-09-10 DIAGNOSIS — N3000 Acute cystitis without hematuria: Secondary | ICD-10-CM

## 2021-09-10 DIAGNOSIS — S92334A Nondisplaced fracture of third metatarsal bone, right foot, initial encounter for closed fracture: Secondary | ICD-10-CM | POA: Diagnosis not present

## 2021-09-10 DIAGNOSIS — S92344A Nondisplaced fracture of fourth metatarsal bone, right foot, initial encounter for closed fracture: Secondary | ICD-10-CM | POA: Diagnosis not present

## 2021-09-10 DIAGNOSIS — S92324A Nondisplaced fracture of second metatarsal bone, right foot, initial encounter for closed fracture: Secondary | ICD-10-CM | POA: Diagnosis not present

## 2021-09-10 DIAGNOSIS — S92354A Nondisplaced fracture of fifth metatarsal bone, right foot, initial encounter for closed fracture: Secondary | ICD-10-CM | POA: Diagnosis not present

## 2021-09-10 LAB — URINE CULTURE
MICRO NUMBER:: 13372239
SPECIMEN QUALITY:: ADEQUATE

## 2021-09-10 MED ORDER — SULFAMETHOXAZOLE-TRIMETHOPRIM 800-160 MG PO TABS: 1.0000 | ORAL_TABLET | Freq: Two times a day (BID) | ORAL | 0 refills | Status: DC

## 2021-09-10 NOTE — Progress Notes (Signed)
? ?New Patient Office Visit ? ?Subjective:  ?Patient ID: Melissa Morton, female    DOB: [birth year]  Age: 88 y.o. MRN: [id number] ? ?CC:  ?Chief Complaint  ?Patient presents with  ? Establish Care  ?  Pt states she is doing well ?Est Care ?[place] a few days ago on right ankle hurts to apply pressure to foot   ? ? ?HPI ?Melissa Morton presents to establish care ?Histories reviewed and updated with patient.  ? ?Two acute concerns ? ?UTI ?Malodorous urine ?Has had cognitive changes with UTI in the past  ?No flank pain, fevers, chills, sweats.  ? ?Foot pain ?Mechanical fall when stepping out of car ?No other pains or bruises ?Had injuredcontralateral foot in the past, has used aircast brace for this foot ?Some bruising and swelling, cannot bear weight.  ?No redness or warmth ? ?Outpatient Encounter Medications as of 09/08/2021  ?Medication Sig  ? acetaminophen (TYLENOL) 325 MG tablet Take 650 mg by mouth every 6 (six) hours as needed for moderate pain (pain).   ? alendronate (FOSAMAX) 70 MG tablet Take 70 mg by mouth once a week. On Fridays. Take with a full glass of water on an empty stomach.  ? allopurinol (ZYLOPRIM) 300 MG tablet Take 300 mg by mouth daily.  ? colchicine 0.6 MG tablet Take 0.6 mg by mouth as needed (for gout flare).   ? diphenhydramine-acetaminophen (TYLENOL PM) 25-500 MG TABS Take 2 tablets by mouth at bedtime as needed (sleep).   ? esomeprazole (NEXIUM) 40 MG capsule Take 40 mg by mouth daily.   ? gabapentin (NEURONTIN) 100 MG capsule Take 1 capsule (100 mg total) by mouth 3 (three) times daily.  ? losartan (COZAAR) 100 MG tablet Take 100 mg by mouth daily.  ? polycarbophil (FIBERCON) 625 MG tablet Take 625 mg by mouth daily.  ? PROAIR HFA 108 (90 BASE) MCG/ACT inhaler Inhale 2 puffs into the lungs as needed.   ? promethazine (PHENERGAN) 12.5 MG tablet Take 12.5 mg by mouth every 6 (six) hours as needed.  ? simvastatin (ZOCOR) 40 MG tablet Take 40 mg by mouth every evening.  ? solifenacin (VESICARE) 10  MG tablet Take 10 mg by mouth daily.  ? traMADol (ULTRAM) 50 MG tablet Take 50 mg by mouth every 6 (six) hours as needed for moderate pain or severe pain (pain).  ? ?No facility-administered encounter medications on file as of 09/08/2021.  ? ? ?Past Medical History:  ?Diagnosis Date  ? Allergy   ? seasonal allergies  ? Arthritis   ? knee and hands  ? Breast cancer, left breast ([facility]) 12/07/2011  ? Breast cancer, left breast ([facility]) 11/24/11  ? left breast 4 o'clock bx=invasive ductal ca,grade II,ER/PR=+  ? Diverticulitis   ? Full dentures   ? GERD (gastroesophageal reflux disease)   ? HOH (hard of hearing)   ? right ear   ? Hx of radiation therapy 03/14/12- 05/02/12  ? left breast 5040 gray in 28 fx, site of presentation lower outer quadrant boosted to 6440 gray  ? Hyperlipemia   ? Hypertension   ? Left knee DJD   ? PONV (postoperative nausea and vomiting)   ? has used scop patch-that helped  ? Reflux   ? Renal insufficiency 11/09/2011  ? Shortness of breath   ? Wears glasses   ? ? ?Past Surgical History:  ?Procedure Laterality Date  ? ABDOMINAL HYSTERECTOMY  93  ? total  ? BLADDER SUSPENSION    ? BREAST  LUMPECTOMY  01/04/2012  ? left,invasive ductcl ca, grade II,lloq, lymphovascular and perineural invaion ,DCIS,  ? CHOLECYSTECTOMY    ? EYE SURGERY    ? both cataracts  ? FRACTURE SURGERY    ? lt foot  ? KNEE SURGERY  2000  ? rt   ? TONSILLECTOMY    ? TOTAL KNEE ARTHROPLASTY Left 12/24/2013  ? Procedure: LEFT TOTAL KNEE ARTHROPLASTY;  Surgeon: Lorn Junes, MD;  Location: [facility];  Service: Orthopedics;  Laterality: Left;  ? TUBAL LIGATION    ? ? ?Family History  ?Problem Relation Age of Onset  ? Breast cancer Sister 32  ? Leukemia Sister   ? Heart disease Father   ? Breast cancer Maternal Aunt 36  ? Lung cancer Cousin   ?     smoker  ? ? ?Social History  ? ?Socioeconomic History  ? Marital status: Widowed  ?  Spouse name: Not on file  ? Number of children: Not on file  ? Years of education: Not on file  ? Highest education  level: Not on file  ?Occupational History  ? Not on file  ?Tobacco Use  ? Smoking status: Former  ?  Packs/day: 0.25  ?  Years: 20.00  ?  Pack years: 5.00  ?  Types: Cigarettes  ?  Quit date: 11/09/1991  ?  Years since quitting: 29.8  ? Smokeless tobacco: Never  ?Vaping Use  ? Vaping Use: Never used  ?Substance and Sexual Activity  ? Alcohol use: Yes  ?  Comment: socially  ? Drug use: No  ? Sexual activity: Not on file  ?Other Topics Concern  ? Not on file  ?Social History Narrative  ? Not on file  ? ?Social Determinants of Health  ? ?Financial Resource Strain: Not on file  ?Food Insecurity: Not on file  ?Transportation Needs: Not on file  ?Physical Activity: Not on file  ?Stress: Not on file  ?Social Connections: Not on file  ?Intimate Partner Violence: Not on file  ? ? ?ROS ?Review of Systems  ?Constitutional: Negative.   ?HENT: Negative.    ?Eyes: Negative.   ?Respiratory: Negative.    ?Cardiovascular: Negative.   ?Gastrointestinal: Negative.   ?Endocrine: Negative.   ?Genitourinary: Negative.   ?Musculoskeletal:  Positive for arthralgias and joint swelling.  ?Skin: Negative.   ?Allergic/Immunologic: Negative.   ?Neurological: Negative.   ?Hematological: Negative.   ?Psychiatric/Behavioral: Negative.    ?All other systems reviewed and are negative. ? ?Objective:  ? ?Today's Vitals: BP 128/66   Pulse 60   Temp 97.8 ?F (36.6 ?C) (Temporal)   Resp 16   Ht '5\' 3"'$  (1.6 m)   SpO2 98%   BMI 29.58 kg/m?  ? ?Physical Exam ?Vitals and nursing note reviewed.  ?Constitutional:   ?   General: She is not in acute distress. ?   Appearance: Normal appearance. She is normal weight. She is not ill-appearing, toxic-appearing or diaphoretic.  ?Cardiovascular:  ?   Rate and Rhythm: Normal rate and regular rhythm.  ?   Heart sounds: Normal heart sounds. No murmur heard. ?  No friction rub. No gallop.  ?Pulmonary:  ?   Effort: Pulmonary effort is normal. No respiratory distress.  ?   Breath sounds: Normal breath sounds. No stridor.  No wheezing, rhonchi or rales.  ?Chest:  ?   Chest wall: No tenderness.  ?Musculoskeletal:     ?   General: Swelling, tenderness and signs of injury present.  ?   Right  lower leg: Edema present.  ?Skin: ?   General: Skin is warm and dry.  ?Neurological:  ?   General: No focal deficit present.  ?   Mental Status: She is alert and oriented to person, place, and time. Mental status is at baseline.  ?Psychiatric:     ?   Mood and Affect: Mood normal.     ?   Behavior: Behavior normal.     ?   Thought Content: Thought content normal.     ?   Judgment: Judgment normal.  ? ? ? ? ? ? ?Assessment & Plan:  ? ?Problem List Items Addressed This Visit   ? ?  ? Cardiovascular and Mediastinum  ? Hypertension - Primary  ? Relevant Orders  ? CBC with Differential/Platelet (Completed)  ? Comprehensive metabolic panel (Completed)  ? Lipid panel (Completed)  ?  ? Genitourinary  ? UTI (urinary tract infection) Klebsiella  ? Relevant Orders  ? Urine Culture (Completed)  ? POCT Urinalysis Dipstick (Completed)  ? ?Other Visit Diagnoses   ? ? Injury of right foot, initial encounter      ? Relevant Orders  ? DG Foot Complete Right (Completed)  ? DG Ankle Complete Right (Completed)  ? Ambulatory referral to Orthopedic Surgery  ? Multiple closed fractures of right foot, initial encounter      ? Relevant Orders  ? Ambulatory referral to Orthopedic Surgery  ? ?  ? ? ?Follow-up: No follow-ups on file.  ? ?PLAN ?Will collect urine culture and treat as indicated ?Xray foot shows multiple fractures across metatarsals. Will refer to ortho ?Advised on symptoms of clot. She will proceed to ED should these arise. ?Patient encouraged to call clinic with any questions, comments, or concerns. ? ? ?Maximiano Coss, NP ?

## 2021-09-16 ENCOUNTER — Other Ambulatory Visit: Payer: Self-pay

## 2021-09-16 DIAGNOSIS — N3000 Acute cystitis without hematuria: Secondary | ICD-10-CM

## 2021-09-16 MED ORDER — SULFAMETHOXAZOLE-TRIMETHOPRIM 800-160 MG PO TABS: 1.0000 | ORAL_TABLET | Freq: Two times a day (BID) | ORAL | 0 refills | Status: DC

## 2021-09-23 DIAGNOSIS — M179 Osteoarthritis of knee, unspecified: Secondary | ICD-10-CM | POA: Diagnosis not present

## 2021-10-01 DIAGNOSIS — S92344D Nondisplaced fracture of fourth metatarsal bone, right foot, subsequent encounter for fracture with routine healing: Secondary | ICD-10-CM | POA: Diagnosis not present

## 2021-10-01 DIAGNOSIS — S92324D Nondisplaced fracture of second metatarsal bone, right foot, subsequent encounter for fracture with routine healing: Secondary | ICD-10-CM | POA: Diagnosis not present

## 2021-10-01 DIAGNOSIS — S92334D Nondisplaced fracture of third metatarsal bone, right foot, subsequent encounter for fracture with routine healing: Secondary | ICD-10-CM | POA: Diagnosis not present

## 2021-10-06 DIAGNOSIS — Z0000 Encounter for general adult medical examination without abnormal findings: Secondary | ICD-10-CM | POA: Diagnosis not present

## 2021-10-06 DIAGNOSIS — R053 Chronic cough: Secondary | ICD-10-CM | POA: Diagnosis not present

## 2021-10-06 DIAGNOSIS — M109 Gout, unspecified: Secondary | ICD-10-CM | POA: Diagnosis not present

## 2021-10-06 DIAGNOSIS — R600 Localized edema: Secondary | ICD-10-CM | POA: Diagnosis not present

## 2021-10-06 DIAGNOSIS — J4520 Mild intermittent asthma, uncomplicated: Secondary | ICD-10-CM | POA: Diagnosis not present

## 2021-10-06 DIAGNOSIS — M810 Age-related osteoporosis without current pathological fracture: Secondary | ICD-10-CM | POA: Diagnosis not present

## 2021-10-06 DIAGNOSIS — N3281 Overactive bladder: Secondary | ICD-10-CM | POA: Diagnosis not present

## 2021-10-06 DIAGNOSIS — E7800 Pure hypercholesterolemia, unspecified: Secondary | ICD-10-CM | POA: Diagnosis not present

## 2021-10-06 DIAGNOSIS — I10 Essential (primary) hypertension: Secondary | ICD-10-CM | POA: Diagnosis not present

## 2021-10-06 DIAGNOSIS — N183 Chronic kidney disease, stage 3 unspecified: Secondary | ICD-10-CM | POA: Diagnosis not present

## 2021-10-06 DIAGNOSIS — R8290 Unspecified abnormal findings in urine: Secondary | ICD-10-CM | POA: Diagnosis not present

## 2021-10-06 DIAGNOSIS — Z853 Personal history of malignant neoplasm of breast: Secondary | ICD-10-CM | POA: Diagnosis not present

## 2021-10-20 DIAGNOSIS — S92344D Nondisplaced fracture of fourth metatarsal bone, right foot, subsequent encounter for fracture with routine healing: Secondary | ICD-10-CM | POA: Diagnosis not present

## 2021-10-20 DIAGNOSIS — S92334D Nondisplaced fracture of third metatarsal bone, right foot, subsequent encounter for fracture with routine healing: Secondary | ICD-10-CM | POA: Diagnosis not present

## 2021-10-20 DIAGNOSIS — S92324D Nondisplaced fracture of second metatarsal bone, right foot, subsequent encounter for fracture with routine healing: Secondary | ICD-10-CM | POA: Diagnosis not present

## 2021-10-26 DIAGNOSIS — H6123 Impacted cerumen, bilateral: Secondary | ICD-10-CM | POA: Diagnosis not present

## 2021-10-26 DIAGNOSIS — H903 Sensorineural hearing loss, bilateral: Secondary | ICD-10-CM | POA: Diagnosis not present

## 2021-11-11 DIAGNOSIS — L89899 Pressure ulcer of other site, unspecified stage: Secondary | ICD-10-CM | POA: Diagnosis not present

## 2021-11-11 DIAGNOSIS — D692 Other nonthrombocytopenic purpura: Secondary | ICD-10-CM | POA: Diagnosis not present

## 2021-11-11 DIAGNOSIS — B351 Tinea unguium: Secondary | ICD-10-CM | POA: Diagnosis not present

## 2021-11-18 DIAGNOSIS — S92325D Nondisplaced fracture of second metatarsal bone, left foot, subsequent encounter for fracture with routine healing: Secondary | ICD-10-CM | POA: Diagnosis not present

## 2021-12-09 ENCOUNTER — Ambulatory Visit: Payer: Medicare Other | Admitting: Registered Nurse

## 2021-12-10 DIAGNOSIS — M25561 Pain in right knee: Secondary | ICD-10-CM | POA: Diagnosis not present

## 2021-12-29 DIAGNOSIS — E7800 Pure hypercholesterolemia, unspecified: Secondary | ICD-10-CM | POA: Diagnosis not present

## 2021-12-29 DIAGNOSIS — K219 Gastro-esophageal reflux disease without esophagitis: Secondary | ICD-10-CM | POA: Diagnosis not present

## 2021-12-29 DIAGNOSIS — I10 Essential (primary) hypertension: Secondary | ICD-10-CM | POA: Diagnosis not present

## 2021-12-29 DIAGNOSIS — M810 Age-related osteoporosis without current pathological fracture: Secondary | ICD-10-CM | POA: Diagnosis not present

## 2021-12-29 DIAGNOSIS — J4520 Mild intermittent asthma, uncomplicated: Secondary | ICD-10-CM | POA: Diagnosis not present

## 2021-12-29 DIAGNOSIS — N183 Chronic kidney disease, stage 3 unspecified: Secondary | ICD-10-CM | POA: Diagnosis not present

## 2022-01-25 DIAGNOSIS — J4520 Mild intermittent asthma, uncomplicated: Secondary | ICD-10-CM | POA: Diagnosis not present

## 2022-01-25 DIAGNOSIS — M810 Age-related osteoporosis without current pathological fracture: Secondary | ICD-10-CM | POA: Diagnosis not present

## 2022-01-25 DIAGNOSIS — K219 Gastro-esophageal reflux disease without esophagitis: Secondary | ICD-10-CM | POA: Diagnosis not present

## 2022-01-25 DIAGNOSIS — N183 Chronic kidney disease, stage 3 unspecified: Secondary | ICD-10-CM | POA: Diagnosis not present

## 2022-01-25 DIAGNOSIS — E7800 Pure hypercholesterolemia, unspecified: Secondary | ICD-10-CM | POA: Diagnosis not present

## 2022-01-25 DIAGNOSIS — I10 Essential (primary) hypertension: Secondary | ICD-10-CM | POA: Diagnosis not present

## 2022-02-03 ENCOUNTER — Inpatient Hospital Stay (HOSPITAL_COMMUNITY)
Admission: EM | Admit: 2022-02-03 | Discharge: 2022-02-06 | DRG: 872 | Disposition: A | Discharge status: Home / Self Care | Payer: Medicare Other | Attending: Internal Medicine | Admitting: Internal Medicine

## 2022-02-03 ENCOUNTER — Other Ambulatory Visit: Payer: Self-pay

## 2022-02-03 ENCOUNTER — Emergency Department (HOSPITAL_COMMUNITY): Payer: Medicare Other

## 2022-02-03 ENCOUNTER — Encounter (HOSPITAL_COMMUNITY): Payer: Self-pay

## 2022-02-03 DIAGNOSIS — N3000 Acute cystitis without hematuria: Secondary | ICD-10-CM | POA: Diagnosis not present

## 2022-02-03 DIAGNOSIS — R7401 Elevation of levels of liver transaminase levels: Secondary | ICD-10-CM | POA: Diagnosis not present

## 2022-02-03 DIAGNOSIS — R531 Weakness: Secondary | ICD-10-CM

## 2022-02-03 DIAGNOSIS — Z853 Personal history of malignant neoplasm of breast: Secondary | ICD-10-CM

## 2022-02-03 DIAGNOSIS — E1122 Type 2 diabetes mellitus with diabetic chronic kidney disease: Secondary | ICD-10-CM | POA: Diagnosis present

## 2022-02-03 DIAGNOSIS — A4159 Other Gram-negative sepsis: Secondary | ICD-10-CM | POA: Diagnosis not present

## 2022-02-03 DIAGNOSIS — Z806 Family history of leukemia: Secondary | ICD-10-CM | POA: Diagnosis not present

## 2022-02-03 DIAGNOSIS — R1111 Vomiting without nausea: Secondary | ICD-10-CM | POA: Diagnosis not present

## 2022-02-03 DIAGNOSIS — Z87891 Personal history of nicotine dependence: Secondary | ICD-10-CM

## 2022-02-03 DIAGNOSIS — Z801 Family history of malignant neoplasm of trachea, bronchus and lung: Secondary | ICD-10-CM | POA: Diagnosis not present

## 2022-02-03 DIAGNOSIS — R0602 Shortness of breath: Secondary | ICD-10-CM | POA: Diagnosis not present

## 2022-02-03 DIAGNOSIS — R404 Transient alteration of awareness: Secondary | ICD-10-CM | POA: Diagnosis not present

## 2022-02-03 DIAGNOSIS — Z803 Family history of malignant neoplasm of breast: Secondary | ICD-10-CM | POA: Diagnosis not present

## 2022-02-03 DIAGNOSIS — E872 Acidosis, unspecified: Secondary | ICD-10-CM | POA: Diagnosis present

## 2022-02-03 DIAGNOSIS — R7989 Other specified abnormal findings of blood chemistry: Secondary | ICD-10-CM | POA: Diagnosis not present

## 2022-02-03 DIAGNOSIS — E1165 Type 2 diabetes mellitus with hyperglycemia: Secondary | ICD-10-CM | POA: Diagnosis present

## 2022-02-03 DIAGNOSIS — R42 Dizziness and giddiness: Secondary | ICD-10-CM | POA: Diagnosis not present

## 2022-02-03 DIAGNOSIS — Z79899 Other long term (current) drug therapy: Secondary | ICD-10-CM

## 2022-02-03 DIAGNOSIS — Z8249 Family history of ischemic heart disease and other diseases of the circulatory system: Secondary | ICD-10-CM

## 2022-02-03 DIAGNOSIS — M7989 Other specified soft tissue disorders: Secondary | ICD-10-CM | POA: Diagnosis not present

## 2022-02-03 DIAGNOSIS — K5790 Diverticulosis of intestine, part unspecified, without perforation or abscess without bleeding: Secondary | ICD-10-CM | POA: Diagnosis not present

## 2022-02-03 DIAGNOSIS — Z792 Long term (current) use of antibiotics: Secondary | ICD-10-CM | POA: Diagnosis not present

## 2022-02-03 DIAGNOSIS — N1832 Chronic kidney disease, stage 3b: Secondary | ICD-10-CM | POA: Diagnosis not present

## 2022-02-03 DIAGNOSIS — Z886 Allergy status to analgesic agent status: Secondary | ICD-10-CM | POA: Diagnosis not present

## 2022-02-03 DIAGNOSIS — Z7983 Long term (current) use of bisphosphonates: Secondary | ICD-10-CM | POA: Diagnosis not present

## 2022-02-03 DIAGNOSIS — Z888 Allergy status to other drugs, medicaments and biological substances status: Secondary | ICD-10-CM | POA: Diagnosis not present

## 2022-02-03 DIAGNOSIS — E876 Hypokalemia: Secondary | ICD-10-CM | POA: Diagnosis not present

## 2022-02-03 DIAGNOSIS — T50915A Adverse effect of multiple unspecified drugs, medicaments and biological substances, initial encounter: Secondary | ICD-10-CM | POA: Diagnosis not present

## 2022-02-03 DIAGNOSIS — Z1152 Encounter for screening for COVID-19: Secondary | ICD-10-CM | POA: Diagnosis not present

## 2022-02-03 DIAGNOSIS — I499 Cardiac arrhythmia, unspecified: Secondary | ICD-10-CM | POA: Diagnosis not present

## 2022-02-03 DIAGNOSIS — Z885 Allergy status to narcotic agent status: Secondary | ICD-10-CM

## 2022-02-03 DIAGNOSIS — N390 Urinary tract infection, site not specified: Secondary | ICD-10-CM | POA: Diagnosis present

## 2022-02-03 DIAGNOSIS — Z743 Need for continuous supervision: Secondary | ICD-10-CM | POA: Diagnosis not present

## 2022-02-03 DIAGNOSIS — Z96652 Presence of left artificial knee joint: Secondary | ICD-10-CM | POA: Diagnosis not present

## 2022-02-03 LAB — CBC WITH DIFFERENTIAL/PLATELET
Abs Immature Granulocytes: 0.13 10*3/uL — ABNORMAL HIGH (ref 0.00–0.07)
Basophils Absolute: 0 10*3/uL (ref 0.0–0.1)
Basophils Relative: 0 %
Eosinophils Absolute: 0 10*3/uL (ref 0.0–0.5)
Eosinophils Relative: 0 %
HCT: 34.3 % — ABNORMAL LOW (ref 36.0–46.0)
Hemoglobin: 10.8 g/dL — ABNORMAL LOW (ref 12.0–15.0)
Immature Granulocytes: 1 %
Lymphocytes Relative: 2 %
Lymphs Abs: 0.6 10*3/uL — ABNORMAL LOW (ref 0.7–4.0)
MCH: 30.6 pg (ref 26.0–34.0)
MCHC: 31.5 g/dL (ref 30.0–36.0)
MCV: 97.2 fL (ref 80.0–100.0)
Monocytes Absolute: 0.8 10*3/uL (ref 0.1–1.0)
Monocytes Relative: 3 %
Neutro Abs: 23 10*3/uL — ABNORMAL HIGH (ref 1.7–7.7)
Neutrophils Relative %: 94 %
Platelets: 278 10*3/uL (ref 150–400)
RBC: 3.53 MIL/uL — ABNORMAL LOW (ref 3.87–5.11)
RDW: 14.4 % (ref 11.5–15.5)
WBC: 24.5 10*3/uL — ABNORMAL HIGH (ref 4.0–10.5)
nRBC: 0 % (ref 0.0–0.2)

## 2022-02-03 LAB — BASIC METABOLIC PANEL
Anion gap: 7 (ref 5–15)
BUN: 23 mg/dL (ref 8–23)
CO2: 19 mmol/L — ABNORMAL LOW (ref 22–32)
Calcium: 7.6 mg/dL — ABNORMAL LOW (ref 8.9–10.3)
Chloride: 115 mmol/L — ABNORMAL HIGH (ref 98–111)
Creatinine, Ser: 1.36 mg/dL — ABNORMAL HIGH (ref 0.44–1.00)
GFR, Estimated: 38 mL/min — ABNORMAL LOW (ref >60)
Glucose, Bld: 160 mg/dL — ABNORMAL HIGH (ref 70–99)
Potassium: 3.4 mmol/L — ABNORMAL LOW (ref 3.5–5.1)
Sodium: 141 mmol/L (ref 135–145)

## 2022-02-03 LAB — COMPREHENSIVE METABOLIC PANEL
ALT: 271 U/L — ABNORMAL HIGH (ref 0–44)
AST: 854 U/L — ABNORMAL HIGH (ref 15–41)
Albumin: 3.6 g/dL (ref 3.5–5.0)
Alkaline Phosphatase: 328 U/L — ABNORMAL HIGH (ref 38–126)
Anion gap: 11 (ref 5–15)
BUN: 28 mg/dL — ABNORMAL HIGH (ref 8–23)
CO2: 22 mmol/L (ref 22–32)
Calcium: 9.4 mg/dL (ref 8.9–10.3)
Chloride: 108 mmol/L (ref 98–111)
Creatinine, Ser: 1.7 mg/dL — ABNORMAL HIGH (ref 0.44–1.00)
GFR, Estimated: 29 mL/min — ABNORMAL LOW (ref >60)
Glucose, Bld: 139 mg/dL — ABNORMAL HIGH (ref 70–99)
Potassium: 3.8 mmol/L (ref 3.5–5.1)
Sodium: 141 mmol/L (ref 135–145)
Total Bilirubin: 1.1 mg/dL (ref 0.3–1.2)
Total Protein: 6.7 g/dL (ref 6.5–8.1)

## 2022-02-03 LAB — URINALYSIS, ROUTINE W REFLEX MICROSCOPIC
Bilirubin Urine: NEGATIVE
Glucose, UA: NEGATIVE mg/dL
Hgb urine dipstick: NEGATIVE
Ketones, ur: NEGATIVE mg/dL
Nitrite: NEGATIVE
Protein, ur: 100 mg/dL — AB
Specific Gravity, Urine: 1.015 (ref 1.005–1.030)
WBC, UA: >50 WBC/hpf — ABNORMAL HIGH (ref 0–5)
pH: 6 (ref 5.0–8.0)

## 2022-02-03 LAB — LIPASE, BLOOD: Lipase: 392 U/L — ABNORMAL HIGH (ref 11–51)

## 2022-02-03 LAB — RESP PANEL BY RT-PCR (FLU A&B, COVID) ARPGX2
Influenza A by PCR: NEGATIVE
Influenza B by PCR: NEGATIVE
SARS Coronavirus 2 by RT PCR: NEGATIVE

## 2022-02-03 LAB — D-DIMER, QUANTITATIVE: D-Dimer, Quant: 2.62 ug/mL-FEU — ABNORMAL HIGH (ref 0.00–0.50)

## 2022-02-03 LAB — CBG MONITORING, ED: Glucose-Capillary: 128 mg/dL — ABNORMAL HIGH (ref 70–99)

## 2022-02-03 MED ORDER — IOHEXOL 350 MG/ML SOLN
60.0000 mL | Freq: Once | INTRAVENOUS | Status: AC | PRN
  Administered 2022-02-03: 60 mL via INTRAVENOUS

## 2022-02-03 MED ORDER — SODIUM CHLORIDE 0.9 % IV BOLUS
500.0000 mL | Freq: Once | INTRAVENOUS | Status: AC
  Administered 2022-02-03: 500 mL via INTRAVENOUS

## 2022-02-03 NOTE — ED Triage Notes (Signed)
Pt bib ems from home; lives with family; at [facility] walking around, started feeling nauseated, dizzy, weak; went to bathroom and vomited; pt went home, called 911; VSS with ems; 155/64, pt orthostatic, 284 systolic on standing; pt given 150 cc ns pta; cbg 144, 94 hr, 95% RA; a and o x 4, NAD at present; pt continues to endorse weakness; nausea resolved; ambulatory with ems

## 2022-02-03 NOTE — ED Provider Notes (Signed)
[facility]PART[facility] Provider Note   CSN: [id number] Arrival date & time: 02/03/22  1630     History No chief complaint on file.   Melissa Morton is a 88 y.o. female with a past medical history of CKD, breast cancer in remission and recurrent cystitis presenting today due to lightheadedness, dizziness and weakness.  She reports that around 1 she was walking into [employer] and started to feel lightheaded, dizzy and weak.  She then went to the bathroom and had 1 episode of emesis.  Reports some shortness of breath but says that this is baseline and that she has to use her inhaler when this happens.  Also says she has had a cough for about a week that is being treated with benzonatate by PCP.  Denies any chest pain.  No abdominal pain, current nausea or diarrhea.  Says that she feels baseline at this time.    HPI     Home Medications Prior to Admission medications   Medication Sig Start Date End Date Taking? Authorizing Provider  acetaminophen (TYLENOL) 325 MG tablet Take 650 mg by mouth every 6 (six) hours as needed for moderate pain (pain).     [provider]  alendronate (FOSAMAX) 70 MG tablet Take 70 mg by mouth once a week. On Fridays. Take with a full glass of water on an empty stomach.    [provider]  allopurinol (ZYLOPRIM) 300 MG tablet Take 300 mg by mouth daily.    [provider]  colchicine 0.6 MG tablet Take 0.6 mg by mouth as needed (for gout flare).     [provider]  diphenhydramine-acetaminophen (TYLENOL PM) 25-500 MG TABS Take 2 tablets by mouth at bedtime as needed (sleep).     [provider]  esomeprazole (NEXIUM) 40 MG capsule Take 40 mg by mouth daily.  10/01/14   [provider]  gabapentin (NEURONTIN) 100 MG capsule Take 1 capsule (100 mg total) by mouth 3 (three) times daily. 01/29/20   Tasia Catchings, Amy V, PA-C  losartan (COZAAR) 100 MG tablet Take 100 mg by mouth daily.    [provider]  polycarbophil (FIBERCON) 625 MG tablet Take 625 mg by mouth daily.    [provider]  PROAIR HFA 108 (90 BASE) MCG/ACT inhaler Inhale 2 puffs into the lungs as needed.  02/04/15   [provider]  promethazine (PHENERGAN) 12.5 MG tablet Take 12.5 mg by mouth every 6 (six) hours as needed. 06/18/19   [provider]  simvastatin (ZOCOR) 40 MG tablet Take 40 mg by mouth every evening.    [provider]  solifenacin (VESICARE) 10 MG tablet Take 10 mg by mouth daily.    [provider]  sulfamethoxazole-trimethoprim (BACTRIM DS) 800-160 MG tablet Take 1 tablet by mouth 2 (two) times daily. 09/16/21   Maximiano Coss, NP  traMADol (ULTRAM) 50 MG tablet Take 50 mg by mouth every 6 (six) hours as needed for moderate pain or severe pain (pain).    [provider]      Allergies    Hydromorphone hcl, Nsaids, and Prednisone    Review of Systems   Review of Systems  Constitutional:  Negative for fever.  Eyes:  Negative for photophobia and visual disturbance.  Respiratory:  Positive for cough and shortness of breath.   Cardiovascular:  Positive for leg swelling. Negative for chest pain and palpitations.  Gastrointestinal:  Positive for nausea and vomiting. Negative for diarrhea.  Genitourinary:  Negative for dysuria and hematuria.  Musculoskeletal:  Negative for myalgias.  Neurological:  Positive for dizziness, weakness and light-headedness.  Psychiatric/Behavioral:  Negative for confusion.     Physical Exam Updated Vital Signs There were no vitals taken for this visit. Physical Exam Vitals and nursing note reviewed.  Constitutional:      General: She is not in acute distress.    Appearance: Normal appearance. She is not ill-appearing.  HENT:     Head: Normocephalic and atraumatic.  Eyes:     General: No scleral icterus.    Extraocular Movements: Extraocular movements intact.     Conjunctiva/sclera: Conjunctivae normal.      Pupils: Pupils are equal, round, and reactive to light.  Cardiovascular:     Rate and Rhythm: Normal rate and regular rhythm.  Pulmonary:     Effort: Pulmonary effort is normal. No respiratory distress.     Breath sounds: No wheezing.  Abdominal:     General: Abdomen is flat.     Palpations: Abdomen is soft.     Tenderness: There is no abdominal tenderness.  Musculoskeletal:     Cervical back: Normal range of motion.     Right lower leg: Edema present.     Left lower leg: Edema present.     Comments: 2+ pitting edema to bilateral lower extremities.  Right lower extremity slightly more edematous than left  Skin:    General: Skin is warm and dry.     Findings: No rash.  Neurological:     Mental Status: She is alert and oriented to person, place, and time.     Cranial Nerves: Cranial nerve deficit present.     Motor: No weakness.     Coordination: Coordination normal.     Comments: Cranial nerves II through XII grossly intact.  No problem with finger-nose.  PERRLA, EOMs intact.  No nystagmus or reported diplopia.  Normal strength in bilateral upper and lower extremities.    Psychiatric:        Mood and Affect: Mood normal.     ED Results / Procedures / Treatments   Labs (all labs ordered are listed, but only abnormal results are displayed) Labs Reviewed - No data to display  EKG None  Radiology CT ABDOMEN PELVIS W CONTRAST  Result Date: 02/03/2022 CLINICAL DATA:  Dizziness and weakness EXAM: CT ANGIOGRAPHY CHEST CT ABDOMEN AND PELVIS WITH CONTRAST TECHNIQUE: Multidetector CT imaging of the chest was performed using the standard protocol during bolus administration of intravenous contrast. Multiplanar CT image reconstructions and MIPs were obtained to evaluate the vascular anatomy. Multidetector CT imaging of the abdomen and pelvis was performed using the standard protocol during bolus administration of intravenous contrast. RADIATION DOSE REDUCTION: This exam was performed  according to the departmental dose-optimization program which includes automated exposure control, adjustment of the mA and/or kV according to patient size and/or use of iterative reconstruction technique. CONTRAST:  18m OMNIPAQUE IOHEXOL 350 MG/ML SOLN COMPARISON:  Chest x-ray from earlier in the same day. FINDINGS: CTA CHEST FINDINGS Cardiovascular: Thoracic aorta demonstrates atherosclerotic calcifications. No aneurysmal dilatation or dissection is seen. The coronary arteries demonstrate scattered atherosclerotic calcifications. Pulmonary artery shows a normal branching pattern bilaterally. No filling defect to suggest pulmonary embolism is seen. Mediastinum/Nodes: Thoracic inlet is within normal limits. No hilar or mediastinal adenopathy is noted. The esophagus is fluid-filled which may be related to reflux. Small sliding-type hiatal hernia is noted. Lungs/Pleura: Lungs are well aerated bilaterally. Patchy ground-glass airspace opacities are  noted consistent with multifocal infiltrate. No effusion is seen. No sizable parenchymal nodule is noted. Musculoskeletal: Degenerative changes of the thoracic spine are seen. No acute rib abnormality is noted. Review of the MIP images confirms the above findings. CT ABDOMEN and PELVIS FINDINGS Hepatobiliary: No focal liver abnormality is seen. Status post cholecystectomy. No biliary dilatation. Pancreas: Unremarkable. No pancreatic ductal dilatation or surrounding inflammatory changes. Spleen: Normal in size without focal abnormality. Adrenals/Urinary Tract: Adrenal glands show stable left adrenal nodule measuring 1.3 cm. Given the long-term stability this is felt to represent an adenoma and further follow-up is recommended. Right adrenal gland is within normal limits. Kidneys demonstrate a normal enhancement pattern bilaterally. No renal calculi or obstructive changes are seen. The bladder is partially distended. Stomach/Bowel: Mild diverticular change of the colon is  noted. No diverticulitis is seen. No obstructive changes are seen. The appendix is within normal limits. Small bowel and stomach are within normal limits. Vascular/Lymphatic: Aortic atherosclerosis. No enlarged abdominal or pelvic lymph nodes. Reproductive: Status post hysterectomy. No adnexal masses. Other: Small fat containing umbilical hernia is noted. Small fat containing supraumbilical hernia is noted as well. No free fluid is noted. Musculoskeletal: Degenerative changes of lumbar spine are noted. No compression deformity is seen. Review of the MIP images confirms the above findings. IMPRESSION: CTA of the chest: No evidence of pulmonary emboli. Patchy airspace opacities bilaterally consistent with multifocal infiltrate. CT of the abdomen and pelvis: Stable left adrenal nodule from 2018 most consistent with an adenoma. No further follow-up is recommended. Mild diverticular changes noted without diverticulitis. Small umbilical and supraumbilical hernias are noted. Electronically Signed   By: Inez Catalina M.D.   On: 02/03/2022 23:25   CT Angio Chest PE W and/or Wo Contrast  Result Date: 02/03/2022 CLINICAL DATA:  Dizziness and weakness EXAM: CT ANGIOGRAPHY CHEST CT ABDOMEN AND PELVIS WITH CONTRAST TECHNIQUE: Multidetector CT imaging of the chest was performed using the standard protocol during bolus administration of intravenous contrast. Multiplanar CT image reconstructions and MIPs were obtained to evaluate the vascular anatomy. Multidetector CT imaging of the abdomen and pelvis was performed using the standard protocol during bolus administration of intravenous contrast. RADIATION DOSE REDUCTION: This exam was performed according to the departmental dose-optimization program which includes automated exposure control, adjustment of the mA and/or kV according to patient size and/or use of iterative reconstruction technique. CONTRAST:  24m OMNIPAQUE IOHEXOL 350 MG/ML SOLN COMPARISON:  Chest x-ray from  earlier in the same day. FINDINGS: CTA CHEST FINDINGS Cardiovascular: Thoracic aorta demonstrates atherosclerotic calcifications. No aneurysmal dilatation or dissection is seen. The coronary arteries demonstrate scattered atherosclerotic calcifications. Pulmonary artery shows a normal branching pattern bilaterally. No filling defect to suggest pulmonary embolism is seen. Mediastinum/Nodes: Thoracic inlet is within normal limits. No hilar or mediastinal adenopathy is noted. The esophagus is fluid-filled which may be related to reflux. Small sliding-type hiatal hernia is noted. Lungs/Pleura: Lungs are well aerated bilaterally. Patchy ground-glass airspace opacities are noted consistent with multifocal infiltrate. No effusion is seen. No sizable parenchymal nodule is noted. Musculoskeletal: Degenerative changes of the thoracic spine are seen. No acute rib abnormality is noted. Review of the MIP images confirms the above findings. CT ABDOMEN and PELVIS FINDINGS Hepatobiliary: No focal liver abnormality is seen. Status post cholecystectomy. No biliary dilatation. Pancreas: Unremarkable. No pancreatic ductal dilatation or surrounding inflammatory changes. Spleen: Normal in size without focal abnormality. Adrenals/Urinary Tract: Adrenal glands show stable left adrenal nodule measuring 1.3 cm. Given the long-term stability this is felt  to represent an adenoma and further follow-up is recommended. Right adrenal gland is within normal limits. Kidneys demonstrate a normal enhancement pattern bilaterally. No renal calculi or obstructive changes are seen. The bladder is partially distended. Stomach/Bowel: Mild diverticular change of the colon is noted. No diverticulitis is seen. No obstructive changes are seen. The appendix is within normal limits. Small bowel and stomach are within normal limits. Vascular/Lymphatic: Aortic atherosclerosis. No enlarged abdominal or pelvic lymph nodes. Reproductive: Status post hysterectomy. No  adnexal masses. Other: Small fat containing umbilical hernia is noted. Small fat containing supraumbilical hernia is noted as well. No free fluid is noted. Musculoskeletal: Degenerative changes of lumbar spine are noted. No compression deformity is seen. Review of the MIP images confirms the above findings. IMPRESSION: CTA of the chest: No evidence of pulmonary emboli. Patchy airspace opacities bilaterally consistent with multifocal infiltrate. CT of the abdomen and pelvis: Stable left adrenal nodule from 2018 most consistent with an adenoma. No further follow-up is recommended. Mild diverticular changes noted without diverticulitis. Small umbilical and supraumbilical hernias are noted. Electronically Signed   By: Inez Catalina M.D.   On: 02/03/2022 23:25   DG Chest 2 View  Result Date: 02/03/2022 CLINICAL DATA:  Shortness of breath EXAM: CHEST - 2 VIEW COMPARISON:  09/02/2021 FINDINGS: Transverse diameter of heart is increased. There are no signs of pulmonary edema or focal pulmonary consolidation. In the PA view, there is subtle increased density in left lower lung fields which could not be localized in the lateral projection. This may be due to chest wall attenuation. Dense calcification is seen in the region of mitral annulus. IMPRESSION: There are no signs of pulmonary edema or focal pulmonary consolidation. Electronically Signed   By: Elmer Picker M.D.   On: 02/03/2022 17:36    Procedures Procedures   Medications Ordered in ED Medications  sodium chloride 0.9 % bolus 500 mL (0 mLs Intravenous Stopped 02/03/22 1842)  sodium chloride 0.9 % bolus 500 mL (0 mLs Intravenous Stopped 02/03/22 2017)  iohexol (OMNIPAQUE) 350 MG/ML injection 60 mL (60 mLs Intravenous Contrast Given 02/03/22 2253)    ED Course/ Medical Decision Making/ A&P Clinical Course as of 02/03/22 2357  Wed Feb 03, 2022  1730 Per daughter, out of breath after going to bathroom in [employer], did albuterol inhaler x2 but didn't  work and she kept getting weaker.  Then she started hyperventilating on toilet and then got up and threw up a little in sink. Barely could move, so weak. Continued to complain of nausea and EMS came. [MR]  1740 WBC(!): 24.5 [MR]  1802 Lipase(!): 392 [MR]  1803 Alkaline Phosphatase(!): 328 [MR]  1803 ALT(!): 271 [MR]  1803 AST(!): 854 [MR]  1855 D-Dimer, Quant(!): 2.62 [MR]  2128 WBC, UA(!): >50 [MR]  2129 Bacteria, UA(!): MANY [MR]  2129 Chalmers Guest): MODERATE [MR]    Clinical Course User Index [MR] Victory Dresden, Cecilio Asper, PA-C                           Medical Decision Making Amount and/or Complexity of Data Reviewed Labs: ordered. Decision-making details documented in ED Course. Radiology: ordered. ECG/medicine tests: ordered.  Risk Prescription drug management. Decision regarding hospitalization.   This is a 88 year old female presenting today due to lightheadedness, weakness and dizziness.  Differential includes but is not limited to arrhythmia, dehydration, electrolyte abnormality, CVA, PE and viral illness   This is not an exhaustive differential.    Past  Medical History / Co-morbidities / Social History: Patient was diagnosed with breast cancer in 2013 but has had no recurrence   Additional history: Per chart review patient is not being treated with any diuretics.  Has no echocardiogram in the system.  Most recent creatinine of 1.5  Physical Exam: Pertinent physical exam findings include Edematous bilateral lower extremities, right slightly more edematous than left Normal lung sounds  Lab Tests: I ordered, and personally interpreted labs.  The pertinent results include: D-dimer 2.62, elevated with age adjustment.  CT ordered Leukocytosis 24.5 ALT 271, AST 854, alk phos 328, lipase 392   Imaging Studies: I ordered, viewed and interpreted patient's chest x-ray which was negative.  I agree with the radiologist.  After patient's liver enzymes came back  elevated CT scan was ordered.  D-dimer was also elevated so CT PE study added.   Cardiac Monitoring:  The patient was maintained on a cardiac monitor.  Remained in normal sinus   Medications: I ordered IV fluids.  Patient denied any nausea or pain or any other medications.    MDM/Disposition: This is a 88 year old female who presented today due to lightheadedness, dizziness and emesis.  She reports that she was walking into [employer] and all of a sudden felt weak.  Work-up today is remarkable for bacteriuria and elevated LFTs.  Was ordered due to patient's recent travel, dyspnea and cough.  This was elevated so CT PE scan was ordered.  This was negative for acute pulmonary embolus.  After elevated LFTs observed patient had a CT abdomen pelvis.  This was largely unremarkable.  At this time patient will be admitted for weakness and lightheadedness in the setting of a UTI.  Also may need GI evaluation for her elevated LFTs.  She is agreeable to this      I discussed this case with my attending physician Dr. Truett Mainland who cosigned this note including patient's presenting symptoms, physical exam, and planned diagnostics and interventions. Attending physician stated agreement with plan or made changes to plan which were implemented.      Final Clinical Impression(s) / ED Diagnoses Final diagnoses:  Acute cystitis without hematuria  Weakness    Rx / DC Orders Admit to Dr. Clint Lipps, Boyd, PA-C 02/03/22 2359    Cristie Hem, MD 02/04/22 [phone]

## 2022-02-03 NOTE — ED Notes (Signed)
Patient transported to X-ray 

## 2022-02-04 ENCOUNTER — Inpatient Hospital Stay (HOSPITAL_COMMUNITY): Payer: Medicare Other

## 2022-02-04 ENCOUNTER — Encounter (HOSPITAL_COMMUNITY): Payer: Self-pay | Admitting: Internal Medicine

## 2022-02-04 DIAGNOSIS — N3000 Acute cystitis without hematuria: Secondary | ICD-10-CM

## 2022-02-04 DIAGNOSIS — M7989 Other specified soft tissue disorders: Secondary | ICD-10-CM

## 2022-02-04 LAB — CBC WITH DIFFERENTIAL/PLATELET
Abs Immature Granulocytes: 0.11 10*3/uL — ABNORMAL HIGH (ref 0.00–0.07)
Basophils Absolute: 0 10*3/uL (ref 0.0–0.1)
Basophils Relative: 0 %
Eosinophils Absolute: 0 10*3/uL (ref 0.0–0.5)
Eosinophils Relative: 0 %
HCT: 30.8 % — ABNORMAL LOW (ref 36.0–46.0)
Hemoglobin: 9.5 g/dL — ABNORMAL LOW (ref 12.0–15.0)
Immature Granulocytes: 1 %
Lymphocytes Relative: 4 %
Lymphs Abs: 0.7 10*3/uL (ref 0.7–4.0)
MCH: 30.4 pg (ref 26.0–34.0)
MCHC: 30.8 g/dL (ref 30.0–36.0)
MCV: 98.4 fL (ref 80.0–100.0)
Monocytes Absolute: 1 10*3/uL (ref 0.1–1.0)
Monocytes Relative: 5 %
Neutro Abs: 19 10*3/uL — ABNORMAL HIGH (ref 1.7–7.7)
Neutrophils Relative %: 90 %
Platelets: 229 10*3/uL (ref 150–400)
RBC: 3.13 MIL/uL — ABNORMAL LOW (ref 3.87–5.11)
RDW: 14.6 % (ref 11.5–15.5)
WBC: 20.8 10*3/uL — ABNORMAL HIGH (ref 4.0–10.5)
nRBC: 0 % (ref 0.0–0.2)

## 2022-02-04 LAB — COMPREHENSIVE METABOLIC PANEL
ALT: 284 U/L — ABNORMAL HIGH (ref 0–44)
AST: 553 U/L — ABNORMAL HIGH (ref 15–41)
Albumin: 2.9 g/dL — ABNORMAL LOW (ref 3.5–5.0)
Alkaline Phosphatase: 275 U/L — ABNORMAL HIGH (ref 38–126)
Anion gap: 8 (ref 5–15)
BUN: 25 mg/dL — ABNORMAL HIGH (ref 8–23)
CO2: 21 mmol/L — ABNORMAL LOW (ref 22–32)
Calcium: 8.1 mg/dL — ABNORMAL LOW (ref 8.9–10.3)
Chloride: 109 mmol/L (ref 98–111)
Creatinine, Ser: 1.31 mg/dL — ABNORMAL HIGH (ref 0.44–1.00)
GFR, Estimated: 40 mL/min — ABNORMAL LOW (ref >60)
Glucose, Bld: 117 mg/dL — ABNORMAL HIGH (ref 70–99)
Potassium: 4 mmol/L (ref 3.5–5.1)
Sodium: 138 mmol/L (ref 135–145)
Total Bilirubin: 1.4 mg/dL — ABNORMAL HIGH (ref 0.3–1.2)
Total Protein: 5.6 g/dL — ABNORMAL LOW (ref 6.5–8.1)

## 2022-02-04 LAB — BRAIN NATRIURETIC PEPTIDE: B Natriuretic Peptide: 277.7 pg/mL — ABNORMAL HIGH (ref 0.0–100.0)

## 2022-02-04 LAB — MAGNESIUM: Magnesium: 1.6 mg/dL — ABNORMAL LOW (ref 1.7–2.4)

## 2022-02-04 LAB — PHOSPHORUS: Phosphorus: 2.8 mg/dL (ref 2.5–4.6)

## 2022-02-04 MED ORDER — MONTELUKAST SODIUM 10 MG PO TABS
10.0000 mg | ORAL_TABLET | Freq: Every day | ORAL | Status: DC
  Administered 2022-02-04 – 2022-02-06 (×3): 10 mg via ORAL
  Filled 2022-02-04 (×3): qty 1

## 2022-02-04 MED ORDER — CALCIUM POLYCARBOPHIL 625 MG PO TABS
625.0000 mg | ORAL_TABLET | Freq: Every day | ORAL | Status: DC
  Administered 2022-02-04 – 2022-02-06 (×3): 625 mg via ORAL
  Filled 2022-02-04 (×3): qty 1

## 2022-02-04 MED ORDER — TRAMADOL HCL 50 MG PO TABS: 50.0000 mg | ORAL_TABLET | Freq: Four times a day (QID) | ORAL | Status: DC | PRN

## 2022-02-04 MED ORDER — IPRATROPIUM-ALBUTEROL 0.5-2.5 (3) MG/3ML IN SOLN
3.0000 mL | Freq: Two times a day (BID) | RESPIRATORY_TRACT | Status: AC
  Administered 2022-02-04 – 2022-02-05 (×3): 3 mL via RESPIRATORY_TRACT
  Filled 2022-02-04 (×4): qty 3

## 2022-02-04 MED ORDER — FLUTICASONE PROPIONATE 50 MCG/ACT NA SUSP
2.0000 | Freq: Every day | NASAL | Status: DC
  Administered 2022-02-05: 2 via NASAL
  Filled 2022-02-04: qty 16

## 2022-02-04 MED ORDER — CAMPHOR-MENTHOL-METHYL SAL 3.1-6-10 % EX PTCH: 1.0000 | MEDICATED_PATCH | Freq: Every day | CUTANEOUS | Status: DC | PRN

## 2022-02-04 MED ORDER — ALBUTEROL SULFATE (2.5 MG/3ML) 0.083% IN NEBU: 2.5000 mg | INHALATION_SOLUTION | RESPIRATORY_TRACT | Status: DC | PRN

## 2022-02-04 MED ORDER — CICLOPIROX 8 % EX SOLN: 1.0000 | Freq: Every day | CUTANEOUS | Status: DC

## 2022-02-04 MED ORDER — ADULT MULTIVITAMIN W/MINERALS CH
1.0000 | ORAL_TABLET | Freq: Every day | ORAL | Status: DC
  Administered 2022-02-04 – 2022-02-06 (×3): 1 via ORAL
  Filled 2022-02-04 (×3): qty 1

## 2022-02-04 MED ORDER — SODIUM CHLORIDE 0.9 % IV SOLN
2.0000 g | INTRAVENOUS | Status: DC
  Administered 2022-02-04 – 2022-02-06 (×3): 2 g via INTRAVENOUS
  Filled 2022-02-04 (×3): qty 12.5

## 2022-02-04 MED ORDER — ENOXAPARIN SODIUM 30 MG/0.3ML IJ SOSY
30.0000 mg | PREFILLED_SYRINGE | INTRAMUSCULAR | Status: DC
  Administered 2022-02-04 – 2022-02-06 (×3): 30 mg via SUBCUTANEOUS
  Filled 2022-02-04 (×3): qty 0.3

## 2022-02-04 MED ORDER — PANTOPRAZOLE SODIUM 40 MG PO TBEC
80.0000 mg | DELAYED_RELEASE_TABLET | Freq: Every day | ORAL | Status: DC
  Administered 2022-02-04 – 2022-02-05 (×2): 80 mg via ORAL
  Filled 2022-02-04 (×2): qty 2

## 2022-02-04 MED ORDER — GABAPENTIN 100 MG PO CAPS
100.0000 mg | ORAL_CAPSULE | Freq: Three times a day (TID) | ORAL | Status: DC
  Administered 2022-02-04 – 2022-02-06 (×6): 100 mg via ORAL
  Filled 2022-02-04 (×6): qty 1

## 2022-02-04 MED ORDER — MAGNESIUM SULFATE 2 GM/50ML IV SOLN
2.0000 g | Freq: Once | INTRAVENOUS | Status: AC
  Administered 2022-02-04: 2 g via INTRAVENOUS
  Filled 2022-02-04: qty 50

## 2022-02-04 MED ORDER — POLYETHYL GLYCOL-PROPYL GLYCOL 0.4-0.3 % OP GEL: Freq: Every day | OPHTHALMIC | Status: DC | PRN

## 2022-02-04 MED ORDER — POLYVINYL ALCOHOL 1.4 % OP SOLN
1.0000 [drp] | OPHTHALMIC | Status: DC | PRN
  Filled 2022-02-04: qty 15

## 2022-02-04 NOTE — H&P (Addendum)
History and Physical  Melissa Morton GDJ:[id number] DOB: [birth year] DOA: 02/03/2022  Referring physician: Adria Devon  PCP: [name], MD  Outpatient Specialists: None Patient coming from: Home.  Chief Complaint:  Generalized weakness  HPI: Melissa Morton is a 88 y.o. female with medical history significant for breast cancer in remission, CKD 3B, recurrent cystitis, post cholecystectomy, asthma, allergic rhinitis, who presented to [facility] ED with complaints of lightheadedness, dizziness and generalized weakness of 2 days duration.  Symptoms became worse on the day of presentation.  Associated with nausea and vomiting x1.  Has had a cough for about a week, was prescribed benzonatate by PCP.  She presented to the ED for further evaluation.    Work-up in the ED revealed no pulmonary embolism on CT angio chest, multifocal infiltrates suggestive of pneumonia, seen on CT scan.  Also remarkable for urine analysis positive for pyuria, elevated liver chemistries, and elevated lipase level.  CT scan findings were unrevealing.  The patient was started on cefepime, TRH, hospitalist service, was asked to admit.  ED Course: Tmax 98.4.  BP 149/67, pulse 90, respiratory rate 22, O2 saturation 97% on room air.  Lab studies remarkable for serum potassium 3.4, serum bicarb 19, glucose 160, creatinine 1.36 with GFR of 38, at baseline.  WBC 24.5, hemoglobin 10.8.  Review of Systems: Review of systems as noted in the HPI. All other systems reviewed and are negative.   Past Medical History:  Diagnosis Date   Allergy    seasonal allergies   Arthritis    knee and hands   Breast cancer, left breast ([place]) 12/07/2011   Breast cancer, left breast ([place]) 11/24/11   left breast 4 o'clock bx=invasive ductal ca,grade II,ER/PR=+   Diverticulitis    Full dentures    GERD (gastroesophageal reflux disease)    HOH (hard of hearing)    right ear    Hx of radiation therapy 03/14/12- 05/02/12   left breast 5040 gray in  28 fx, site of presentation lower outer quadrant boosted to 6440 gray   Hyperlipemia    Hypertension    Left knee DJD    PONV (postoperative nausea and vomiting)    has used scop patch-that helped   Reflux    Renal insufficiency 11/09/2011   Shortness of breath    Wears glasses    Past Surgical History:  Procedure Laterality Date   ABDOMINAL HYSTERECTOMY  89   total   BLADDER SUSPENSION     BREAST LUMPECTOMY  01/04/2012   left,invasive ductcl ca, grade II,lloq, lymphovascular and perineural invaion ,DCIS,   CHOLECYSTECTOMY     EYE SURGERY     both cataracts   FRACTURE SURGERY     lt foot   KNEE SURGERY  2000   rt    TONSILLECTOMY     TOTAL KNEE ARTHROPLASTY Left 12/24/2013   Procedure: LEFT TOTAL KNEE ARTHROPLASTY;  Surgeon: Lorn Junes, MD;  Location: [facility];  Service: Orthopedics;  Laterality: Left;   TUBAL LIGATION      Social History:  reports that she quit smoking about 30 years ago. Her smoking use included cigarettes. She has a 5.00 pack-year smoking history. She has never used smokeless tobacco. She reports current alcohol use. She reports that she does not use drugs.   Allergies  Allergen Reactions   Hydromorphone Hcl Nausea And Vomiting   Nsaids     11/09/11-Avoid due to decreased liver function.   Prednisone Nausea And Vomiting    Family  History  Problem Relation Age of Onset   Breast cancer Sister 23   Leukemia Sister    Heart disease Father    Breast cancer Maternal Aunt 42   Lung cancer Cousin        smoker      Prior to Admission medications   Medication Sig Start Date End Date Taking? Authorizing Provider  acetaminophen (TYLENOL) 325 MG tablet Take 650 mg by mouth every 6 (six) hours as needed for moderate pain (pain).     [provider]  alendronate (FOSAMAX) 70 MG tablet Take 70 mg by mouth once a week. On Fridays. Take with a full glass of water on an empty stomach.    [provider]  allopurinol (ZYLOPRIM) 300 MG tablet Take  300 mg by mouth daily.    [provider]  colchicine 0.6 MG tablet Take 0.6 mg by mouth as needed (for gout flare).     [provider]  diphenhydramine-acetaminophen (TYLENOL PM) 25-500 MG TABS Take 2 tablets by mouth at bedtime as needed (sleep).     [provider]  esomeprazole (NEXIUM) 40 MG capsule Take 40 mg by mouth daily.  10/01/14   [provider]  gabapentin (NEURONTIN) 100 MG capsule Take 1 capsule (100 mg total) by mouth 3 (three) times daily. 01/29/20   Tasia Catchings, Amy V, PA-C  losartan (COZAAR) 100 MG tablet Take 100 mg by mouth daily.    [provider]  polycarbophil (FIBERCON) 625 MG tablet Take 625 mg by mouth daily.    [provider]  PROAIR HFA 108 (90 BASE) MCG/ACT inhaler Inhale 2 puffs into the lungs as needed.  02/04/15   [provider]  promethazine (PHENERGAN) 12.5 MG tablet Take 12.5 mg by mouth every 6 (six) hours as needed. 06/18/19   [provider]  simvastatin (ZOCOR) 40 MG tablet Take 40 mg by mouth every evening.    [provider]  solifenacin (VESICARE) 10 MG tablet Take 10 mg by mouth daily.    [provider]  sulfamethoxazole-trimethoprim (BACTRIM DS) 800-160 MG tablet Take 1 tablet by mouth 2 (two) times daily. 09/16/21   Maximiano Coss, NP  traMADol (ULTRAM) 50 MG tablet Take 50 mg by mouth every 6 (six) hours as needed for moderate pain or severe pain (pain).    [provider]    Physical Exam: BP (!) 151/65   Pulse 91   Temp 97.9 F (36.6 C) (Oral)   Resp (!) 23   Ht '5\' 3"'$  (1.6 m)   Wt 71.2 kg   SpO2 98%   BMI 27.81 kg/m   General: 88 y.o. year-old female well developed well nourished in no acute distress.  Alert and oriented x3.  Very hard of hearing. Cardiovascular: Regular rate and rhythm with no rubs or gallops.  No thyromegaly or JVD noted.  1+ pitting edema in lower extremities bilaterally.   Respiratory: Mild rales bilaterally.  Poor inspiratory  effort. Abdomen: Soft nontender nondistended with normal bowel sounds x4 quadrants. Muskuloskeletal: No cyanosis or clubbing.  1+ pitting edema in lower extremities bilaterally. Neuro: CN II-XII intact, strength, sensation, reflexes Skin: No ulcerative lesions noted or rashes Psychiatry: Judgement and insight appear normal. Mood is appropriate for condition and setting          Labs on Admission:  Basic Metabolic Panel: Recent Labs  Lab 02/03/22 1645 02/03/22 2006  NA 141 141  K 3.8 3.4*  CL 108 115*  CO2 22  19*  GLUCOSE 139* 160*  BUN 28* 23  CREATININE 1.70* 1.36*  CALCIUM 9.4 7.6*   Liver Function Tests: Recent Labs  Lab 02/03/22 1645  AST 854*  ALT 271*  ALKPHOS 328*  BILITOT 1.1  PROT 6.7  ALBUMIN 3.6   Recent Labs  Lab 02/03/22 1645  LIPASE 392*   No results for input(s): "AMMONIA" in the last 168 hours. CBC: Recent Labs  Lab 02/03/22 1645  WBC 24.5*  NEUTROABS 23.0*  HGB 10.8*  HCT 34.3*  MCV 97.2  PLT 278   Cardiac Enzymes: No results for input(s): "CKTOTAL", "CKMB", "CKMBINDEX", "TROPONINI" in the last 168 hours.  BNP (last 3 results) No results for input(s): "BNP" in the last 8760 hours.  ProBNP (last 3 results) No results for input(s): "PROBNP" in the last 8760 hours.  CBG: Recent Labs  Lab 02/03/22 1643  GLUCAP 128*    Radiological Exams on Admission: CT ABDOMEN PELVIS W CONTRAST  Result Date: 02/03/2022 CLINICAL DATA:  Dizziness and weakness EXAM: CT ANGIOGRAPHY CHEST CT ABDOMEN AND PELVIS WITH CONTRAST TECHNIQUE: Multidetector CT imaging of the chest was performed using the standard protocol during bolus administration of intravenous contrast. Multiplanar CT image reconstructions and MIPs were obtained to evaluate the vascular anatomy. Multidetector CT imaging of the abdomen and pelvis was performed using the standard protocol during bolus administration of intravenous contrast. RADIATION DOSE REDUCTION: This exam was performed  according to the departmental dose-optimization program which includes automated exposure control, adjustment of the mA and/or kV according to patient size and/or use of iterative reconstruction technique. CONTRAST:  36m OMNIPAQUE IOHEXOL 350 MG/ML SOLN COMPARISON:  Chest x-ray from earlier in the same day. FINDINGS: CTA CHEST FINDINGS Cardiovascular: Thoracic aorta demonstrates atherosclerotic calcifications. No aneurysmal dilatation or dissection is seen. The coronary arteries demonstrate scattered atherosclerotic calcifications. Pulmonary artery shows a normal branching pattern bilaterally. No filling defect to suggest pulmonary embolism is seen. Mediastinum/Nodes: Thoracic inlet is within normal limits. No hilar or mediastinal adenopathy is noted. The esophagus is fluid-filled which may be related to reflux. Small sliding-type hiatal hernia is noted. Lungs/Pleura: Lungs are well aerated bilaterally. Patchy ground-glass airspace opacities are noted consistent with multifocal infiltrate. No effusion is seen. No sizable parenchymal nodule is noted. Musculoskeletal: Degenerative changes of the thoracic spine are seen. No acute rib abnormality is noted. Review of the MIP images confirms the above findings. CT ABDOMEN and PELVIS FINDINGS Hepatobiliary: No focal liver abnormality is seen. Status post cholecystectomy. No biliary dilatation. Pancreas: Unremarkable. No pancreatic ductal dilatation or surrounding inflammatory changes. Spleen: Normal in size without focal abnormality. Adrenals/Urinary Tract: Adrenal glands show stable left adrenal nodule measuring 1.3 cm. Given the long-term stability this is felt to represent an adenoma and further follow-up is recommended. Right adrenal gland is within normal limits. Kidneys demonstrate a normal enhancement pattern bilaterally. No renal calculi or obstructive changes are seen. The bladder is partially distended. Stomach/Bowel: Mild diverticular change of the colon is  noted. No diverticulitis is seen. No obstructive changes are seen. The appendix is within normal limits. Small bowel and stomach are within normal limits. Vascular/Lymphatic: Aortic atherosclerosis. No enlarged abdominal or pelvic lymph nodes. Reproductive: Status post hysterectomy. No adnexal masses. Other: Small fat containing umbilical hernia is noted. Small fat containing supraumbilical hernia is noted as well. No free fluid is noted. Musculoskeletal: Degenerative changes of lumbar spine are noted. No compression deformity is seen. Review of the MIP images confirms the above findings. IMPRESSION: CTA of the chest: No  evidence of pulmonary emboli. Patchy airspace opacities bilaterally consistent with multifocal infiltrate. CT of the abdomen and pelvis: Stable left adrenal nodule from 2018 most consistent with an adenoma. No further follow-up is recommended. Mild diverticular changes noted without diverticulitis. Small umbilical and supraumbilical hernias are noted. Electronically Signed   By: Inez Catalina M.D.   On: 02/03/2022 23:25   CT Angio Chest PE W and/or Wo Contrast  Result Date: 02/03/2022 CLINICAL DATA:  Dizziness and weakness EXAM: CT ANGIOGRAPHY CHEST CT ABDOMEN AND PELVIS WITH CONTRAST TECHNIQUE: Multidetector CT imaging of the chest was performed using the standard protocol during bolus administration of intravenous contrast. Multiplanar CT image reconstructions and MIPs were obtained to evaluate the vascular anatomy. Multidetector CT imaging of the abdomen and pelvis was performed using the standard protocol during bolus administration of intravenous contrast. RADIATION DOSE REDUCTION: This exam was performed according to the departmental dose-optimization program which includes automated exposure control, adjustment of the mA and/or kV according to patient size and/or use of iterative reconstruction technique. CONTRAST:  45m OMNIPAQUE IOHEXOL 350 MG/ML SOLN COMPARISON:  Chest x-ray from  earlier in the same day. FINDINGS: CTA CHEST FINDINGS Cardiovascular: Thoracic aorta demonstrates atherosclerotic calcifications. No aneurysmal dilatation or dissection is seen. The coronary arteries demonstrate scattered atherosclerotic calcifications. Pulmonary artery shows a normal branching pattern bilaterally. No filling defect to suggest pulmonary embolism is seen. Mediastinum/Nodes: Thoracic inlet is within normal limits. No hilar or mediastinal adenopathy is noted. The esophagus is fluid-filled which may be related to reflux. Small sliding-type hiatal hernia is noted. Lungs/Pleura: Lungs are well aerated bilaterally. Patchy ground-glass airspace opacities are noted consistent with multifocal infiltrate. No effusion is seen. No sizable parenchymal nodule is noted. Musculoskeletal: Degenerative changes of the thoracic spine are seen. No acute rib abnormality is noted. Review of the MIP images confirms the above findings. CT ABDOMEN and PELVIS FINDINGS Hepatobiliary: No focal liver abnormality is seen. Status post cholecystectomy. No biliary dilatation. Pancreas: Unremarkable. No pancreatic ductal dilatation or surrounding inflammatory changes. Spleen: Normal in size without focal abnormality. Adrenals/Urinary Tract: Adrenal glands show stable left adrenal nodule measuring 1.3 cm. Given the long-term stability this is felt to represent an adenoma and further follow-up is recommended. Right adrenal gland is within normal limits. Kidneys demonstrate a normal enhancement pattern bilaterally. No renal calculi or obstructive changes are seen. The bladder is partially distended. Stomach/Bowel: Mild diverticular change of the colon is noted. No diverticulitis is seen. No obstructive changes are seen. The appendix is within normal limits. Small bowel and stomach are within normal limits. Vascular/Lymphatic: Aortic atherosclerosis. No enlarged abdominal or pelvic lymph nodes. Reproductive: Status post hysterectomy. No  adnexal masses. Other: Small fat containing umbilical hernia is noted. Small fat containing supraumbilical hernia is noted as well. No free fluid is noted. Musculoskeletal: Degenerative changes of lumbar spine are noted. No compression deformity is seen. Review of the MIP images confirms the above findings. IMPRESSION: CTA of the chest: No evidence of pulmonary emboli. Patchy airspace opacities bilaterally consistent with multifocal infiltrate. CT of the abdomen and pelvis: Stable left adrenal nodule from 2018 most consistent with an adenoma. No further follow-up is recommended. Mild diverticular changes noted without diverticulitis. Small umbilical and supraumbilical hernias are noted. Electronically Signed   By: MInez CatalinaM.D.   On: 02/03/2022 23:25   DG Chest 2 View  Result Date: 02/03/2022 CLINICAL DATA:  Shortness of breath EXAM: CHEST - 2 VIEW COMPARISON:  09/02/2021 FINDINGS: Transverse diameter of heart is increased. There  are no signs of pulmonary edema or focal pulmonary consolidation. In the PA view, there is subtle increased density in left lower lung fields which could not be localized in the lateral projection. This may be due to chest wall attenuation. Dense calcification is seen in the region of mitral annulus. IMPRESSION: There are no signs of pulmonary edema or focal pulmonary consolidation. Electronically Signed   By: Elmer Picker M.D.   On: 02/03/2022 17:36    EKG: I independently viewed the EKG done and my findings are as followed: Sinus rhythm rate of 91.  Nonspecific ST-T changes.  QTc 459.  Assessment/Plan Present on Admission:  UTI (urinary tract infection) Klebsiella  Principal Problem:   UTI (urinary tract infection) Klebsiella  Sepsis secondary to presumptive UTI, POA. UA positive for pyuria, WBC 24.5 K, pulse 105, respiration rate 24. Monitor fever curve and WBCs Maintain MAP greater than 65 Follow blood cultures x2 peripherally and urine culture for ID and  sensitivities. Recent history of E. coli UTI in May 2023, had multiple drug-resistant, was sensitive to cefepime.  Acute transaminitis, unclear etiology Suspect side effect from various home medications Alkaline phosphatase, lipase, AST ALT, elevated Hold off home gabapentin, Vesicare, allopurinol, colchicine, as they can all contribute to elevated liver chemistries. The patient takes 2 tablets of Tylenol nightly, prior to admission, hold off for now. Avoid hepatotoxic agents Repeat chemistry panel in the morning  Non anion gap metabolic acidosis Serum bicarb 19, anion gap 7 Gentle IV fluid hydration Repeat chemistry panel in the morning.  Hypokalemia Serum potassium 3.4 Repleted orally Repeat chemistry panel in the morning Obtain magnesium level  CKD 3B Appears to be at her baseline creatinine 1.36 with GFR 38. Avoid hepatotoxic agents, dehydration and hypotension. Monitor urine output with strict I's and O's. Repeat chemistry panel tomorrow morning  Type 2 diabetes with hyperglycemia Obtain hemoglobin A1c Start insulin sliding scale.  Generalized weakness PT OT to assess Ambulates with a walker and a cane at baseline Fall precautions  Bilateral lower extremity edema with tenderness left greater than right Minimally ambulatory Rule out DVT Bilateral duplex ultrasound of lower extremities    Critical care time: 65 minutes.    DVT prophylaxis: Subcu Lovenox daily  Code Status: Full code  Family Communication: Updated the patient's daughters at bedside  Disposition Plan: Admitted to telemetry medical unit  Consults called: None  Admission status: Inpatient status.   Status is: Inpatient The patient requires at least 2 midnights for further evaluation and treatment of present condition.   Kayleen Memos MD [facility] Pager [phone]  If 7PM-7AM, please contact night-coverage [email or web address] Password [email or web address]  02/04/2022, 12:06 AM

## 2022-02-04 NOTE — Progress Notes (Signed)
Pharmacy Antibiotic Note  Melissa Morton is a 88 y.o. female admitted on 02/03/2022 with UTI.  Pharmacy has been consulted for Cefepime dosing. WBC is elevated. Noted renal dysfunction, somewhat improved (Scr 1.7>>1.36).   Plan: Cefepime 2g IV q24h Trend WBC, temp, renal function  F/U infectious work-up   Height: '5\' 3"'$  (160 cm) Weight: 71.2 kg (157 lb) IBW/kg (Calculated) : 52.4  Temp (24hrs), Avg:98.1 F (36.7 C), Min:97.9 F (36.6 C), Max:98.4 F (36.9 C)  Recent Labs  Lab 02/03/22 1645 02/03/22 2006  WBC 24.5*  --   CREATININE 1.70* 1.36*    Estimated Creatinine Clearance: 28.1 mL/min (A) (by C-G formula based on SCr of 1.36 mg/dL (H)).    Allergies  Allergen Reactions   Hydromorphone Hcl Nausea And Vomiting   Nsaids     11/09/11-Avoid due to decreased liver function.   Prednisone Nausea And Vomiting    Narda Bonds, PharmD, BCPS Clinical Pharmacist Phone: [phone]

## 2022-02-04 NOTE — Progress Notes (Signed)
VASCULAR LAB    Bilateral lower extremity venous duplex has been performed.  See CV proc for preliminary results.   Mouhamad Teed, RVT 02/04/2022, 10:10 AM

## 2022-02-04 NOTE — Progress Notes (Signed)
Patient admitted after midnight, please see H&P.  Here with sepsis from UTI.  Sepsis picture is resolving.  Will add diet for patient.  Continue IV abx and follow culture for de-escalation. Eulogio Bear DO

## 2022-02-05 DIAGNOSIS — R531 Weakness: Secondary | ICD-10-CM

## 2022-02-05 LAB — COMPREHENSIVE METABOLIC PANEL
ALT: 171 U/L — ABNORMAL HIGH (ref 0–44)
AST: 146 U/L — ABNORMAL HIGH (ref 15–41)
Albumin: 2.9 g/dL — ABNORMAL LOW (ref 3.5–5.0)
Alkaline Phosphatase: 230 U/L — ABNORMAL HIGH (ref 38–126)
Anion gap: 8 (ref 5–15)
BUN: 23 mg/dL (ref 8–23)
CO2: 23 mmol/L (ref 22–32)
Calcium: 9.2 mg/dL (ref 8.9–10.3)
Chloride: 108 mmol/L (ref 98–111)
Creatinine, Ser: 1.36 mg/dL — ABNORMAL HIGH (ref 0.44–1.00)
GFR, Estimated: 38 mL/min — ABNORMAL LOW (ref >60)
Glucose, Bld: 106 mg/dL — ABNORMAL HIGH (ref 70–99)
Potassium: 4.2 mmol/L (ref 3.5–5.1)
Sodium: 139 mmol/L (ref 135–145)
Total Bilirubin: 0.8 mg/dL (ref 0.3–1.2)
Total Protein: 5.8 g/dL — ABNORMAL LOW (ref 6.5–8.1)

## 2022-02-05 LAB — CBC
HCT: 29.8 % — ABNORMAL LOW (ref 36.0–46.0)
Hemoglobin: 9.9 g/dL — ABNORMAL LOW (ref 12.0–15.0)
MCH: 30.7 pg (ref 26.0–34.0)
MCHC: 33.2 g/dL (ref 30.0–36.0)
MCV: 92.5 fL (ref 80.0–100.0)
Platelets: 199 10*3/uL (ref 150–400)
RBC: 3.22 MIL/uL — ABNORMAL LOW (ref 3.87–5.11)
RDW: 14.6 % (ref 11.5–15.5)
WBC: 11.5 10*3/uL — ABNORMAL HIGH (ref 4.0–10.5)
nRBC: 0 % (ref 0.0–0.2)

## 2022-02-05 NOTE — TOC Initial Note (Signed)
Transition of Care [facility]) - Initial/Assessment Note    Patient Details  Name: Melissa Morton MRN: [id number] Date of Birth: [birth year]  Transition of [facility]) CM/SW Contact:    Tom-Johnson, Renea Ee, RN Phone Number: 02/05/2022, 11:10 AM  Clinical Narrative:                  CM spoke with patient at bedside about needs for post hospital transition. Admitted for UTI, on IV abx.  From home with daughter and her husband, has four living children. Has a cane, walker, shower seat, w/c and grab bars at home.  PCP is [name], MD [facility] [address].  No OT f/u noted at this time.  Daughter to transport at discharge. No further TOC needs noted.  Expected Discharge Plan: Home/Self Care Barriers to Discharge: Continued Medical Work up   Patient Goals and CMS Choice Patient states their goals for this hospitalization and ongoing recovery are:: To return home CMS Medicare.gov Compare Post Acute Care list provided to:: Patient Choice offered to / list presented to : NA  Expected Discharge Plan and Services Expected Discharge Plan: Home/Self Care   Discharge Planning Services: CM Consult Post Acute Care Choice: NA Living arrangements for the past 2 months: Single Family Home                 DME Arranged: N/A DME Agency: NA       HH Arranged: NA HH Agency: NA        Prior Living Arrangements/Services Living arrangements for the past 2 months: Single Family Home Lives with:: Adult Children (Daughter) Patient language and need for interpreter reviewed:: Yes Do you feel safe going back to the place where you live?: Yes      Need for Family Participation in Patient Care: Yes (Comment) Care giver support system in place?: Yes (comment) Current home services: DME (Cane, walker, w/c, shower seat, grab bars.) Criminal Activity/Legal Involvement Pertinent to Current Situation/Hospitalization: No - Comment as needed  Activities of Daily Living Home  Assistive Devices/Equipment: Eyeglasses, Hearing aid, Walker (specify type) ADL Screening (condition at time of admission) Patient's cognitive ability adequate to safely complete daily activities?: Yes Is the patient deaf or have difficulty hearing?: Yes Does the patient have difficulty seeing, even when wearing glasses/contacts?: Yes Does the patient have difficulty concentrating, remembering, or making decisions?: No Patient able to express need for assistance with ADLs?: Yes Does the patient have difficulty dressing or bathing?: No Independently performs ADLs?: Yes (appropriate for developmental age) Does the patient have difficulty walking or climbing stairs?: Yes Weakness of Legs: Both Weakness of Arms/Hands: None  Permission Sought/Granted Permission sought to share information with : Case Manager, Family Supports Permission granted to share information with : Yes, Verbal Permission Granted              Emotional Assessment Appearance:: Appears stated age Attitude/Demeanor/Rapport: Engaged, Gracious Affect (typically observed): Accepting, Appropriate, Calm, Hopeful, Pleasant Orientation: : Oriented to Self, Oriented to Place, Oriented to  Time, Oriented to Situation Alcohol / Substance Use: Not Applicable Psych Involvement: No (comment)  Admission diagnosis:  UTI (urinary tract infection) [N39.0] Weakness [R53.1] Acute cystitis without hematuria [N30.00] Patient Active Problem List   Diagnosis Date Noted   UTI (urinary tract infection) Klebsiella 12/25/2013   DJD (degenerative joint disease) of knee 12/24/2013   PONV (postoperative nausea and vomiting)    GERD (gastroesophageal reflux disease)    Hypertension    Left  knee DJD    Osteoporosis 03/20/2013   Hx of radiation therapy    Breast cancer of lower-outer quadrant of left female breast ([place]) 12/07/2011   Leucocytosis 11/09/2011   Renal insufficiency 11/09/2011   Gout 11/09/2011   PCP:  [name],  MD Pharmacy:   [facility] [place], [place] [address] [place] [address] [place] [address] Phone: [phone] Fax: [phone]  [id number] [address]Phone: [phone]: [phone] [id number] [place] [address]: [phone]: [phone]   Social Determinants of Health (SDOH) Interventions    Readmission Risk Interventions     No data to display

## 2022-02-05 NOTE — Progress Notes (Signed)
PROGRESS NOTE    Melissa Morton  IPJ:[id number] DOB: [birth year] DOA: 02/03/2022 PCP: [name], MD    Brief Narrative:    Melissa Morton is a 88 y.o. female with medical history significant for breast cancer in remission, CKD 3B, recurrent cystitis, post cholecystectomy, asthma, allergic rhinitis, who presented to [facility] ED with complaints of lightheadedness, dizziness and generalized weakness of 2 days duration. Found to have UTI.  Await culture  Assessment and Plan: Sepsis secondary to presumptive UTI, POA. Culture with gram negative rods-- await culture Recent history of E. coli UTI in May 2023, had multiple drug-resistant, was sensitive to cefepime.   Acute transaminitis, unclear etiology Suspect side effect from various home medications -trending down   Non anion gap metabolic acidosis -resolved   Hypokalemia Serum potassium 3.4 -repleted   CKD 3B Appears to be at her baseline creatinine 1.36 with GFR 38. -stable   Generalized weakness PT OT - no follow up   Bilateral lower extremity edema with tenderness left greater than right -duplex negative    lipase elevated-- unclear reason -recheck and will need outpatient follow up   DVT prophylaxis: enoxaparin (LOVENOX) injection 30 mg Start: 02/04/22 1000    Code Status: Full Code Family Communication:   Disposition Plan:  Level of care: Med-Surg Status is: Inpatient Remains inpatient appropriate because: needs IV abx    Consultants:  none   Subjective: Feeling well-- going to [place] at the end of the month  Objective: Vitals:   02/04/22 2100 02/05/22 0058 02/05/22 0454 02/05/22 0921  BP:  (!) 141/67 (!) 153/67 138/61  Pulse:  79 65 76  Resp:  '15 19 18  '$ Temp:  98 F (36.7 C) 97.8 F (36.6 C) 98.2 F (36.8 C)  TempSrc:  Oral Oral Oral  SpO2: 97% 98% 97% 96%  Weight:      Height:        Intake/Output Summary (Last 24 hours) at 02/05/2022 1154 Last data filed at 02/05/2022 0900 Gross per 24  hour  Intake 350 ml  Output 0 ml  Net 350 ml   Filed Weights   02/03/22 1637  Weight: 71.2 kg    Examination:   General: Appearance:     Overweight female in no acute distress     Lungs:      respirations unlabored  Heart:    Normal heart rate.    MS:   All extremities are intact.    Neurologic:   Awake, alert       Data Reviewed: I have personally reviewed following labs and imaging studies  CBC: Recent Labs  Lab 02/03/22 1645 02/04/22 0130 02/05/22 0347  WBC 24.5* 20.8* 11.5*  NEUTROABS 23.0* 19.0*  --   HGB 10.8* 9.5* 9.9*  HCT 34.3* 30.8* 29.8*  MCV 97.2 98.4 92.5  PLT 278 229 734   Basic Metabolic Panel: Recent Labs  Lab 02/03/22 1645 02/03/22 2006 02/04/22 0130 02/05/22 0347  NA 141 141 138 139  K 3.8 3.4* 4.0 4.2  CL 108 115* 109 108  CO2 22 19* 21* 23  GLUCOSE 139* 160* 117* 106*  BUN 28* 23 25* 23  CREATININE 1.70* 1.36* 1.31* 1.36*  CALCIUM 9.4 7.6* 8.1* 9.2  MG  --   --  1.6*  --   PHOS  --   --  2.8  --    GFR: Estimated Creatinine Clearance: 28.1 mL/min (A) (by C-G formula based on SCr of 1.36 mg/dL (H)). Liver Function Tests: Recent  Labs  Lab 02/03/22 1645 02/04/22 0130 02/05/22 0347  AST 854* 553* 146*  ALT 271* 284* 171*  ALKPHOS 328* 275* 230*  BILITOT 1.1 1.4* 0.8  PROT 6.7 5.6* 5.8*  ALBUMIN 3.6 2.9* 2.9*   Recent Labs  Lab 02/03/22 1645  LIPASE 392*   No results for input(s): "AMMONIA" in the last 168 hours. Coagulation Profile: No results for input(s): "INR", "PROTIME" in the last 168 hours. Cardiac Enzymes: No results for input(s): "CKTOTAL", "CKMB", "CKMBINDEX", "TROPONINI" in the last 168 hours. BNP (last 3 results) No results for input(s): "PROBNP" in the last 8760 hours. HbA1C: No results for input(s): "HGBA1C" in the last 72 hours. CBG: Recent Labs  Lab 02/03/22 1643  GLUCAP 128*   Lipid Profile: No results for input(s): "CHOL", "HDL", "LDLCALC", "TRIG", "CHOLHDL", "LDLDIRECT" in the last 72  hours. Thyroid Function Tests: No results for input(s): "TSH", "T4TOTAL", "FREET4", "T3FREE", "THYROIDAB" in the last 72 hours. Anemia Panel: No results for input(s): "VITAMINB12", "FOLATE", "FERRITIN", "TIBC", "IRON", "RETICCTPCT" in the last 72 hours. Sepsis Labs: No results for input(s): "PROCALCITON", "LATICACIDVEN" in the last 168 hours.  Recent Results (from the past 240 hour(s))  Resp Panel by RT-PCR (Flu A&B, Covid) Anterior Nasal Swab     Status: None   Collection Time: 02/03/22  4:42 PM   Specimen: Anterior Nasal Swab  Result Value Ref Range Status   SARS Coronavirus 2 by RT PCR NEGATIVE NEGATIVE Final    Comment: (NOTE) SARS-CoV-2 target nucleic acids are NOT DETECTED.  The SARS-CoV-2 RNA is generally detectable in upper respiratory specimens during the acute phase of infection. The lowest concentration of SARS-CoV-2 viral copies this assay can detect is 138 copies/mL. A negative result does not preclude SARS-Cov-2 infection and should not be used as the sole basis for treatment or other patient management decisions. A negative result may occur with  improper specimen collection/handling, submission of specimen other than nasopharyngeal swab, presence of viral mutation(s) within the areas targeted by this assay, and inadequate number of viral copies(<138 copies/mL). A negative result must be combined with clinical observations, patient history, and epidemiological information. The expected result is Negative.  Fact Sheet for Patients:  EntrepreneurPulse.com.au  Fact Sheet for Healthcare Providers:  IncredibleEmployment.be  This test is no t yet approved or cleared by the [place] FDA and  has been authorized for detection and/or diagnosis of SARS-CoV-2 by FDA under an Emergency Use Authorization (EUA). This EUA will remain  in effect (meaning this test can be used) for the duration of the COVID-19 declaration under Section  564(b)(1) of the Act, 21 U.S.C.section 360bbb-3(b)(1), unless the authorization is terminated  or revoked sooner.       Influenza A by PCR NEGATIVE NEGATIVE Final   Influenza B by PCR NEGATIVE NEGATIVE Final    Comment: (NOTE) The Xpert Xpress SARS-CoV-2/FLU/RSV plus assay is intended as an aid in the diagnosis of influenza from Nasopharyngeal swab specimens and should not be used as a sole basis for treatment. Nasal washings and aspirates are unacceptable for Xpert Xpress SARS-CoV-2/FLU/RSV testing.  Fact Sheet for Patients: EntrepreneurPulse.com.au  Fact Sheet for Healthcare Providers: IncredibleEmployment.be  This test is not yet approved or cleared by the [place] FDA and has been authorized for detection and/or diagnosis of SARS-CoV-2 by FDA under an Emergency Use Authorization (EUA). This EUA will remain in effect (meaning this test can be used) for the duration of the COVID-19 declaration under Section 564(b)(1) of the Act, 21 U.S.C. section 360bbb-3(b)(1),  unless the authorization is terminated or revoked.  Performed at [facility], [place] [address] [place], [place] [address]   Urine Culture     Status: Abnormal (Preliminary result)   Collection Time: 02/03/22 11:46 PM   Specimen: Urine, Clean Catch  Result Value Ref Range Status   Specimen Description URINE, CLEAN CATCH  Final   Special Requests   Final    NONE Performed at [facility], [place] [address] [place], [place] [address]    Culture >=100,000 COLONIES/mL GRAM NEGATIVE RODS (A)  Final   Report Status PENDING  Incomplete  Culture, blood (Routine X 2) w Reflex to ID Panel     Status: None (Preliminary result)   Collection Time: 02/04/22  1:34 AM   Specimen: BLOOD  Result Value Ref Range Status   Specimen Description BLOOD SITE NOT SPECIFIED  Final   Special Requests   Final    BOTTLES DRAWN AEROBIC AND ANAEROBIC Blood Culture adequate volume   Culture    Final    NO GROWTH 1 DAY Performed at [facility], [place] [address], [place], [place] [address]    Report Status PENDING  Incomplete  Culture, blood (Routine X 2) w Reflex to ID Panel     Status: None (Preliminary result)   Collection Time: 02/04/22  4:13 AM   Specimen: BLOOD RIGHT HAND  Result Value Ref Range Status   Specimen Description BLOOD RIGHT HAND  Final   Special Requests   Final    BOTTLES DRAWN AEROBIC ONLY Blood Culture results may not be optimal due to an excessive volume of blood received in culture bottles   Culture   Final    NO GROWTH 1 DAY Performed at [facility], [place] [address] [place], [place] [address]    Report Status PENDING  Incomplete         Radiology Studies: VAS [place] LOWER EXTREMITY VENOUS (DVT)  Result Date: 02/04/2022  Lower Venous DVT Study Patient Name:  RITAMARIE ARKIN  Date of Exam:   02/04/2022 Medical Rec #: [id number]        Accession #:    [id number] Date of Birth: 24-Aug-1933         Patient Gender: F Patient Age:   36 years Exam Location:  [facility] Procedure:      VAS [place] LOWER EXTREMITY VENOUS (DVT) Referring Phys: Archie Patten HALL --------------------------------------------------------------------------------  Indications: Swelling.  Limitations: Edema, pain with compression. Comparison Study: No prior study Performing Technologist: Sharion Dove RVS  Examination Guidelines: A complete evaluation includes B-mode imaging, spectral Doppler, color Doppler, and power Doppler as needed of all accessible portions of each vessel. Bilateral testing is considered an integral part of a complete examination. Limited examinations for reoccurring indications may be performed as noted. The reflux portion of the exam is performed with the patient in reverse Trendelenburg.  +---------+---------------+---------+-----------+----------+--------------+ RIGHT    CompressibilityPhasicitySpontaneityPropertiesThrombus Aging  +---------+---------------+---------+-----------+----------+--------------+ CFV      Full           Yes      Yes                                 +---------+---------------+---------+-----------+----------+--------------+ SFJ      Full                                                        +---------+---------------+---------+-----------+----------+--------------+  FV Prox  Full                                                        +---------+---------------+---------+-----------+----------+--------------+ FV Mid   Full                                                        +---------+---------------+---------+-----------+----------+--------------+ FV DistalFull                                                        +---------+---------------+---------+-----------+----------+--------------+ PFV      Full                                                        +---------+---------------+---------+-----------+----------+--------------+ POP      Full           Yes      Yes                                 +---------+---------------+---------+-----------+----------+--------------+ PTV      Full                                                        +---------+---------------+---------+-----------+----------+--------------+ PERO     Full                                                        +---------+---------------+---------+-----------+----------+--------------+   +---------+---------------+---------+-----------+----------+--------------+ LEFT     CompressibilityPhasicitySpontaneityPropertiesThrombus Aging +---------+---------------+---------+-----------+----------+--------------+ CFV      Full           Yes      Yes                                 +---------+---------------+---------+-----------+----------+--------------+ SFJ      Full                                                         +---------+---------------+---------+-----------+----------+--------------+ FV Prox  Full                                                        +---------+---------------+---------+-----------+----------+--------------+  FV Mid   Full                                                        +---------+---------------+---------+-----------+----------+--------------+ FV DistalFull                                                        +---------+---------------+---------+-----------+----------+--------------+ PFV      Full                                                        +---------+---------------+---------+-----------+----------+--------------+ POP      Full           Yes      Yes                                 +---------+---------------+---------+-----------+----------+--------------+ PTV      Full                                                        +---------+---------------+---------+-----------+----------+--------------+ PERO     Full                                                        +---------+---------------+---------+-----------+----------+--------------+     Summary: BILATERAL: - No evidence of deep vein thrombosis seen in the lower extremities, bilaterally. -No evidence of popliteal cyst, bilaterally.   *See table(s) above for measurements and observations. Electronically signed by Jamelle Haring on 02/04/2022 at 4:40:52 PM.    Final    CT ABDOMEN PELVIS W CONTRAST  Result Date: 02/03/2022 CLINICAL DATA:  Dizziness and weakness EXAM: CT ANGIOGRAPHY CHEST CT ABDOMEN AND PELVIS WITH CONTRAST TECHNIQUE: Multidetector CT imaging of the chest was performed using the standard protocol during bolus administration of intravenous contrast. Multiplanar CT image reconstructions and MIPs were obtained to evaluate the vascular anatomy. Multidetector CT imaging of the abdomen and pelvis was performed using the standard protocol during bolus administration of  intravenous contrast. RADIATION DOSE REDUCTION: This exam was performed according to the departmental dose-optimization program which includes automated exposure control, adjustment of the mA and/or kV according to patient size and/or use of iterative reconstruction technique. CONTRAST:  51m OMNIPAQUE IOHEXOL 350 MG/ML SOLN COMPARISON:  Chest x-ray from earlier in the same day. FINDINGS: CTA CHEST FINDINGS Cardiovascular: Thoracic aorta demonstrates atherosclerotic calcifications. No aneurysmal dilatation or dissection is seen. The coronary arteries demonstrate scattered atherosclerotic calcifications. Pulmonary artery shows a normal branching pattern bilaterally. No filling defect to suggest pulmonary embolism is seen. Mediastinum/Nodes: Thoracic inlet is within normal limits. No hilar or mediastinal adenopathy is noted.  The esophagus is fluid-filled which may be related to reflux. Small sliding-type hiatal hernia is noted. Lungs/Pleura: Lungs are well aerated bilaterally. Patchy ground-glass airspace opacities are noted consistent with multifocal infiltrate. No effusion is seen. No sizable parenchymal nodule is noted. Musculoskeletal: Degenerative changes of the thoracic spine are seen. No acute rib abnormality is noted. Review of the MIP images confirms the above findings. CT ABDOMEN and PELVIS FINDINGS Hepatobiliary: No focal liver abnormality is seen. Status post cholecystectomy. No biliary dilatation. Pancreas: Unremarkable. No pancreatic ductal dilatation or surrounding inflammatory changes. Spleen: Normal in size without focal abnormality. Adrenals/Urinary Tract: Adrenal glands show stable left adrenal nodule measuring 1.3 cm. Given the long-term stability this is felt to represent an adenoma and further follow-up is recommended. Right adrenal gland is within normal limits. Kidneys demonstrate a normal enhancement pattern bilaterally. No renal calculi or obstructive changes are seen. The bladder is  partially distended. Stomach/Bowel: Mild diverticular change of the colon is noted. No diverticulitis is seen. No obstructive changes are seen. The appendix is within normal limits. Small bowel and stomach are within normal limits. Vascular/Lymphatic: Aortic atherosclerosis. No enlarged abdominal or pelvic lymph nodes. Reproductive: Status post hysterectomy. No adnexal masses. Other: Small fat containing umbilical hernia is noted. Small fat containing supraumbilical hernia is noted as well. No free fluid is noted. Musculoskeletal: Degenerative changes of lumbar spine are noted. No compression deformity is seen. Review of the MIP images confirms the above findings. IMPRESSION: CTA of the chest: No evidence of pulmonary emboli. Patchy airspace opacities bilaterally consistent with multifocal infiltrate. CT of the abdomen and pelvis: Stable left adrenal nodule from 2018 most consistent with an adenoma. No further follow-up is recommended. Mild diverticular changes noted without diverticulitis. Small umbilical and supraumbilical hernias are noted. Electronically Signed   By: Inez Catalina M.D.   On: 02/03/2022 23:25   CT Angio Chest PE W and/or Wo Contrast  Result Date: 02/03/2022 CLINICAL DATA:  Dizziness and weakness EXAM: CT ANGIOGRAPHY CHEST CT ABDOMEN AND PELVIS WITH CONTRAST TECHNIQUE: Multidetector CT imaging of the chest was performed using the standard protocol during bolus administration of intravenous contrast. Multiplanar CT image reconstructions and MIPs were obtained to evaluate the vascular anatomy. Multidetector CT imaging of the abdomen and pelvis was performed using the standard protocol during bolus administration of intravenous contrast. RADIATION DOSE REDUCTION: This exam was performed according to the departmental dose-optimization program which includes automated exposure control, adjustment of the mA and/or kV according to patient size and/or use of iterative reconstruction technique.  CONTRAST:  24m OMNIPAQUE IOHEXOL 350 MG/ML SOLN COMPARISON:  Chest x-ray from earlier in the same day. FINDINGS: CTA CHEST FINDINGS Cardiovascular: Thoracic aorta demonstrates atherosclerotic calcifications. No aneurysmal dilatation or dissection is seen. The coronary arteries demonstrate scattered atherosclerotic calcifications. Pulmonary artery shows a normal branching pattern bilaterally. No filling defect to suggest pulmonary embolism is seen. Mediastinum/Nodes: Thoracic inlet is within normal limits. No hilar or mediastinal adenopathy is noted. The esophagus is fluid-filled which may be related to reflux. Small sliding-type hiatal hernia is noted. Lungs/Pleura: Lungs are well aerated bilaterally. Patchy ground-glass airspace opacities are noted consistent with multifocal infiltrate. No effusion is seen. No sizable parenchymal nodule is noted. Musculoskeletal: Degenerative changes of the thoracic spine are seen. No acute rib abnormality is noted. Review of the MIP images confirms the above findings. CT ABDOMEN and PELVIS FINDINGS Hepatobiliary: No focal liver abnormality is seen. Status post cholecystectomy. No biliary dilatation. Pancreas: Unremarkable. No pancreatic ductal dilatation or surrounding inflammatory  changes. Spleen: Normal in size without focal abnormality. Adrenals/Urinary Tract: Adrenal glands show stable left adrenal nodule measuring 1.3 cm. Given the long-term stability this is felt to represent an adenoma and further follow-up is recommended. Right adrenal gland is within normal limits. Kidneys demonstrate a normal enhancement pattern bilaterally. No renal calculi or obstructive changes are seen. The bladder is partially distended. Stomach/Bowel: Mild diverticular change of the colon is noted. No diverticulitis is seen. No obstructive changes are seen. The appendix is within normal limits. Small bowel and stomach are within normal limits. Vascular/Lymphatic: Aortic atherosclerosis. No  enlarged abdominal or pelvic lymph nodes. Reproductive: Status post hysterectomy. No adnexal masses. Other: Small fat containing umbilical hernia is noted. Small fat containing supraumbilical hernia is noted as well. No free fluid is noted. Musculoskeletal: Degenerative changes of lumbar spine are noted. No compression deformity is seen. Review of the MIP images confirms the above findings. IMPRESSION: CTA of the chest: No evidence of pulmonary emboli. Patchy airspace opacities bilaterally consistent with multifocal infiltrate. CT of the abdomen and pelvis: Stable left adrenal nodule from 2018 most consistent with an adenoma. No further follow-up is recommended. Mild diverticular changes noted without diverticulitis. Small umbilical and supraumbilical hernias are noted. Electronically Signed   By: Inez Catalina M.D.   On: 02/03/2022 23:25   DG Chest 2 View  Result Date: 02/03/2022 CLINICAL DATA:  Shortness of breath EXAM: CHEST - 2 VIEW COMPARISON:  09/02/2021 FINDINGS: Transverse diameter of heart is increased. There are no signs of pulmonary edema or focal pulmonary consolidation. In the PA view, there is subtle increased density in left lower lung fields which could not be localized in the lateral projection. This may be due to chest wall attenuation. Dense calcification is seen in the region of mitral annulus. IMPRESSION: There are no signs of pulmonary edema or focal pulmonary consolidation. Electronically Signed   By: Elmer Picker M.D.   On: 02/03/2022 17:36        Scheduled Meds:  enoxaparin (LOVENOX) injection  30 mg Subcutaneous Q24H   fluticasone  2 spray Each Nare Daily   gabapentin  100 mg Oral TID   ipratropium-albuterol  3 mL Nebulization BID   montelukast  10 mg Oral Daily   multivitamin with minerals  1 tablet Oral Daily   pantoprazole  80 mg Oral Q1200   polycarbophil  625 mg Oral Daily   Continuous Infusions:  ceFEPime (MAXIPIME) IV 2 g (02/05/22 0057)     LOS: 2 days     Time spent: 45 minutes spent on chart review, discussion with nursing staff, consultants, updating family and interview/physical exam; more than 50% of that time was spent in counseling and/or coordination of care.    Geradine Girt, DO [facility] Available via Epic secure chat 7am-7pm After these hours, please refer to coverage provider listed on [email or web address] 02/05/2022, 11:54 AM

## 2022-02-05 NOTE — Progress Notes (Signed)
Mobility Specialist Progress Note:   02/05/22 1701  Mobility  Activity Ambulated with assistance in hallway  Activity Response Tolerated well  Distance Ambulated (ft) 100 ft  $Mobility charge 1 Mobility  Level of Assistance Standby assist, set-up cues, supervision of patient - no hands on  Assistive Device Front wheel walker  Mobility Referral Yes   Pt received in bathroom and agreeable. No complaints. Pt left sitting in chair with all needs met and call bell in reach.   Charley Miske Mobility Specialist-Acute Rehab Secure Chat only

## 2022-02-05 NOTE — Evaluation (Signed)
Physical Therapy Evaluation Patient Details Name: Melissa Morton MRN: [id number] DOB: [birth year] Today's Date: 02/05/2022  History of Present Illness  Pt is 88 yo female who presented to [facility] ED with complaints of lightheadedness, dizziness and generalized weakness of 2 days duration on 02/03/22. Found to have sepsis secondary to presumptive UTI. PMH includes breast cancer in remission, CKD 3B, recurrent cystitis, post cholecystectomy, asthma, allergic rhinitis.  Clinical Impression  Pt admitted with above diagnosis. At baseline , pt uses rollator or cane and can ambulate in community.  She lives with family , has 24 hr support if needed, and has DME.  Today, pt able to transfer and ambulate at supervision level. Reports she feels back to baseline mobility.  No further acute PT needs.         Recommendations for follow up therapy are one component of a multi-disciplinary discharge planning process, led by the attending physician.  Recommendations may be updated based on patient status, additional functional criteria and insurance authorization.  Follow Up Recommendations No PT follow up      Assistance Recommended at Discharge PRN  Patient can return home with the following  Help with stairs or ramp for entrance    Equipment Recommendations None recommended by PT  Recommendations for Other Services       Functional Status Assessment Patient has not had a recent decline in their functional status     Precautions / Restrictions Precautions Precautions: Fall Restrictions Weight Bearing Restrictions: No      Mobility  Bed Mobility               General bed mobility comments: In chair; was supervision w OT    Transfers Overall transfer level: Needs assistance Equipment used: Rolling walker (2 wheels) Transfers: Sit to/from Stand Sit to Stand: Supervision           General transfer comment: From recliner    Ambulation/Gait Ambulation/Gait assistance:  Supervision Gait Distance (Feet): 200 Feet Assistive device: Rolling walker (2 wheels) Gait Pattern/deviations: Step-through pattern Gait velocity: normal     General Gait Details: Steady gait, no LOB, able to lift walker and navigate obstacles in room, reports baseline  Stairs            Wheelchair Mobility    Modified Rankin (Stroke Patients Only)       Balance Overall balance assessment: Modified Independent Sitting-balance support: No upper extremity supported, Feet supported Sitting balance-Leahy Scale: Good     Standing balance support: Bilateral upper extremity supported, No upper extremity supported Standing balance-Leahy Scale: Fair Standing balance comment: RW to ambulate (cane or rollator baseline); could stand without UE support                             Pertinent Vitals/Pain Pain Assessment Pain Assessment: No/denies pain    Home Living Family/patient expects to be discharged to:: Private residence Living Arrangements: Children Available Help at Discharge: Family;Available 24 hours/day Type of Home: House Home Access: Stairs to enter Entrance Stairs-Rails: None Entrance Stairs-Number of Steps: 4   Home Layout: One level Home Equipment: Rollator (4 wheels);Cane - single point;Shower seat;Grab bars - tub/shower;Wheelchair - manual Additional Comments: lives with daughter and her family. 2 other daughters live locally.    Prior Function Prior Level of Function : Independent/Modified Independent             Mobility Comments: cane in the home, rollator for community (vs w/c). Can  ambulate in community with rest breaks       Hand Dominance   Dominant Hand: Right    Extremity/Trunk Assessment   Upper Extremity Assessment Upper Extremity Assessment: Defer to OT evaluation    Lower Extremity Assessment Lower Extremity Assessment: Overall WFL for tasks assessed (+ edema R LE -reports baseline)    Cervical / Trunk  Assessment Cervical / Trunk Assessment: Normal  Communication   Communication: HOH  Cognition Arousal/Alertness: Awake/alert Behavior During Therapy: WFL for tasks assessed/performed Overall Cognitive Status: Within Functional Limits for tasks assessed                                          General Comments General comments (skin integrity, edema, etc.): Denied any lightheadedness today    Exercises     Assessment/Plan    PT Assessment Patient does not need any further PT services  PT Problem List         PT Treatment Interventions      PT Goals (Current goals can be found in the Care Plan section)  Acute Rehab PT Goals Patient Stated Goal: return home PT Goal Formulation: All assessment and education complete, DC therapy    Frequency       Co-evaluation               AM-PAC PT "6 Clicks" Mobility  Outcome Measure Help needed turning from your back to your side while in a flat bed without using bedrails?: None Help needed moving from lying on your back to sitting on the side of a flat bed without using bedrails?: None Help needed moving to and from a bed to a chair (including a wheelchair)?: None Help needed standing up from a chair using your arms (e.g., wheelchair or bedside chair)?: None Help needed to walk in hospital room?: A Little Help needed climbing 3-5 steps with a railing? : A Little 6 Click Score: 22    End of Session Equipment Utilized During Treatment: Gait belt Activity Tolerance: Patient tolerated treatment well Patient left: in chair;with call bell/phone within reach Nurse Communication: Mobility status PT Visit Diagnosis: Other abnormalities of gait and mobility (R26.89)    Time: 7939-0300 PT Time Calculation (min) (ACUTE ONLY): 12 min   Charges:   PT Evaluation $PT Eval Low Complexity: 1 Low          Melissa Morton, PT Acute Rehab [facility] [phone]   Karlton Lemon 02/05/2022, 11:20 AM

## 2022-02-05 NOTE — Evaluation (Addendum)
Occupational Therapy Evaluation Patient Details Name: Melissa Morton MRN: [id number] DOB: 05/01/1934 Today's Date: 02/05/2022   History of Present Illness Pt is 88 yo female who presented to [facility] ED with complaints of lightheadedness, dizziness and generalized weakness of 2 days duration. Found to have sepsis secondary to presumptive UTI. PMH includes breast cancer in remission, CKD 3B, recurrent cystitis, post cholecystectomy, asthma, allergic rhinitis.   Clinical Impression   Pt admitted with the above diagnoses and presents with below problem list. Pt will benefit from continued acute OT to address the below listed deficits and maximize independence with basic ADLs prior to d/c home with family. At baseline, pt lives with family who provide 24 hour supervision. She is mod I with ADLs at baseline. Pt currently needs up to min guard assist with LB ADLs and functional mobility. Tolerated session well. Up in recliner at end of session.        Recommendations for follow up therapy are one component of a multi-disciplinary discharge planning process, led by the attending physician.  Recommendations may be updated based on patient status, additional functional criteria and insurance authorization.   Follow Up Recommendations  No OT follow up    Assistance Recommended at Discharge Intermittent Supervision/Assistance  Patient can return home with the following A little help with bathing/dressing/bathroom;Assist for transportation    Functional Status Assessment  Patient has had a recent decline in their functional status and demonstrates the ability to make significant improvements in function in a reasonable and predictable amount of time.  Equipment Recommendations  None recommended by OT    Recommendations for Other Services PT consult     Precautions / Restrictions Precautions Precautions: Fall Restrictions Weight Bearing Restrictions: No      Mobility Bed Mobility Overal bed  mobility: Needs Assistance Bed Mobility: Supine to Sit     Supine to sit: Supervision          Transfers Overall transfer level: Needs assistance Equipment used: Rolling walker (2 wheels) Transfers: Sit to/from Stand Sit to Stand: Min guard, Supervision           General transfer comment: to/from EOB and recliner      Balance Overall balance assessment: Needs assistance Sitting-balance support: No upper extremity supported, Feet supported Sitting balance-Leahy Scale: Good     Standing balance support: Single extremity supported, No upper extremity supported, During functional activity Standing balance-Leahy Scale: Poor Standing balance comment: able to stand to complete grooming tasks at sink. cane vs rollator at baseline.                           ADL either performed or assessed with clinical judgement   ADL Overall ADL's : Needs assistance/impaired Eating/Feeding: Set up;Sitting   Grooming: Supervision/safety;Standing;Oral care;Wash/dry face;Wash/dry hands   Upper Body Bathing: Set up;Sitting   Lower Body Bathing: Min guard;Sit to/from stand   Upper Body Dressing : Set up;Sitting   Lower Body Dressing: Min guard;Sit to/from stand   Toilet Transfer: Supervision/safety;Min guard;Ambulation;Comfort height toilet;Rolling walker (2 wheels)   Toileting- Clothing Manipulation and Hygiene: Supervision/safety;Set up;Min guard;Sit to/from stand   Tub/ Shower Transfer: Tub transfer;Min guard;Ambulation;Shower seat;Rolling walker (2 wheels)   Functional mobility during ADLs: Supervision/safety;Rolling walker (2 wheels) General ADL Comments: Pt completed 3 grooming tasks in standing. [employer] in sit<>stand at supervision level.     Vision Baseline Vision/History: 1 Wears glasses       Perception     Praxis  Pertinent Vitals/Pain Pain Assessment Pain Assessment: No/denies pain     Hand Dominance Right   Extremity/Trunk Assessment Upper  Extremity Assessment Upper Extremity Assessment: Generalized weakness   Lower Extremity Assessment Lower Extremity Assessment: Defer to PT evaluation       Communication Communication Communication: HOH (hearing aids)   Cognition Arousal/Alertness: Awake/alert Behavior During Therapy: WFL for tasks assessed/performed Overall Cognitive Status: Within Functional Limits for tasks assessed                                       General Comments       Exercises     Shoulder Instructions      Home Living Family/patient expects to be discharged to:: Private residence Living Arrangements: Children Available Help at Discharge: Family;Available 24 hours/day Type of Home: House Home Access: Stairs to enter [employer] of Steps: 4 Entrance Stairs-Rails: None Home Layout: One level     Bathroom Shower/Tub: Tub/shower unit         Home Equipment: Rollator (4 wheels);Cane - single point;Shower seat;Grab bars - tub/shower;Wheelchair - manual   Additional Comments: lives with daughter and her family. 2 other daughters live locally.      Prior Functioning/Environment Prior Level of Function : Independent/Modified Independent             Mobility Comments: cane in the home, rollator for community (vs w/c).          OT Problem List: Decreased strength;Decreased activity tolerance;Impaired balance (sitting and/or standing);Decreased knowledge of use of DME or AE;Decreased knowledge of precautions      OT Treatment/Interventions: Self-care/ADL training;Therapeutic exercise;Energy conservation;DME and/or AE instruction;Therapeutic activities;Patient/family education;Balance training    OT Goals(Current goals can be found in the care plan section) Acute Rehab OT Goals Patient Stated Goal: not stated. OT Goal Formulation: With patient Time For Goal Achievement: 02/19/22 Potential to Achieve Goals: Good  OT Frequency: Min 2X/week     Co-evaluation              AM-PAC OT "6 Clicks" Daily Activity     Outcome Measure Help from another person eating meals?: None Help from another person taking care of personal grooming?: None Help from another person toileting, which includes using toliet, bedpan, or urinal?: A Little Help from another person bathing (including washing, rinsing, drying)?: A Little Help from another person to put on and taking off regular upper body clothing?: None Help from another person to put on and taking off regular lower body clothing?: A Little 6 Click Score: 21   End of Session Equipment Utilized During Treatment: Rolling walker (2 wheels) Nurse Communication: Mobility status (NT)  Activity Tolerance: Patient tolerated treatment well Patient left: in chair;with call bell/phone within reach  OT Visit Diagnosis: Unsteadiness on feet (R26.81);Muscle weakness (generalized) (M62.81)                Time: 9629-5284 OT Time Calculation (min): 22 min Charges:  OT General Charges $OT Visit: 1 Visit OT Treatments $OT Eval Low Complexity: [place], OT Acute Rehabilitation Services Office: [place], [place] 02/05/2022, 10:13 AM

## 2022-02-05 NOTE — Care Management Important Message (Signed)
Important Message  Patient Details  Name: Melissa Morton MRN: [id number] Date of Birth: [birth year]   Medicare Important Message Given:  Yes     Orbie Pyo 02/05/2022, 4:17 PM

## 2022-02-06 DIAGNOSIS — R7989 Other specified abnormal findings of blood chemistry: Secondary | ICD-10-CM

## 2022-02-06 LAB — BASIC METABOLIC PANEL
Anion gap: 9 (ref 5–15)
BUN: 22 mg/dL (ref 8–23)
CO2: 23 mmol/L (ref 22–32)
Calcium: 9.2 mg/dL (ref 8.9–10.3)
Chloride: 107 mmol/L (ref 98–111)
Creatinine, Ser: 1.27 mg/dL — ABNORMAL HIGH (ref 0.44–1.00)
GFR, Estimated: 41 mL/min — ABNORMAL LOW (ref >60)
Glucose, Bld: 105 mg/dL — ABNORMAL HIGH (ref 70–99)
Potassium: 4.4 mmol/L (ref 3.5–5.1)
Sodium: 139 mmol/L (ref 135–145)

## 2022-02-06 LAB — CBC
HCT: 26.9 % — ABNORMAL LOW (ref 36.0–46.0)
Hemoglobin: 8.9 g/dL — ABNORMAL LOW (ref 12.0–15.0)
MCH: 31 pg (ref 26.0–34.0)
MCHC: 33.1 g/dL (ref 30.0–36.0)
MCV: 93.7 fL (ref 80.0–100.0)
Platelets: 211 10*3/uL (ref 150–400)
RBC: 2.87 MIL/uL — ABNORMAL LOW (ref 3.87–5.11)
RDW: 14.5 % (ref 11.5–15.5)
WBC: 9.6 10*3/uL (ref 4.0–10.5)
nRBC: 0 % (ref 0.0–0.2)

## 2022-02-06 LAB — LIPASE, BLOOD: Lipase: 46 U/L (ref 11–51)

## 2022-02-06 LAB — URINE CULTURE: Culture: >=100000 — AB

## 2022-02-06 MED ORDER — CEFDINIR 300 MG PO CAPS: 300.0000 mg | ORAL_CAPSULE | Freq: Two times a day (BID) | ORAL | Status: DC

## 2022-02-06 MED ORDER — CEFDINIR 300 MG PO CAPS
300.0000 mg | ORAL_CAPSULE | Freq: Two times a day (BID) | ORAL | Status: DC
  Filled 2022-02-06 (×2): qty 1

## 2022-02-06 MED ORDER — CEFDINIR 300 MG PO CAPS
300.0000 mg | ORAL_CAPSULE | Freq: Two times a day (BID) | ORAL | Status: DC
  Filled 2022-02-06: qty 1

## 2022-02-06 MED ORDER — SIMVASTATIN 40 MG PO TABS: 40.0000 mg | ORAL_TABLET | Freq: Every evening | ORAL | Status: DC

## 2022-02-06 MED ORDER — CEFDINIR 300 MG PO CAPS: 300.0000 mg | ORAL_CAPSULE | Freq: Two times a day (BID) | ORAL | 0 refills | Status: DC

## 2022-02-06 NOTE — Progress Notes (Signed)
Discharge instructions given to patient.  Patient verbalized understanding.  Patient is waiting for daughter for her clothes.

## 2022-02-06 NOTE — Discharge Summary (Signed)
Physician Discharge Summary  Melissa Morton KGU:[id number] DOB: 11-13-1933 DOA: 02/03/2022  PCP: [name], MD  Admit date: 02/03/2022 Discharge date: 02/06/2022  Admitted From: home Discharge disposition: home   Recommendations for Outpatient Follow-Up:   Finish course of PO abx for UTI CMP 1 week to follow up on LFTs   Discharge Diagnosis:   Principal Problem:   UTI (urinary tract infection) Klebsiella    Discharge Condition: Improved.  Diet recommendation:   Regular.  Wound care: None.  Code status: Full.   History of Present Illness:   Melissa Morton is a 88 y.o. female with medical history significant for breast cancer in remission, CKD 3B, recurrent cystitis, post cholecystectomy, asthma, allergic rhinitis, who presented to [facility] ED with complaints of lightheadedness, dizziness and generalized weakness of 2 days duration.  Symptoms became worse on the day of presentation.  Associated with nausea and vomiting x1.  Has had a cough for about a week, was prescribed benzonatate by PCP.  She presented to the ED for further evaluation.     Work-up in the ED revealed no pulmonary embolism on CT angio chest, multifocal infiltrates suggestive of pneumonia, seen on CT scan.  Also remarkable for urine analysis positive for pyuria, elevated liver chemistries, and elevated lipase level.  CT scan findings were unrevealing.  The patient was started on cefepime, TRH, hospitalist service, was asked to admit.   Hospital Course by Problem:   Sepsis secondary to presumptive UTI, POA. Culture with gram negative rods-- proteus -change to PO abx and finish course   Acute transaminitis, unclear etiology Suspect side effect from various home medications plus hypotension from sepsis -trending down   Non anion gap metabolic acidosis -resolved   Hypokalemia Serum potassium 3.4 -repleted   CKD 3B Appears to be at her baseline creatinine 1.36 with GFR 38. -stable    Generalized weakness PT OT - no follow up   Bilateral lower extremity edema with tenderness left greater than right -duplex negative    lipase elevated-- unclear reason -resolved, CT scan unremarkable--will need outpatient follow up      Medical Consultants:      Discharge Exam:   Vitals:   02/06/22 0451 02/06/22 0948  BP: (!) 155/66 (!) 143/61  Pulse: 65 71  Resp: 16 18  Temp: 98.1 F (36.7 C) 98 F (36.7 C)  SpO2:  98%   Vitals:   02/05/22 1714 02/05/22 2214 02/06/22 0451 02/06/22 0948  BP: (!) 150/61 (!) 156/75 (!) 155/66 (!) 143/61  Pulse: 64 68 65 71  Resp: '15 18 16 18  '$ Temp: 98.2 F (36.8 C) 97.6 F (36.4 C) 98.1 F (36.7 C) 98 F (36.7 C)  TempSrc: Oral Oral Oral Oral  SpO2: 99% 98%  98%  Weight:      Height:        General exam: Appears calm and comfortable   The results of significant diagnostics from this hospitalization (including imaging, microbiology, ancillary and laboratory) are listed below for reference.     Procedures and Diagnostic Studies:   VAS [place] LOWER EXTREMITY VENOUS (DVT)  Result Date: 02/04/2022  Lower Venous DVT Study Patient Name:  Melissa Morton  Date of Exam:   02/04/2022 Medical Rec #: [id number]        Accession #:    [id number] Date of Birth: January 27, 1934         Patient Gender: F Patient Age:   5 years [facility]am [facility] Procedure:      VAS [place] LOWER EXTREMITY VENOUS (DVT) Referring Phys: CAROLE HALL --------------------------------------------------------------------------------  Indications: Swelling.  Limitations: Edema, pain with compression. Comparison Study: No prior study Performing Technologist: Sharion Dove RVS  Examination Guidelines: A complete evaluation includes B-mode imaging, spectral Doppler, color Doppler, and power Doppler as needed of all accessible portions of each vessel. Bilateral testing is considered an integral part of a complete examination. Limited examinations for reoccurring  indications may be performed as noted. The reflux portion of the exam is performed with the patient in reverse Trendelenburg.  +---------+---------------+---------+-----------+----------+--------------+ RIGHT    CompressibilityPhasicitySpontaneityPropertiesThrombus Aging +---------+---------------+---------+-----------+----------+--------------+ CFV      Full           Yes      Yes                                 +---------+---------------+---------+-----------+----------+--------------+ SFJ      Full                                                        +---------+---------------+---------+-----------+----------+--------------+ FV Prox  Full                                                        +---------+---------------+---------+-----------+----------+--------------+ FV Mid   Full                                                        +---------+---------------+---------+-----------+----------+--------------+ FV DistalFull                                                        +---------+---------------+---------+-----------+----------+--------------+ PFV      Full                                                        +---------+---------------+---------+-----------+----------+--------------+ POP      Full           Yes      Yes                                 +---------+---------------+---------+-----------+----------+--------------+ PTV      Full                                                        +---------+---------------+---------+-----------+----------+--------------+ PERO     Full                                                        +---------+---------------+---------+-----------+----------+--------------+   +---------+---------------+---------+-----------+----------+--------------+  LEFT     CompressibilityPhasicitySpontaneityPropertiesThrombus Aging  +---------+---------------+---------+-----------+----------+--------------+ CFV      Full           Yes      Yes                                 +---------+---------------+---------+-----------+----------+--------------+ SFJ      Full                                                        +---------+---------------+---------+-----------+----------+--------------+ FV Prox  Full                                                        +---------+---------------+---------+-----------+----------+--------------+ FV Mid   Full                                                        +---------+---------------+---------+-----------+----------+--------------+ FV DistalFull                                                        +---------+---------------+---------+-----------+----------+--------------+ PFV      Full                                                        +---------+---------------+---------+-----------+----------+--------------+ POP      Full           Yes      Yes                                 +---------+---------------+---------+-----------+----------+--------------+ PTV      Full                                                        +---------+---------------+---------+-----------+----------+--------------+ PERO     Full                                                        +---------+---------------+---------+-----------+----------+--------------+     Summary: BILATERAL: - No evidence of deep vein thrombosis seen in the lower extremities, bilaterally. -No evidence of popliteal cyst, bilaterally.   *See table(s) above for measurements and observations. Electronically signed by Jamelle Haring on 02/04/2022 at 4:40:52 PM.  Final    CT ABDOMEN PELVIS W CONTRAST  Result Date: 02/03/2022 CLINICAL DATA:  Dizziness and weakness EXAM: CT ANGIOGRAPHY CHEST CT ABDOMEN AND PELVIS WITH CONTRAST TECHNIQUE: Multidetector CT imaging of the chest was  performed using the standard protocol during bolus administration of intravenous contrast. Multiplanar CT image reconstructions and MIPs were obtained to evaluate the vascular anatomy. Multidetector CT imaging of the abdomen and pelvis was performed using the standard protocol during bolus administration of intravenous contrast. RADIATION DOSE REDUCTION: This exam was performed according to the departmental dose-optimization program which includes automated exposure control, adjustment of the mA and/or kV according to patient size and/or use of iterative reconstruction technique. CONTRAST:  5m OMNIPAQUE IOHEXOL 350 MG/ML SOLN COMPARISON:  Chest x-ray from earlier in the same day. FINDINGS: CTA CHEST FINDINGS Cardiovascular: Thoracic aorta demonstrates atherosclerotic calcifications. No aneurysmal dilatation or dissection is seen. The coronary arteries demonstrate scattered atherosclerotic calcifications. Pulmonary artery shows a normal branching pattern bilaterally. No filling defect to suggest pulmonary embolism is seen. Mediastinum/Nodes: Thoracic inlet is within normal limits. No hilar or mediastinal adenopathy is noted. The esophagus is fluid-filled which may be related to reflux. Small sliding-type hiatal hernia is noted. Lungs/Pleura: Lungs are well aerated bilaterally. Patchy ground-glass airspace opacities are noted consistent with multifocal infiltrate. No effusion is seen. No sizable parenchymal nodule is noted. Musculoskeletal: Degenerative changes of the thoracic spine are seen. No acute rib abnormality is noted. Review of the MIP images confirms the above findings. CT ABDOMEN and PELVIS FINDINGS Hepatobiliary: No focal liver abnormality is seen. Status post cholecystectomy. No biliary dilatation. Pancreas: Unremarkable. No pancreatic ductal dilatation or surrounding inflammatory changes. Spleen: Normal in size without focal abnormality. Adrenals/Urinary Tract: Adrenal glands show stable left adrenal  nodule measuring 1.3 cm. Given the long-term stability this is felt to represent an adenoma and further follow-up is recommended. Right adrenal gland is within normal limits. Kidneys demonstrate a normal enhancement pattern bilaterally. No renal calculi or obstructive changes are seen. The bladder is partially distended. Stomach/Bowel: Mild diverticular change of the colon is noted. No diverticulitis is seen. No obstructive changes are seen. The appendix is within normal limits. Small bowel and stomach are within normal limits. Vascular/Lymphatic: Aortic atherosclerosis. No enlarged abdominal or pelvic lymph nodes. Reproductive: Status post hysterectomy. No adnexal masses. Other: Small fat containing umbilical hernia is noted. Small fat containing supraumbilical hernia is noted as well. No free fluid is noted. Musculoskeletal: Degenerative changes of lumbar spine are noted. No compression deformity is seen. Review of the MIP images confirms the above findings. IMPRESSION: CTA of the chest: No evidence of pulmonary emboli. Patchy airspace opacities bilaterally consistent with multifocal infiltrate. CT of the abdomen and pelvis: Stable left adrenal nodule from 2018 most consistent with an adenoma. No further follow-up is recommended. Mild diverticular changes noted without diverticulitis. Small umbilical and supraumbilical hernias are noted. Electronically Signed   By: MInez CatalinaM.D.   On: 02/03/2022 23:25   CT Angio Chest PE W and/or Wo Contrast  Result Date: 02/03/2022 CLINICAL DATA:  Dizziness and weakness EXAM: CT ANGIOGRAPHY CHEST CT ABDOMEN AND PELVIS WITH CONTRAST TECHNIQUE: Multidetector CT imaging of the chest was performed using the standard protocol during bolus administration of intravenous contrast. Multiplanar CT image reconstructions and MIPs were obtained to evaluate the vascular anatomy. Multidetector CT imaging of the abdomen and pelvis was performed using the standard protocol during bolus  administration of intravenous contrast. RADIATION DOSE REDUCTION: This exam was performed according to the departmental  dose-optimization program which includes automated exposure control, adjustment of the mA and/or kV according to patient size and/or use of iterative reconstruction technique. CONTRAST:  49m OMNIPAQUE IOHEXOL 350 MG/ML SOLN COMPARISON:  Chest x-ray from earlier in the same day. FINDINGS: CTA CHEST FINDINGS Cardiovascular: Thoracic aorta demonstrates atherosclerotic calcifications. No aneurysmal dilatation or dissection is seen. The coronary arteries demonstrate scattered atherosclerotic calcifications. Pulmonary artery shows a normal branching pattern bilaterally. No filling defect to suggest pulmonary embolism is seen. Mediastinum/Nodes: Thoracic inlet is within normal limits. No hilar or mediastinal adenopathy is noted. The esophagus is fluid-filled which may be related to reflux. Small sliding-type hiatal hernia is noted. Lungs/Pleura: Lungs are well aerated bilaterally. Patchy ground-glass airspace opacities are noted consistent with multifocal infiltrate. No effusion is seen. No sizable parenchymal nodule is noted. Musculoskeletal: Degenerative changes of the thoracic spine are seen. No acute rib abnormality is noted. Review of the MIP images confirms the above findings. CT ABDOMEN and PELVIS FINDINGS Hepatobiliary: No focal liver abnormality is seen. Status post cholecystectomy. No biliary dilatation. Pancreas: Unremarkable. No pancreatic ductal dilatation or surrounding inflammatory changes. Spleen: Normal in size without focal abnormality. Adrenals/Urinary Tract: Adrenal glands show stable left adrenal nodule measuring 1.3 cm. Given the long-term stability this is felt to represent an adenoma and further follow-up is recommended. Right adrenal gland is within normal limits. Kidneys demonstrate a normal enhancement pattern bilaterally. No renal calculi or obstructive changes are seen. The  bladder is partially distended. Stomach/Bowel: Mild diverticular change of the colon is noted. No diverticulitis is seen. No obstructive changes are seen. The appendix is within normal limits. Small bowel and stomach are within normal limits. Vascular/Lymphatic: Aortic atherosclerosis. No enlarged abdominal or pelvic lymph nodes. Reproductive: Status post hysterectomy. No adnexal masses. Other: Small fat containing umbilical hernia is noted. Small fat containing supraumbilical hernia is noted as well. No free fluid is noted. Musculoskeletal: Degenerative changes of lumbar spine are noted. No compression deformity is seen. Review of the MIP images confirms the above findings. IMPRESSION: CTA of the chest: No evidence of pulmonary emboli. Patchy airspace opacities bilaterally consistent with multifocal infiltrate. CT of the abdomen and pelvis: Stable left adrenal nodule from 2018 most consistent with an adenoma. No further follow-up is recommended. Mild diverticular changes noted without diverticulitis. Small umbilical and supraumbilical hernias are noted. Electronically Signed   By: MInez CatalinaM.D.   On: 02/03/2022 23:25   DG Chest 2 View  Result Date: 02/03/2022 CLINICAL DATA:  Shortness of breath EXAM: CHEST - 2 VIEW COMPARISON:  09/02/2021 FINDINGS: Transverse diameter of heart is increased. There are no signs of pulmonary edema or focal pulmonary consolidation. In the PA view, there is subtle increased density in left lower lung fields which could not be localized in the lateral projection. This may be due to chest wall attenuation. Dense calcification is seen in the region of mitral annulus. IMPRESSION: There are no signs of pulmonary edema or focal pulmonary consolidation. Electronically Signed   By: PElmer PickerM.D.   On: 02/03/2022 17:36     Labs:   Basic Metabolic Panel: Recent Labs  Lab 02/03/22 1645 02/03/22 2006 02/04/22 0130 02/05/22 0347 02/06/22 0252  NA 141 141 138 139 139   K 3.8 3.4* 4.0 4.2 4.4  CL 108 115* 109 108 107  CO2 22 19* 21* 23 23  GLUCOSE 139* 160* 117* 106* 105*  BUN 28* 23 25* 23 22  CREATININE 1.70* 1.36* 1.31* 1.36* 1.27*  CALCIUM 9.4 7.6*  8.1* 9.2 9.2  MG  --   --  1.6*  --   --   PHOS  --   --  2.8  --   --    GFR Estimated Creatinine Clearance: 30.1 mL/min (A) (by C-G formula based on SCr of 1.27 mg/dL (H)). Liver Function Tests: Recent Labs  Lab 02/03/22 1645 02/04/22 0130 02/05/22 0347  AST 854* 553* 146*  ALT 271* 284* 171*  ALKPHOS 328* 275* 230*  BILITOT 1.1 1.4* 0.8  PROT 6.7 5.6* 5.8*  ALBUMIN 3.6 2.9* 2.9*   Recent Labs  Lab 02/03/22 1645 02/06/22 0252  LIPASE 392* 46   No results for input(s): "AMMONIA" in the last 168 hours. Coagulation profile No results for input(s): "INR", "PROTIME" in the last 168 hours.  CBC: Recent Labs  Lab 02/03/22 1645 02/04/22 0130 02/05/22 0347 02/06/22 0252  WBC 24.5* 20.8* 11.5* 9.6  NEUTROABS 23.0* 19.0*  --   --   HGB 10.8* 9.5* 9.9* 8.9*  HCT 34.3* 30.8* 29.8* 26.9*  MCV 97.2 98.4 92.5 93.7  PLT 278 229 199 211   Cardiac Enzymes: No results for input(s): "CKTOTAL", "CKMB", "CKMBINDEX", "TROPONINI" in the last 168 hours. BNP: Invalid input(s): "POCBNP" CBG: Recent Labs  Lab 02/03/22 1643  GLUCAP 128*   D-Dimer Recent Labs    02/03/22 1645  DDIMER 2.62*   Hgb A1c No results for input(s): "HGBA1C" in the last 72 hours. Lipid Profile No results for input(s): "CHOL", "HDL", "LDLCALC", "TRIG", "CHOLHDL", "LDLDIRECT" in the last 72 hours. Thyroid function studies No results for input(s): "TSH", "T4TOTAL", "T3FREE", "THYROIDAB" in the last 72 hours.  Invalid input(s): "FREET3" Anemia work up No results for input(s): "VITAMINB12", "FOLATE", "FERRITIN", "TIBC", "IRON", "RETICCTPCT" in the last 72 hours. Microbiology Recent Results (from the past 240 hour(s))  Resp Panel by RT-PCR (Flu A&B, Covid) Anterior Nasal Swab     Status: None   Collection Time:  02/03/22  4:42 PM   Specimen: Anterior Nasal Swab  Result Value Ref Range Status   SARS Coronavirus 2 by RT PCR NEGATIVE NEGATIVE Final    Comment: (NOTE) SARS-CoV-2 target nucleic acids are NOT DETECTED.  The SARS-CoV-2 RNA is generally detectable in upper respiratory specimens during the acute phase of infection. The lowest concentration of SARS-CoV-2 viral copies this assay can detect is 138 copies/mL. A negative result does not preclude SARS-Cov-2 infection and should not be used as the sole basis for treatment or other patient management decisions. A negative result may occur with  improper specimen collection/handling, submission of specimen other than nasopharyngeal swab, presence of viral mutation(s) within the areas targeted by this assay, and inadequate number of viral copies(<138 copies/mL). A negative result must be combined with clinical observations, patient history, and epidemiological information. The expected result is Negative.  Fact Sheet for Patients:  EntrepreneurPulse.com.au  Fact Sheet for Healthcare Providers:  IncredibleEmployment.be  This test is no t yet approved or cleared by the [place] FDA and  has been authorized for detection and/or diagnosis of SARS-CoV-2 by FDA under an Emergency Use Authorization (EUA). This EUA will remain  in effect (meaning this test can be used) for the duration of the COVID-19 declaration under Section 564(b)(1) of the Act, 21 U.S.C.section 360bbb-3(b)(1), unless the authorization is terminated  or revoked sooner.       Influenza A by PCR NEGATIVE NEGATIVE Final   Influenza B by PCR NEGATIVE NEGATIVE Final    Comment: (NOTE) The Xpert Xpress SARS-CoV-2/FLU/RSV plus assay is  intended as an aid in the diagnosis of influenza from Nasopharyngeal swab specimens and should not be used as a sole basis for treatment. Nasal washings and aspirates are unacceptable for Xpert Xpress  SARS-CoV-2/FLU/RSV testing.  Fact Sheet for Patients: EntrepreneurPulse.com.au  Fact Sheet for Healthcare Providers: IncredibleEmployment.be  This test is not yet approved or cleared by the [place] FDA and has been authorized for detection and/or diagnosis of SARS-CoV-2 by FDA under an Emergency Use Authorization (EUA). This EUA will remain in effect (meaning this test can be used) for the duration of the COVID-19 declaration under Section 564(b)(1) of the Act, 21 U.S.C. section 360bbb-3(b)(1), unless the authorization is terminated or revoked.  Performed at [facility], [place] [address] [place], [place] [address]   Urine Culture     Status: Abnormal   Collection Time: 02/03/22 11:46 PM   Specimen: Urine, Clean Catch  Result Value Ref Range Status   Specimen Description URINE, CLEAN CATCH  Final   Special Requests   Final    NONE Performed at [facility], [place] [address] [place], [place] [address]    Culture >=100,000 COLONIES/mL PROTEUS MIRABILIS (A)  Final   Report Status 02/06/2022 FINAL  Final   Organism ID, Bacteria PROTEUS MIRABILIS (A)  Final      Susceptibility   Proteus mirabilis - MIC*    AMPICILLIN <=2 SENSITIVE Sensitive     CEFAZOLIN 8 SENSITIVE Sensitive     CEFEPIME <=0.12 SENSITIVE Sensitive     CEFTRIAXONE <=0.25 SENSITIVE Sensitive     CIPROFLOXACIN <=0.25 SENSITIVE Sensitive     GENTAMICIN <=1 SENSITIVE Sensitive     IMIPENEM 4 SENSITIVE Sensitive     NITROFURANTOIN 128 RESISTANT Resistant     TRIMETH/SULFA <=20 SENSITIVE Sensitive     AMPICILLIN/SULBACTAM <=2 SENSITIVE Sensitive     PIP/TAZO <=4 SENSITIVE Sensitive     * >=100,000 COLONIES/mL PROTEUS MIRABILIS  Culture, blood (Routine X 2) w Reflex to ID Panel     Status: None (Preliminary result)   Collection Time: 02/04/22  1:34 AM   Specimen: BLOOD  Result Value Ref Range Status   Specimen Description BLOOD SITE NOT SPECIFIED  Final    Special Requests   Final    BOTTLES DRAWN AEROBIC AND ANAEROBIC Blood Culture adequate volume   Culture   Final    NO GROWTH 2 DAYS Performed at [facility], [address] [place], [place] [address]    Report Status PENDING  Incomplete  Culture, blood (Routine X 2) w Reflex to ID Panel     Status: None (Preliminary result)   Collection Time: 02/04/22  4:13 AM   Specimen: BLOOD RIGHT HAND  Result Value Ref Range Status   Specimen Description BLOOD RIGHT HAND  Final   Special Requests   Final    BOTTLES DRAWN AEROBIC ONLY Blood Culture results may not be optimal due to an excessive volume of blood received in culture bottles   Culture   Final    NO GROWTH 2 DAYS Performed at [facility], [place] [address] [place], [place] [address]    Report Status PENDING  Incomplete     Discharge Instructions:   Discharge Instructions     Diet - low sodium heart healthy   Complete by: As directed    Increase activity slowly   Complete by: As directed       Allergies as of 02/06/2022       Reactions  Dilaudid [hydromorphone] Nausea And Vomiting   Nsaids Other (See Comments)   11/09/11-Avoid due to decreased liver function. Can tolerate low dose Aspirin    Prednisone Nausea And Vomiting        Medication List     STOP taking these medications    diphenhydramine-acetaminophen 25-500 MG Tabs tablet Commonly known as: TYLENOL PM   sulfamethoxazole-trimethoprim 800-160 MG tablet Commonly known as: BACTRIM DS       TAKE these medications    acetaminophen 325 MG tablet Commonly known as: TYLENOL Take 650 mg by mouth every 6 (six) hours as needed for moderate pain (pain).   alendronate 70 MG tablet Commonly known as: FOSAMAX Take 70 mg by mouth once a week. On Wednesdays. Take with a full glass of water on an empty stomach.   allopurinol 300 MG tablet Commonly known as: ZYLOPRIM Take 300 mg by mouth daily.   amLODipine 2.5 MG tablet Commonly known as:  NORVASC Take 2.5 mg by mouth 2 (two) times daily.   aspirin 81 MG chewable tablet Chew 81 mg by mouth 2 (two) times a week.   benzonatate 100 MG capsule Commonly known as: TESSALON Take 100 mg by mouth 3 (three) times daily as needed for cough.   cefdinir 300 MG capsule Commonly known as: OMNICEF Take 1 capsule (300 mg total) by mouth every 12 (twelve) hours.   ciclopirox 8 % solution Commonly known as: PENLAC Apply 1 Application topically daily. To left thumb nail and big toe on left foot   colchicine 0.6 MG tablet Take 0.6 mg by mouth as needed (for gout flare).   DRY EYE RELIEF OP Place 1-2 drops into both eyes daily as needed (Dry eyes).   esomeprazole 40 MG capsule Commonly known as: NEXIUM Take 40 mg by mouth daily.   furosemide 20 MG tablet Commonly known as: LASIX Take 20 mg by mouth daily as needed.   gabapentin 100 MG capsule Commonly known as: Neurontin Take 1 capsule (100 mg total) by mouth 3 (three) times daily.   losartan 100 MG tablet Commonly known as: COZAAR Take 100 mg by mouth daily.   montelukast 10 MG tablet Commonly known as: SINGULAIR Take 10 mg by mouth daily.   multivitamin with minerals Tabs tablet Take 1 tablet by mouth daily.   polycarbophil 625 MG tablet Commonly known as: FIBERCON Take 625 mg by mouth daily.   ProAir HFA 108 (90 Base) MCG/ACT inhaler Generic drug: albuterol Inhale 2 puffs into the lungs as needed for wheezing or shortness of breath.   promethazine 12.5 MG tablet Commonly known as: PHENERGAN Take 12.5 mg by mouth every 6 (six) hours as needed for nausea or vomiting.   Salonpas 3.05-08-08 % Ptch Generic drug: Camphor-Menthol-Methyl Sal Place 1 patch onto the skin daily as needed (Pain).   simvastatin 40 MG tablet Commonly known as: ZOCOR Take 1 tablet (40 mg total) by mouth every evening. Start taking on: February 12, 2022 What changed: These instructions start on February 12, 2022. If you are unsure what to  do until then, ask your doctor or other care provider.   solifenacin 10 MG tablet Commonly known as: VESICARE Take 10 mg by mouth daily.   traMADol 50 MG tablet Commonly known as: ULTRAM Take 50 mg by mouth every 6 (six) hours as needed for moderate pain or severe pain (pain).          Time coordinating discharge: 45 min  Signed:  Geradine Girt DO  [facility] 02/06/2022, 10:14 AM

## 2022-02-09 LAB — CULTURE, BLOOD (ROUTINE X 2)
Culture: NO GROWTH
Culture: NO GROWTH
Special Requests: ADEQUATE

## 2022-03-05 DIAGNOSIS — R051 Acute cough: Secondary | ICD-10-CM | POA: Diagnosis not present

## 2022-03-22 DIAGNOSIS — N3001 Acute cystitis with hematuria: Secondary | ICD-10-CM | POA: Diagnosis not present

## 2022-03-22 DIAGNOSIS — R300 Dysuria: Secondary | ICD-10-CM | POA: Diagnosis not present

## 2022-03-31 DIAGNOSIS — N390 Urinary tract infection, site not specified: Secondary | ICD-10-CM | POA: Diagnosis not present

## 2022-04-12 DIAGNOSIS — R053 Chronic cough: Secondary | ICD-10-CM | POA: Diagnosis not present

## 2022-04-12 DIAGNOSIS — N3281 Overactive bladder: Secondary | ICD-10-CM | POA: Diagnosis not present

## 2022-04-12 DIAGNOSIS — J4520 Mild intermittent asthma, uncomplicated: Secondary | ICD-10-CM | POA: Diagnosis not present

## 2022-04-12 DIAGNOSIS — N183 Chronic kidney disease, stage 3 unspecified: Secondary | ICD-10-CM | POA: Diagnosis not present

## 2022-04-12 DIAGNOSIS — I10 Essential (primary) hypertension: Secondary | ICD-10-CM | POA: Diagnosis not present

## 2022-04-12 DIAGNOSIS — M109 Gout, unspecified: Secondary | ICD-10-CM | POA: Diagnosis not present

## 2022-04-12 DIAGNOSIS — N390 Urinary tract infection, site not specified: Secondary | ICD-10-CM | POA: Diagnosis not present

## 2022-04-12 DIAGNOSIS — M810 Age-related osteoporosis without current pathological fracture: Secondary | ICD-10-CM | POA: Diagnosis not present

## 2022-04-12 DIAGNOSIS — R600 Localized edema: Secondary | ICD-10-CM | POA: Diagnosis not present

## 2022-04-12 DIAGNOSIS — Z853 Personal history of malignant neoplasm of breast: Secondary | ICD-10-CM | POA: Diagnosis not present

## 2022-04-18 NOTE — Progress Notes (Deleted)
[place]   [phone] [phone]) [phone]   Clinic Follow up Note   Patient Care Team: [name], MD as PCP - General (Family Medicine) Neldon Mc, MD (Inactive) as Surgeon (General Surgery) Nira Retort, MD as Consulting Physician (Hematology and Oncology) 04/18/2022  CHIEF COMPLAINT: Follow up left breast cancer   SUMMARY OF ONCOLOGIC HISTORY: Oncology History Overview Note  Breast cancer, left breast   Staging form: Breast, AJCC 7th Edition     Clinical stage from 12/07/2011: Stage IA (T1, N0, cM0) - Signed by Haywood Lasso, MD on 12/07/2011       Prognostic indicators: ER = 100%  PR = 100%  Ki67 = 12%  Her 2 equivocal on BX, 1.26 (no amp) on lumpectomy      Pathologic: Stage IA (T1c, N0, cM0) - Signed by Haywood Lasso, MD on 01/24/2012       Prognostic indicators: ER = 100%  PR = 100%  Ki67 = 12%  Her 2 equivocal on BX, 1.26 (no amp) on lumpectomy      Breast cancer of lower-outer quadrant of left female breast ([place])  12/07/2011 Initial Diagnosis   Breast cancer, left breast   01/02/2012 Surgery   lumpectomy    03/14/2012 - 05/02/2012 Radiation Therapy   35 radiation treatments under the direction of Dr. Sondra Come that was completed on     05/06/2012 - 04/2017 Anti-estrogen oral therapy   Arimidex 1 mg daily   03/16/2016 Mammogram   IMPRESSION: Stable left breast lumpectomy site. No mammographic evidence of malignancy in the bilateral breasts.   09/07/2016 Imaging   US Abdomen Complete IMPRESSION: Post cholecystectomy.  Common bile duct and liver unremarkable.   Slightly prominent pancreatic duct unchanged from 2013. No pancreatic mass identified (tail not visualized secondary to bowel gas).   Mild bilateral renal parenchymal thinning without hydronephrosis.   Aortic atherosclerosis.     CURRENT THERAPY: Surveillance   INTERVAL HISTORY: Melissa Morton returns for final visit, last seen by Dr. Burr Medico  04/21/21. She was hospitalized in 10/23, she was treated for PNA and UTI.    REVIEW OF SYSTEMS:   Constitutional: Denies fevers, chills or abnormal weight loss Eyes: Denies blurriness of vision Ears, nose, mouth, throat, and face: Denies mucositis or sore throat Respiratory: Denies cough, dyspnea or wheezes Cardiovascular: Denies palpitation, chest discomfort or lower extremity swelling Gastrointestinal:  Denies nausea, heartburn or change in bowel habits Skin: Denies abnormal skin rashes Lymphatics: Denies new lymphadenopathy or easy bruising Neurological:Denies numbness, tingling or new weaknesses Behavioral/Psych: Mood is stable, no new changes  All other systems were reviewed with the patient and are negative.  MEDICAL HISTORY:  Past Medical History:  Diagnosis Date   Allergy    seasonal allergies   Arthritis    knee and hands   Breast cancer, left breast ([place]) 12/07/2011   Breast cancer, left breast ([place]) 11/24/11   left breast 4 o'clock bx=invasive ductal ca,grade II,ER/PR=+   Diverticulitis    Full dentures    GERD (gastroesophageal reflux disease)    HOH (hard of hearing)    right ear    Hx of radiation therapy 03/14/12- 05/02/12   left breast 5040 gray in 28 fx, site of presentation lower outer quadrant boosted to 6440 gray   Hyperlipemia    Hypertension    Left knee DJD    PONV (postoperative nausea and vomiting)    has used scop patch-that helped   Reflux    Renal  insufficiency 11/09/2011   Shortness of breath    Wears glasses     SURGICAL HISTORY: Past Surgical History:  Procedure Laterality Date   ABDOMINAL HYSTERECTOMY  89   total   BLADDER SUSPENSION     BREAST LUMPECTOMY  01/04/2012   left,invasive ductcl ca, grade II,lloq, lymphovascular and perineural invaion ,DCIS,   CHOLECYSTECTOMY     EYE SURGERY     both cataracts   FRACTURE SURGERY     lt foot   KNEE SURGERY  2000   rt    TONSILLECTOMY     TOTAL KNEE ARTHROPLASTY Left 12/24/2013   Procedure:  LEFT TOTAL KNEE ARTHROPLASTY;  Surgeon: Lorn Junes, MD;  Location: [facility];  Service: Orthopedics;  Laterality: Left;   TUBAL LIGATION      I have reviewed the social history and family history with the patient and they are unchanged from previous note.  ALLERGIES:  is allergic to dilaudid [hydromorphone], nsaids, and prednisone.  MEDICATIONS:  Current Outpatient Medications  Medication Sig Dispense Refill   acetaminophen (TYLENOL) 325 MG tablet Take 650 mg by mouth every 6 (six) hours as needed for moderate pain (pain).      alendronate (FOSAMAX) 70 MG tablet Take 70 mg by mouth once a week. On Wednesdays. Take with a full glass of water on an empty stomach.     allopurinol (ZYLOPRIM) 300 MG tablet Take 300 mg by mouth daily.     amLODipine (NORVASC) 2.5 MG tablet Take 2.5 mg by mouth 2 (two) times daily.     aspirin 81 MG chewable tablet Chew 81 mg by mouth 2 (two) times a week.     benzonatate (TESSALON) 100 MG capsule Take 100 mg by mouth 3 (three) times daily as needed for cough.     Camphor-Menthol-Methyl Sal (SALONPAS) 3.05-08-08 % PTCH Place 1 patch onto the skin daily as needed (Pain).     Carboxymethylcellulose Sodium (DRY EYE RELIEF OP) Place 1-2 drops into both eyes daily as needed (Dry eyes).     cefdinir (OMNICEF) 300 MG capsule Take 1 capsule (300 mg total) by mouth every 12 (twelve) hours. 10 capsule 0   ciclopirox (PENLAC) 8 % solution Apply 1 Application topically daily. To left thumb nail and big toe on left foot     colchicine 0.6 MG tablet Take 0.6 mg by mouth as needed (for gout flare).      esomeprazole (NEXIUM) 40 MG capsule Take 40 mg by mouth daily.      furosemide (LASIX) 20 MG tablet Take 20 mg by mouth daily as needed.     gabapentin (NEURONTIN) 100 MG capsule Take 1 capsule (100 mg total) by mouth 3 (three) times daily. 20 capsule 1   losartan (COZAAR) 100 MG tablet Take 100 mg by mouth daily.     montelukast (SINGULAIR) 10 MG tablet Take 10 mg by mouth daily.      Multiple Vitamin (MULTIVITAMIN WITH MINERALS) TABS tablet Take 1 tablet by mouth daily.     polycarbophil (FIBERCON) 625 MG tablet Take 625 mg by mouth daily.     PROAIR HFA 108 (90 BASE) MCG/ACT inhaler Inhale 2 puffs into the lungs as needed for wheezing or shortness of breath.     promethazine (PHENERGAN) 12.5 MG tablet Take 12.5 mg by mouth every 6 (six) hours as needed for nausea or vomiting.     simvastatin (ZOCOR) 40 MG tablet Take 1 tablet (40 mg total) by mouth every evening. 30 tablet  solifenacin (VESICARE) 10 MG tablet Take 10 mg by mouth daily.     traMADol (ULTRAM) 50 MG tablet Take 50 mg by mouth every 6 (six) hours as needed for moderate pain or severe pain (pain).     No current facility-administered medications for this visit.    PHYSICAL EXAMINATION: ECOG PERFORMANCE STATUS: {CHL ONC ECOG [phone]}  There were no vitals filed for this visit. There were no vitals filed for this visit.  GENERAL:alert, no distress and comfortable SKIN: skin color, texture, turgor are normal, no rashes or significant lesions EYES: normal, Conjunctiva are pink and non-injected, sclera clear OROPHARYNX:no exudate, no erythema and lips, buccal mucosa, and tongue normal  NECK: supple, thyroid normal size, non-tender, without nodularity LYMPH:  no palpable lymphadenopathy in the cervical, axillary or inguinal LUNGS: clear to auscultation and percussion with normal breathing effort HEART: regular rate & rhythm and no murmurs and no lower extremity edema ABDOMEN:abdomen soft, non-tender and normal bowel sounds Musculoskeletal:no cyanosis of digits and no clubbing  NEURO: alert & oriented x 3 with fluent speech, no focal motor/sensory deficits  LABORATORY DATA:  I have reviewed the data as listed    Latest Ref Rng & Units 02/06/2022    2:52 AM 02/05/2022    3:47 AM 02/04/2022    1:30 AM  CBC  WBC 4.0 - 10.5 K/uL 9.6  11.5  20.8   Hemoglobin 12.0 - 15.0 g/dL 8.9  9.9  9.5    Hematocrit 36.0 - 46.0 % 26.9  29.8  30.8   Platelets 150 - 400 K/uL 211  199  229         Latest Ref Rng & Units 02/06/2022    2:52 AM 02/05/2022    3:47 AM 02/04/2022    1:30 AM  CMP  Glucose 70 - 99 mg/dL 105  106  117   BUN 8 - 23 mg/dL _0 Creatinine 0.44 - 1.00 mg/dL 1.27  1.36  1.31   Sodium 135 - 145 mmol/L 139  139  138   Potassium 3.5 - 5.1 mmol/L 4.4  4.2  4.0   Chloride 98 - 111 mmol/L 107  108  109   CO2 22 - 32 mmol/L _1 Calcium 8.9 - 10.3 mg/dL 9.2  9.2  8.1   Total Protein 6.5 - 8.1 g/dL  5.8  5.6   Total Bilirubin 0.3 - 1.2 mg/dL  0.8  1.4   Alkaline Phos 38 - 126 U/L  230  275   AST 15 - 41 U/L  146  553   ALT 0 - 44 U/L  171  284       RADIOGRAPHIC STUDIES: I have personally reviewed the radiological images as listed and agreed with the findings in the report. No results found.   ASSESSMENT & PLAN:  No problem-specific Assessment & Plan notes found for this encounter.   No orders of the defined types were placed in this encounter.  All questions were answered. The patient knows to call the clinic with any problems, questions or concerns. No barriers to learning was detected. I spent {CHL ONC TIME VISIT - HQRFX:[id number]} counseling the patient face to face. The total time spent in the appointment was {CHL ONC TIME VISIT - MEBRA:[id number]} and more than 50% was on counseling and review of test results     Alla Feeling, NP 04/18/22

## 2022-04-21 ENCOUNTER — Inpatient Hospital Stay: Payer: Medicare Other | Admitting: Nurse Practitioner

## 2022-04-21 ENCOUNTER — Inpatient Hospital Stay: Payer: Medicare Other

## 2022-04-28 DIAGNOSIS — N1832 Chronic kidney disease, stage 3b: Secondary | ICD-10-CM | POA: Diagnosis not present

## 2022-04-28 DIAGNOSIS — D649 Anemia, unspecified: Secondary | ICD-10-CM | POA: Diagnosis not present

## 2022-05-11 ENCOUNTER — Other Ambulatory Visit: Payer: Self-pay

## 2022-05-11 DIAGNOSIS — Z170 Estrogen receptor positive status [ER+]: Secondary | ICD-10-CM

## 2022-05-12 ENCOUNTER — Other Ambulatory Visit: Payer: Self-pay

## 2022-05-12 ENCOUNTER — Emergency Department (HOSPITAL_BASED_OUTPATIENT_CLINIC_OR_DEPARTMENT_OTHER): Payer: Medicare Other

## 2022-05-12 ENCOUNTER — Inpatient Hospital Stay: Payer: Medicare Other | Attending: Nurse Practitioner

## 2022-05-12 ENCOUNTER — Emergency Department (HOSPITAL_BASED_OUTPATIENT_CLINIC_OR_DEPARTMENT_OTHER): Payer: Medicare Other | Admitting: Radiology

## 2022-05-12 ENCOUNTER — Emergency Department (HOSPITAL_BASED_OUTPATIENT_CLINIC_OR_DEPARTMENT_OTHER)
Admission: EM | Admit: 2022-05-12 | Discharge: 2022-05-12 | Disposition: A | Discharge status: Home / Self Care | Payer: Medicare Other | Attending: Emergency Medicine | Admitting: Emergency Medicine

## 2022-05-12 ENCOUNTER — Inpatient Hospital Stay (HOSPITAL_BASED_OUTPATIENT_CLINIC_OR_DEPARTMENT_OTHER): Payer: Medicare Other | Admitting: Nurse Practitioner

## 2022-05-12 ENCOUNTER — Encounter: Payer: Self-pay | Admitting: Nurse Practitioner

## 2022-05-12 ENCOUNTER — Encounter (HOSPITAL_BASED_OUTPATIENT_CLINIC_OR_DEPARTMENT_OTHER): Payer: Self-pay

## 2022-05-12 VITALS — BP 136/68 | HR 82 | Temp 97.8°F | Resp 16 | Wt 158.7 lb

## 2022-05-12 DIAGNOSIS — M79651 Pain in right thigh: Secondary | ICD-10-CM

## 2022-05-12 DIAGNOSIS — I10 Essential (primary) hypertension: Secondary | ICD-10-CM | POA: Diagnosis not present

## 2022-05-12 DIAGNOSIS — I499 Cardiac arrhythmia, unspecified: Secondary | ICD-10-CM | POA: Diagnosis not present

## 2022-05-12 DIAGNOSIS — Z853 Personal history of malignant neoplasm of breast: Secondary | ICD-10-CM | POA: Insufficient documentation

## 2022-05-12 DIAGNOSIS — Z79899 Other long term (current) drug therapy: Secondary | ICD-10-CM | POA: Insufficient documentation

## 2022-05-12 DIAGNOSIS — M79661 Pain in right lower leg: Secondary | ICD-10-CM | POA: Diagnosis not present

## 2022-05-12 DIAGNOSIS — C50512 Malignant neoplasm of lower-outer quadrant of left female breast: Secondary | ICD-10-CM

## 2022-05-12 DIAGNOSIS — Z743 Need for continuous supervision: Secondary | ICD-10-CM | POA: Diagnosis not present

## 2022-05-12 DIAGNOSIS — N179 Acute kidney failure, unspecified: Secondary | ICD-10-CM

## 2022-05-12 DIAGNOSIS — R064 Hyperventilation: Secondary | ICD-10-CM | POA: Diagnosis not present

## 2022-05-12 DIAGNOSIS — Z170 Estrogen receptor positive status [ER+]: Secondary | ICD-10-CM

## 2022-05-12 DIAGNOSIS — Z7982 Long term (current) use of aspirin: Secondary | ICD-10-CM | POA: Insufficient documentation

## 2022-05-12 DIAGNOSIS — M1611 Unilateral primary osteoarthritis, right hip: Secondary | ICD-10-CM | POA: Diagnosis not present

## 2022-05-12 DIAGNOSIS — D649 Anemia, unspecified: Secondary | ICD-10-CM

## 2022-05-12 DIAGNOSIS — M1711 Unilateral primary osteoarthritis, right knee: Secondary | ICD-10-CM | POA: Diagnosis not present

## 2022-05-12 DIAGNOSIS — R6889 Other general symptoms and signs: Secondary | ICD-10-CM | POA: Diagnosis not present

## 2022-05-12 DIAGNOSIS — R0902 Hypoxemia: Secondary | ICD-10-CM | POA: Diagnosis not present

## 2022-05-12 LAB — URINALYSIS, ROUTINE W REFLEX MICROSCOPIC
Bilirubin Urine: NEGATIVE
Glucose, UA: NEGATIVE mg/dL
Hgb urine dipstick: NEGATIVE
Ketones, ur: NEGATIVE mg/dL
Nitrite: NEGATIVE
Protein, ur: NEGATIVE mg/dL
Specific Gravity, Urine: 1.009 (ref 1.005–1.030)
pH: 6 (ref 5.0–8.0)

## 2022-05-12 LAB — BASIC METABOLIC PANEL
Anion gap: 10 (ref 5–15)
BUN: 29 mg/dL — ABNORMAL HIGH (ref 8–23)
CO2: 25 mmol/L (ref 22–32)
Calcium: 9.3 mg/dL (ref 8.9–10.3)
Chloride: 101 mmol/L (ref 98–111)
Creatinine, Ser: 1.65 mg/dL — ABNORMAL HIGH (ref 0.44–1.00)
GFR, Estimated: 30 mL/min — ABNORMAL LOW (ref >60)
Glucose, Bld: 144 mg/dL — ABNORMAL HIGH (ref 70–99)
Potassium: 4.6 mmol/L (ref 3.5–5.1)
Sodium: 136 mmol/L (ref 135–145)

## 2022-05-12 LAB — CBC WITH DIFFERENTIAL (CANCER CENTER ONLY)
Abs Immature Granulocytes: 0.24 10*3/uL — ABNORMAL HIGH (ref 0.00–0.07)
Basophils Absolute: 0.1 10*3/uL (ref 0.0–0.1)
Basophils Relative: 1 %
Eosinophils Absolute: 0.3 10*3/uL (ref 0.0–0.5)
Eosinophils Relative: 2 %
HCT: 30.2 % — ABNORMAL LOW (ref 36.0–46.0)
Hemoglobin: 9.6 g/dL — ABNORMAL LOW (ref 12.0–15.0)
Immature Granulocytes: 2 %
Lymphocytes Relative: 21 %
Lymphs Abs: 2.5 10*3/uL (ref 0.7–4.0)
MCH: 29.1 pg (ref 26.0–34.0)
MCHC: 31.8 g/dL (ref 30.0–36.0)
MCV: 91.5 fL (ref 80.0–100.0)
Monocytes Absolute: 1.1 10*3/uL — ABNORMAL HIGH (ref 0.1–1.0)
Monocytes Relative: 9 %
Neutro Abs: 7.9 10*3/uL — ABNORMAL HIGH (ref 1.7–7.7)
Neutrophils Relative %: 65 %
Platelet Count: 339 10*3/uL (ref 150–400)
RBC: 3.3 MIL/uL — ABNORMAL LOW (ref 3.87–5.11)
RDW: 15.2 % (ref 11.5–15.5)
WBC Count: 12.1 10*3/uL — ABNORMAL HIGH (ref 4.0–10.5)
nRBC: 0.2 % (ref 0.0–0.2)

## 2022-05-12 LAB — CBC WITH DIFFERENTIAL/PLATELET
Abs Immature Granulocytes: 0.27 10*3/uL — ABNORMAL HIGH (ref 0.00–0.07)
Basophils Absolute: 0.1 10*3/uL (ref 0.0–0.1)
Basophils Relative: 0 %
Eosinophils Absolute: 0 10*3/uL (ref 0.0–0.5)
Eosinophils Relative: 0 %
HCT: 31.1 % — ABNORMAL LOW (ref 36.0–46.0)
Hemoglobin: 10 g/dL — ABNORMAL LOW (ref 12.0–15.0)
Immature Granulocytes: 2 %
Lymphocytes Relative: 9 %
Lymphs Abs: 1.1 10*3/uL (ref 0.7–4.0)
MCH: 28.9 pg (ref 26.0–34.0)
MCHC: 32.2 g/dL (ref 30.0–36.0)
MCV: 89.9 fL (ref 80.0–100.0)
Monocytes Absolute: 0.5 10*3/uL (ref 0.1–1.0)
Monocytes Relative: 4 %
Neutro Abs: 10.2 10*3/uL — ABNORMAL HIGH (ref 1.7–7.7)
Neutrophils Relative %: 85 %
Platelets: 307 10*3/uL (ref 150–400)
RBC: 3.46 MIL/uL — ABNORMAL LOW (ref 3.87–5.11)
RDW: 15.3 % (ref 11.5–15.5)
WBC: 12 10*3/uL — ABNORMAL HIGH (ref 4.0–10.5)
nRBC: 0.2 % (ref 0.0–0.2)

## 2022-05-12 LAB — CMP (CANCER CENTER ONLY)
ALT: 8 U/L (ref 0–44)
AST: 14 U/L — ABNORMAL LOW (ref 15–41)
Albumin: 3.8 g/dL (ref 3.5–5.0)
Alkaline Phosphatase: 79 U/L (ref 38–126)
Anion gap: 10 (ref 5–15)
BUN: 31 mg/dL — ABNORMAL HIGH (ref 8–23)
CO2: 27 mmol/L (ref 22–32)
Calcium: 9.4 mg/dL (ref 8.9–10.3)
Chloride: 104 mmol/L (ref 98–111)
Creatinine: 1.84 mg/dL — ABNORMAL HIGH (ref 0.44–1.00)
GFR, Estimated: 26 mL/min — ABNORMAL LOW (ref >60)
Glucose, Bld: 97 mg/dL (ref 70–99)
Potassium: 4.4 mmol/L (ref 3.5–5.1)
Sodium: 141 mmol/L (ref 135–145)
Total Bilirubin: 0.3 mg/dL (ref 0.3–1.2)
Total Protein: 7.1 g/dL (ref 6.5–8.1)

## 2022-05-12 LAB — CBG MONITORING, ED: Glucose-Capillary: 141 mg/dL — ABNORMAL HIGH (ref 70–99)

## 2022-05-12 NOTE — Discharge Instructions (Signed)
You were initially evaluated today for right-sided knee pain.  Images show degenerative changes in the hip and knee but no fractures or dislocation.  Please follow-up with Dr.Marchwiany for further evaluation of the pain. Your workup for syncope was grossly unremarkable.  You do have a mild "kidney injury".  Please be sure to consume plenty of water over the next week and follow-up with your primary care provider in 1 week for repeat kidney labs.  This is to check for resolution of this kidney injury.

## 2022-05-12 NOTE — ED Provider Notes (Signed)
  Physical Exam  BP 139/67 (BP Location: Right Arm)   Pulse 92   Temp (!) 97.5 F (36.4 C) (Oral)   Resp 16   SpO2 98%   Results  US Venous Img Lower Right (DVT Study)  Result Date: 05/12/2022 CLINICAL DATA:  Pain EXAM: RIGHT LOWER EXTREMITY VENOUS DOPPLER ULTRASOUND TECHNIQUE: Gray-scale sonography with compression, as well as color and duplex ultrasound, were performed to evaluate the deep venous system(s) from the level of the common femoral vein through the popliteal and proximal calf veins. COMPARISON:  None Available. FINDINGS: VENOUS Normal compressibility of the common femoral, superficial femoral, and popliteal veins, as well as the visualized calf veins. Visualized portions of profunda femoral vein and great saphenous vein unremarkable. No filling defects to suggest DVT on grayscale or color Doppler imaging. Doppler waveforms show normal direction of venous flow, normal respiratory plasticity and response to augmentation. Limited views of the contralateral common femoral vein are unremarkable. OTHER None. Limitations: none IMPRESSION: Negative. Electronically Signed   By: Davina Poke D.O.   On: 05/12/2022 19:09   ED Course / MDM    Medical Decision Making Amount and/or Complexity of Data Reviewed Labs: ordered. Radiology: ordered. ECG/medicine tests: ordered.  Accepted handoff at shift change from [facility] Please see prior provider note for full HPI.  Briefly: Patient is a 89 y.o. female who presents to the ER for right knee and thigh pain. Daughter at bedside referenced near syncopal episodes for several days.  DDX/Plan: Follow up on ultrasound imaging to rule out DVT. If unremarkable, discharge to home with orthopedic follow up (Dr Zachery Dakins).  After consideration of the diagnostic results and the patients response to treatment, I feel that emergency department workup does not suggest an emergent condition requiring admission or immediate intervention beyond what  has been performed at this time. The plan is: discharge to home with orthopedic follow up. The patient is safe for discharge and has been instructed to return immediately for worsening symptoms, change in symptoms or any other concerns.     Melissa Morton 05/12/22 Melissa Morrow, MD 05/14/22 1054

## 2022-05-12 NOTE — Progress Notes (Signed)
Patient Care Team: [name], MD as PCP - General (Family Medicine) Neldon Mc, MD (Inactive) as Surgeon (General Surgery) Odogwu, Genevie Cheshire, MD as Consulting Physician (Hematology and Oncology)   CHIEF COMPLAINT: Follow up left breast cancer   Oncology History Overview Note  Breast cancer, left breast   Staging form: Breast, AJCC 7th Edition     Clinical stage from 12/07/2011: Stage IA (T1, N0, cM0) - Signed by Haywood Lasso, MD on 12/07/2011       Prognostic indicators: ER = 100%  PR = 100%  Ki67 = 12%  Her 2 equivocal on BX, 1.26 (no amp) on lumpectomy      Pathologic: Stage IA (T1c, N0, cM0) - Signed by Haywood Lasso, MD on 01/24/2012       Prognostic indicators: ER = 100%  PR = 100%  Ki67 = 12%  Her 2 equivocal on BX, 1.26 (no amp) on lumpectomy      Breast cancer of lower-outer quadrant of left female breast ([place])  12/07/2011 Initial Diagnosis   Breast cancer, left breast   01/02/2012 Surgery   lumpectomy    03/14/2012 - 05/02/2012 Radiation Therapy   35 radiation treatments under the direction of Dr. Sondra Come that was completed on     05/06/2012 - 04/2017 Anti-estrogen oral therapy   Arimidex 1 mg daily   03/16/2016 Mammogram   IMPRESSION: Stable left breast lumpectomy site. No mammographic evidence of malignancy in the bilateral breasts.   09/07/2016 Imaging   US Abdomen Complete IMPRESSION: Post cholecystectomy.  Common bile duct and liver unremarkable.   Slightly prominent pancreatic duct unchanged from 2013. No pancreatic mass identified (tail not visualized secondary to bowel gas).   Mild bilateral renal parenchymal thinning without hydronephrosis.   Aortic atherosclerosis.      CURRENT THERAPY: Surveillance   INTERVAL HISTORY Melissa Morton returns for follow up as scheduled. Last seen by Dr. Burr Medico 04/21/21. Mammogram 2/23 was abnormal, biopsy was negative. She is scheduled for next mammo in 06/2022.  Denies new lump/mass, nipple  discharge or inversion, or skin change.  She was hospitlaized in 01/2022 for UTI and PNA. She has had 2 subsequent UTIs since then and her daughter thinks she still has it. They are waiting to see urology. She has right knee pain and occasional mid back pain when she is active/cleaning.  She has been on oral iron for about a week, per PCP.  ROS  All other systems reviewed and are negative   Past Medical History:  Diagnosis Date   Allergy    seasonal allergies   Arthritis    knee and hands   Breast cancer, left breast ([place]) 12/07/2011   Breast cancer, left breast ([place]) 11/24/11   left breast 4 o'clock bx=invasive ductal ca,grade II,ER/PR=+   Diverticulitis    Full dentures    GERD (gastroesophageal reflux disease)    HOH (hard of hearing)    right ear    Hx of radiation therapy 03/14/12- 05/02/12   left breast 5040 gray in 28 fx, site of presentation lower outer quadrant boosted to 6440 gray   Hyperlipemia    Hypertension    Left knee DJD    PONV (postoperative nausea and vomiting)    has used scop patch-that helped   Reflux    Renal insufficiency 11/09/2011   Shortness of breath    Wears glasses      Past Surgical History:  Procedure Laterality Date   ABDOMINAL HYSTERECTOMY  93  total   BLADDER SUSPENSION     BREAST LUMPECTOMY  01/04/2012   left,invasive ductcl ca, grade II,lloq, lymphovascular and perineural invaion ,DCIS,   CHOLECYSTECTOMY     EYE SURGERY     both cataracts   FRACTURE SURGERY     lt foot   KNEE SURGERY  2000   rt    TONSILLECTOMY     TOTAL KNEE ARTHROPLASTY Left 12/24/2013   Procedure: LEFT TOTAL KNEE ARTHROPLASTY;  Surgeon: Lorn Junes, MD;  Location: [facility];  Service: Orthopedics;  Laterality: Left;   TUBAL LIGATION       Outpatient Encounter Medications as of 05/12/2022  Medication Sig Note   acetaminophen (TYLENOL) 325 MG tablet Take 650 mg by mouth every 6 (six) hours as needed for moderate pain (pain).     alendronate (FOSAMAX) 70 MG tablet  Take 70 mg by mouth once a week. On Wednesdays. Take with a full glass of water on an empty stomach.    allopurinol (ZYLOPRIM) 300 MG tablet Take 300 mg by mouth daily.    amLODipine (NORVASC) 2.5 MG tablet Take 2.5 mg by mouth 2 (two) times daily.    aspirin 81 MG chewable tablet Chew 81 mg by mouth 2 (two) times a week.    Bacillus Coagulans-Inulin (ALIGN PREBIOTIC-PROBIOTIC PO) Take by mouth.    benzonatate (TESSALON) 100 MG capsule Take 100 mg by mouth 3 (three) times daily as needed for cough.    calcium-vitamin D (OSCAL WITH D) 500-5 MG-MCG tablet Take 1 tablet by mouth.    Camphor-Menthol-Methyl Sal (SALONPAS) 3.05-08-08 % PTCH Place 1 patch onto the skin daily as needed (Pain).    Carboxymethylcellulose Sodium (DRY EYE RELIEF OP) Place 1-2 drops into both eyes daily as needed (Dry eyes).    ciclopirox (PENLAC) 8 % solution Apply 1 Application topically daily. To left thumb nail and big toe on left foot    colchicine 0.6 MG tablet Take 0.6 mg by mouth as needed (for gout flare).  02/04/2022: As needed    esomeprazole (NEXIUM) 40 MG capsule Take 40 mg by mouth daily.     ferrous sulfate 325 (65 FE) MG EC tablet Take 325 mg by mouth 3 (three) times daily with meals.    furosemide (LASIX) 20 MG tablet Take 20 mg by mouth daily as needed.    gabapentin (NEURONTIN) 100 MG capsule Take 1 capsule (100 mg total) by mouth 3 (three) times daily.    losartan (COZAAR) 100 MG tablet Take 100 mg by mouth daily.    montelukast (SINGULAIR) 10 MG tablet Take 10 mg by mouth daily.    Multiple Vitamin (MULTIVITAMIN WITH MINERALS) TABS tablet Take 1 tablet by mouth daily.    PROAIR HFA 108 (90 BASE) MCG/ACT inhaler Inhale 2 puffs into the lungs as needed for wheezing or shortness of breath.    promethazine (PHENERGAN) 12.5 MG tablet Take 12.5 mg by mouth every 6 (six) hours as needed for nausea or vomiting.    simvastatin (ZOCOR) 40 MG tablet Take 1 tablet (40 mg total) by mouth every evening.    solifenacin  (VESICARE) 10 MG tablet Take 10 mg by mouth daily.    traMADol (ULTRAM) 50 MG tablet Take 50 mg by mouth every 6 (six) hours as needed for moderate pain or severe pain (pain).    cefdinir (OMNICEF) 300 MG capsule Take 1 capsule (300 mg total) by mouth every 12 (twelve) hours. (Patient not taking: Reported on 05/12/2022)  polycarbophil (FIBERCON) 625 MG tablet Take 625 mg by mouth daily. (Patient not taking: Reported on 05/12/2022)    No facility-administered encounter medications on file as of 05/12/2022.     Today's Vitals   05/12/22 1049 05/12/22 1107  BP: 136/68   Pulse: 82   Resp: 16   Temp: 97.8 F (36.6 C)   SpO2: 100%   Weight: 158 lb 11.2 oz (72 kg)   PainSc:  4    Body mass index is 28.11 kg/m.   PHYSICAL EXAM GENERAL:alert, no distress and comfortable SKIN: no rash  EYES: sclera clear NECK: without mass LYMPH:  no palpable cervical or supraclavicular lymphadenopathy  LUNGS: clear with normal breathing effort HEART: regular rate & rhythm, mild bilateral lower extremity edema ABDOMEN: abdomen soft, non-tender and normal bowel sounds NEURO: alert & oriented x 3 with fluent speech Breast exam: No bilateral nipple discharge or inversion.  S/p left lumpectomy, incisions completely healed with mild scar tissue.  No palpable mass or nodularity in either breast or axilla that I could appreciate.   CBC    Component Value Date/Time   WBC 12.1 (H) 05/12/2022 1032   WBC 9.6 02/06/2022 0252   RBC 3.30 (L) 05/12/2022 1032   HGB 9.6 (L) 05/12/2022 1032   HGB 12.7 12/03/2016 0958   HCT 30.2 (L) 05/12/2022 1032   HCT 39.9 12/03/2016 0958   PLT 339 05/12/2022 1032   PLT 236 12/03/2016 0958   MCV 91.5 05/12/2022 1032   MCV 93.9 12/03/2016 0958   MCH 29.1 05/12/2022 1032   MCHC 31.8 05/12/2022 1032   RDW 15.2 05/12/2022 1032   RDW 15.2 (H) 12/03/2016 0958   LYMPHSABS 2.5 05/12/2022 1032   LYMPHSABS 1.9 12/03/2016 0958   MONOABS 1.1 (H) 05/12/2022 1032   MONOABS 0.7  12/03/2016 0958   EOSABS 0.3 05/12/2022 1032   EOSABS 0.2 12/03/2016 0958   BASOSABS 0.1 05/12/2022 1032   BASOSABS 0.1 12/03/2016 0958     CMP     Component Value Date/Time   NA 141 05/12/2022 1032   NA 145 12/03/2016 0958   K 4.4 05/12/2022 1032   K 4.9 12/03/2016 0958   CL 104 05/12/2022 1032   CL 106 09/26/2012 1418   CO2 27 05/12/2022 1032   CO2 28 12/03/2016 0958   GLUCOSE 97 05/12/2022 1032   GLUCOSE 106 12/03/2016 0958   GLUCOSE 142 (H) 09/26/2012 1418   BUN 31 (H) 05/12/2022 1032   BUN 18.7 12/03/2016 0958   CREATININE 1.84 (H) 05/12/2022 1032   CREATININE 1.4 (H) 12/03/2016 0958   CALCIUM 9.4 05/12/2022 1032   CALCIUM 9.9 12/03/2016 0958   PROT 7.1 05/12/2022 1032   PROT 6.3 (L) 12/03/2016 0958   ALBUMIN 3.8 05/12/2022 1032   ALBUMIN 3.7 12/03/2016 0958   AST 14 (L) 05/12/2022 1032   AST 17 12/03/2016 0958   ALT 8 05/12/2022 1032   ALT 11 12/03/2016 0958   ALKPHOS 79 05/12/2022 1032   ALKPHOS 81 12/03/2016 0958   BILITOT 0.3 05/12/2022 1032   BILITOT 0.50 12/03/2016 0958   GFRNONAA 26 (L) 05/12/2022 1032   GFRAA 50 (L) 06/25/2019 1055     ASSESSMENT & PLAN:Melissa Morton is a 89 y.o. female with      1. Breast cancer of lower-outer quadrant of left female breast, pT1cN0M0, stage I, ER+/PR+, HER2- -She was diagnosed in 12/2011. She is s/p left breast lumpectomy, adjuvant radiation and 5 years of anti-estrogen therapy with Anastrozole.  -Last mammogram  06/2021 showed grouped calcifications in the right breast, biopsy was benign and concordant.  Repeat mammogram scheduled next month -Melissa Morton is clinically doing well from a breast cancer standpoint.  Exam is benign, labs are stable.  No clinical concern for recurrence -She is over 10 years from definitive treatment. OK to follow-up with PCP with annual mammogram and breast exam -Follow-up open   2. Hospitalization: UTI (recurrent) and PNA -Hospitalized in 02/19/2022 for pneumonia and UTI.  She has  subsequently had 2 additional UTIs since then -Daughter still thinks she has an infection today.  She took a urine sample to PCP -WBC 12.1 -Urology appointment is pending  3. Anemia -Onset 06/2019 Hgb 11.9, worsened during hospitalization in 01/2022 for acute infections (PNA and UTIs) down to 8.9 -PCP started oral iron a week ago -She has not had full anemia workup that I can see, but this is likely multifactorial, from recent infections, possible iron deficiency, and likely a component of anemia of chronic disease -If this is anemia of chronic disease and worsens in the future, she would likely be a candidate for ESA if needed -Follow-up with PCP, I will see her back as needed in the future  4. Osteoporosis  -Osteopenia in 2015, and 04/2016 DEXA showed osteoporosis of Left distal radius, T score -2.5. -DEXA in 07/2018 mostly stable, Continue calcium, vitamin D and Fosamax. -per PCP   5. Hypertension, arthritis  -per PCP -R knee more painful lately, gets cortisone injections -Encouraged cane/walker   6. Skin cancer of anterior lower left leg, tip of nose -F/u dermatology     PLAN: -Last mammogram/path, recent hospital course, and today's labs reviewed -Continue breast cancer surveillance with PCP and annual mammograms, next due 06/2022 -Anemia and other co-morbidity monitoring per PCP -F/up open, will see her back if needed in the future for breast cancer concerns or worsening anemia    All questions were answered. The patient knows to call the clinic with any problems, questions or concerns. No barriers to learning were detected. I spent 25 minutes counseling the patient face to face. The total time spent in the appointment was 40 minutes and more than 50% was on counseling, review of test results, and coordination of care.   Cira Rue, NP-C 05/12/2022

## 2022-05-12 NOTE — ED Triage Notes (Signed)
Per EMS, Pt, from home, c/o R knee and thigh pain.  Hx of arthritis.  Pt reports pain increased while walking today.  Pt normally wears a brace on the knee and reports the pain increased when she removed the brace.      66mg Fentanyl given en route.

## 2022-05-12 NOTE — ED Provider Notes (Signed)
[facility] EMERGENCY DEPT Provider Note   CSN: [id number] Arrival date & time: 05/12/22  1639     History  Chief Complaint  Patient presents with   Knee Pain    Melissa Morton is a 89 y.o. female.  Patient presents the emergency department via EMS complaining of right knee and thigh pain.  Patient states that while walking today she had increased pain, mostly in the medial right thigh.  Patient's daughter is at bedside and reports that for the past 2 or so days the patient has seemed shakier than usual.  She recently traveled to and from [facility] by car.  The patient denies shortness of breath, chest pain, abdominal pain, nausea, vomiting.  She endorses increased right leg pain.  She does have a history of arthritis in the right knee and has had previous injections.  Also has history of bursitis in the right hip.  The patient recently finished a second course of antibiotics due to a likely UTI.  She is denying urinary symptoms at this time.  Past medical history significant for remote arthroscopic surgery of the right knee, arthritis, breast cancer of the left side in remission, hypertension, diverticulitis, GERD, renal insufficiency  HPI     Home Medications Prior to Admission medications   Medication Sig Start Date End Date Taking? Authorizing Provider  acetaminophen (TYLENOL) 325 MG tablet Take 650 mg by mouth every 6 (six) hours as needed for moderate pain (pain).     [provider]  alendronate (FOSAMAX) 70 MG tablet Take 70 mg by mouth once a week. On Wednesdays. Take with a full glass of water on an empty stomach.    [provider]  allopurinol (ZYLOPRIM) 300 MG tablet Take 300 mg by mouth daily.    [provider]  amLODipine (NORVASC) 2.5 MG tablet Take 2.5 mg by mouth 2 (two) times daily. 01/30/22   [provider]  aspirin 81 MG chewable tablet Chew 81 mg by mouth 2 (two) times a week.    [provider]  Bacillus  Coagulans-Inulin (ALIGN PREBIOTIC-PROBIOTIC PO) Take by mouth.    [provider]  benzonatate (TESSALON) 100 MG capsule Take 100 mg by mouth 3 (three) times daily as needed for cough. 01/28/22   [provider]  calcium-vitamin D (OSCAL WITH D) 500-5 MG-MCG tablet Take 1 tablet by mouth.    [provider]  Camphor-Menthol-Methyl Sal (SALONPAS) 3.05-08-08 % PTCH Place 1 patch onto the skin daily as needed (Pain).    [provider]  Carboxymethylcellulose Sodium (DRY EYE RELIEF OP) Place 1-2 drops into both eyes daily as needed (Dry eyes).    [provider]  cefdinir (OMNICEF) 300 MG capsule Take 1 capsule (300 mg total) by mouth every 12 (twelve) hours. Patient not taking: Reported on 05/12/2022 02/06/22   Geradine Girt, DO  ciclopirox (PENLAC) 8 % solution Apply 1 Application topically daily. To left thumb nail and big toe on left foot 01/20/22   [provider]  colchicine 0.6 MG tablet Take 0.6 mg by mouth as needed (for gout flare).     [provider]  esomeprazole (NEXIUM) 40 MG capsule Take 40 mg by mouth daily.  10/01/14   [provider]  ferrous sulfate 325 (65 FE) MG EC tablet Take 325 mg by mouth 3 (three) times daily with meals.    [provider]  furosemide (LASIX) 20 MG tablet Take 20 mg by mouth daily as needed. 01/22/22  [provider]  gabapentin (NEURONTIN) 100 MG capsule Take 1 capsule (100 mg total) by mouth 3 (three) times daily. 01/29/20   Tasia Catchings, Amy V, PA-C  losartan (COZAAR) 100 MG tablet Take 100 mg by mouth daily.    [provider]  montelukast (SINGULAIR) 10 MG tablet Take 10 mg by mouth daily. 12/23/21   [provider]  Multiple Vitamin (MULTIVITAMIN WITH MINERALS) TABS tablet Take 1 tablet by mouth daily.    [provider]  polycarbophil (FIBERCON) 625 MG tablet Take 625 mg by mouth daily. Patient not taking: Reported on 05/12/2022    [provider]  PROAIR HFA 108 (90 BASE) MCG/ACT inhaler Inhale 2 puffs into the lungs as needed for wheezing or shortness of breath. 02/04/15   [provider]  promethazine (PHENERGAN) 12.5 MG tablet Take 12.5 mg by mouth every 6 (six) hours as needed for nausea or vomiting. 06/18/19   [provider]  simvastatin (ZOCOR) 40 MG tablet Take 1 tablet (40 mg total) by mouth every evening. 02/12/22   Geradine Girt, DO  solifenacin (VESICARE) 10 MG tablet Take 10 mg by mouth daily.    [provider]  traMADol (ULTRAM) 50 MG tablet Take 50 mg by mouth every 6 (six) hours as needed for moderate pain or severe pain (pain).    [provider]      Allergies    Dilaudid [hydromorphone], Nsaids, and Prednisone    Review of Systems   Review of Systems  Constitutional:  Negative for fever.  Respiratory:  Negative for shortness of breath.   Cardiovascular:  Negative for chest pain.  Gastrointestinal:  Negative for abdominal pain.  Genitourinary:  Negative for dysuria.  Musculoskeletal:  Positive for arthralgias and myalgias.  Neurological:  Negative for light-headedness.    Physical Exam Updated Vital Signs BP 139/67 (BP Location: Right Arm)   Pulse 92   Temp (!) 97.5 F (36.4 C) (Oral)   Resp 16   SpO2 98%  Physical Exam Vitals and nursing note reviewed.  Constitutional:      General: She is not in acute distress.    Appearance: She is well-developed.  HENT:     Head: Normocephalic and atraumatic.     Mouth/Throat:     Mouth: Mucous membranes are moist.  Eyes:     Conjunctiva/sclera: Conjunctivae normal.  Cardiovascular:     Rate and Rhythm: Normal rate and regular rhythm.     Heart sounds: No murmur heard. Pulmonary:     Effort: Pulmonary effort is normal. No respiratory distress.     Breath sounds: Normal breath sounds.  Abdominal:     Palpations: Abdomen is soft.     Tenderness: There is no abdominal tenderness.  Musculoskeletal:        General:  Tenderness present. No swelling or signs of injury. Normal range of motion.     Cervical back: Neck supple.     Right lower leg: No edema.     Left lower leg: No edema.     Comments: Tenderness to medial right thigh.  Tenderness over the right trochanter and over the pes anserine insertion  Skin:    General: Skin is warm and dry.     Capillary Refill: Capillary refill takes less than 2 seconds.  Neurological:     General: No focal deficit present.     Mental Status: She is alert.     Sensory: No sensory deficit.     Motor: No  weakness.     Gait: Gait normal.  Psychiatric:        Mood and Affect: Mood normal.     ED Results / Procedures / Treatments   Labs (all labs ordered are listed, but only abnormal results are displayed) Labs Reviewed  CBG MONITORING, ED - Abnormal; Notable for the following components:      Result Value   Glucose-Capillary 141 (*)    All other components within normal limits  BASIC METABOLIC PANEL  CBC WITH DIFFERENTIAL/PLATELET  URINALYSIS, ROUTINE W REFLEX MICROSCOPIC    EKG None  Radiology DG Hip Unilat W or Wo Pelvis 2-3 Views Right  Result Date: 05/12/2022 CLINICAL DATA:  Knee and thigh pain EXAM: DG HIP (WITH OR WITHOUT PELVIS) 3V RIGHT COMPARISON:  01/30/2020 FINDINGS: Hyperostosis. Osteopenia. Mild sclerosis and hypertrophic changes along the pubic symphysis. Minimal osteophytes along the sacroiliac joints. No acute fracture or dislocation. Note is made of scattered vascular calcifications along the pelvis. Moderate colonic stool. Osteophytes along the lumbar spine at the edge of the imaging field. IMPRESSION: Degenerative changes identified.  No acute osseous abnormality Electronically Signed   By: Jill Side M.D.   On: 05/12/2022 17:34   DG Knee Complete 4 Views Right  Result Date: 05/12/2022 CLINICAL DATA:  Pain. EXAM: RIGHT KNEE - COMPLETE 4 VIEW COMPARISON:  None Available. FINDINGS: Mild joint space loss of all 3 compartments with small  osteophytes. No fracture or dislocation. Preserved bone mineralization. No joint effusion on lateral view. Mild hyperostosis. IMPRESSION: Tricompartmental degenerative changes. Electronically Signed   By: Jill Side M.D.   On: 05/12/2022 17:33    Procedures Procedures    Medications Ordered in ED Medications - No data to display  ED Course/ Medical Decision Making/ A&P                           Medical Decision Making Amount and/or Complexity of Data Reviewed Labs: ordered. Radiology: ordered. ECG/medicine tests: ordered.   This patient presents to the ED for concern of right-sided lower extremity pain and near syncopal episodes, this involves an extensive number of treatment options, and is a complaint that carries with it a high risk of complications and morbidity.  The differential diagnosis includes electrolyte abnormalities, dehydration, vasovagal response, and others for the syncopal portion.  Differential for the lower extremity pain includes DVT, fracture, dislocation, and others   Co morbidities that complicate the patient evaluation  Hypertension, breast cancer, GERD, renal insufficiency   Additional history obtained:  Additional history obtained from patient's daughter External records from outside source obtained and reviewed including discharge summary from October 7 when the patient was admitted due to a UTI   Lab Tests:  I Ordered, and personally interpreted labs.  The pertinent results include: UA nitrite negative with rare bacteria and moderate leukocytes; creatinine 1.65 (baseline appears to be around 1.3); WBC 12.0   Imaging Studies ordered:  I ordered imaging studies including plain films of the right hip and knee, DVT study of the right lower extremity I independently visualized and interpreted imaging which showed degenerative changes in the right hip and knee.  No acute fractures or dislocations.  DVT study pending I agree with the radiologist  interpretation   Cardiac Monitoring: / EKG:  The patient was maintained on a cardiac monitor.  I personally viewed and interpreted the cardiac monitored which showed an underlying rhythm of: Sinus rhythm   Test / Admission - Considered:  Patient has a very mild AKI at this time patient will make sure she hydrates appropriately at home and will follow-up in 1 week with her primary care provider for recheck of her metabolic panel to check for resolution of AKI.  Imaging of the right knee and hip were grossly unremarkable other than degenerative changes.  Plan to have patient follow-up with Dr. Zachery Dakins as he has previously treated her for right knee pain.  Discharge pending DVT study.  Patient care being transferred to [place], PA-C at shift handoff.  If DVT study is negative plan on discharge home.  If studies positive plan to start anticoagulation with likely discharge home.        Final Clinical Impression(s) / ED Diagnoses Final diagnoses:  None    Rx / DC Orders ED Discharge Orders     None         Ronny Bacon 05/12/22 1857    Dorie Rank, MD 05/14/22 1054

## 2022-05-13 DIAGNOSIS — M25551 Pain in right hip: Secondary | ICD-10-CM | POA: Diagnosis not present

## 2022-05-13 DIAGNOSIS — M25561 Pain in right knee: Secondary | ICD-10-CM | POA: Diagnosis not present

## 2022-06-01 DIAGNOSIS — N3020 Other chronic cystitis without hematuria: Secondary | ICD-10-CM | POA: Diagnosis not present

## 2022-06-01 DIAGNOSIS — M25561 Pain in right knee: Secondary | ICD-10-CM | POA: Diagnosis not present

## 2022-06-01 DIAGNOSIS — R338 Other retention of urine: Secondary | ICD-10-CM | POA: Diagnosis not present

## 2022-06-28 DIAGNOSIS — Z1231 Encounter for screening mammogram for malignant neoplasm of breast: Secondary | ICD-10-CM | POA: Diagnosis not present

## 2022-06-30 DIAGNOSIS — R8290 Unspecified abnormal findings in urine: Secondary | ICD-10-CM | POA: Diagnosis not present

## 2022-06-30 DIAGNOSIS — R195 Other fecal abnormalities: Secondary | ICD-10-CM | POA: Diagnosis not present

## 2022-06-30 DIAGNOSIS — R1011 Right upper quadrant pain: Secondary | ICD-10-CM | POA: Diagnosis not present

## 2022-06-30 DIAGNOSIS — R109 Unspecified abdominal pain: Secondary | ICD-10-CM | POA: Diagnosis not present

## 2022-06-30 DIAGNOSIS — R350 Frequency of micturition: Secondary | ICD-10-CM | POA: Diagnosis not present

## 2022-07-01 DIAGNOSIS — M810 Age-related osteoporosis without current pathological fracture: Secondary | ICD-10-CM | POA: Diagnosis not present

## 2022-07-01 DIAGNOSIS — I10 Essential (primary) hypertension: Secondary | ICD-10-CM | POA: Diagnosis not present

## 2022-07-01 DIAGNOSIS — N183 Chronic kidney disease, stage 3 unspecified: Secondary | ICD-10-CM | POA: Diagnosis not present

## 2022-07-01 DIAGNOSIS — E7800 Pure hypercholesterolemia, unspecified: Secondary | ICD-10-CM | POA: Diagnosis not present

## 2022-07-01 DIAGNOSIS — J4520 Mild intermittent asthma, uncomplicated: Secondary | ICD-10-CM | POA: Diagnosis not present

## 2022-07-01 DIAGNOSIS — K219 Gastro-esophageal reflux disease without esophagitis: Secondary | ICD-10-CM | POA: Diagnosis not present

## 2022-07-02 DIAGNOSIS — M25561 Pain in right knee: Secondary | ICD-10-CM | POA: Diagnosis not present

## 2022-07-07 ENCOUNTER — Telehealth: Payer: Self-pay

## 2022-07-07 DIAGNOSIS — Z780 Asymptomatic menopausal state: Secondary | ICD-10-CM | POA: Diagnosis not present

## 2022-07-07 DIAGNOSIS — M85851 Other specified disorders of bone density and structure, right thigh: Secondary | ICD-10-CM | POA: Diagnosis not present

## 2022-07-07 DIAGNOSIS — M810 Age-related osteoporosis without current pathological fracture: Secondary | ICD-10-CM | POA: Diagnosis not present

## 2022-07-07 NOTE — Telephone Encounter (Signed)
Faxed signed Surgical Clearance back to [facility] for Dr. Charlies Constable per Dr. Burr Medico. Placed original in patients chart to be scanned, received fax conformation that it was received.

## 2022-07-24 ENCOUNTER — Emergency Department (HOSPITAL_COMMUNITY): Payer: 59

## 2022-07-24 ENCOUNTER — Other Ambulatory Visit: Payer: Self-pay

## 2022-07-24 ENCOUNTER — Encounter (HOSPITAL_COMMUNITY): Payer: Self-pay

## 2022-07-24 ENCOUNTER — Inpatient Hospital Stay (HOSPITAL_COMMUNITY)
Admission: EM | Admit: 2022-07-24 | Discharge: 2022-07-27 | DRG: 871 | Disposition: A | Discharge status: Home / Self Care | Payer: 59 | Attending: Internal Medicine | Admitting: Internal Medicine

## 2022-07-24 DIAGNOSIS — E860 Dehydration: Secondary | ICD-10-CM | POA: Diagnosis not present

## 2022-07-24 DIAGNOSIS — J4520 Mild intermittent asthma, uncomplicated: Secondary | ICD-10-CM | POA: Diagnosis present

## 2022-07-24 DIAGNOSIS — E785 Hyperlipidemia, unspecified: Secondary | ICD-10-CM | POA: Diagnosis present

## 2022-07-24 DIAGNOSIS — R6520 Severe sepsis without septic shock: Secondary | ICD-10-CM | POA: Diagnosis present

## 2022-07-24 DIAGNOSIS — Z801 Family history of malignant neoplasm of trachea, bronchus and lung: Secondary | ICD-10-CM

## 2022-07-24 DIAGNOSIS — Z7983 Long term (current) use of bisphosphonates: Secondary | ICD-10-CM | POA: Diagnosis not present

## 2022-07-24 DIAGNOSIS — E7800 Pure hypercholesterolemia, unspecified: Secondary | ICD-10-CM | POA: Diagnosis not present

## 2022-07-24 DIAGNOSIS — R6889 Other general symptoms and signs: Secondary | ICD-10-CM | POA: Diagnosis not present

## 2022-07-24 DIAGNOSIS — Z87891 Personal history of nicotine dependence: Secondary | ICD-10-CM | POA: Diagnosis not present

## 2022-07-24 DIAGNOSIS — A419 Sepsis, unspecified organism: Secondary | ICD-10-CM | POA: Diagnosis not present

## 2022-07-24 DIAGNOSIS — D3502 Benign neoplasm of left adrenal gland: Secondary | ICD-10-CM | POA: Diagnosis present

## 2022-07-24 DIAGNOSIS — Z96652 Presence of left artificial knee joint: Secondary | ICD-10-CM | POA: Diagnosis not present

## 2022-07-24 DIAGNOSIS — M109 Gout, unspecified: Secondary | ICD-10-CM | POA: Diagnosis present

## 2022-07-24 DIAGNOSIS — R0902 Hypoxemia: Secondary | ICD-10-CM | POA: Diagnosis not present

## 2022-07-24 DIAGNOSIS — M810 Age-related osteoporosis without current pathological fracture: Secondary | ICD-10-CM | POA: Diagnosis not present

## 2022-07-24 DIAGNOSIS — Z923 Personal history of irradiation: Secondary | ICD-10-CM | POA: Diagnosis not present

## 2022-07-24 DIAGNOSIS — R5383 Other fatigue: Secondary | ICD-10-CM | POA: Diagnosis not present

## 2022-07-24 DIAGNOSIS — N1832 Chronic kidney disease, stage 3b: Secondary | ICD-10-CM | POA: Diagnosis not present

## 2022-07-24 DIAGNOSIS — K219 Gastro-esophageal reflux disease without esophagitis: Secondary | ICD-10-CM | POA: Diagnosis not present

## 2022-07-24 DIAGNOSIS — I10 Essential (primary) hypertension: Secondary | ICD-10-CM | POA: Diagnosis not present

## 2022-07-24 DIAGNOSIS — Z888 Allergy status to other drugs, medicaments and biological substances status: Secondary | ICD-10-CM | POA: Diagnosis not present

## 2022-07-24 DIAGNOSIS — I129 Hypertensive chronic kidney disease with stage 1 through stage 4 chronic kidney disease, or unspecified chronic kidney disease: Secondary | ICD-10-CM | POA: Diagnosis present

## 2022-07-24 DIAGNOSIS — Z853 Personal history of malignant neoplasm of breast: Secondary | ICD-10-CM | POA: Diagnosis not present

## 2022-07-24 DIAGNOSIS — N390 Urinary tract infection, site not specified: Secondary | ICD-10-CM | POA: Diagnosis present

## 2022-07-24 DIAGNOSIS — J189 Pneumonia, unspecified organism: Secondary | ICD-10-CM | POA: Diagnosis present

## 2022-07-24 DIAGNOSIS — Z806 Family history of leukemia: Secondary | ICD-10-CM

## 2022-07-24 DIAGNOSIS — R059 Cough, unspecified: Secondary | ICD-10-CM | POA: Diagnosis not present

## 2022-07-24 DIAGNOSIS — Z803 Family history of malignant neoplasm of breast: Secondary | ICD-10-CM

## 2022-07-24 DIAGNOSIS — Z8249 Family history of ischemic heart disease and other diseases of the circulatory system: Secondary | ICD-10-CM | POA: Diagnosis not present

## 2022-07-24 DIAGNOSIS — Z79899 Other long term (current) drug therapy: Secondary | ICD-10-CM

## 2022-07-24 DIAGNOSIS — N179 Acute kidney failure, unspecified: Secondary | ICD-10-CM | POA: Diagnosis not present

## 2022-07-24 DIAGNOSIS — N171 Acute kidney failure with acute cortical necrosis: Secondary | ICD-10-CM | POA: Diagnosis not present

## 2022-07-24 DIAGNOSIS — M8580 Other specified disorders of bone density and structure, unspecified site: Secondary | ICD-10-CM | POA: Diagnosis not present

## 2022-07-24 DIAGNOSIS — N183 Chronic kidney disease, stage 3 unspecified: Secondary | ICD-10-CM | POA: Diagnosis not present

## 2022-07-24 DIAGNOSIS — Z886 Allergy status to analgesic agent status: Secondary | ICD-10-CM

## 2022-07-24 DIAGNOSIS — I700 Atherosclerosis of aorta: Secondary | ICD-10-CM | POA: Diagnosis not present

## 2022-07-24 DIAGNOSIS — R000 Tachycardia, unspecified: Secondary | ICD-10-CM | POA: Diagnosis not present

## 2022-07-24 DIAGNOSIS — Z743 Need for continuous supervision: Secondary | ICD-10-CM | POA: Diagnosis not present

## 2022-07-24 LAB — CBC WITH DIFFERENTIAL/PLATELET
Abs Immature Granulocytes: 0 10*3/uL (ref 0.00–0.07)
Basophils Absolute: 0 10*3/uL (ref 0.0–0.1)
Basophils Relative: 0 %
Eosinophils Absolute: 0 10*3/uL (ref 0.0–0.5)
Eosinophils Relative: 0 %
HCT: 30.1 % — ABNORMAL LOW (ref 36.0–46.0)
Hemoglobin: 9.5 g/dL — ABNORMAL LOW (ref 12.0–15.0)
Lymphocytes Relative: 2 %
Lymphs Abs: 0.7 10*3/uL (ref 0.7–4.0)
MCH: 29.6 pg (ref 26.0–34.0)
MCHC: 31.6 g/dL (ref 30.0–36.0)
MCV: 93.8 fL (ref 80.0–100.0)
Monocytes Absolute: 0.4 10*3/uL (ref 0.1–1.0)
Monocytes Relative: 1 %
Neutro Abs: 34 10*3/uL — ABNORMAL HIGH (ref 1.7–7.7)
Neutrophils Relative %: 97 %
Platelets: 251 10*3/uL (ref 150–400)
RBC: 3.21 MIL/uL — ABNORMAL LOW (ref 3.87–5.11)
RDW: 18.3 % — ABNORMAL HIGH (ref 11.5–15.5)
WBC: 35 10*3/uL — ABNORMAL HIGH (ref 4.0–10.5)
nRBC: 0 % (ref 0.0–0.2)
nRBC: 0 /100 WBC

## 2022-07-24 LAB — COMPREHENSIVE METABOLIC PANEL
ALT: 9 U/L (ref 0–44)
AST: 17 U/L (ref 15–41)
Albumin: 2.9 g/dL — ABNORMAL LOW (ref 3.5–5.0)
Alkaline Phosphatase: 64 U/L (ref 38–126)
Anion gap: 17 — ABNORMAL HIGH (ref 5–15)
BUN: 58 mg/dL — ABNORMAL HIGH (ref 8–23)
CO2: 18 mmol/L — ABNORMAL LOW (ref 22–32)
Calcium: 9.2 mg/dL (ref 8.9–10.3)
Chloride: 100 mmol/L (ref 98–111)
Creatinine, Ser: 3.98 mg/dL — ABNORMAL HIGH (ref 0.44–1.00)
GFR, Estimated: 10 mL/min — ABNORMAL LOW (ref >60)
Glucose, Bld: 94 mg/dL (ref 70–99)
Potassium: 4.9 mmol/L (ref 3.5–5.1)
Sodium: 135 mmol/L (ref 135–145)
Total Bilirubin: 0.6 mg/dL (ref 0.3–1.2)
Total Protein: 5.7 g/dL — ABNORMAL LOW (ref 6.5–8.1)

## 2022-07-24 LAB — LACTIC ACID, PLASMA: Lactic Acid, Venous: 1.7 mmol/L (ref 0.5–1.9)

## 2022-07-24 LAB — PROTIME-INR
INR: 1.2 (ref 0.8–1.2)
Prothrombin Time: 15 seconds (ref 11.4–15.2)

## 2022-07-24 MED ORDER — ACETAMINOPHEN 325 MG PO TABS
650.0000 mg | ORAL_TABLET | Freq: Four times a day (QID) | ORAL | Status: DC | PRN
  Administered 2022-07-26: 650 mg via ORAL
  Filled 2022-07-24: qty 2

## 2022-07-24 MED ORDER — SODIUM CHLORIDE 0.9 % IV SOLN: INTRAVENOUS | Status: AC

## 2022-07-24 MED ORDER — SIMVASTATIN 20 MG PO TABS
40.0000 mg | ORAL_TABLET | Freq: Every evening | ORAL | Status: DC
  Administered 2022-07-24 – 2022-07-26 (×3): 40 mg via ORAL
  Filled 2022-07-24 (×3): qty 2

## 2022-07-24 MED ORDER — ASPIRIN 81 MG PO CHEW
81.0000 mg | CHEWABLE_TABLET | ORAL | Status: DC
  Administered 2022-07-26: 81 mg via ORAL
  Filled 2022-07-24 (×2): qty 1

## 2022-07-24 MED ORDER — ALBUTEROL SULFATE (2.5 MG/3ML) 0.083% IN NEBU: 2.5000 mg | INHALATION_SOLUTION | RESPIRATORY_TRACT | Status: DC | PRN

## 2022-07-24 MED ORDER — METRONIDAZOLE 500 MG/100ML IV SOLN
500.0000 mg | Freq: Two times a day (BID) | INTRAVENOUS | Status: DC
  Administered 2022-07-24 – 2022-07-27 (×6): 500 mg via INTRAVENOUS
  Filled 2022-07-24 (×6): qty 100

## 2022-07-24 MED ORDER — PROMETHAZINE HCL 12.5 MG PO TABS: 12.5000 mg | ORAL_TABLET | Freq: Four times a day (QID) | ORAL | Status: DC | PRN

## 2022-07-24 MED ORDER — SODIUM CHLORIDE 0.9 % IV SOLN
1.0000 g | INTRAVENOUS | Status: DC
  Administered 2022-07-25 – 2022-07-27 (×3): 1 g via INTRAVENOUS
  Filled 2022-07-24 (×3): qty 10

## 2022-07-24 MED ORDER — ALBUTEROL SULFATE HFA 108 (90 BASE) MCG/ACT IN AERS: 2.0000 | INHALATION_SPRAY | RESPIRATORY_TRACT | Status: DC | PRN

## 2022-07-24 MED ORDER — HYDRALAZINE HCL 20 MG/ML IJ SOLN: 5.0000 mg | Freq: Four times a day (QID) | INTRAMUSCULAR | Status: DC | PRN

## 2022-07-24 MED ORDER — SODIUM CHLORIDE 0.9 % IV SOLN
2.0000 g | Freq: Once | INTRAVENOUS | Status: AC
  Administered 2022-07-24: 2 g via INTRAVENOUS
  Filled 2022-07-24: qty 20

## 2022-07-24 MED ORDER — FESOTERODINE FUMARATE ER 4 MG PO TB24
4.0000 mg | ORAL_TABLET | Freq: Every day | ORAL | Status: DC
  Administered 2022-07-24 – 2022-07-27 (×4): 4 mg via ORAL
  Filled 2022-07-24 (×5): qty 1

## 2022-07-24 MED ORDER — GABAPENTIN 100 MG PO CAPS
100.0000 mg | ORAL_CAPSULE | Freq: Three times a day (TID) | ORAL | Status: DC
  Administered 2022-07-24 – 2022-07-27 (×9): 100 mg via ORAL
  Filled 2022-07-24 (×9): qty 1

## 2022-07-24 MED ORDER — TRAMADOL HCL 50 MG PO TABS: 50.0000 mg | ORAL_TABLET | Freq: Two times a day (BID) | ORAL | Status: DC | PRN

## 2022-07-24 MED ORDER — PANTOPRAZOLE SODIUM 40 MG PO TBEC: 40.0000 mg | DELAYED_RELEASE_TABLET | Freq: Every day | ORAL | Status: DC

## 2022-07-24 MED ORDER — CICLOPIROX 8 % EX SOLN: 1.0000 | Freq: Every day | CUTANEOUS | Status: DC

## 2022-07-24 MED ORDER — GUAIFENESIN ER 600 MG PO TB12
1200.0000 mg | ORAL_TABLET | Freq: Two times a day (BID) | ORAL | Status: DC
  Administered 2022-07-24 – 2022-07-27 (×6): 1200 mg via ORAL
  Filled 2022-07-24 (×6): qty 2

## 2022-07-24 MED ORDER — PANTOPRAZOLE SODIUM 40 MG PO TBEC
40.0000 mg | DELAYED_RELEASE_TABLET | Freq: Two times a day (BID) | ORAL | Status: DC
  Administered 2022-07-25 – 2022-07-27 (×5): 40 mg via ORAL
  Filled 2022-07-24 (×5): qty 1

## 2022-07-24 MED ORDER — POLYVINYL ALCOHOL 1.4 % OP SOLN: Freq: Every day | OPHTHALMIC | Status: DC | PRN

## 2022-07-24 MED ORDER — HEPARIN SODIUM (PORCINE) 5000 UNIT/ML IJ SOLN
5000.0000 [IU] | Freq: Two times a day (BID) | INTRAMUSCULAR | Status: DC
  Administered 2022-07-24 – 2022-07-27 (×6): 5000 [IU] via SUBCUTANEOUS
  Filled 2022-07-24 (×6): qty 1

## 2022-07-24 MED ORDER — SODIUM CHLORIDE 0.9 % IV SOLN
500.0000 mg | Freq: Once | INTRAVENOUS | Status: AC
  Administered 2022-07-24: 500 mg via INTRAVENOUS
  Filled 2022-07-24: qty 5

## 2022-07-24 MED ORDER — LACTATED RINGERS IV BOLUS
30.0000 mL/kg | Freq: Once | INTRAVENOUS | Status: AC
  Administered 2022-07-24: 2160 mL via INTRAVENOUS

## 2022-07-24 MED ORDER — MONTELUKAST SODIUM 10 MG PO TABS
10.0000 mg | ORAL_TABLET | Freq: Every day | ORAL | Status: DC
  Administered 2022-07-24 – 2022-07-27 (×4): 10 mg via ORAL
  Filled 2022-07-24 (×4): qty 1

## 2022-07-24 MED ORDER — ALLOPURINOL 100 MG PO TABS
300.0000 mg | ORAL_TABLET | Freq: Every day | ORAL | Status: DC
  Administered 2022-07-24 – 2022-07-25 (×2): 300 mg via ORAL
  Filled 2022-07-24 (×2): qty 3

## 2022-07-24 MED ORDER — CAMPHOR-MENTHOL-METHYL SAL 3.1-6-10 % EX PTCH: 1.0000 | MEDICATED_PATCH | Freq: Every day | CUTANEOUS | Status: DC | PRN

## 2022-07-24 MED ORDER — BENZONATATE 100 MG PO CAPS: 100.0000 mg | ORAL_CAPSULE | Freq: Three times a day (TID) | ORAL | Status: DC | PRN

## 2022-07-24 MED ORDER — FERROUS SULFATE 325 (65 FE) MG PO TABS
325.0000 mg | ORAL_TABLET | Freq: Three times a day (TID) | ORAL | Status: DC
  Administered 2022-07-24 – 2022-07-27 (×8): 325 mg via ORAL
  Filled 2022-07-24 (×8): qty 1

## 2022-07-24 MED ORDER — SODIUM CHLORIDE 0.9 % IV SOLN
500.0000 mg | INTRAVENOUS | Status: DC
  Administered 2022-07-25 – 2022-07-27 (×3): 500 mg via INTRAVENOUS
  Filled 2022-07-24 (×3): qty 5

## 2022-07-24 NOTE — Progress Notes (Signed)
[facility] following for sepsis protocol. 

## 2022-07-24 NOTE — ED Provider Notes (Signed)
[facility] Provider Note  CSN: [id number] Arrival date & time: 07/24/22 1249  Chief Complaint(s) No chief complaint on file.  HPI Melissa Morton is a 89 y.o. female with history of hypertension, hyperlipidemia, distant history of breast cancer, presenting to the emergency department with lightheadedness.  Patient reports that this morning, she woke up and felt very lightheaded.  She reports multiple episodes of lightheadedness, worse on standing starting today.  No fevers.  She reports that she otherwise feels pretty good.  Denies any shortness of breath, chest pain, abdominal pain.  No back pain, headaches.  She reports she recently was treated for UTI, completed antibiotics yesterday.  She also reports a cough for the past 3 weeks.  Family called EMS, who found the patient was hypotensive with a systolic of 70 and brought her to the emergency department.   Past Medical History Past Medical History:  Diagnosis Date   Allergy    seasonal allergies   Arthritis    knee and hands   Breast cancer, left breast ([place]) 12/07/2011   Breast cancer, left breast ([place]) 11/24/11   left breast 4 o'clock bx=invasive ductal ca,grade II,ER/PR=+   Diverticulitis    Full dentures    GERD (gastroesophageal reflux disease)    HOH (hard of hearing)    right ear    Hx of radiation therapy 03/14/12- 05/02/12   left breast 5040 gray in 28 fx, site of presentation lower outer quadrant boosted to 6440 gray   Hyperlipemia    Hypertension    Left knee DJD    PONV (postoperative nausea and vomiting)    has used scop patch-that helped   Reflux    Renal insufficiency 11/09/2011   Shortness of breath    Wears glasses    Patient Active Problem List   Diagnosis Date Noted   UTI (urinary tract infection) Klebsiella 12/25/2013   DJD (degenerative joint disease) of knee 12/24/2013   PONV (postoperative nausea and vomiting)    GERD (gastroesophageal reflux disease)     Hypertension    Left knee DJD    Osteoporosis 03/20/2013   Hx of radiation therapy    Breast cancer of lower-outer quadrant of left female breast ([name]) 12/07/2011   Leucocytosis 11/09/2011   Renal insufficiency 11/09/2011   Gout 11/09/2011   Home Medication(s) Prior to Admission medications   Medication Sig Start Date End Date Taking? Authorizing Provider  acetaminophen (TYLENOL) 325 MG tablet Take 650 mg by mouth every 6 (six) hours as needed for moderate pain (pain).     [provider]  alendronate (FOSAMAX) 70 MG tablet Take 70 mg by mouth once a week. On Wednesdays. Take with a full glass of water on an empty stomach.    [provider]  allopurinol (ZYLOPRIM) 300 MG tablet Take 300 mg by mouth daily.    [provider]  amLODipine (NORVASC) 2.5 MG tablet Take 2.5 mg by mouth 2 (two) times daily. 01/30/22   [provider]  aspirin 81 MG chewable tablet Chew 81 mg by mouth 2 (two) times a week.    [provider]  Bacillus Coagulans-Inulin (ALIGN PREBIOTIC-PROBIOTIC PO) Take by mouth.    [provider]  benzonatate (TESSALON) 100 MG capsule Take 100 mg by mouth 3 (three) times daily as needed for cough. 01/28/22   [provider]  calcium-vitamin D (OSCAL WITH D) 500-5 MG-MCG tablet Take 1 tablet by mouth.    [provider]  Camphor-Menthol-Methyl Sal (SALONPAS) 3.05-08-08 % PTCH Place 1 patch onto the skin daily as needed (Pain).    [provider]  Carboxymethylcellulose Sodium (DRY EYE RELIEF OP) Place 1-2 drops into both eyes daily as needed (Dry eyes).    [provider]  cefdinir (OMNICEF) 300 MG capsule Take 1 capsule (300 mg total) by mouth every 12 (twelve) hours. Patient not taking: Reported on 05/12/2022 02/06/22   Geradine Girt, DO  ciclopirox (PENLAC) 8 % solution Apply 1 Application topically daily. To left thumb nail and big toe on left foot 01/20/22   [provider]   colchicine 0.6 MG tablet Take 0.6 mg by mouth as needed (for gout flare).     [provider]  esomeprazole (NEXIUM) 40 MG capsule Take 40 mg by mouth daily.  10/01/14   [provider]  ferrous sulfate 325 (65 FE) MG EC tablet Take 325 mg by mouth 3 (three) times daily with meals.    [provider]  furosemide (LASIX) 20 MG tablet Take 20 mg by mouth daily as needed. 01/22/22   [provider]  gabapentin (NEURONTIN) 100 MG capsule Take 1 capsule (100 mg total) by mouth 3 (three) times daily. 01/29/20   Tasia Catchings, Amy V, PA-C  losartan (COZAAR) 100 MG tablet Take 100 mg by mouth daily.    [provider]  montelukast (SINGULAIR) 10 MG tablet Take 10 mg by mouth daily. 12/23/21   [provider]  Multiple Vitamin (MULTIVITAMIN WITH MINERALS) TABS tablet Take 1 tablet by mouth daily.    [provider]  polycarbophil (FIBERCON) 625 MG tablet Take 625 mg by mouth daily. Patient not taking: Reported on 05/12/2022    [provider]  PROAIR HFA 108 (90 BASE) MCG/ACT inhaler Inhale 2 puffs into the lungs as needed for wheezing or shortness of breath. 02/04/15   [provider]  promethazine (PHENERGAN) 12.5 MG tablet Take 12.5 mg by mouth every 6 (six) hours as needed for nausea or vomiting. 06/18/19   [provider]  simvastatin (ZOCOR) 40 MG tablet Take 1 tablet (40 mg total) by mouth every evening. 02/12/22   Geradine Girt, DO  solifenacin (VESICARE) 10 MG tablet Take 10 mg by mouth daily.    [provider]  traMADol (ULTRAM) 50 MG tablet Take 50 mg by mouth every 6 (six) hours as needed for moderate pain or severe pain (pain).    [provider]                                                                                                                                    Past Surgical History Past Surgical History:  Procedure Laterality Date   ABDOMINAL HYSTERECTOMY  89   total   BLADDER  SUSPENSION     BREAST LUMPECTOMY  01/04/2012   left,invasive ductcl ca, grade II,lloq, lymphovascular and perineural invaion ,DCIS,  CHOLECYSTECTOMY     EYE SURGERY     both cataracts   FRACTURE SURGERY     lt foot   KNEE SURGERY  2000   rt    TONSILLECTOMY     TOTAL KNEE ARTHROPLASTY Left 12/24/2013   Procedure: LEFT TOTAL KNEE ARTHROPLASTY;  Surgeon: Lorn Junes, MD;  Location: [facility];  Service: Orthopedics;  Laterality: Left;   TUBAL LIGATION     Family History Family History  Problem Relation Age of Onset   Breast cancer Sister 78   Leukemia Sister    Heart disease Father    Breast cancer Maternal Aunt 100   Lung cancer Cousin        smoker    Social History Social History   Tobacco Use   Smoking status: Former    Packs/day: 0.25    Years: 20.00    Additional pack years: 0.00    Total pack years: 5.00    Types: Cigarettes    Quit date: 11/09/1991    Years since quitting: 30.7   Smokeless tobacco: Never  Vaping Use   Vaping Use: Never used  Substance Use Topics   Alcohol use: Yes    Comment: socially   Drug use: No   Allergies Dilaudid [hydromorphone], Nsaids, and Prednisone  Review of Systems Review of Systems  All other systems reviewed and are negative.   Physical Exam Vital Signs  I have reviewed the triage vital signs BP (!) 98/48   Pulse (!) 101   Temp 97.8 F (36.6 C)   Resp (!) 22   Ht 5\' 3"  (1.6 m)   Wt 72 kg   SpO2 94%   BMI 28.12 kg/m  Physical Exam Vitals and nursing note reviewed.  Constitutional:      General: She is not in acute distress.    Appearance: She is well-developed.  HENT:     Head: Normocephalic and atraumatic.     Mouth/Throat:     Mouth: Mucous membranes are dry.  Eyes:     Pupils: Pupils are equal, round, and reactive to light.  Cardiovascular:     Rate and Rhythm: Normal rate and regular rhythm.     Heart sounds: No murmur heard. Pulmonary:     Effort: Pulmonary effort is normal. No respiratory distress.      Breath sounds: Rales (bibasilar) present.  Abdominal:     General: Abdomen is flat.     Palpations: Abdomen is soft.     Tenderness: There is no abdominal tenderness.  Musculoskeletal:        General: No tenderness.     Right lower leg: No edema.     Left lower leg: No edema.  Skin:    General: Skin is warm and dry.  Neurological:     General: No focal deficit present.     Mental Status: She is alert. Mental status is at baseline.  Psychiatric:        Mood and Affect: Mood normal.        Behavior: Behavior normal.     ED Results and Treatments Labs (all labs ordered are listed, but only abnormal results are displayed) Labs Reviewed  COMPREHENSIVE METABOLIC PANEL - Abnormal; Notable for the following components:      Result Value   CO2 18 (*)    BUN 58 (*)    Creatinine, Ser 3.98 (*)    Total Protein 5.7 (*)    Albumin 2.9 (*)    GFR, Estimated  10 (*)    Anion gap 17 (*)    All other components within normal limits  CBC WITH DIFFERENTIAL/PLATELET - Abnormal; Notable for the following components:   WBC 35.0 (*)    RBC 3.21 (*)    Hemoglobin 9.5 (*)    HCT 30.1 (*)    RDW 18.3 (*)    Neutro Abs 34.0 (*)    All other components within normal limits  CULTURE, BLOOD (ROUTINE X 2)  CULTURE, BLOOD (ROUTINE X 2)  LACTIC ACID, PLASMA  PROTIME-INR  URINALYSIS, ROUTINE W REFLEX MICROSCOPIC                                                                                                                          Radiology DG Chest Port 1 View  Result Date: 07/24/2022 CLINICAL DATA:  Cough. EXAM: PORTABLE CHEST 1 VIEW COMPARISON:  March 05, 2022. FINDINGS: Stable cardiac silhouette. Rounded left perihilar density is noted. Bilateral peribronchial thickening is again noted. Bony thorax is unremarkable. IMPRESSION: Bilateral peribronchial thickening is noted suggesting some degree of bronchitis. Rounded left perihilar density is now noted; CT scan of the chest with Intravenous  contrast is recommended to rule out possible mass. Electronically Signed   By: Marijo Conception M.D.   On: 07/24/2022 14:26    Pertinent labs & imaging results that were available during my care of the patient were reviewed by me and considered in my medical decision making (see MDM for details).  Medications Ordered in ED Medications  azithromycin (ZITHROMAX) 500 mg in sodium chloride 0.9 % 250 mL IVPB (500 mg Intravenous New Bag/Given 07/24/22 1449)  lactated ringers bolus 2,160 mL (2,160 mLs Intravenous Bolus 07/24/22 1407)  cefTRIAXone (ROCEPHIN) 2 g in sodium chloride 0.9 % 100 mL IVPB (2 g Intravenous New Bag/Given 07/24/22 1449)                                                                                                                                     Procedures .Critical Care  Performed by: Cristie Hem, MD Authorized by: Cristie Hem, MD   Critical care provider statement:    Critical care time (minutes):  30   Critical care was necessary to treat or prevent imminent or life-threatening deterioration of the following conditions:  Respiratory failure and sepsis   Critical care was time spent personally by me on the  following activities:  Development of treatment plan with patient or surrogate, discussions with consultants, evaluation of patient's response to treatment, examination of patient, ordering and review of laboratory studies, ordering and review of radiographic studies, ordering and performing treatments and interventions, pulse oximetry, re-evaluation of patient's condition and review of old charts   Care discussed with: admitting provider     (including critical care time)  Medical Decision Making / ED Course   MDM:  89 year old female presenting to the emergency department for lightheadedness.  Patient overall well-appearing, physical exam notable for bilateral crackles, dry mucous membranes.  No peripheral edema.  Patient also  hypotensive.  Suspect likely infectious cause, given pulmonary findings and chest x-ray suggestive of a possible pneumonia.  She also has a very high leukocytosis.  She has had a chronic cough for 3 weeks.  She is also recently been treated for urinary infection, she denies having any symptoms currently and reports that she did not have symptoms when she was first diagnosed but will check her urine.  No abdominal pain or tenderness to suggest intra-abdominal infection.  Patient also appears very dehydrated, which may be contributing.  Will give IV fluids.  No evidence of skin or soft tissue infection on exam.  Patient with normal mental status, no headaches to suggest intracranial infection.  Less likely alternative causes of low blood pressure such as cardiac cause with reassuring exam with no pulmonary edema.  Labs also notable for severe acute kidney injury likely due to low blood pressure and dehydration.  Discussed with hospitalist Dr. Roosevelt Locks who will admit the patient for further management.  CT chest ordered per radiology recommendation.     Additional history obtained: -Additional history obtained from ems -External records from outside source obtained and reviewed including: Chart review including previous notes, labs, imaging, consultation notes including ED visit for thigh pain 05/12/22   Lab Tests: -I ordered, reviewed, and interpreted labs.   The pertinent results include:   Labs Reviewed  COMPREHENSIVE METABOLIC PANEL - Abnormal; Notable for the following components:      Result Value   CO2 18 (*)    BUN 58 (*)    Creatinine, Ser 3.98 (*)    Total Protein 5.7 (*)    Albumin 2.9 (*)    GFR, Estimated 10 (*)    Anion gap 17 (*)    All other components within normal limits  CBC WITH DIFFERENTIAL/PLATELET - Abnormal; Notable for the following components:   WBC 35.0 (*)    RBC 3.21 (*)    Hemoglobin 9.5 (*)    HCT 30.1 (*)    RDW 18.3 (*)    Neutro Abs 34.0 (*)    All  other components within normal limits  CULTURE, BLOOD (ROUTINE X 2)  CULTURE, BLOOD (ROUTINE X 2)  LACTIC ACID, PLASMA  PROTIME-INR  URINALYSIS, ROUTINE W REFLEX MICROSCOPIC    Notable for leukocytosis, normal lactate   EKG   EKG Interpretation  Date/Time:  Saturday July 24 2022 12:58:36 EDT Ventricular Rate:  107 PR Interval:  153 QRS Duration: 74 QT Interval:  322 QTC Calculation: 430 R Axis:   -10 Text Interpretation: Sinus tachycardia Low voltage, precordial leads Confirmed by Garnette Gunner [phone]) on 07/24/2022 2:22:15 PM         Imaging Studies ordered: I ordered imaging studies including CXR On my interpretation imaging demonstrates bilateral PNA I independently visualized and interpreted imaging. I agree with the radiologist interpretation   Medicines ordered and prescription drug  management: Meds ordered this encounter  Medications   lactated ringers bolus 2,160 mL   cefTRIAXone (ROCEPHIN) 2 g in sodium chloride 0.9 % 100 mL IVPB    Order Specific Question:   Antibiotic Indication:    Answer:   CAP   azithromycin (ZITHROMAX) 500 mg in sodium chloride 0.9 % 250 mL IVPB    -I have reviewed the patients home medicines and have made adjustments as needed   Consultations Obtained: I requested consultation with the hospitalist,  and discussed lab and imaging findings as well as pertinent plan - they recommend: admission   Cardiac Monitoring: The patient was maintained on a cardiac monitor.  I personally viewed and interpreted the cardiac monitored which showed an underlying rhythm of: sinus tachycardia  Social Determinants of Health:  Diagnosis or treatment significantly limited by social determinants of health: former smoker   Reevaluation: After the interventions noted above, I reevaluated the patient and found that their symptoms have improved  Co morbidities that complicate the patient evaluation  Past Medical History:  Diagnosis Date    Allergy    seasonal allergies   Arthritis    knee and hands   Breast cancer, left breast ([place]) 12/07/2011   Breast cancer, left breast ([place]) 11/24/11   left breast 4 o'clock bx=invasive ductal ca,grade II,ER/PR=+   Diverticulitis    Full dentures    GERD (gastroesophageal reflux disease)    HOH (hard of hearing)    right ear    Hx of radiation therapy 03/14/12- 05/02/12   left breast 5040 gray in 28 fx, site of presentation lower outer quadrant boosted to 6440 gray   Hyperlipemia    Hypertension    Left knee DJD    PONV (postoperative nausea and vomiting)    has used scop patch-that helped   Reflux    Renal insufficiency 11/09/2011   Shortness of breath    Wears glasses       Dispostion: Disposition decision including need for hospitalization was considered, and patient admitted to the hospital.    Final Clinical Impression(s) / ED Diagnoses Final diagnoses:  Severe sepsis ([place])  Dehydration  Acute kidney injury ([place])  Pneumonia due to infectious organism, unspecified laterality, unspecified part of lung     This chart was dictated using voice recognition software.  Despite best efforts to proofread,  errors can occur which can change the documentation meaning.    Cristie Hem, MD 07/24/22 [phone]

## 2022-07-24 NOTE — ED Notes (Signed)
Water given per MD request

## 2022-07-24 NOTE — ED Notes (Signed)
ED TO INPATIENT HANDOFF REPORT  ED Nurse Name and Phone #: [phone]  S Name/Age/Gender Melissa Morton 89 y.o. female Room/Bed: 025C/025C  Code Status   Code Status: Full Code  Home/SNF/Other Home Patient oriented to: self, place, time, and situation Is this baseline? Yes   Triage Complete: Triage complete  Chief Complaint Sepsis [facility]) [A41.9]  Triage Note Battling a UTI for several weeks has finished antibiotics several spells over the past 2 week were she has gotten "loopy" today was trying to get out of bed and slid out of bed denies falling or having injury just needed help getting up no LOC on seen EMS reports palpated BP 70. Alert oriented uses a walker at baseline.   CBG 201 HR 116 94 2L not on O2 at home  110/72   Allergies Allergies  Allergen Reactions   Dilaudid [Hydromorphone] Nausea And Vomiting   Nsaids Other (See Comments)    11/09/11-Avoid due to decreased liver function. Can tolerate low dose Aspirin    Prednisone Nausea And Vomiting    Level of Care/Admitting Diagnosis ED Disposition     ED Disposition  Admit   Condition  --   [place]: [facility] [100100]  Level of Care: Progressive [102]  Admit to Progressive based on following criteria: MULTISYSTEM THREATS such as stable sepsis, metabolic/electrolyte imbalance with or without encephalopathy that is responding to early treatment.  May admit patient to [facility] or [facility] if equivalent level of care is available:: No  Covid Evaluation: Asymptomatic - no recent exposure (last 10 days) testing not required  Diagnosis: Sepsis [facility]) [id number]  Admitting Physician: Lequita Halt [id number]  Attending Physician: Lequita Halt [id number]  Certification:: I certify this patient will need inpatient services for at least 2 midnights  Estimated Length of Stay: 3          B Medical/Surgery History Past Medical History:  Diagnosis Date   Allergy    seasonal  allergies   Arthritis    knee and hands   Breast cancer, left breast ([place]) 12/07/2011   Breast cancer, left breast ([place]) 11/24/11   left breast 4 o'clock bx=invasive ductal ca,grade II,ER/PR=+   Diverticulitis    Full dentures    GERD (gastroesophageal reflux disease)    HOH (hard of hearing)    right ear    Hx of radiation therapy 03/14/12- 05/02/12   left breast 5040 gray in 28 fx, site of presentation lower outer quadrant boosted to 6440 gray   Hyperlipemia    Hypertension    Left knee DJD    PONV (postoperative nausea and vomiting)    has used scop patch-that helped   Reflux    Renal insufficiency 11/09/2011   Shortness of breath    Wears glasses    Past Surgical History:  Procedure Laterality Date   ABDOMINAL HYSTERECTOMY  93   total   BLADDER SUSPENSION     BREAST LUMPECTOMY  01/04/2012   left,invasive ductcl ca, grade II,lloq, lymphovascular and perineural invaion ,DCIS,   CHOLECYSTECTOMY     EYE SURGERY     both cataracts   FRACTURE SURGERY     lt foot   KNEE SURGERY  2000   rt    TONSILLECTOMY     TOTAL KNEE ARTHROPLASTY Left 12/24/2013   Procedure: LEFT TOTAL KNEE ARTHROPLASTY;  Surgeon: Lorn Junes, MD;  Location: [facility];  Service: Orthopedics;  Laterality: Left;   TUBAL LIGATION  A IV Location/Drains/Wounds Patient Lines/Drains/Airways Status     Active Line/Drains/Airways     Name Placement date Placement time Site Days   Peripheral IV 07/24/22 22 G Right Hand 07/24/22  1307  Hand  less than 1   Peripheral IV 07/24/22 Right Antecubital 07/24/22  1325  Antecubital  less than 1            Intake/Output Last 24 hours No intake or output data in the 24 hours ending 07/24/22 1620  Labs/Imaging Results for orders placed or performed during the hospital encounter of 07/24/22 (from the past 48 hour(s))  Comprehensive metabolic panel     Status: Abnormal   Collection Time: 07/24/22  1:47 PM  Result Value Ref Range   Sodium 135 135 - 145 mmol/L    Potassium 4.9 3.5 - 5.1 mmol/L   Chloride 100 98 - 111 mmol/L   CO2 18 (L) 22 - 32 mmol/L   Glucose, Bld 94 70 - 99 mg/dL    Comment: Glucose reference range applies only to samples taken after fasting for at least 8 hours.   BUN 58 (H) 8 - 23 mg/dL   Creatinine, Ser 3.98 (H) 0.44 - 1.00 mg/dL   Calcium 9.2 8.9 - 10.3 mg/dL   Total Protein 5.7 (L) 6.5 - 8.1 g/dL   Albumin 2.9 (L) 3.5 - 5.0 g/dL   AST 17 15 - 41 U/L   ALT 9 0 - 44 U/L   Alkaline Phosphatase 64 38 - 126 U/L   Total Bilirubin 0.6 0.3 - 1.2 mg/dL   GFR, Estimated 10 (L) >60 mL/min    Comment: (NOTE) Calculated using the CKD-EPI Creatinine Equation (2021)    Anion gap 17 (H) 5 - 15    Comment: Performed at [facility], [place] [address], [place], [place] [address]  CBC with Differential     Status: Abnormal   Collection Time: 07/24/22  1:47 PM  Result Value Ref Range   WBC 35.0 (H) 4.0 - 10.5 K/uL   RBC 3.21 (L) 3.87 - 5.11 MIL/uL   Hemoglobin 9.5 (L) 12.0 - 15.0 g/dL   HCT 30.1 (L) 36.0 - 46.0 %   MCV 93.8 80.0 - 100.0 fL   MCH 29.6 26.0 - 34.0 pg   MCHC 31.6 30.0 - 36.0 g/dL   RDW 18.3 (H) 11.5 - 15.5 %   Platelets 251 150 - 400 K/uL   nRBC 0.0 0.0 - 0.2 %   Neutrophils Relative % 97 %   Neutro Abs 34.0 (H) 1.7 - 7.7 K/uL   Lymphocytes Relative 2 %   Lymphs Abs 0.7 0.7 - 4.0 K/uL   Monocytes Relative 1 %   Monocytes Absolute 0.4 0.1 - 1.0 K/uL   Eosinophils Relative 0 %   Eosinophils Absolute 0.0 0.0 - 0.5 K/uL   Basophils Relative 0 %   Basophils Absolute 0.0 0.0 - 0.1 K/uL   nRBC 0 0 /100 WBC   Abs Immature Granulocytes 0.00 0.00 - 0.07 K/uL    Comment: Performed at [facility], [place] [address], [place], [place] [address]  Protime-INR     Status: None   Collection Time: 07/24/22  1:47 PM  Result Value Ref Range   Prothrombin Time 15.0 11.4 - 15.2 seconds   INR 1.2 0.8 - 1.2    Comment: (NOTE) INR goal varies based on device and disease states. Performed at [facility], [place]  [address] [place], [place] [address]  Lactic acid, plasma     Status: None   Collection Time: 07/24/22  1:52 PM  Result Value Ref Range   Lactic Acid, Venous 1.7 0.5 - 1.9 mmol/L    Comment: Performed at [place] [address] [place], [place] [address]   CT CHEST WO CONTRAST  Result Date: 07/24/2022 CLINICAL DATA:  Pneumonia. EXAM: CT CHEST WITHOUT CONTRAST TECHNIQUE: Multidetector CT imaging of the chest was performed following the standard protocol without IV contrast. RADIATION DOSE REDUCTION: This exam was performed according to the departmental dose-optimization program which includes automated exposure control, adjustment of the mA and/or kV according to patient size and/or use of iterative reconstruction technique. COMPARISON:  Chest radiograph dated 07/24/2022. FINDINGS: Evaluation of this exam is limited in the absence of intravenous contrast. Cardiovascular: There is no cardiomegaly or pericardial effusion. There is coronary vascular calcification and calcification of the mitral annulus. There is mild atherosclerotic calcification of the thoracic aorta. No aneurysmal dilatation. The central pulmonary arteries are grossly unremarkable on this noncontrast CT. Mediastinum/Nodes: Top-normal subcarinal lymph node measures 10 mm short axis. Mildly enlarged lymph node anterior to the carina measures 13 mm short axis. Several mildly rounded top-normal lymph nodes in the upper mediastinum measure up to 9 mm. There is a small hiatal hernia. The esophagus is grossly unremarkable. No mediastinal fluid collection. Lungs/Pleura: Bilateral airspace opacities with large area of consolidation and air bronchogram in the left lower lobe most consistent with multi lobar pneumonia. Follow-up to resolution recommended. There is no pleural effusion or pneumothorax. The central airways are patent. Upper Abdomen: Cholecystectomy. Bilateral adrenal thickening. A 1.7 cm left adrenal adenoma. Musculoskeletal:  Osteopenia with degenerative changes of the spine. No acute osseous pathology. IMPRESSION: 1. Multi lobar pneumonia. Follow-up to resolution recommended. 2. A 1.7 cm left adrenal adenoma. 3. Small hiatal hernia. 4.  Aortic Atherosclerosis (ICD10-I70.0). Electronically Signed   By: Anner Crete M.D.   On: 07/24/2022 15:37   DG Chest Port 1 View  Result Date: 07/24/2022 CLINICAL DATA:  Cough. EXAM: PORTABLE CHEST 1 VIEW COMPARISON:  March 05, 2022. FINDINGS: Stable cardiac silhouette. Rounded left perihilar density is noted. Bilateral peribronchial thickening is again noted. Bony thorax is unremarkable. IMPRESSION: Bilateral peribronchial thickening is noted suggesting some degree of bronchitis. Rounded left perihilar density is now noted; CT scan of the chest with Intravenous contrast is recommended to rule out possible mass. Electronically Signed   By: Marijo Conception M.D.   On: 07/24/2022 14:26    Pending Labs Unresulted Labs (From admission, onward)     Start     Ordered   07/25/22 0500  CBC  Tomorrow morning,   R        07/24/22 1537   07/25/22 [id number]  Basic metabolic panel  Tomorrow morning,   R        07/24/22 1537   07/24/22 1539  Strep pneumoniae urinary antigen  Once,   R        07/24/22 1538   07/24/22 1539  Legionella Pneumophila Serogp 1 Ur Ag  Once,   R        07/24/22 1538   07/24/22 1539  Mycoplasma pneumoniae antibody, IgM  Once,   R        07/24/22 1538   07/24/22 1539  Expectorated Sputum Assessment w Gram Stain, Rflx to Resp Cult  Once,   R        07/24/22 1538   07/24/22 1312  Culture, blood (Routine  x 2)  BLOOD CULTURE X 2,   R      07/24/22 1312   07/24/22 1312  Urinalysis, Routine w reflex microscopic -Urine, Clean Catch  Once,   URGENT       Question:  Specimen Source  Answer:  Urine, Clean Catch   07/24/22 1312            Vitals/Pain Today's Vitals   07/24/22 1500 07/24/22 1545 07/24/22 1553 07/24/22 1615  BP: (!) 98/48 (!) 92/56 (!) 92/56 (!) 94/48   Pulse: (!) 101 95 (!) 119 94  Resp: (!) 22 19 16 17   Temp:      SpO2: 94% 97% 91% 91%  Weight:      Height:      PainSc:        Isolation Precautions No active isolations  Medications Medications  0.9 %  sodium chloride infusion (has no administration in time range)  cefTRIAXone (ROCEPHIN) 1 g in sodium chloride 0.9 % 100 mL IVPB (has no administration in time range)  azithromycin (ZITHROMAX) 500 mg in sodium chloride 0.9 % 250 mL IVPB (has no administration in time range)  acetaminophen (TYLENOL) tablet 650 mg (has no administration in time range)  allopurinol (ZYLOPRIM) tablet 300 mg (has no administration in time range)  aspirin chewable tablet 81 mg (has no administration in time range)  traMADol (ULTRAM) tablet 50 mg (has no administration in time range)  simvastatin (ZOCOR) tablet 40 mg (has no administration in time range)  fesoterodine (TOVIAZ) tablet 4 mg (has no administration in time range)  ferrous sulfate EC tablet 325 mg (has no administration in time range)  gabapentin (NEURONTIN) capsule 100 mg (has no administration in time range)  benzonatate (TESSALON) capsule 100 mg (has no administration in time range)  montelukast (SINGULAIR) tablet 10 mg (has no administration in time range)  promethazine (PHENERGAN) tablet 12.5 mg (has no administration in time range)  Camphor-Menthol-Methyl Sal 3.05-08-08 % PTCH 1 patch (has no administration in time range)  polyethylene glycol 0.4% and propylene glycol 0.3% (SYSTANE) ophthalmic gel (has no administration in time range)  ciclopirox (PENLAC) 8 % solution 1 Application (has no administration in time range)  heparin injection 5,000 Units (has no administration in time range)  guaiFENesin (MUCINEX) 12 hr tablet 1,200 mg (has no administration in time range)  hydrALAZINE (APRESOLINE) injection 5 mg (has no administration in time range)  metroNIDAZOLE (FLAGYL) IVPB 500 mg (has no administration in time range)  albuterol  (PROVENTIL) (2.5 MG/3ML) 0.083% nebulizer solution 2.5 mg (has no administration in time range)  pantoprazole (PROTONIX) EC tablet 40 mg (has no administration in time range)  lactated ringers bolus 2,160 mL (2,160 mLs Intravenous Bolus 07/24/22 1407)  cefTRIAXone (ROCEPHIN) 2 g in sodium chloride 0.9 % 100 mL IVPB (0 g Intravenous Stopped 07/24/22 1619)  azithromycin (ZITHROMAX) 500 mg in sodium chloride 0.9 % 250 mL IVPB (0 mg Intravenous Stopped 07/24/22 1559)    Mobility walks with device     Focused Assessments Sepsis, lactic negative second was d/c    R Recommendations: See Admitting Provider Note  Report given to:   Additional Notes:  Daughter and grandson lives with pt.

## 2022-07-24 NOTE — ED Triage Notes (Signed)
Battling a UTI for several weeks has finished antibiotics several spells over the past 2 week were she has gotten "loopy" today was trying to get out of bed and slid out of bed denies falling or having injury just needed help getting up no LOC on seen EMS reports palpated BP 70. Alert oriented uses a walker at baseline.   CBG 201 HR 116 94 2L not on O2 at home  110/72

## 2022-07-24 NOTE — H&P (Signed)
History and Physical    STAFANIE CASTANON [id number] DOB: 08-03-1933 DOA: 07/24/2022  PCP: [name], MD (Confirm with patient/family/NH records and if not entered, this has to be entered at [facility] point of entry) Patient coming from: Home  I have personally briefly reviewed patient's old medical records in [place]  Chief Complaint: Cough, feeling tired, no appetite  HPI: Melissa LENSER is a 89 y.o. female with medical history significant of breast cancer in remission, CKD stage IIIb, recurrent UTI on suppressive antibiotics, mild intermittent asthma, GERD, presented with cough generalized weakness.  Symptoms started about 3 weeks ago, patient started to develop dry cough intermittent, more severe when lying down and in the morning, denies any fever or chills.  Gradually she also developed shortness of breath and this week, her symptoms became worse.  Even walking short distance because significant shortness of breath and cough.  Patient has been on suppressive trimethoprim for history of recurrent UTI including 1 episode recent Pseudomonas UTI 2 weeks ago and treated with 7 days course of Levaquin completed about 7 days ago.  She denies any urinary symptoms at this point.  Denies any vomiting or diarrhea.  Last few days patient has had severe poor appetite and barely ate or drink anything whole day yesterday became very dehydrated. ED Course: Patient was found to be afebrile, tachycardia, blood pressure borderline97/42, saturation 91% on room air.  Checks x-ray showed bilateral infiltrates.  CT chest showed bilateral lower lobe pneumonia.  WBC 35, hemoglobin 9.5, creatinine 3.9 compared to baseline 1.6-1.8.  Patient was given total 2.6 L IV fluid and ceftriaxone and sisomicin.  Review of Systems: As per HPI otherwise 14 point review of systems negative.    Past Medical History:  Diagnosis Date   Allergy    seasonal allergies   Arthritis    knee and hands   Breast cancer,  left breast ([place]) 12/07/2011   Breast cancer, left breast ([place]) 11/24/11   left breast 4 o'clock bx=invasive ductal ca,grade II,ER/PR=+   Diverticulitis    Full dentures    GERD (gastroesophageal reflux disease)    HOH (hard of hearing)    right ear    Hx of radiation therapy 03/14/12- 05/02/12   left breast 5040 gray in 28 fx, site of presentation lower outer quadrant boosted to 6440 gray   Hyperlipemia    Hypertension    Left knee DJD    PONV (postoperative nausea and vomiting)    has used scop patch-that helped   Reflux    Renal insufficiency 11/09/2011   Shortness of breath    Wears glasses     Past Surgical History:  Procedure Laterality Date   ABDOMINAL HYSTERECTOMY  93   total   BLADDER SUSPENSION     BREAST LUMPECTOMY  01/04/2012   left,invasive ductcl ca, grade II,lloq, lymphovascular and perineural invaion ,DCIS,   CHOLECYSTECTOMY     EYE SURGERY     both cataracts   FRACTURE SURGERY     lt foot   KNEE SURGERY  2000   rt    TONSILLECTOMY     TOTAL KNEE ARTHROPLASTY Left 12/24/2013   Procedure: LEFT TOTAL KNEE ARTHROPLASTY;  Surgeon: Lorn Junes, MD;  Location: [facility];  Service: Orthopedics;  Laterality: Left;   TUBAL LIGATION       reports that she quit smoking about 30 years ago. Her smoking use included cigarettes. She has a 5.00 pack-year smoking history. She has never used smokeless  tobacco. She reports current alcohol use. She reports that she does not use drugs.  Allergies  Allergen Reactions   Dilaudid [Hydromorphone] Nausea And Vomiting   Nsaids Other (See Comments)    11/09/11-Avoid due to decreased liver function. Can tolerate low dose Aspirin    Prednisone Nausea And Vomiting    Family History  Problem Relation Age of Onset   Breast cancer Sister 7   Leukemia Sister    Heart disease Father    Breast cancer Maternal Aunt 83   Lung cancer Cousin        smoker    Prior to Admission medications   Medication Sig Start Date End Date Taking?  Authorizing Provider  acetaminophen (TYLENOL) 325 MG tablet Take 650 mg by mouth every 6 (six) hours as needed for moderate pain (pain).     [provider]  alendronate (FOSAMAX) 70 MG tablet Take 70 mg by mouth once a week. On Wednesdays. Take with a full glass of water on an empty stomach.    [provider]  allopurinol (ZYLOPRIM) 300 MG tablet Take 300 mg by mouth daily.    [provider]  amLODipine (NORVASC) 2.5 MG tablet Take 2.5 mg by mouth 2 (two) times daily. 01/30/22   [provider]  aspirin 81 MG chewable tablet Chew 81 mg by mouth 2 (two) times a week.    [provider]  Bacillus Coagulans-Inulin (ALIGN PREBIOTIC-PROBIOTIC PO) Take by mouth.    [provider]  benzonatate (TESSALON) 100 MG capsule Take 100 mg by mouth 3 (three) times daily as needed for cough. 01/28/22   [provider]  calcium-vitamin D (OSCAL WITH D) 500-5 MG-MCG tablet Take 1 tablet by mouth.    [provider]  Camphor-Menthol-Methyl Sal (SALONPAS) 3.05-08-08 % PTCH Place 1 patch onto the skin daily as needed (Pain).    [provider]  Carboxymethylcellulose Sodium (DRY EYE RELIEF OP) Place 1-2 drops into both eyes daily as needed (Dry eyes).    [provider]  cefdinir (OMNICEF) 300 MG capsule Take 1 capsule (300 mg total) by mouth every 12 (twelve) hours. Patient not taking: Reported on 05/12/2022 02/06/22   Geradine Girt, DO  ciclopirox (PENLAC) 8 % solution Apply 1 Application topically daily. To left thumb nail and big toe on left foot 01/20/22   [provider]  colchicine 0.6 MG tablet Take 0.6 mg by mouth as needed (for gout flare).     [provider]  esomeprazole (NEXIUM) 40 MG capsule Take 40 mg by mouth daily.  10/01/14   [provider]  ferrous sulfate 325 (65 FE) MG EC tablet Take 325 mg by mouth 3 (three) times daily with meals.    [provider]  furosemide (LASIX) 20 MG  tablet Take 20 mg by mouth daily as needed. 01/22/22   [provider]  gabapentin (NEURONTIN) 100 MG capsule Take 1 capsule (100 mg total) by mouth 3 (three) times daily. 01/29/20   Tasia Catchings, Amy V, PA-C  losartan (COZAAR) 100 MG tablet Take 100 mg by mouth daily.    [provider]  montelukast (SINGULAIR) 10 MG tablet Take 10 mg by mouth daily. 12/23/21   [provider]  Multiple Vitamin (MULTIVITAMIN WITH MINERALS) TABS tablet Take 1 tablet by mouth daily.    [provider]  polycarbophil (FIBERCON) 625 MG tablet Take 625 mg by mouth daily. Patient not taking: Reported on 05/12/2022    [provider]  PROAIR HFA 108 (90 BASE) MCG/ACT inhaler Inhale 2 puffs into the lungs as needed for wheezing or shortness of breath. 02/04/15   [provider]  promethazine (PHENERGAN) 12.5 MG tablet Take 12.5 mg by mouth every 6 (six) hours as needed for nausea or vomiting. 06/18/19   [provider]  simvastatin (ZOCOR) 40 MG tablet Take 1 tablet (40 mg total) by mouth every evening. 02/12/22   Geradine Girt, DO  solifenacin (VESICARE) 10 MG tablet Take 10 mg by mouth daily.    [provider]  traMADol (ULTRAM) 50 MG tablet Take 50 mg by mouth every 6 (six) hours as needed for moderate pain or severe pain (pain).    [provider]    Physical Exam: Vitals:   07/24/22 1415 07/24/22 1430 07/24/22 1445 07/24/22 1500  BP: (!) 87/58 (!) 97/45 (!) 102/53 (!) 98/48  Pulse: (!) 101 99 99 (!) 101  Resp: (!) 22 20 (!) 21 (!) 22  Temp:      SpO2: 96% 95% 92% 94%  Weight:      Height:        Constitutional: NAD, calm, comfortable Vitals:   07/24/22 1415 07/24/22 1430 07/24/22 1445 07/24/22 1500  BP: (!) 87/58 (!) 97/45 (!) 102/53 (!) 98/48  Pulse: (!) 101 99 99 (!) 101  Resp: (!) 22 20 (!) 21 (!) 22  Temp:      SpO2: 96% 95% 92% 94%  Weight:      Height:       Eyes: PERRL, lids and conjunctivae normal ENMT: Mucous membranes  are dry. Posterior pharynx clear of any exudate or lesions.Normal dentition.  Neck: normal, supple, no masses, no thyromegaly Respiratory: clear to auscultation bilaterally, no wheezing, coarse crackles on bilateral lower fields, increasing breathing effort. No accessory muscle use.  Cardiovascular: Regular rate and rhythm, no murmurs / rubs / gallops. No extremity edema. 2+ pedal pulses. No carotid bruits.  Abdomen: no tenderness, no masses palpated. No hepatosplenomegaly. Bowel sounds positive.  Musculoskeletal: no clubbing / cyanosis. No joint deformity upper and lower extremities. Good ROM, no contractures. Normal muscle tone.  Skin: no rashes, lesions, ulcers. No induration Neurologic: CN 2-12 grossly intact. Sensation intact, DTR normal. Strength 5/5 in all 4.  Psychiatric: Normal judgment and insight. Alert and oriented x 3. Normal mood.     Labs on Admission: I have personally reviewed following labs and imaging studies  CBC: Recent Labs  Lab 07/24/22 1347  WBC 35.0*  NEUTROABS 34.0*  HGB 9.5*  HCT 30.1*  MCV 93.8  PLT [id number]   Basic Metabolic Panel: Recent Labs  Lab 07/24/22 1347  NA 135  K 4.9  CL 100  CO2 18*  GLUCOSE 94  BUN 58*  CREATININE 3.98*  CALCIUM 9.2   GFR: Estimated Creatinine Clearance: 9.6 mL/min (A) (by C-G formula based on SCr of 3.98 mg/dL (H)). Liver Function Tests: Recent Labs  Lab 07/24/22 1347  AST 17  ALT 9  ALKPHOS 64  BILITOT 0.6  PROT 5.7*  ALBUMIN 2.9*   No results for input(s): "LIPASE", "AMYLASE" in the last 168 hours. No results for input(s): "AMMONIA" in the last 168 hours. Coagulation Profile: Recent Labs  Lab 07/24/22 1347  INR 1.2   Cardiac Enzymes: No results for input(s): "CKTOTAL", "CKMB", "CKMBINDEX", "TROPONINI" in the last 168 hours. BNP (last 3 results) No results for input(s): "PROBNP" in the last 8760 hours. HbA1C: No results for input(s): "HGBA1C" in the last 72 hours. CBG:  No results for input(s):  "GLUCAP" in the last 168 hours. Lipid Profile: No results for input(s): "CHOL", "HDL", "LDLCALC", "TRIG", "CHOLHDL", "LDLDIRECT" in the last 72 hours. Thyroid Function Tests: No results for input(s): "TSH", "T4TOTAL", "FREET4", "T3FREE", "THYROIDAB" in the last 72 hours. Anemia Panel: No results for input(s): "VITAMINB12", "FOLATE", "FERRITIN", "TIBC", "IRON", "RETICCTPCT" in the last 72 hours. Urine analysis:    Component Value Date/Time   COLORURINE YELLOW 05/12/2022 1816   APPEARANCEUR HAZY (A) 05/12/2022 1816   LABSPEC 1.009 05/12/2022 1816   LABSPEC 1.010 11/10/2011 1302   PHURINE 6.0 05/12/2022 1816   GLUCOSEU NEGATIVE 05/12/2022 1816   HGBUR NEGATIVE 05/12/2022 1816   BILIRUBINUR NEGATIVE 05/12/2022 1816   BILIRUBINUR NEGAtive 09/08/2021 1411   BILIRUBINUR Negative 11/10/2011 1302   KETONESUR NEGATIVE 05/12/2022 1816   PROTEINUR NEGATIVE 05/12/2022 1816   UROBILINOGEN 0.2 09/08/2021 1411   UROBILINOGEN 1.0 08/05/2014 1246   NITRITE NEGATIVE 05/12/2022 1816   LEUKOCYTESUR MODERATE (A) 05/12/2022 1816   LEUKOCYTESUR Negative 11/10/2011 1302    Radiological Exams on Admission: CT CHEST WO CONTRAST  Result Date: 07/24/2022 CLINICAL DATA:  Pneumonia. EXAM: CT CHEST WITHOUT CONTRAST TECHNIQUE: Multidetector CT imaging of the chest was performed following the standard protocol without IV contrast. RADIATION DOSE REDUCTION: This exam was performed according to the departmental dose-optimization program which includes automated exposure control, adjustment of the mA and/or kV according to patient size and/or use of iterative reconstruction technique. COMPARISON:  Chest radiograph dated 07/24/2022. FINDINGS: Evaluation of this exam is limited in the absence of intravenous contrast. Cardiovascular: There is no cardiomegaly or pericardial effusion. There is coronary vascular calcification and calcification of the mitral annulus. There is mild atherosclerotic calcification of the thoracic  aorta. No aneurysmal dilatation. The central pulmonary arteries are grossly unremarkable on this noncontrast CT. Mediastinum/Nodes: Top-normal subcarinal lymph node measures 10 mm short axis. Mildly enlarged lymph node anterior to the carina measures 13 mm short axis. Several mildly rounded top-normal lymph nodes in the upper mediastinum measure up to 9 mm. There is a small hiatal hernia. The esophagus is grossly unremarkable. No mediastinal fluid collection. Lungs/Pleura: Bilateral airspace opacities with large area of consolidation and air bronchogram in the left lower lobe most consistent with multi lobar pneumonia. Follow-up to resolution recommended. There is no pleural effusion or pneumothorax. The central airways are patent. Upper Abdomen: Cholecystectomy. Bilateral adrenal thickening. A 1.7 cm left adrenal adenoma. Musculoskeletal: Osteopenia with degenerative changes of the spine. No acute osseous pathology. IMPRESSION: 1. Multi lobar pneumonia. Follow-up to resolution recommended. 2. A 1.7 cm left adrenal adenoma. 3. Small hiatal hernia. 4.  Aortic Atherosclerosis (ICD10-I70.0). Electronically Signed   By: Anner Crete M.D.   On: 07/24/2022 15:37   DG Chest Port 1 View  Result Date: 07/24/2022 CLINICAL DATA:  Cough. EXAM: PORTABLE CHEST 1 VIEW COMPARISON:  March 05, 2022. FINDINGS: Stable cardiac silhouette. Rounded left perihilar density is noted. Bilateral peribronchial thickening is again noted. Bony thorax is unremarkable. IMPRESSION: Bilateral peribronchial thickening is noted suggesting some degree of bronchitis. Rounded left perihilar density is now noted; CT scan of the chest with Intravenous contrast is recommended to rule out possible mass. Electronically Signed   By: Marijo Conception M.D.   On: 07/24/2022 14:26    EKG: Independently reviewed.  Sinus, no acute ST changes.  Assessment/Plan Principal Problem:   Sepsis ([place]) Active Problems:   Hypertension   PNA (pneumonia)   AKI  (acute kidney injury) ([place])  (please populate well all  problems here in Problem List. (For example, if patient is on BP meds at home and you resume or decide to hold them, it is a problem that needs to be her. Same for CAD, COPD, HLD and so on)  Sepsis -Evidenced by tachycardia, leukocytosis with signs of acute endorgan damage of AKI.  Source of infection is bilateral pneumonia. -Clinically high suspicion of aspiration pneumonia from poorly controlled GERD.  As patient reported cough happens more at night as well as in the morning.  Patient has been taking alternative omeprazole and Pepcid sometimes requiring additional stomach meds. Will increase her PPI to twice daily for now and recommend she follow-up with outpatient GI ASAP. -Will also consult speech evaluation -CT chest image reviewed, given the significant leukocytosis, will cover CAP plus anaerobic with regimen including ceftriaxone and azithromycin and Flagyl -IV fluid, hold off home BP meds -Discussed with patient and her daughter at bedside, recommend repeat CT chest in 4 to 6 weeks to document resolution of the pneumonia.  Patient and daughter expressed understanding and agreed  AKI on CKD stage II -Likely prerenal secondary to sepsis and volume contraction secondary to poor oral intake -Continue IV fluid, repeat renal function tomorrow  Recurrent UTIs -Hold off trimethoprim while patient on antibiotics to treat pneumonia  HTN -Hold off home BP meds including amlodipine, Lasix, and losartan -Start as needed hydralazine  Mild intermittent asthma -No symptoms signs of acute flareup -Continue as needed breathing meds  Gout -Stable, continue allopurinol  DVT prophylaxis: Heparin subQ Code Status: Full code Family Communication: Daughter at bedside Disposition Plan: Expect more than 2 midnight hospital stay, as patient is sick with sepsis of PNA requiring IV ABX. Consults called: None Admission status: PCU   Lequita Halt  MD [facility] Pager [phone] 07/24/2022, 3:50 PM

## 2022-07-25 DIAGNOSIS — I10 Essential (primary) hypertension: Secondary | ICD-10-CM | POA: Diagnosis not present

## 2022-07-25 DIAGNOSIS — A419 Sepsis, unspecified organism: Secondary | ICD-10-CM | POA: Diagnosis not present

## 2022-07-25 DIAGNOSIS — J189 Pneumonia, unspecified organism: Secondary | ICD-10-CM

## 2022-07-25 DIAGNOSIS — N171 Acute kidney failure with acute cortical necrosis: Secondary | ICD-10-CM

## 2022-07-25 DIAGNOSIS — N179 Acute kidney failure, unspecified: Secondary | ICD-10-CM | POA: Diagnosis not present

## 2022-07-25 DIAGNOSIS — R6520 Severe sepsis without septic shock: Secondary | ICD-10-CM

## 2022-07-25 LAB — CBC
HCT: 25.7 % — ABNORMAL LOW (ref 36.0–46.0)
Hemoglobin: 8.6 g/dL — ABNORMAL LOW (ref 12.0–15.0)
MCH: 30.5 pg (ref 26.0–34.0)
MCHC: 33.5 g/dL (ref 30.0–36.0)
MCV: 91.1 fL (ref 80.0–100.0)
Platelets: 218 10*3/uL (ref 150–400)
RBC: 2.82 MIL/uL — ABNORMAL LOW (ref 3.87–5.11)
RDW: 18.3 % — ABNORMAL HIGH (ref 11.5–15.5)
WBC: 33.5 10*3/uL — ABNORMAL HIGH (ref 4.0–10.5)
nRBC: 0 % (ref 0.0–0.2)

## 2022-07-25 LAB — BASIC METABOLIC PANEL
Anion gap: 10 (ref 5–15)
BUN: 55 mg/dL — ABNORMAL HIGH (ref 8–23)
CO2: 16 mmol/L — ABNORMAL LOW (ref 22–32)
Calcium: 8.3 mg/dL — ABNORMAL LOW (ref 8.9–10.3)
Chloride: 106 mmol/L (ref 98–111)
Creatinine, Ser: 3.23 mg/dL — ABNORMAL HIGH (ref 0.44–1.00)
GFR, Estimated: 13 mL/min — ABNORMAL LOW (ref >60)
Glucose, Bld: 81 mg/dL (ref 70–99)
Potassium: 4.6 mmol/L (ref 3.5–5.1)
Sodium: 132 mmol/L — ABNORMAL LOW (ref 135–145)

## 2022-07-25 NOTE — Evaluation (Signed)
Physical Therapy Evaluation Patient Details Name: Melissa Morton MRN: [id number] DOB: Sep 26, 1933 Today's Date: 07/25/2022  History of Present Illness  Pt is an 89 y.o. female who presented 07/24/22 with cough and generalized weakness. Admitted with sepsis secondary to bil pneumonia. High suspicion for aspiration pneumonia from poorly controlled GERD. PMH: breast cancer in remission, CKD stage IIIb, recurrent UTI on suppressive antibiotics, asthma, HLD, HTN, GERD   Clinical Impression  Pt presents with condition above and deficits mentioned below, see PT Problem List. PTA, she was mod I using her QC vs rollator for functional mobility. She does endorse a hx of falls, but no "recent" falls. She lives with her daughter in a 1-level house with 2 STE. Currently, pt is needing minA to transfer to stand from low surfaces, but can perform transfers from standard height surfaces at a minguard assist level. In addition, she was able to ambulate with a RW at a min guard assist level with no LOB today. Pt displays deficits in lower extremity strength and power, balance, endurance, and activity tolerance. She is at risk for falls, thus recommending follow-up with HHPT. Will continue to follow acutely.       Recommendations for follow up therapy are one component of a multi-disciplinary discharge planning process, led by the attending physician.  Recommendations may be updated based on patient status, additional functional criteria and insurance authorization.  Follow Up Recommendations Home health PT      Assistance Recommended at Discharge PRN  Patient can return home with the following  A little help with bathing/dressing/bathroom;Assistance with cooking/housework;Assist for transportation;Help with stairs or ramp for entrance    Equipment Recommendations None recommended by PT  Recommendations for Other Services       Functional Status Assessment Patient has had a recent decline in their functional  status and demonstrates the ability to make significant improvements in function in a reasonable and predictable amount of time.     Precautions / Restrictions Precautions Precautions: Fall;Other (comment) Precaution Comments: watch SpO2 Restrictions Weight Bearing Restrictions: No      Mobility  Bed Mobility Overal bed mobility: Needs Assistance Bed Mobility: Supine to Sit     Supine to sit: Min guard, HOB elevated     General bed mobility comments: Extra time but able to complete with min guard assist for safety only, HOB elevated and pt using bed rails    Transfers Overall transfer level: Needs assistance Equipment used: Rolling walker (2 wheels) Transfers: Sit to/from Stand Sit to Stand: Min guard, Min assist           General transfer comment: Min guard assist to come to stand from EOB to RW, but minA to power up to stand from low toilet surface using L grab bar and R HHA.    Ambulation/Gait Ambulation/Gait assistance: Min guard Gait Distance (Feet): 140 Feet (x2 bouts of ~140 ft > ~25 ft) Assistive device: Rolling walker (2 wheels) Gait Pattern/deviations: Step-through pattern, Decreased stride length, Trunk flexed Gait velocity: reduced Gait velocity interpretation: <1.8 ft/sec, indicate of risk for recurrent falls   General Gait Details: Pt needed cues to remain proximal to RW and stand upright. Several standing rest breaks to check SpO2 due to unreliable pleth when mobile. No LOB, min guard for safety  Stairs            Wheelchair Mobility    Modified Rankin (Stroke Patients Only)       Balance Overall balance assessment: Needs assistance Sitting-balance support: No  upper extremity supported, Feet supported Sitting balance-Leahy Scale: Good Sitting balance - Comments: able to reach off BOS to donn and doff underwear seated on toilet, supervision for safety   Standing balance support: Bilateral upper extremity supported, No upper extremity  supported, During functional activity Standing balance-Leahy Scale: Fair Standing balance comment: Able to stand and adjust underwear without LOB, reliant on UE support to ambulate                             Pertinent Vitals/Pain Pain Assessment Pain Assessment: Faces Faces Pain Scale: No hurt Pain Intervention(s): Monitored during session    Home Living Family/patient expects to be discharged to:: Private residence Living Arrangements: Children (daughter) Available Help at Discharge: Family;Available 24 hours/day Type of Home: House Home Access: Stairs to enter Entrance Stairs-Rails: Right (ascending) Entrance Stairs-Number of Steps: 2   Home Layout: One level Home Equipment: Rollator (4 wheels);Shower seat;Wheelchair - manual;Cane - quad      Prior Function Prior Level of Function : Needs assist             Mobility Comments: Mod I using QC vs rollator ADLs Comments: Daughter assists with showers, but pt can dress herself. Does not drive     Hand Dominance        Extremity/Trunk Assessment   Upper Extremity Assessment Upper Extremity Assessment: Overall WFL for tasks assessed    Lower Extremity Assessment Lower Extremity Assessment: Generalized weakness    Cervical / Trunk Assessment Cervical / Trunk Assessment: Kyphotic  Communication   Communication: HOH  Cognition Arousal/Alertness: Awake/alert Behavior During Therapy: WFL for tasks assessed/performed Overall Cognitive Status: Within Functional Limits for tasks assessed                                          General Comments General comments (skin integrity, edema, etc.): Pleth unreliable, but appeared to likely decline some on RA with gait but would rebound back to 90s% quickly with reliable pleth when pt would stop and would correct the line, >/= 93% on RA at rest; BP 104/56 (69) supine, 102/50 (65) sitting    Exercises     Assessment/Plan    PT Assessment  Patient needs continued PT services  PT Problem List Decreased strength;Decreased balance;Decreased activity tolerance;Decreased mobility;Cardiopulmonary status limiting activity       PT Treatment Interventions DME instruction;Gait training;Stair training;Functional mobility training;Therapeutic activities;Therapeutic exercise;Balance training;Neuromuscular re-education;Patient/family education    PT Goals (Current goals can be found in the Care Plan section)  Acute Rehab PT Goals Patient Stated Goal: to improve and go home PT Goal Formulation: With patient/family Time For Goal Achievement: 08/08/22 Potential to Achieve Goals: Good    Frequency Min 3X/week     Co-evaluation               AM-PAC PT "6 Clicks" Mobility  Outcome Measure Help needed turning from your back to your side while in a flat bed without using bedrails?: A Little Help needed moving from lying on your back to sitting on the side of a flat bed without using bedrails?: A Little Help needed moving to and from a bed to a chair (including a wheelchair)?: A Little Help needed standing up from a chair using your arms (e.g., wheelchair or bedside chair)?: A Little Help needed to walk in hospital room?: A Little  Help needed climbing 3-5 steps with a railing? : A Little 6 Click Score: 18    End of Session Equipment Utilized During Treatment: Gait belt Activity Tolerance: Patient tolerated treatment well Patient left: in chair;with call bell/phone within reach;with chair alarm set;with family/visitor present Nurse Communication: Mobility status;Other (comment) (sats) PT Visit Diagnosis: Unsteadiness on feet (R26.81);Other abnormalities of gait and mobility (R26.89);Muscle weakness (generalized) (M62.81)    Time: [id number] PT Time Calculation (min) (ACUTE ONLY): 42 min   Charges:   PT Evaluation $PT Eval Moderate Complexity: 1 Mod PT Treatments $Gait Training: 8-22 mins $Therapeutic Activity: 8-22 mins         Moishe Spice, PT, DPT Acute Rehabilitation Services  Office: [phone]   Orvan Falconer 07/25/2022, 1:55 PM

## 2022-07-25 NOTE — Plan of Care (Signed)

## 2022-07-25 NOTE — Progress Notes (Addendum)
PROGRESS NOTE    Melissa Morton  [id number] DOB: 15-Nov-1933 DOA: 07/24/2022 PCP: [name], MD    Brief Narrative:  Melissa Morton is a 89 y.o. female with medical history significant of breast cancer in remission, CKD stage IIIb, recurrent UTI on suppressive antibiotics, mild intermittent asthma, GERD, presented to the hospital with cough, shortness of breath and generalized weakness.  Patient is on suppressive trimethoprim for history of recurrent UTI including 1 episode recent Pseudomonas UTI 2 weeks ago and treated with 7 days course of Levaquin completed about 7 days ago.  Without urinary symptoms at this time.  In the ED patient was afebrile, tachycardic with borderline hypotensive.  Chest x-ray showed bilateral infiltrates.  CT chest showed bilateral lower lobe pneumonia.  CBC showed leukocytosis, creatinine was 3.9 from baseline of 1.6-1.8.  Patient was given IV fluids, Rocephin and Zithromax and was admitted hospital for further evaluation and treatment.    Assessment and Plan:  Sepsis likely secondary to bilateral pneumonia  Patient presented with tachycardia leukocytosis with AKI.  Stability of aspiration pneumonia.  Will get a speech evaluation.  CT chest with bilateral pneumonia.  Has significant leukocytosis.  Continue Rocephin and Zithromax and Flagyl.  Will need to follow-up with scan in few weeks.   AKI on CKD stage II Likely secondary to sepsis and volume depletion.  Likely secondary to use of Lasix and losartan.  Creatinine of 3.2 today from 3.9 on presentation.  Continue IV fluids.   Recurrent UTIs Was on treatment with trimethoprim as outpatient.  Currently on hold.  Continue Rocephin.   Essential hypertension Currently amlodipine, Lasix, and losartan hold due to AKI.  On as needed hydralazine   Mild intermittent asthma Continue bronchodilators.   Gout On allopurinol.  Hold now due to AKI.    DVT prophylaxis: heparin injection 5,000 Units Start:  07/24/22 2200   Code Status:     Code Status: Full Code  Disposition: Home with home health.  Status is: Inpatient Remains inpatient appropriate because: IV antibiotic, respiratory failure,   Family Communication: None at bedside.  Patient states that she lives with her daughter at home.  Consultants:  None  Procedures:  None  Antimicrobials:  Rocephin and Zithromax with Flagyl  Anti-infectives (From admission, onward)    Start     Dose/Rate Route Frequency Ordered Stop   07/25/22 1000  cefTRIAXone (ROCEPHIN) 1 g in sodium chloride 0.9 % 100 mL IVPB        1 g 200 mL/hr over 30 Minutes Intravenous Every 24 hours 07/24/22 1533     07/25/22 1000  azithromycin (ZITHROMAX) 500 mg in sodium chloride 0.9 % 250 mL IVPB        500 mg 250 mL/hr over 60 Minutes Intravenous Every 24 hours 07/24/22 1533     07/24/22 1600  metroNIDAZOLE (FLAGYL) IVPB 500 mg        500 mg 100 mL/hr over 60 Minutes Intravenous Every 12 hours 07/24/22 1552     07/24/22 1430  cefTRIAXone (ROCEPHIN) 2 g in sodium chloride 0.9 % 100 mL IVPB        2 g 200 mL/hr over 30 Minutes Intravenous  Once 07/24/22 1423 07/24/22 1619   07/24/22 1430  azithromycin (ZITHROMAX) 500 mg in sodium chloride 0.9 % 250 mL IVPB        500 mg 250 mL/hr over 60 Minutes Intravenous  Once 07/24/22 1423 07/24/22 1559       Subjective: Today, patient was seen  and examined at bedside.  Patient denies any nausea, vomiting fevers chills or rigors but has some cough with shortness of breath.  Objective: Vitals:   07/24/22 2305 07/25/22 0315 07/25/22 0700 07/25/22 0900  BP: (!) 102/53 (!) 92/41 (!) 105/50 (!) 96/37  Pulse: 91 90 96   Resp: 18 15 16    Temp: 97.9 F (36.6 C) 97.6 F (36.4 C)  98.1 F (36.7 C)  TempSrc: Oral Oral  Oral  SpO2: 95% 97% 97%   Weight:      Height:        Intake/Output Summary (Last 24 hours) at 07/25/2022 1147 Last data filed at 07/25/2022 0700 Gross per 24 hour  Intake 1567.63 ml  Output 2 ml   Net 1565.63 ml   Filed Weights   07/24/22 1308  Weight: 72 kg    Physical Examination: Body mass index is 28.12 kg/m.   General:  Average built, not in obvious distress on nasal cannula oxygen HENT:   No scleral pallor or icterus noted. Oral mucosa is moist.  Chest:    Diminished breath sounds bilaterally. No crackles or wheezes.  CVS: S1 &S2 heard. No murmur.  Regular rate and rhythm. Abdomen: Soft, nontender, nondistended.  Bowel sounds are heard.   Extremities: No cyanosis, clubbing or edema.  Peripheral pulses are palpable. Psych: Alert, awake and oriented, normal mood CNS:  No cranial nerve deficits.  Power equal in all extremities.   Skin: Warm and dry.  No rashes noted.  Data Reviewed:   CBC: Recent Labs  Lab 07/24/22 1347 07/25/22 0405  WBC 35.0* 33.5*  NEUTROABS 34.0*  --   HGB 9.5* 8.6*  HCT 30.1* 25.7*  MCV 93.8 91.1  PLT 251 [id number]    Basic Metabolic Panel: Recent Labs  Lab 07/24/22 1347 07/25/22 0405  NA 135 132*  K 4.9 4.6  CL 100 106  CO2 18* 16*  GLUCOSE 94 81  BUN 58* 55*  CREATININE 3.98* 3.23*  CALCIUM 9.2 8.3*    Liver Function Tests: Recent Labs  Lab 07/24/22 1347  AST 17  ALT 9  ALKPHOS 64  BILITOT 0.6  PROT 5.7*  ALBUMIN 2.9*     Radiology Studies: CT CHEST WO CONTRAST  Result Date: 07/24/2022 CLINICAL DATA:  Pneumonia. EXAM: CT CHEST WITHOUT CONTRAST TECHNIQUE: Multidetector CT imaging of the chest was performed following the standard protocol without IV contrast. RADIATION DOSE REDUCTION: This exam was performed according to the departmental dose-optimization program which includes automated exposure control, adjustment of the mA and/or kV according to patient size and/or use of iterative reconstruction technique. COMPARISON:  Chest radiograph dated 07/24/2022. FINDINGS: Evaluation of this exam is limited in the absence of intravenous contrast. Cardiovascular: There is no cardiomegaly or pericardial effusion. There is coronary  vascular calcification and calcification of the mitral annulus. There is mild atherosclerotic calcification of the thoracic aorta. No aneurysmal dilatation. The central pulmonary arteries are grossly unremarkable on this noncontrast CT. Mediastinum/Nodes: Top-normal subcarinal lymph node measures 10 mm short axis. Mildly enlarged lymph node anterior to the carina measures 13 mm short axis. Several mildly rounded top-normal lymph nodes in the upper mediastinum measure up to 9 mm. There is a small hiatal hernia. The esophagus is grossly unremarkable. No mediastinal fluid collection. Lungs/Pleura: Bilateral airspace opacities with large area of consolidation and air bronchogram in the left lower lobe most consistent with multi lobar pneumonia. Follow-up to resolution recommended. There is no pleural effusion or pneumothorax. The central airways are patent. Upper  Abdomen: Cholecystectomy. Bilateral adrenal thickening. A 1.7 cm left adrenal adenoma. Musculoskeletal: Osteopenia with degenerative changes of the spine. No acute osseous pathology. IMPRESSION: 1. Multi lobar pneumonia. Follow-up to resolution recommended. 2. A 1.7 cm left adrenal adenoma. 3. Small hiatal hernia. 4.  Aortic Atherosclerosis (ICD10-I70.0). Electronically Signed   By: Anner Crete M.D.   On: 07/24/2022 15:37   DG Chest Port 1 View  Result Date: 07/24/2022 CLINICAL DATA:  Cough. EXAM: PORTABLE CHEST 1 VIEW COMPARISON:  March 05, 2022. FINDINGS: Stable cardiac silhouette. Rounded left perihilar density is noted. Bilateral peribronchial thickening is again noted. Bony thorax is unremarkable. IMPRESSION: Bilateral peribronchial thickening is noted suggesting some degree of bronchitis. Rounded left perihilar density is now noted; CT scan of the chest with Intravenous contrast is recommended to rule out possible mass. Electronically Signed   By: Marijo Conception M.D.   On: 07/24/2022 14:26      LOS: 1 day    Flora Lipps, MD [facility] Available via Epic secure chat 7am-7pm After these hours, please refer to coverage provider listed on [email or web address] 07/25/2022, 11:47 AM

## 2022-07-25 NOTE — Progress Notes (Signed)
Attempted to provide report for oncoming nurse. Declined offer at this time.

## 2022-07-26 DIAGNOSIS — J189 Pneumonia, unspecified organism: Secondary | ICD-10-CM | POA: Diagnosis not present

## 2022-07-26 DIAGNOSIS — A419 Sepsis, unspecified organism: Secondary | ICD-10-CM | POA: Diagnosis not present

## 2022-07-26 DIAGNOSIS — N179 Acute kidney failure, unspecified: Secondary | ICD-10-CM | POA: Diagnosis not present

## 2022-07-26 DIAGNOSIS — I10 Essential (primary) hypertension: Secondary | ICD-10-CM | POA: Diagnosis not present

## 2022-07-26 LAB — CBC
HCT: 28.2 % — ABNORMAL LOW (ref 36.0–46.0)
Hemoglobin: 9.1 g/dL — ABNORMAL LOW (ref 12.0–15.0)
MCH: 29.8 pg (ref 26.0–34.0)
MCHC: 32.3 g/dL (ref 30.0–36.0)
MCV: 92.5 fL (ref 80.0–100.0)
Platelets: 274 10*3/uL (ref 150–400)
RBC: 3.05 MIL/uL — ABNORMAL LOW (ref 3.87–5.11)
RDW: 18.6 % — ABNORMAL HIGH (ref 11.5–15.5)
WBC: 25.8 10*3/uL — ABNORMAL HIGH (ref 4.0–10.5)
nRBC: 0 % (ref 0.0–0.2)

## 2022-07-26 LAB — BASIC METABOLIC PANEL
Anion gap: 11 (ref 5–15)
BUN: 40 mg/dL — ABNORMAL HIGH (ref 8–23)
CO2: 15 mmol/L — ABNORMAL LOW (ref 22–32)
Calcium: 8.4 mg/dL — ABNORMAL LOW (ref 8.9–10.3)
Chloride: 108 mmol/L (ref 98–111)
Creatinine, Ser: 2.14 mg/dL — ABNORMAL HIGH (ref 0.44–1.00)
GFR, Estimated: 22 mL/min — ABNORMAL LOW (ref >60)
Glucose, Bld: 69 mg/dL — ABNORMAL LOW (ref 70–99)
Potassium: 4.1 mmol/L (ref 3.5–5.1)
Sodium: 134 mmol/L — ABNORMAL LOW (ref 135–145)

## 2022-07-26 LAB — MYCOPLASMA PNEUMONIAE ANTIBODY, IGM: Mycoplasma pneumo IgM: <770 U/mL (ref 0–769)

## 2022-07-26 LAB — MAGNESIUM: Magnesium: 1.8 mg/dL (ref 1.7–2.4)

## 2022-07-26 NOTE — Progress Notes (Signed)
Physical Therapy Treatment Patient Details Name: Melissa Morton MRN: [id number] DOB: [birth year] Today's Date: 07/26/2022   History of Present Illness Pt is an 89 y.o. female who presented 07/24/22 with cough and generalized weakness. Admitted with sepsis secondary to bil pneumonia. High suspicion for aspiration pneumonia from poorly controlled GERD. PMH: breast cancer in remission, CKD stage IIIb, recurrent UTI on suppressive antibiotics, asthma, HLD, HTN, GERD    PT Comments    Pt tolerated today's session well, able to ambulate on room air, fatiguing but satting well when pleth was reading. Pt continuing to require minG-minA and RW for mobility for safety and balance but no overt LOB during session. Pt tolerating standing at sink for ADLs with one UE support, cueing throughout session for upright posture and pt able to correct. Acute PT will continue to follow pt to progress mobility and activity tolerance, discharge recommendation of HHPT remains appropriate.     Recommendations for follow up therapy are one component of a multi-disciplinary discharge planning process, led by the attending physician.  Recommendations may be updated based on patient status, additional functional criteria and insurance authorization.  Follow Up Recommendations       Assistance Recommended at Discharge PRN  Patient can return home with the following A little help with bathing/dressing/bathroom;Assistance with cooking/housework;Assist for transportation;Help with stairs or ramp for entrance;A little help with walking and/or transfers   Equipment Recommendations  None recommended by PT    Recommendations for Other Services       Precautions / Restrictions Precautions Precautions: Fall;Other (comment) Precaution Comments: watch SpO2 Restrictions Weight Bearing Restrictions: No     Mobility  Bed Mobility Overal bed mobility: Needs Assistance Bed Mobility: Supine to Sit, Sit to Supine     Supine to  sit: Min guard, HOB elevated Sit to supine: Min guard, HOB elevated   General bed mobility comments: minG for safety and line management, use of bed rail    Transfers Overall transfer level: Needs assistance Equipment used: Rolling walker (2 wheels) Transfers: Sit to/from Stand Sit to Stand: Min assist, Min guard           General transfer comment: minA required to stand from bed first trial, completeing second trial with minG. Cued for proper hand placement    Ambulation/Gait Ambulation/Gait assistance: Min guard Gait Distance (Feet): 140 Feet (with one standing rest break) Assistive device: Rolling walker (2 wheels) Gait Pattern/deviations: Step-through pattern, Decreased stride length, Trunk flexed Gait velocity: decreased     General Gait Details: cued for upright posture with forward gaze and for proximity to RW. One standing rest break during ambulation due to fatigue and to check pleth due to poor reading, tolerating room air and satting in 90s when pleth reading   Stairs             Wheelchair Mobility    Modified Rankin (Stroke Patients Only)       Balance Overall balance assessment: Needs assistance Sitting-balance support: No upper extremity supported, Feet supported Sitting balance-Leahy Scale: Good     Standing balance support: Bilateral upper extremity supported, Single extremity supported, During functional activity Standing balance-Leahy Scale: Fair Standing balance comment: reliant on UE support to ambulate and 1UE support for standing at the sink to rinse off teeth and put them in                            Cognition Arousal/Alertness: Awake/alert Behavior During Therapy: [facility]  for tasks assessed/performed Overall Cognitive Status: Within Functional Limits for tasks assessed                                 General Comments: pt pleasant throughout session        Exercises      General Comments General  comments (skin integrity, edema, etc.): pt placed on room air as RN reporting pt tolerated ambulating to the restroom without O2 earlier. Pt maintaining sats in mid to upper 90s at rest, tolerating well with ambulation but reports feeling SOB, poor pleth reading with pt gripping RW but reading in 90s with standing rest break and hand relaxed. Ended session with pt on room air, RN notified      Pertinent Vitals/Pain Pain Assessment Pain Assessment: Faces Faces Pain Scale: No hurt    Home Living                          Prior Function            PT Goals (current goals can now be found in the care plan section) Acute Rehab PT Goals Patient Stated Goal: to improve and go home PT Goal Formulation: With patient/family Time For Goal Achievement: 08/08/22 Potential to Achieve Goals: Good Progress towards PT goals: Progressing toward goals    Frequency    Min 3X/week      PT Plan Current plan remains appropriate    Co-evaluation              AM-PAC PT "6 Clicks" Mobility   Outcome Measure  Help needed turning from your back to your side while in a flat bed without using bedrails?: A Little Help needed moving from lying on your back to sitting on the side of a flat bed without using bedrails?: A Little Help needed moving to and from a bed to a chair (including a wheelchair)?: A Little Help needed standing up from a chair using your arms (e.g., wheelchair or bedside chair)?: A Little Help needed to walk in hospital room?: A Little Help needed climbing 3-5 steps with a railing? : A Little 6 Click Score: 18    End of Session Equipment Utilized During Treatment: Gait belt Activity Tolerance: Patient tolerated treatment well Patient left: in bed;with call bell/phone within reach Nurse Communication: Mobility status PT Visit Diagnosis: Unsteadiness on feet (R26.81);Other abnormalities of gait and mobility (R26.89);Muscle weakness (generalized) (M62.81)      Time: [id number] PT Time Calculation (min) (ACUTE ONLY): 23 min  Charges:  $Gait Training: 8-22 mins $Therapeutic Activity: 8-22 mins                     Charlynne Cousins, PT DPT Acute Rehabilitation Services Office [phone]    Luvenia Heller 07/26/2022, 4:20 PM

## 2022-07-26 NOTE — Plan of Care (Signed)

## 2022-07-26 NOTE — Evaluation (Signed)
Clinical/Bedside Swallow Evaluation Patient Details  Name: Melissa Morton MRN: [id number] Date of Birth: [birth year]  Today's Date: 07/26/2022 Time: SLP Start Time (ACUTE ONLY): [id number] SLP Stop Time (ACUTE ONLY): [id number] SLP Time Calculation (min) (ACUTE ONLY): 15 min  Past Medical History:  Past Medical History:  Diagnosis Date   Allergy    seasonal allergies   Arthritis    knee and hands   Breast cancer, left breast ([place]) 12/07/2011   Breast cancer, left breast ([place]) 11/24/11   left breast 4 o'clock bx=invasive ductal ca,grade II,ER/PR=+   Diverticulitis    Full dentures    GERD (gastroesophageal reflux disease)    HOH (hard of hearing)    right ear    Hx of radiation therapy 03/14/12- 05/02/12   left breast 5040 gray in 28 fx, site of presentation lower outer quadrant boosted to 6440 gray   Hyperlipemia    Hypertension    Left knee DJD    PONV (postoperative nausea and vomiting)    has used scop patch-that helped   Reflux    Renal insufficiency 11/09/2011   Shortness of breath    Wears glasses    Past Surgical History:  Past Surgical History:  Procedure Laterality Date   ABDOMINAL HYSTERECTOMY  93   total   BLADDER SUSPENSION     BREAST LUMPECTOMY  01/04/2012   left,invasive ductcl ca, grade II,lloq, lymphovascular and perineural invaion ,DCIS,   CHOLECYSTECTOMY     EYE SURGERY     both cataracts   FRACTURE SURGERY     lt foot   KNEE SURGERY  2000   rt    TONSILLECTOMY     TOTAL KNEE ARTHROPLASTY Left 12/24/2013   Procedure: LEFT TOTAL KNEE ARTHROPLASTY;  Surgeon: Lorn Junes, MD;  Location: [facility];  Service: Orthopedics;  Laterality: Left;   TUBAL LIGATION     HPI:  Pt is an 89 y.o. female who presented 07/24/22 with cough and generalized weakness. Admitted with sepsis secondary to bil pneumonia. High suspicion for aspiration pneumonia from poorly controlled GERD. PMH: breast cancer in remission, CKD stage IIIb, recurrent UTI on suppressive antibiotics, asthma, HLD,  HTN, GERD    Assessment / Plan / Recommendation  Clinical Impression  Pt does not show any overt signs of oropharyngeal dysphagia or aspiration. She denies any subjective symptoms and reports that this is her first episode of PNA. She further states that she has not experienced any symptoms of reflux in "a long time" as she has been controlling her symptoms by diet. We did discuss MBS as an option to more closely look at oropharyngeal swallow in the setting of PNA, but she politely declines at this time. SLP reviewed aspiration and esophageal precautions with pt, who states that she is already implementing most of them at home. SLP to sign off acutely per pt preference. If there are further concerns for post-prandial aspiration, would consider esophagram and/or GI consult. SLP Visit Diagnosis: Dysphagia, unspecified (R13.10)    Aspiration Risk  Mild aspiration risk    Diet Recommendation Regular;Thin liquid   Liquid Administration via: Cup;Straw Medication Administration: Whole meds with liquid Supervision: Patient able to self feed Compensations: Slow rate;Small sips/bites;Follow solids with liquid Postural Changes: Seated upright at 90 degrees;Remain upright for at least 30 minutes after po intake    Other  Recommendations Oral Care Recommendations: Oral care BID    Recommendations for follow up therapy are one component of a multi-disciplinary discharge planning process, led by the  attending physician.  Recommendations may be updated based on patient status, additional functional criteria and insurance authorization.  Follow up Recommendations No SLP follow up      Assistance Recommended at Discharge    Functional Status Assessment Patient has not had a recent decline in their functional status  Frequency and Duration            Prognosis        Swallow Study   General HPI: Pt is an 89 y.o. female who presented 07/24/22 with cough and generalized weakness. Admitted with sepsis  secondary to bil pneumonia. High suspicion for aspiration pneumonia from poorly controlled GERD. PMH: breast cancer in remission, CKD stage IIIb, recurrent UTI on suppressive antibiotics, asthma, HLD, HTN, GERD Type of Study: Bedside Swallow Evaluation Previous Swallow Assessment: none in chart Diet Prior to this Study: Regular;Thin liquids (Level 0) Temperature Spikes Noted: No Respiratory Status: Room air History of Recent Intubation: No Behavior/Cognition: Alert;Cooperative;Pleasant mood Oral Cavity Assessment: Within Functional Limits Oral Care Completed by SLP: No Oral Cavity - Dentition: Dentures, top;Dentures, bottom Vision: Functional for self-feeding Self-Feeding Abilities: Able to feed self Patient Positioning: Upright in bed Baseline Vocal Quality: Normal Volitional Cough: Strong Volitional Swallow: Able to elicit    Oral/Motor/Sensory Function Overall Oral Motor/Sensory Function: Within functional limits   Ice Chips Ice chips: Not tested   Thin Liquid Thin Liquid: Within functional limits Presentation: Self Fed;Straw    Nectar Thick Nectar Thick Liquid: Not tested   Honey Thick Honey Thick Liquid: Not tested   Puree Puree: Within functional limits Presentation: Self Fed;Spoon   Solid     Solid: Within functional limits Presentation: Self Fed      Osie Bond., M.A. [place] Office [phone]  Secure chat preferred  07/26/2022,5:25 PM

## 2022-07-26 NOTE — Progress Notes (Signed)
PROGRESS NOTE    Melissa Morton  [id number] DOB: 1933-09-24 DOA: 07/24/2022 PCP: [name], MD    Brief Narrative:  Melissa Morton is a 89 y.o. female with medical history significant of breast cancer in remission, CKD stage IIIb, recurrent UTI on suppressive antibiotics, mild intermittent asthma, GERD, presented to the hospital with cough, shortness of breath and generalized weakness.  Patient is on suppressive trimethoprim for history of recurrent UTI including 1 episode recent Pseudomonas UTI 2 weeks ago and treated with 7 days course of Levaquin completed about 7 days ago.  Without urinary symptoms at this time.  In the ED, patient was afebrile, tachycardic with borderline hypotensive.  Chest x-ray showed bilateral infiltrates.  CT chest showed bilateral lower lobe pneumonia.  CBC showed leukocytosis, creatinine was 3.9 from baseline of 1.6-1.8.  Patient was given IV fluids, Rocephin and Zithromax and was admitted hospital for further evaluation and treatment of acute kidney injury with bilateral pneumonia and hypoxia.    Assessment and Plan:  Sepsis likely secondary to bilateral pneumonia  Patient presented with tachycardia, leukocytosis with AKI.  Possibility of aspiration pneumonia.  Will get a speech evaluation.  CT chest with bilateral pneumonia.  Patient  still has significant leukocytosis.  Continue Rocephin, Zithromax and Flagyl.  Will need to follow-up with scan in few weeks to ensure resolution of pneumonia.  Blood cultures negative in 2 days.   AKI on CKD stage II Likely secondary to sepsis and volume depletion.  Likely secondary to use of Lasix and losartan.  Creatinine of 2.1 from 3.2 yesterday, initial creatinine was  3.9 on presentation and has received IV fluids during hospitalization with improvement in creatinine levels.  Baseline creatinine around 1.6-1.8.  Continue IV fluid for 1 more day and consider discontinuation in AM.  Overall positive balance for 415 mL.    Recurrent UTIs Was on treatment with trimethoprim as outpatient.  Currently on hold.  Continue IV Rocephin.   Essential hypertension Currently amlodipine, Lasix, and losartan hold due to AKI.  On as needed hydralazine   Mild intermittent asthma Continue bronchodilators.  GERD.  Will add Protonix empirically.  Might be contributing to cough and aspiration.   Gout On allopurinol.  Hold now due to AKI.  Debility, deconditioning, balance issues.  Physical therapy has seen the patient and recommend home health PT on discharge.    DVT prophylaxis: heparin injection 5,000 Units Start: 07/24/22 2200   Code Status:     Code Status: Full Code  Disposition: Home with home health likely in 1 to 2 days.  Status is: Inpatient  Remains inpatient appropriate because: IV antibiotic, hypoxia, significant leukocytosis and AKI.   Family Communication:  Spoke with the patient's daughter on the phone and updated her about the clinical condition of the patient.   Consultants:  None  Procedures:  None  Antimicrobials:  Rocephin , Zithromax with Flagyl  Anti-infectives (From admission, onward)    Start     Dose/Rate Route Frequency Ordered Stop   07/25/22 1000  cefTRIAXone (ROCEPHIN) 1 g in sodium chloride 0.9 % 100 mL IVPB        1 g 200 mL/hr over 30 Minutes Intravenous Every 24 hours 07/24/22 1533     07/25/22 1000  azithromycin (ZITHROMAX) 500 mg in sodium chloride 0.9 % 250 mL IVPB        500 mg 250 mL/hr over 60 Minutes Intravenous Every 24 hours 07/24/22 1533     07/24/22 1600  metroNIDAZOLE (FLAGYL)  IVPB 500 mg        500 mg 100 mL/hr over 60 Minutes Intravenous Every 12 hours 07/24/22 1552     07/24/22 1430  cefTRIAXone (ROCEPHIN) 2 g in sodium chloride 0.9 % 100 mL IVPB        2 g 200 mL/hr over 30 Minutes Intravenous  Once 07/24/22 1423 07/24/22 1619   07/24/22 1430  azithromycin (ZITHROMAX) 500 mg in sodium chloride 0.9 % 250 mL IVPB        500 mg 250 mL/hr over 60  Minutes Intravenous  Once 07/24/22 1423 07/24/22 1559      Subjective: Today, patient was seen and examined at bedside.  Continues to have some cough and shortness of breath.  She denies any urinary urgency, frequency or dysuria.  Denies any chest pain fever or chills.  Patient's daughter stated that there might be some concern for reflux but no obvious history of dysphagia or aspiration in the past..  Objective: Vitals:   07/25/22 1946 07/25/22 2330 07/26/22 0342 07/26/22 0400  BP: (!) 115/58 124/65 123/62   Pulse:  100 100   Resp:  17 18   Temp:  98 F (36.7 C) 98.6 F (37 C) 98.2 F (36.8 C)  TempSrc:  Oral Oral Oral  SpO2:  97% 96%   Weight:      Height:        Intake/Output Summary (Last 24 hours) at 07/26/2022 1123 Last data filed at 07/26/2022 0746 Gross per 24 hour  Intake --  Output 1150 ml  Net -1150 ml    Filed Weights   07/24/22 1308  Weight: 72 kg    Physical Examination: Body mass index is 28.12 kg/m.   General:  Average built, not in obvious distress on nasal cannula oxygen, elderly female, HENT:   No scleral pallor or icterus noted. Oral mucosa is moist.  Chest:    Diminished breath sounds bilaterally, coarse breath sounds noted.  CVS: S1 &S2 heard. No murmur.  Regular rate and rhythm. Abdomen: Soft, nontender, nondistended.  Bowel sounds are heard.   Extremities: No cyanosis, clubbing or edema.  Peripheral pulses are palpable. Psych: Alert, awake and oriented, normal mood CNS:  No cranial nerve deficits.  Power equal in all extremities.   Skin: Warm and dry.  No rashes noted.  Data Reviewed:   CBC: Recent Labs  Lab 07/24/22 1347 07/25/22 0405  WBC 35.0* 33.5*  NEUTROABS 34.0*  --   HGB 9.5* 8.6*  HCT 30.1* 25.7*  MCV 93.8 91.1  PLT 251 218     Basic Metabolic Panel: Recent Labs  Lab 07/24/22 1347 07/25/22 0405 07/26/22 0758  NA 135 132* 134*  K 4.9 4.6 4.1  CL 100 106 108  CO2 18* 16* 15*  GLUCOSE 94 81 69*  BUN 58* 55* 40*   CREATININE 3.98* 3.23* 2.14*  CALCIUM 9.2 8.3* 8.4*  MG  --   --  1.8     Liver Function Tests: Recent Labs  Lab 07/24/22 1347  AST 17  ALT 9  ALKPHOS 64  BILITOT 0.6  PROT 5.7*  ALBUMIN 2.9*      Radiology Studies: CT CHEST WO CONTRAST  Result Date: 07/24/2022 CLINICAL DATA:  Pneumonia. EXAM: CT CHEST WITHOUT CONTRAST TECHNIQUE: Multidetector CT imaging of the chest was performed following the standard protocol without IV contrast. RADIATION DOSE REDUCTION: This exam was performed according to the departmental dose-optimization program which includes automated exposure control, adjustment of the mA and/or kV  according to patient size and/or use of iterative reconstruction technique. COMPARISON:  Chest radiograph dated 07/24/2022. FINDINGS: Evaluation of this exam is limited in the absence of intravenous contrast. Cardiovascular: There is no cardiomegaly or pericardial effusion. There is coronary vascular calcification and calcification of the mitral annulus. There is mild atherosclerotic calcification of the thoracic aorta. No aneurysmal dilatation. The central pulmonary arteries are grossly unremarkable on this noncontrast CT. Mediastinum/Nodes: Top-normal subcarinal lymph node measures 10 mm short axis. Mildly enlarged lymph node anterior to the carina measures 13 mm short axis. Several mildly rounded top-normal lymph nodes in the upper mediastinum measure up to 9 mm. There is a small hiatal hernia. The esophagus is grossly unremarkable. No mediastinal fluid collection. Lungs/Pleura: Bilateral airspace opacities with large area of consolidation and air bronchogram in the left lower lobe most consistent with multi lobar pneumonia. Follow-up to resolution recommended. There is no pleural effusion or pneumothorax. The central airways are patent. Upper Abdomen: Cholecystectomy. Bilateral adrenal thickening. A 1.7 cm left adrenal adenoma. Musculoskeletal: Osteopenia with degenerative changes  of the spine. No acute osseous pathology. IMPRESSION: 1. Multi lobar pneumonia. Follow-up to resolution recommended. 2. A 1.7 cm left adrenal adenoma. 3. Small hiatal hernia. 4.  Aortic Atherosclerosis (ICD10-I70.0). Electronically Signed   By: Anner Crete M.D.   On: 07/24/2022 15:37   DG Chest Port 1 View  Result Date: 07/24/2022 CLINICAL DATA:  Cough. EXAM: PORTABLE CHEST 1 VIEW COMPARISON:  March 05, 2022. FINDINGS: Stable cardiac silhouette. Rounded left perihilar density is noted. Bilateral peribronchial thickening is again noted. Bony thorax is unremarkable. IMPRESSION: Bilateral peribronchial thickening is noted suggesting some degree of bronchitis. Rounded left perihilar density is now noted; CT scan of the chest with Intravenous contrast is recommended to rule out possible mass. Electronically Signed   By: Marijo Conception M.D.   On: 07/24/2022 14:26      LOS: 2 days    Flora Lipps, MD [facility] Available via Epic secure chat 7am-7pm After these hours, please refer to coverage provider listed on [email or web address] 07/26/2022, 11:23 AM

## 2022-07-27 ENCOUNTER — Other Ambulatory Visit (HOSPITAL_COMMUNITY): Payer: Self-pay

## 2022-07-27 DIAGNOSIS — R6520 Severe sepsis without septic shock: Secondary | ICD-10-CM | POA: Diagnosis not present

## 2022-07-27 DIAGNOSIS — E7800 Pure hypercholesterolemia, unspecified: Secondary | ICD-10-CM | POA: Diagnosis not present

## 2022-07-27 DIAGNOSIS — N179 Acute kidney failure, unspecified: Secondary | ICD-10-CM | POA: Diagnosis not present

## 2022-07-27 DIAGNOSIS — A419 Sepsis, unspecified organism: Secondary | ICD-10-CM | POA: Diagnosis not present

## 2022-07-27 DIAGNOSIS — K219 Gastro-esophageal reflux disease without esophagitis: Secondary | ICD-10-CM | POA: Diagnosis not present

## 2022-07-27 DIAGNOSIS — M8580 Other specified disorders of bone density and structure, unspecified site: Secondary | ICD-10-CM | POA: Diagnosis not present

## 2022-07-27 DIAGNOSIS — I10 Essential (primary) hypertension: Secondary | ICD-10-CM | POA: Diagnosis not present

## 2022-07-27 DIAGNOSIS — N183 Chronic kidney disease, stage 3 unspecified: Secondary | ICD-10-CM | POA: Diagnosis not present

## 2022-07-27 DIAGNOSIS — J189 Pneumonia, unspecified organism: Secondary | ICD-10-CM | POA: Diagnosis not present

## 2022-07-27 DIAGNOSIS — J4520 Mild intermittent asthma, uncomplicated: Secondary | ICD-10-CM | POA: Diagnosis not present

## 2022-07-27 DIAGNOSIS — M810 Age-related osteoporosis without current pathological fracture: Secondary | ICD-10-CM | POA: Diagnosis not present

## 2022-07-27 LAB — CBC
HCT: 26.5 % — ABNORMAL LOW (ref 36.0–46.0)
Hemoglobin: 8.9 g/dL — ABNORMAL LOW (ref 12.0–15.0)
MCH: 30.1 pg (ref 26.0–34.0)
MCHC: 33.6 g/dL (ref 30.0–36.0)
MCV: 89.5 fL (ref 80.0–100.0)
Platelets: 269 10*3/uL (ref 150–400)
RBC: 2.96 MIL/uL — ABNORMAL LOW (ref 3.87–5.11)
RDW: 18.5 % — ABNORMAL HIGH (ref 11.5–15.5)
WBC: 16.2 10*3/uL — ABNORMAL HIGH (ref 4.0–10.5)
nRBC: 0 % (ref 0.0–0.2)

## 2022-07-27 LAB — BASIC METABOLIC PANEL
Anion gap: 13 (ref 5–15)
BUN: 31 mg/dL — ABNORMAL HIGH (ref 8–23)
CO2: 15 mmol/L — ABNORMAL LOW (ref 22–32)
Calcium: 7.8 mg/dL — ABNORMAL LOW (ref 8.9–10.3)
Chloride: 107 mmol/L (ref 98–111)
Creatinine, Ser: 1.98 mg/dL — ABNORMAL HIGH (ref 0.44–1.00)
GFR, Estimated: 24 mL/min — ABNORMAL LOW (ref >60)
Glucose, Bld: 75 mg/dL (ref 70–99)
Potassium: 3.9 mmol/L (ref 3.5–5.1)
Sodium: 135 mmol/L (ref 135–145)

## 2022-07-27 LAB — MAGNESIUM: Magnesium: 1.7 mg/dL (ref 1.7–2.4)

## 2022-07-27 MED ORDER — TRIMETHOPRIM 100 MG PO TABS: 100.0000 mg | ORAL_TABLET | Freq: Every day | ORAL | Status: DC

## 2022-07-27 MED ORDER — AMLODIPINE BESYLATE 5 MG PO TABS
5.0000 mg | ORAL_TABLET | Freq: Every day | ORAL | 0 refills | Status: DC
  Filled 2022-07-27 (×2): qty 30, 30d supply, fill #0

## 2022-07-27 MED ORDER — GABAPENTIN 100 MG PO CAPS: 100.0000 mg | ORAL_CAPSULE | Freq: Two times a day (BID) | ORAL | Status: AC

## 2022-07-27 MED ORDER — AMOXICILLIN-POT CLAVULANATE 875-125 MG PO TABS: 1.0000 | ORAL_TABLET | Freq: Two times a day (BID) | ORAL | 0 refills | Status: DC

## 2022-07-27 MED ORDER — AMLODIPINE BESYLATE 5 MG PO TABS: 5.0000 mg | ORAL_TABLET | Freq: Every day | ORAL | 0 refills | Status: DC

## 2022-07-27 MED ORDER — AMOXICILLIN-POT CLAVULANATE 875-125 MG PO TABS
1.0000 | ORAL_TABLET | Freq: Two times a day (BID) | ORAL | 0 refills | Status: AC
  Filled 2022-07-27: qty 4, 2d supply, fill #0

## 2022-07-27 NOTE — Progress Notes (Signed)
Explained discharge instructions to patient. Reviewed follow up appointment and next medication administration times. Also reviewed education. Patient verbalized having an understanding for instructions given. All belongings are in the patient's possession. Stopping by [facility] pharmacy to pick up discharge.  IV and telemetry were removed. CCMD was notified. No other needs verbalized. Transported downstairs to the discharge lounge to await her ride.

## 2022-07-27 NOTE — Plan of Care (Signed)
  Problem: Education: Goal: Knowledge of General Education information will improve Description: Including pain rating scale, medication(s)/side effects and non-pharmacologic comfort measures Outcome: Adequate for Discharge   Problem: Health Behavior/Discharge Planning: Goal: Ability to manage health-related needs will improve Outcome: Adequate for Discharge   Problem: Clinical Measurements: Goal: Ability to maintain clinical measurements within normal limits will improve Outcome: Adequate for Discharge Goal: Will remain free from infection Outcome: Adequate for Discharge Goal: Diagnostic test results will improve Outcome: Adequate for Discharge Goal: Respiratory complications will improve Outcome: Adequate for Discharge Goal: Cardiovascular complication will be avoided Outcome: Adequate for Discharge   Problem: Activity: Goal: Risk for activity intolerance will decrease Outcome: Adequate for Discharge   Problem: Nutrition: Goal: Adequate nutrition will be maintained Outcome: Adequate for Discharge   Problem: Coping: Goal: Level of anxiety will decrease Outcome: Adequate for Discharge   Problem: Elimination: Goal: Will not experience complications related to bowel motility Outcome: Adequate for Discharge Goal: Will not experience complications related to urinary retention Outcome: Adequate for Discharge   Problem: Pain Managment: Goal: General experience of comfort will improve Outcome: Adequate for Discharge   Problem: Safety: Goal: Ability to remain free from injury will improve Outcome: Adequate for Discharge   Problem: Skin Integrity: Goal: Risk for impaired skin integrity will decrease Outcome: Adequate for Discharge   Problem: Acute Rehab PT Goals(only PT should resolve) Goal: Patient Will Transfer Sit To/From Stand Outcome: Adequate for Discharge Goal: Pt Will Ambulate Outcome: Adequate for Discharge Goal: Pt Will Go Up/Down Stairs Outcome: Adequate for  Discharge Goal: Pt/caregiver will Perform Home Exercise Program Outcome: Adequate for Discharge   

## 2022-07-27 NOTE — Plan of Care (Signed)

## 2022-07-27 NOTE — Care Management Important Message (Signed)
Important Message  Patient Details  Name: Melissa Morton MRN: [id number] Date of Birth: January 06, 1934   Medicare Important Message Given:  Yes  Patient left prior to IM delivery a copy will be sent to her home  Orbie Pyo 07/27/2022, 12:54 PM

## 2022-07-27 NOTE — TOC Transition Note (Signed)
Transition of Care [facility]) - CM/SW Discharge Note   Patient Details  Name: Melissa Morton MRN: [id number] Date of Birth: [birth year]  [facility] CM/SW Contact:  Levonne Lapping, RN Phone Number: 07/27/2022, 9:39 AM   Clinical Narrative:     Patient to dc to home .  Home Health PT and OT will be provided by [facility]. Daughter to transport home. No additional TOC needs identified           Patient Goals and CMS Choice      Discharge Placement                         Discharge Plan and Services Additional resources added to the After Visit Summary for                                       Social Determinants of Health (SDOH) Interventions SDOH Screenings   Food Insecurity: No Food Insecurity (07/24/2022)  Housing: Low Risk  (07/24/2022)  Transportation Needs: No Transportation Needs (07/24/2022)  Utilities: Not At Risk (07/24/2022)  Depression (PHQ2-9): Low Risk  (09/08/2021)  Tobacco Use: Medium Risk (07/24/2022)     Readmission Risk Interventions     No data to display

## 2022-07-27 NOTE — Discharge Summary (Signed)
PATIENT DETAILS Name: Melissa Morton Age: 89 y.o. Sex: female Date of Birth: December 23, 1933 MRN: [id number]. Admitting Physician: Lequita Halt, MD [id number], Gwyndolyn Saxon, MD  Admit Date: 07/24/2022 Discharge date: 07/27/2022  Recommendations for Outpatient Follow-up:  Follow up with PCP in 1-2 weeks Please obtain CMP/CBC in one week Please repeat chest imaging in 4 to 6 weeks.   Incidental finding-1.7 cm left adrenal adenoma-to be addressed by PCP.  Admitted From:  Home  Disposition: Home health   Discharge Condition: fair  CODE STATUS:   Code Status: Full Code   Diet recommendation:  Diet Order             Diet - low sodium heart healthy           Diet Heart Room service appropriate? Yes; Fluid consistency: Thin  Diet effective now                    Brief Summary: 89 year old with history of CKD stage IIIb, asthma, GERD, breast cancer in remission-who presented with cough, shortness of breath, generalized weakness-she was found to have pneumonia and subsequently admitted to the hospitalist service.  Brief Hospital Course: Sepsis likely due to multifocal PNA Sepsis physiology present on admission Sepsis physiology resolved with IV antibiotics.  Clinically improved-on room air and requesting discharge this morning. Some suspicion for aspiration-however SLP eval without any overt aspiration issues.  Patient denies any dysphagia or GERD like symptoms. Will transition to Augmentin on discharge-PCP to consider repeating chest imaging in 4 to 6 weeks to document resolution of PNA  AKI on CKD stage IIIb AKI hemodynamically mediated Improving with supportive care Repeat electrolytes in 1 week at PCPs office  HTN BP stable on no antihypertensives (held due to AKI/sepsis physiology) Resume amlodipine-will increase dosage to 5 mg Losartan/diuretics to be resumed once kidney function improves further-repeat electrolytes in 1 week.  GERD PPI  Asthma Not in  exacerbation Continue usual bronchodilator regimen  Recurrent UTI Resume suppressive trimethoprim after patient completes a course of antibiotics for PNA.  1.7 cm left adrenal adenoma Incidental finding on CT imaging Defer further workup to outpatient setting-PCP.  BMI: Estimated body mass index is 28.12 kg/m as calculated from the following:   Height as of this encounter: 5\' 3"  (1.6 m).   Weight as of this encounter: 72 kg.     Discharge Diagnoses:  Principal Problem:   Sepsis ([place]) Active Problems:   Hypertension   PNA (pneumonia)   AKI (acute kidney injury) [facility])   Discharge Instructions:  Activity:  As tolerated with Full fall precautions use walker/cane & assistance as needed   Discharge Instructions     Call MD for:  difficulty breathing, headache or visual disturbances   Complete by: As directed    Diet - low sodium heart healthy   Complete by: As directed    Discharge instructions   Complete by: As directed    Follow with Primary MD  [name], MD in 1-2 weeks  Please ask your primary care practitioner to repeat chest x-ray or CT chest in 4 to 6 weeks to document resolution of pneumonia  Incidental finding on CT chest-left adrenal gland adenoma-please ask your primary care practitioner to initiate further workup.  You will be resumed on amlodipine at a higher dose on discharge.  Lasix/losartan will remain on hold until seen by your primary care practitioner-and after repeat labs show continued improvement of your renal function.  Please get a complete blood count  and chemistry panel checked by your Primary MD at your next visit, and again as instructed by your Primary MD.  Get Medicines reviewed and adjusted: Please take all your medications with you for your next visit with your Primary MD  Laboratory/radiological data: Please request your Primary MD to go over all hospital tests and procedure/radiological results at the follow up, please ask your  Primary MD to get all Hospital records sent to his/her office.  In some cases, they will be blood work, cultures and biopsy results pending at the time of your discharge. Please request that your primary care M.D. follows up on these results.  Also Note the following: If you experience worsening of your admission symptoms, develop shortness of breath, life threatening emergency, suicidal or homicidal thoughts you must seek medical attention immediately by calling 911 or calling your MD immediately  if symptoms less severe.  You must read complete instructions/literature along with all the possible adverse reactions/side effects for all the Medicines you take and that have been prescribed to you. Take any new Medicines after you have completely understood and accpet all the possible adverse reactions/side effects.   Do not drive when taking Pain medications or sleeping medications (Benzodaizepines)  Do not take more than prescribed Pain, Sleep and Anxiety Medications. It is not advisable to combine anxiety,sleep and pain medications without talking with your primary care practitioner  Special Instructions: If you have smoked or chewed Tobacco  in the last 2 yrs please stop smoking, stop any regular Alcohol  and or any Recreational drug use.  Wear Seat belts while driving.  Please note: You were cared for by a hospitalist during your hospital stay. Once you are discharged, your primary care physician will handle any further medical issues. Please note that NO REFILLS for any discharge medications will be authorized once you are discharged, as it is imperative that you return to your primary care physician (or establish a relationship with a primary care physician if you do not have one) for your post hospital discharge needs so that they can reassess your need for medications and monitor your lab values.   Increase activity slowly   Complete by: As directed       Allergies as of 07/27/2022        Reactions   Ciprofloxacin    dizzy   Dilaudid [hydromorphone] Nausea And Vomiting   Nsaids Other (See Comments)   11/09/11-Avoid due to decreased liver function. Can tolerate low dose Aspirin    Prednisone Nausea And Vomiting        Medication List     STOP taking these medications    furosemide 20 MG tablet Commonly known as: LASIX   levofloxacin 500 MG tablet Commonly known as: LEVAQUIN   losartan 100 MG tablet Commonly known as: COZAAR       TAKE these medications    acetaminophen 325 MG tablet Commonly known as: TYLENOL Take 650 mg by mouth every 6 (six) hours as needed for moderate pain (pain).   alendronate 70 MG tablet Commonly known as: FOSAMAX Take 70 mg by mouth once a week. On Wednesdays. Take with a full glass of water on an empty stomach.   ALIGN PREBIOTIC-PROBIOTIC PO Take 1 capsule by mouth daily.   allopurinol 300 MG tablet Commonly known as: ZYLOPRIM Take 300 mg by mouth daily.   amLODipine 5 MG tablet Commonly known as: NORVASC Take 1 tablet (5 mg total) by mouth daily. What changed:  medication strength how  much to take   amoxicillin-clavulanate 875-125 MG tablet Commonly known as: AUGMENTIN Take 1 tablet by mouth 2 (two) times daily for 2 days.   aspirin 81 MG chewable tablet Chew 81 mg by mouth 2 (two) times a week. Monday-Thursday   calcium-vitamin D 500-5 MG-MCG tablet Commonly known as: OSCAL WITH D Take 1 tablet by mouth.   colchicine 0.6 MG tablet Take 0.6 mg by mouth as needed (for gout flare).   DRY EYE RELIEF OP Place 1-2 drops into both eyes daily as needed (Dry eyes).   esomeprazole 40 MG capsule Commonly known as: NEXIUM Take 40 mg by mouth daily. Alternate between famotidine   ferrous sulfate 325 (65 FE) MG EC tablet Take 925 mg by mouth at bedtime.   gabapentin 100 MG capsule Commonly known as: Neurontin Take 1 capsule (100 mg total) by mouth 2 (two) times daily.   montelukast 10 MG tablet Commonly  known as: SINGULAIR Take 10 mg by mouth daily.   multivitamin with minerals Tabs tablet Take 1 tablet by mouth daily.   polycarbophil 625 MG tablet Commonly known as: FIBERCON Take 625 mg by mouth daily.   ProAir HFA 108 (90 Base) MCG/ACT inhaler Generic drug: albuterol Inhale 2 puffs into the lungs as needed for wheezing or shortness of breath.   promethazine 12.5 MG tablet Commonly known as: PHENERGAN Take 12.5 mg by mouth every 6 (six) hours as needed for nausea or vomiting.   Salonpas 3.05-08-08 % Ptch Generic drug: Camphor-Menthol-Methyl Sal Place 1 patch onto the skin daily as needed (Pain).   simvastatin 40 MG tablet Commonly known as: ZOCOR Take 1 tablet (40 mg total) by mouth every evening.   solifenacin 10 MG tablet Commonly known as: VESICARE Take 10 mg by mouth daily.   terbinafine 250 MG tablet Commonly known as: LAMISIL Take 250 mg by mouth daily.   traMADol 50 MG tablet Commonly known as: ULTRAM Take 50 mg by mouth every 6 (six) hours as needed for moderate pain or severe pain (pain).   trimethoprim 100 MG tablet Commonly known as: TRIMPEX Take 1 tablet (100 mg total) by mouth daily. Uti prevention Start taking on: July 30, 2022 What changed: These instructions start on July 30, 2022. If you are unsure what to do until then, ask your doctor or other care provider.   Wixela Inhub 100-50 MCG/ACT Aepb Generic drug: fluticasone-salmeterol Inhale 1 puff into the lungs 2 (two) times daily.        Follow-up Information     [name], MD. Schedule an appointment as soon as possible for a visit in 1 week(s).   Specialty: Family Medicine Contact information: [address] [place] [address] [phone]                Allergies  Allergen Reactions   Ciprofloxacin     dizzy   Dilaudid [Hydromorphone] Nausea And Vomiting   Nsaids Other (See Comments)    11/09/11-Avoid due to decreased liver function. Can tolerate low  dose Aspirin    Prednisone Nausea And Vomiting     Other Procedures/Studies: CT CHEST WO CONTRAST  Result Date: 07/24/2022 CLINICAL DATA:  Pneumonia. EXAM: CT CHEST WITHOUT CONTRAST TECHNIQUE: Multidetector CT imaging of the chest was performed following the standard protocol without IV contrast. RADIATION DOSE REDUCTION: This exam was performed according to the departmental dose-optimization program which includes automated exposure control, adjustment of the mA and/or kV according to patient size and/or use of iterative  reconstruction technique. COMPARISON:  Chest radiograph dated 07/24/2022. FINDINGS: Evaluation of this exam is limited in the absence of intravenous contrast. Cardiovascular: There is no cardiomegaly or pericardial effusion. There is coronary vascular calcification and calcification of the mitral annulus. There is mild atherosclerotic calcification of the thoracic aorta. No aneurysmal dilatation. The central pulmonary arteries are grossly unremarkable on this noncontrast CT. Mediastinum/Nodes: Top-normal subcarinal lymph node measures 10 mm short axis. Mildly enlarged lymph node anterior to the carina measures 13 mm short axis. Several mildly rounded top-normal lymph nodes in the upper mediastinum measure up to 9 mm. There is a small hiatal hernia. The esophagus is grossly unremarkable. No mediastinal fluid collection. Lungs/Pleura: Bilateral airspace opacities with large area of consolidation and air bronchogram in the left lower lobe most consistent with multi lobar pneumonia. Follow-up to resolution recommended. There is no pleural effusion or pneumothorax. The central airways are patent. Upper Abdomen: Cholecystectomy. Bilateral adrenal thickening. A 1.7 cm left adrenal adenoma. Musculoskeletal: Osteopenia with degenerative changes of the spine. No acute osseous pathology. IMPRESSION: 1. Multi lobar pneumonia. Follow-up to resolution recommended. 2. A 1.7 cm left adrenal adenoma. 3.  Small hiatal hernia. 4.  Aortic Atherosclerosis (ICD10-I70.0). Electronically Signed   By: Anner Crete M.D.   On: 07/24/2022 15:37   DG Chest Port 1 View  Result Date: 07/24/2022 CLINICAL DATA:  Cough. EXAM: PORTABLE CHEST 1 VIEW COMPARISON:  March 05, 2022. FINDINGS: Stable cardiac silhouette. Rounded left perihilar density is noted. Bilateral peribronchial thickening is again noted. Bony thorax is unremarkable. IMPRESSION: Bilateral peribronchial thickening is noted suggesting some degree of bronchitis. Rounded left perihilar density is now noted; CT scan of the chest with Intravenous contrast is recommended to rule out possible mass. Electronically Signed   By: Marijo Conception M.D.   On: 07/24/2022 14:26     TODAY-DAY OF DISCHARGE:  Subjective:   Amando Joyner today has no headache,no chest abdominal pain,no new weakness tingling or numbness, feels much better wants to go home today.   Objective:   Blood pressure 134/75, pulse (!) 110, temperature 98.2 F (36.8 C), temperature source Oral, resp. rate 20, height 5\' 3"  (1.6 m), weight 72 kg, SpO2 94 %. No intake or output data in the 24 hours ending 07/27/22 0923 Filed Weights   07/24/22 1308  Weight: 72 kg    Exam: Awake Alert, Oriented *3, No new F.N deficits, Normal affect [place].AT,PERRAL Supple Neck,No JVD, No cervical lymphadenopathy appriciated.  Symmetrical Chest wall movement, Good air movement bilaterally, CTAB RRR,No Gallops,Rubs or new Murmurs, No Parasternal Heave +ve B.Sounds, Abd Soft, Non tender, No organomegaly appriciated, No rebound -guarding or rigidity. No Cyanosis, Clubbing or edema, No new Rash or bruise   PERTINENT RADIOLOGIC STUDIES: No results found.   PERTINENT LAB RESULTS: CBC: Recent Labs    07/26/22 0758 07/27/22 0530  WBC 25.8* 16.2*  HGB 9.1* 8.9*  HCT 28.2* 26.5*  PLT 274 269   CMET CMP     Component Value Date/Time   NA 135 07/27/2022 0530   NA 145 12/03/2016 0958   K 3.9  07/27/2022 0530   K 4.9 12/03/2016 0958   CL 107 07/27/2022 0530   CL 106 09/26/2012 1418   CO2 15 (L) 07/27/2022 0530   CO2 28 12/03/2016 0958   GLUCOSE 75 07/27/2022 0530   GLUCOSE 106 12/03/2016 0958   GLUCOSE 142 (H) 09/26/2012 1418   BUN 31 (H) 07/27/2022 0530   BUN 18.7 12/03/2016 0958   CREATININE 1.98 (  H) 07/27/2022 0530   CREATININE 1.84 (H) 05/12/2022 1032   CREATININE 1.4 (H) 12/03/2016 0958   CALCIUM 7.8 (L) 07/27/2022 0530   CALCIUM 9.9 12/03/2016 0958   PROT 5.7 (L) 07/24/2022 1347   PROT 6.3 (L) 12/03/2016 0958   ALBUMIN 2.9 (L) 07/24/2022 1347   ALBUMIN 3.7 12/03/2016 0958   AST 17 07/24/2022 1347   AST 14 (L) 05/12/2022 1032   AST 17 12/03/2016 0958   ALT 9 07/24/2022 1347   ALT 8 05/12/2022 1032   ALT 11 12/03/2016 0958   ALKPHOS 64 07/24/2022 1347   ALKPHOS 81 12/03/2016 0958   BILITOT 0.6 07/24/2022 1347   BILITOT 0.3 05/12/2022 1032   BILITOT 0.50 12/03/2016 0958   GFRNONAA 24 (L) 07/27/2022 0530   GFRNONAA 26 (L) 05/12/2022 1032   GFRAA 50 (L) 06/25/2019 1055    GFR Estimated Creatinine Clearance: 19.4 mL/min (A) (by C-G formula based on SCr of 1.98 mg/dL (H)). No results for input(s): "LIPASE", "AMYLASE" in the last 72 hours. No results for input(s): "CKTOTAL", "CKMB", "CKMBINDEX", "TROPONINI" in the last 72 hours. Invalid input(s): "POCBNP" No results for input(s): "DDIMER" in the last 72 hours. No results for input(s): "HGBA1C" in the last 72 hours. No results for input(s): "CHOL", "HDL", "LDLCALC", "TRIG", "CHOLHDL", "LDLDIRECT" in the last 72 hours. No results for input(s): "TSH", "T4TOTAL", "T3FREE", "THYROIDAB" in the last 72 hours.  Invalid input(s): "FREET3" No results for input(s): "VITAMINB12", "FOLATE", "FERRITIN", "TIBC", "IRON", "RETICCTPCT" in the last 72 hours. Coags: Recent Labs    07/24/22 1347  INR 1.2   Microbiology: Recent Results (from the past 240 hour(s))  Culture, blood (Routine x 2)     Status: None (Preliminary  result)   Collection Time: 07/24/22  1:25 PM   Specimen: BLOOD  Result Value Ref Range Status   Specimen Description BLOOD RIGHT ANTECUBITAL  Final   Special Requests   Final    BOTTLES DRAWN AEROBIC AND ANAEROBIC Blood Culture adequate volume   Culture   Final    NO GROWTH 2 DAYS Performed at [facility], [address] [place], [place] [address]    Report Status PENDING  Incomplete  Culture, blood (Routine x 2)     Status: None (Preliminary result)   Collection Time: 07/24/22  1:30 PM   Specimen: BLOOD RIGHT HAND  Result Value Ref Range Status   Specimen Description BLOOD RIGHT HAND  Final   Special Requests   Final    BOTTLES DRAWN AEROBIC AND ANAEROBIC Blood Culture results may not be optimal due to an inadequate volume of blood received in culture bottles   Culture   Final    NO GROWTH 2 DAYS Performed at [facility], [place] [address] [place], [place] [address]    Report Status PENDING  Incomplete    FURTHER DISCHARGE INSTRUCTIONS:  Get Medicines reviewed and adjusted: Please take all your medications with you for your next visit with your Primary MD  Laboratory/radiological data: Please request your Primary MD to go over all hospital tests and procedure/radiological results at the follow up, please ask your Primary MD to get all Hospital records sent to his/her office.  In some cases, they will be blood work, cultures and biopsy results pending at the time of your discharge. Please request that your primary care M.D. goes through all the records of your hospital data and follows up on these results.  Also Note the following: If you experience worsening of your  admission symptoms, develop shortness of breath, life threatening emergency, suicidal or homicidal thoughts you must seek medical attention immediately by calling 911 or calling your MD immediately  if symptoms less severe.  You must read complete instructions/literature along with all the possible  adverse reactions/side effects for all the Medicines you take and that have been prescribed to you. Take any new Medicines after you have completely understood and accpet all the possible adverse reactions/side effects.   Do not drive when taking Pain medications or sleeping medications (Benzodaizepines)  Do not take more than prescribed Pain, Sleep and Anxiety Medications. It is not advisable to combine anxiety,sleep and pain medications without talking with your primary care practitioner  Special Instructions: If you have smoked or chewed Tobacco  in the last 2 yrs please stop smoking, stop any regular Alcohol  and or any Recreational drug use.  Wear Seat belts while driving.  Please note: You were cared for by a hospitalist during your hospital stay. Once you are discharged, your primary care physician will handle any further medical issues. Please note that NO REFILLS for any discharge medications will be authorized once you are discharged, as it is imperative that you return to your primary care physician (or establish a relationship with a primary care physician if you do not have one) for your post hospital discharge needs so that they can reassess your need for medications and monitor your lab values.  Total Time spent coordinating discharge including counseling, education and face to face time equals greater than 30 minutes.  SignedOren Binet 07/27/2022 9:23 AM

## 2022-07-29 LAB — CULTURE, BLOOD (ROUTINE X 2)
Culture: NO GROWTH
Culture: NO GROWTH
Special Requests: ADEQUATE

## 2022-08-02 DIAGNOSIS — N183 Chronic kidney disease, stage 3 unspecified: Secondary | ICD-10-CM | POA: Diagnosis not present

## 2022-08-02 DIAGNOSIS — I2510 Atherosclerotic heart disease of native coronary artery without angina pectoris: Secondary | ICD-10-CM | POA: Diagnosis not present

## 2022-08-02 DIAGNOSIS — E278 Other specified disorders of adrenal gland: Secondary | ICD-10-CM | POA: Diagnosis not present

## 2022-08-02 DIAGNOSIS — R300 Dysuria: Secondary | ICD-10-CM | POA: Diagnosis not present

## 2022-08-02 DIAGNOSIS — N3281 Overactive bladder: Secondary | ICD-10-CM | POA: Diagnosis not present

## 2022-08-02 DIAGNOSIS — R600 Localized edema: Secondary | ICD-10-CM | POA: Diagnosis not present

## 2022-08-02 DIAGNOSIS — I700 Atherosclerosis of aorta: Secondary | ICD-10-CM | POA: Diagnosis not present

## 2022-08-03 DIAGNOSIS — I700 Atherosclerosis of aorta: Secondary | ICD-10-CM | POA: Diagnosis not present

## 2022-08-03 DIAGNOSIS — N390 Urinary tract infection, site not specified: Secondary | ICD-10-CM | POA: Diagnosis not present

## 2022-08-03 DIAGNOSIS — J4520 Mild intermittent asthma, uncomplicated: Secondary | ICD-10-CM | POA: Diagnosis not present

## 2022-08-03 DIAGNOSIS — J4489 Other specified chronic obstructive pulmonary disease: Secondary | ICD-10-CM | POA: Diagnosis not present

## 2022-08-03 DIAGNOSIS — C7492 Malignant neoplasm of unspecified part of left adrenal gland: Secondary | ICD-10-CM | POA: Diagnosis not present

## 2022-08-03 DIAGNOSIS — N1832 Chronic kidney disease, stage 3b: Secondary | ICD-10-CM | POA: Diagnosis not present

## 2022-08-03 DIAGNOSIS — K449 Diaphragmatic hernia without obstruction or gangrene: Secondary | ICD-10-CM | POA: Diagnosis not present

## 2022-08-03 DIAGNOSIS — K219 Gastro-esophageal reflux disease without esophagitis: Secondary | ICD-10-CM | POA: Diagnosis not present

## 2022-08-03 DIAGNOSIS — N179 Acute kidney failure, unspecified: Secondary | ICD-10-CM | POA: Diagnosis not present

## 2022-08-03 DIAGNOSIS — M109 Gout, unspecified: Secondary | ICD-10-CM | POA: Diagnosis not present

## 2022-08-03 DIAGNOSIS — I129 Hypertensive chronic kidney disease with stage 1 through stage 4 chronic kidney disease, or unspecified chronic kidney disease: Secondary | ICD-10-CM | POA: Diagnosis not present

## 2022-08-06 DIAGNOSIS — J4489 Other specified chronic obstructive pulmonary disease: Secondary | ICD-10-CM | POA: Diagnosis not present

## 2022-08-06 DIAGNOSIS — I129 Hypertensive chronic kidney disease with stage 1 through stage 4 chronic kidney disease, or unspecified chronic kidney disease: Secondary | ICD-10-CM | POA: Diagnosis not present

## 2022-08-06 DIAGNOSIS — N390 Urinary tract infection, site not specified: Secondary | ICD-10-CM | POA: Diagnosis not present

## 2022-08-06 DIAGNOSIS — N1832 Chronic kidney disease, stage 3b: Secondary | ICD-10-CM | POA: Diagnosis not present

## 2022-08-09 DIAGNOSIS — N390 Urinary tract infection, site not specified: Secondary | ICD-10-CM | POA: Diagnosis not present

## 2022-08-09 DIAGNOSIS — N1832 Chronic kidney disease, stage 3b: Secondary | ICD-10-CM | POA: Diagnosis not present

## 2022-08-09 DIAGNOSIS — J4489 Other specified chronic obstructive pulmonary disease: Secondary | ICD-10-CM | POA: Diagnosis not present

## 2022-08-09 DIAGNOSIS — I129 Hypertensive chronic kidney disease with stage 1 through stage 4 chronic kidney disease, or unspecified chronic kidney disease: Secondary | ICD-10-CM | POA: Diagnosis not present

## 2022-08-11 DIAGNOSIS — N183 Chronic kidney disease, stage 3 unspecified: Secondary | ICD-10-CM | POA: Diagnosis not present

## 2022-08-11 DIAGNOSIS — M810 Age-related osteoporosis without current pathological fracture: Secondary | ICD-10-CM | POA: Diagnosis not present

## 2022-08-11 DIAGNOSIS — J4520 Mild intermittent asthma, uncomplicated: Secondary | ICD-10-CM | POA: Diagnosis not present

## 2022-08-11 DIAGNOSIS — K219 Gastro-esophageal reflux disease without esophagitis: Secondary | ICD-10-CM | POA: Diagnosis not present

## 2022-08-11 DIAGNOSIS — I10 Essential (primary) hypertension: Secondary | ICD-10-CM | POA: Diagnosis not present

## 2022-08-11 DIAGNOSIS — E7800 Pure hypercholesterolemia, unspecified: Secondary | ICD-10-CM | POA: Diagnosis not present

## 2022-08-12 DIAGNOSIS — N390 Urinary tract infection, site not specified: Secondary | ICD-10-CM | POA: Diagnosis not present

## 2022-08-12 DIAGNOSIS — N1832 Chronic kidney disease, stage 3b: Secondary | ICD-10-CM | POA: Diagnosis not present

## 2022-08-12 DIAGNOSIS — I129 Hypertensive chronic kidney disease with stage 1 through stage 4 chronic kidney disease, or unspecified chronic kidney disease: Secondary | ICD-10-CM | POA: Diagnosis not present

## 2022-08-12 DIAGNOSIS — J4489 Other specified chronic obstructive pulmonary disease: Secondary | ICD-10-CM | POA: Diagnosis not present

## 2022-08-13 DIAGNOSIS — D508 Other iron deficiency anemias: Secondary | ICD-10-CM | POA: Diagnosis not present

## 2022-08-13 DIAGNOSIS — K5520 Angiodysplasia of colon without hemorrhage: Secondary | ICD-10-CM | POA: Diagnosis not present

## 2022-08-16 DIAGNOSIS — I129 Hypertensive chronic kidney disease with stage 1 through stage 4 chronic kidney disease, or unspecified chronic kidney disease: Secondary | ICD-10-CM | POA: Diagnosis not present

## 2022-08-16 DIAGNOSIS — N1832 Chronic kidney disease, stage 3b: Secondary | ICD-10-CM | POA: Diagnosis not present

## 2022-08-16 DIAGNOSIS — N390 Urinary tract infection, site not specified: Secondary | ICD-10-CM | POA: Diagnosis not present

## 2022-08-16 DIAGNOSIS — J4489 Other specified chronic obstructive pulmonary disease: Secondary | ICD-10-CM | POA: Diagnosis not present

## 2022-08-18 DIAGNOSIS — R410 Disorientation, unspecified: Secondary | ICD-10-CM | POA: Diagnosis not present

## 2022-08-19 DIAGNOSIS — N390 Urinary tract infection, site not specified: Secondary | ICD-10-CM | POA: Diagnosis not present

## 2022-08-19 DIAGNOSIS — N1832 Chronic kidney disease, stage 3b: Secondary | ICD-10-CM | POA: Diagnosis not present

## 2022-08-19 DIAGNOSIS — I129 Hypertensive chronic kidney disease with stage 1 through stage 4 chronic kidney disease, or unspecified chronic kidney disease: Secondary | ICD-10-CM | POA: Diagnosis not present

## 2022-08-19 DIAGNOSIS — J4489 Other specified chronic obstructive pulmonary disease: Secondary | ICD-10-CM | POA: Diagnosis not present

## 2022-08-22 DIAGNOSIS — N390 Urinary tract infection, site not specified: Secondary | ICD-10-CM | POA: Diagnosis not present

## 2022-08-22 DIAGNOSIS — N1832 Chronic kidney disease, stage 3b: Secondary | ICD-10-CM | POA: Diagnosis not present

## 2022-08-22 DIAGNOSIS — J4489 Other specified chronic obstructive pulmonary disease: Secondary | ICD-10-CM | POA: Diagnosis not present

## 2022-08-22 DIAGNOSIS — I129 Hypertensive chronic kidney disease with stage 1 through stage 4 chronic kidney disease, or unspecified chronic kidney disease: Secondary | ICD-10-CM | POA: Diagnosis not present

## 2022-08-24 DIAGNOSIS — D485 Neoplasm of uncertain behavior of skin: Secondary | ICD-10-CM | POA: Diagnosis not present

## 2022-08-24 DIAGNOSIS — D225 Melanocytic nevi of trunk: Secondary | ICD-10-CM | POA: Diagnosis not present

## 2022-08-24 DIAGNOSIS — D0461 Carcinoma in situ of skin of right upper limb, including shoulder: Secondary | ICD-10-CM | POA: Diagnosis not present

## 2022-08-24 DIAGNOSIS — L578 Other skin changes due to chronic exposure to nonionizing radiation: Secondary | ICD-10-CM | POA: Diagnosis not present

## 2022-08-24 DIAGNOSIS — Z85828 Personal history of other malignant neoplasm of skin: Secondary | ICD-10-CM | POA: Diagnosis not present

## 2022-08-24 DIAGNOSIS — L821 Other seborrheic keratosis: Secondary | ICD-10-CM | POA: Diagnosis not present

## 2022-08-24 DIAGNOSIS — L814 Other melanin hyperpigmentation: Secondary | ICD-10-CM | POA: Diagnosis not present

## 2022-08-30 ENCOUNTER — Other Ambulatory Visit: Payer: Self-pay | Admitting: Family Medicine

## 2022-08-30 ENCOUNTER — Ambulatory Visit
Admission: RE | Admit: 2022-08-30 | Discharge: 2022-08-30 | Disposition: A | Discharge status: Home / Self Care | Payer: 59 | Source: Ambulatory Visit | Attending: Family Medicine | Admitting: Family Medicine

## 2022-08-30 DIAGNOSIS — J189 Pneumonia, unspecified organism: Secondary | ICD-10-CM

## 2022-09-02 DIAGNOSIS — I129 Hypertensive chronic kidney disease with stage 1 through stage 4 chronic kidney disease, or unspecified chronic kidney disease: Secondary | ICD-10-CM | POA: Diagnosis not present

## 2022-09-02 DIAGNOSIS — D508 Other iron deficiency anemias: Secondary | ICD-10-CM | POA: Diagnosis not present

## 2022-09-02 DIAGNOSIS — N1832 Chronic kidney disease, stage 3b: Secondary | ICD-10-CM | POA: Diagnosis not present

## 2022-09-02 DIAGNOSIS — J4489 Other specified chronic obstructive pulmonary disease: Secondary | ICD-10-CM | POA: Diagnosis not present

## 2022-09-02 DIAGNOSIS — N390 Urinary tract infection, site not specified: Secondary | ICD-10-CM | POA: Diagnosis not present

## 2022-10-06 ENCOUNTER — Ambulatory Visit: Payer: 59 | Admitting: Cardiology

## 2022-10-19 DIAGNOSIS — I499 Cardiac arrhythmia, unspecified: Secondary | ICD-10-CM | POA: Diagnosis not present

## 2022-10-19 DIAGNOSIS — M179 Osteoarthritis of knee, unspecified: Secondary | ICD-10-CM | POA: Diagnosis not present

## 2022-10-19 DIAGNOSIS — E7800 Pure hypercholesterolemia, unspecified: Secondary | ICD-10-CM | POA: Diagnosis not present

## 2022-10-19 DIAGNOSIS — N183 Chronic kidney disease, stage 3 unspecified: Secondary | ICD-10-CM | POA: Diagnosis not present

## 2022-10-19 DIAGNOSIS — N390 Urinary tract infection, site not specified: Secondary | ICD-10-CM | POA: Diagnosis not present

## 2022-10-19 DIAGNOSIS — D508 Other iron deficiency anemias: Secondary | ICD-10-CM | POA: Diagnosis not present

## 2022-10-19 DIAGNOSIS — L309 Dermatitis, unspecified: Secondary | ICD-10-CM | POA: Diagnosis not present

## 2022-10-19 DIAGNOSIS — Z0000 Encounter for general adult medical examination without abnormal findings: Secondary | ICD-10-CM | POA: Diagnosis not present

## 2022-10-19 DIAGNOSIS — I10 Essential (primary) hypertension: Secondary | ICD-10-CM | POA: Diagnosis not present

## 2022-10-19 DIAGNOSIS — Z9181 History of falling: Secondary | ICD-10-CM | POA: Diagnosis not present

## 2022-10-19 DIAGNOSIS — Z853 Personal history of malignant neoplasm of breast: Secondary | ICD-10-CM | POA: Diagnosis not present

## 2022-10-28 DIAGNOSIS — I10 Essential (primary) hypertension: Secondary | ICD-10-CM | POA: Diagnosis not present

## 2022-10-28 DIAGNOSIS — K219 Gastro-esophageal reflux disease without esophagitis: Secondary | ICD-10-CM | POA: Diagnosis not present

## 2022-10-28 DIAGNOSIS — M810 Age-related osteoporosis without current pathological fracture: Secondary | ICD-10-CM | POA: Diagnosis not present

## 2022-10-28 DIAGNOSIS — N183 Chronic kidney disease, stage 3 unspecified: Secondary | ICD-10-CM | POA: Diagnosis not present

## 2022-10-28 DIAGNOSIS — E7800 Pure hypercholesterolemia, unspecified: Secondary | ICD-10-CM | POA: Diagnosis not present

## 2022-10-28 DIAGNOSIS — J4520 Mild intermittent asthma, uncomplicated: Secondary | ICD-10-CM | POA: Diagnosis not present

## 2022-11-22 DIAGNOSIS — N390 Urinary tract infection, site not specified: Secondary | ICD-10-CM | POA: Diagnosis not present

## 2022-12-02 DIAGNOSIS — H6123 Impacted cerumen, bilateral: Secondary | ICD-10-CM | POA: Diagnosis not present

## 2022-12-07 DIAGNOSIS — L578 Other skin changes due to chronic exposure to nonionizing radiation: Secondary | ICD-10-CM | POA: Diagnosis not present

## 2022-12-07 DIAGNOSIS — Z8589 Personal history of malignant neoplasm of other organs and systems: Secondary | ICD-10-CM | POA: Diagnosis not present

## 2023-01-25 ENCOUNTER — Other Ambulatory Visit: Payer: Self-pay | Admitting: Family Medicine

## 2023-01-25 ENCOUNTER — Ambulatory Visit
Admission: RE | Admit: 2023-01-25 | Discharge: 2023-01-25 | Disposition: A | Discharge status: Home / Self Care | Payer: 59 | Source: Ambulatory Visit | Attending: Family Medicine | Admitting: Family Medicine

## 2023-01-25 DIAGNOSIS — S8991XA Unspecified injury of right lower leg, initial encounter: Secondary | ICD-10-CM

## 2023-01-25 DIAGNOSIS — L03115 Cellulitis of right lower limb: Secondary | ICD-10-CM | POA: Diagnosis not present

## 2023-01-25 DIAGNOSIS — R600 Localized edema: Secondary | ICD-10-CM | POA: Diagnosis not present

## 2023-02-14 DIAGNOSIS — R8281 Pyuria: Secondary | ICD-10-CM | POA: Diagnosis not present

## 2023-02-15 DIAGNOSIS — D631 Anemia in chronic kidney disease: Secondary | ICD-10-CM | POA: Diagnosis not present

## 2023-02-15 DIAGNOSIS — M109 Gout, unspecified: Secondary | ICD-10-CM | POA: Diagnosis not present

## 2023-02-15 DIAGNOSIS — N184 Chronic kidney disease, stage 4 (severe): Secondary | ICD-10-CM | POA: Diagnosis not present

## 2023-02-15 DIAGNOSIS — N390 Urinary tract infection, site not specified: Secondary | ICD-10-CM | POA: Diagnosis not present

## 2023-02-15 DIAGNOSIS — N189 Chronic kidney disease, unspecified: Secondary | ICD-10-CM | POA: Diagnosis not present

## 2023-02-15 DIAGNOSIS — I129 Hypertensive chronic kidney disease with stage 1 through stage 4 chronic kidney disease, or unspecified chronic kidney disease: Secondary | ICD-10-CM | POA: Diagnosis not present

## 2023-03-09 ENCOUNTER — Encounter (HOSPITAL_COMMUNITY): Payer: Self-pay

## 2023-03-09 ENCOUNTER — Emergency Department (HOSPITAL_COMMUNITY): Payer: 59

## 2023-03-09 ENCOUNTER — Inpatient Hospital Stay (HOSPITAL_COMMUNITY)
Admission: EM | Admit: 2023-03-09 | Discharge: 2023-03-12 | DRG: 871 | Disposition: A | Discharge status: Home / Self Care | Payer: 59 | Attending: Internal Medicine | Admitting: Internal Medicine

## 2023-03-09 ENCOUNTER — Other Ambulatory Visit: Payer: Self-pay

## 2023-03-09 DIAGNOSIS — R110 Nausea: Secondary | ICD-10-CM | POA: Diagnosis not present

## 2023-03-09 DIAGNOSIS — Z90710 Acquired absence of both cervix and uterus: Secondary | ICD-10-CM | POA: Diagnosis not present

## 2023-03-09 DIAGNOSIS — R0689 Other abnormalities of breathing: Secondary | ICD-10-CM | POA: Diagnosis not present

## 2023-03-09 DIAGNOSIS — T3695XA Adverse effect of unspecified systemic antibiotic, initial encounter: Secondary | ICD-10-CM | POA: Diagnosis not present

## 2023-03-09 DIAGNOSIS — R350 Frequency of micturition: Secondary | ICD-10-CM | POA: Diagnosis present

## 2023-03-09 DIAGNOSIS — R918 Other nonspecific abnormal finding of lung field: Secondary | ICD-10-CM | POA: Diagnosis not present

## 2023-03-09 DIAGNOSIS — E538 Deficiency of other specified B group vitamins: Secondary | ICD-10-CM | POA: Diagnosis present

## 2023-03-09 DIAGNOSIS — Z87440 Personal history of urinary (tract) infections: Secondary | ICD-10-CM | POA: Diagnosis not present

## 2023-03-09 DIAGNOSIS — E876 Hypokalemia: Secondary | ICD-10-CM | POA: Diagnosis present

## 2023-03-09 DIAGNOSIS — D631 Anemia in chronic kidney disease: Secondary | ICD-10-CM | POA: Diagnosis present

## 2023-03-09 DIAGNOSIS — R609 Edema, unspecified: Secondary | ICD-10-CM | POA: Diagnosis not present

## 2023-03-09 DIAGNOSIS — M1712 Unilateral primary osteoarthritis, left knee: Secondary | ICD-10-CM | POA: Diagnosis not present

## 2023-03-09 DIAGNOSIS — Z923 Personal history of irradiation: Secondary | ICD-10-CM | POA: Diagnosis not present

## 2023-03-09 DIAGNOSIS — N1832 Chronic kidney disease, stage 3b: Secondary | ICD-10-CM | POA: Diagnosis present

## 2023-03-09 DIAGNOSIS — R197 Diarrhea, unspecified: Secondary | ICD-10-CM | POA: Diagnosis not present

## 2023-03-09 DIAGNOSIS — Z170 Estrogen receptor positive status [ER+]: Secondary | ICD-10-CM

## 2023-03-09 DIAGNOSIS — J189 Pneumonia, unspecified organism: Secondary | ICD-10-CM | POA: Diagnosis present

## 2023-03-09 DIAGNOSIS — L0390 Cellulitis, unspecified: Secondary | ICD-10-CM | POA: Diagnosis not present

## 2023-03-09 DIAGNOSIS — Z853 Personal history of malignant neoplasm of breast: Secondary | ICD-10-CM

## 2023-03-09 DIAGNOSIS — R410 Disorientation, unspecified: Secondary | ICD-10-CM | POA: Diagnosis not present

## 2023-03-09 DIAGNOSIS — I129 Hypertensive chronic kidney disease with stage 1 through stage 4 chronic kidney disease, or unspecified chronic kidney disease: Secondary | ICD-10-CM | POA: Diagnosis not present

## 2023-03-09 DIAGNOSIS — Z96652 Presence of left artificial knee joint: Secondary | ICD-10-CM | POA: Diagnosis not present

## 2023-03-09 DIAGNOSIS — Z803 Family history of malignant neoplasm of breast: Secondary | ICD-10-CM

## 2023-03-09 DIAGNOSIS — E785 Hyperlipidemia, unspecified: Secondary | ICD-10-CM | POA: Diagnosis present

## 2023-03-09 DIAGNOSIS — Z885 Allergy status to narcotic agent status: Secondary | ICD-10-CM

## 2023-03-09 DIAGNOSIS — Z79899 Other long term (current) drug therapy: Secondary | ICD-10-CM

## 2023-03-09 DIAGNOSIS — S81801A Unspecified open wound, right lower leg, initial encounter: Secondary | ICD-10-CM | POA: Diagnosis not present

## 2023-03-09 DIAGNOSIS — Z1152 Encounter for screening for COVID-19: Secondary | ICD-10-CM | POA: Diagnosis not present

## 2023-03-09 DIAGNOSIS — D509 Iron deficiency anemia, unspecified: Secondary | ICD-10-CM | POA: Diagnosis present

## 2023-03-09 DIAGNOSIS — Z888 Allergy status to other drugs, medicaments and biological substances status: Secondary | ICD-10-CM

## 2023-03-09 DIAGNOSIS — N3001 Acute cystitis with hematuria: Secondary | ICD-10-CM | POA: Diagnosis not present

## 2023-03-09 DIAGNOSIS — Z7983 Long term (current) use of bisphosphonates: Secondary | ICD-10-CM

## 2023-03-09 DIAGNOSIS — A419 Sepsis, unspecified organism: Secondary | ICD-10-CM | POA: Diagnosis not present

## 2023-03-09 DIAGNOSIS — M109 Gout, unspecified: Secondary | ICD-10-CM | POA: Diagnosis not present

## 2023-03-09 DIAGNOSIS — R823 Hemoglobinuria: Secondary | ICD-10-CM | POA: Diagnosis not present

## 2023-03-09 DIAGNOSIS — Z87891 Personal history of nicotine dependence: Secondary | ICD-10-CM

## 2023-03-09 DIAGNOSIS — M19071 Primary osteoarthritis, right ankle and foot: Secondary | ICD-10-CM | POA: Diagnosis not present

## 2023-03-09 DIAGNOSIS — Z8249 Family history of ischemic heart disease and other diseases of the circulatory system: Secondary | ICD-10-CM

## 2023-03-09 DIAGNOSIS — J45909 Unspecified asthma, uncomplicated: Secondary | ICD-10-CM | POA: Diagnosis present

## 2023-03-09 DIAGNOSIS — Z886 Allergy status to analgesic agent status: Secondary | ICD-10-CM

## 2023-03-09 DIAGNOSIS — R8290 Unspecified abnormal findings in urine: Secondary | ICD-10-CM | POA: Diagnosis not present

## 2023-03-09 DIAGNOSIS — Z881 Allergy status to other antibiotic agents status: Secondary | ICD-10-CM

## 2023-03-09 DIAGNOSIS — M1711 Unilateral primary osteoarthritis, right knee: Secondary | ICD-10-CM | POA: Diagnosis not present

## 2023-03-09 DIAGNOSIS — Z743 Need for continuous supervision: Secondary | ICD-10-CM | POA: Diagnosis not present

## 2023-03-09 DIAGNOSIS — L03115 Cellulitis of right lower limb: Secondary | ICD-10-CM | POA: Diagnosis not present

## 2023-03-09 DIAGNOSIS — Z1721 Progesterone receptor positive status: Secondary | ICD-10-CM

## 2023-03-09 DIAGNOSIS — R6520 Severe sepsis without septic shock: Secondary | ICD-10-CM | POA: Diagnosis present

## 2023-03-09 DIAGNOSIS — I499 Cardiac arrhythmia, unspecified: Secondary | ICD-10-CM | POA: Diagnosis not present

## 2023-03-09 DIAGNOSIS — M7989 Other specified soft tissue disorders: Secondary | ICD-10-CM | POA: Diagnosis not present

## 2023-03-09 LAB — URINALYSIS, W/ REFLEX TO CULTURE (INFECTION SUSPECTED)
Bilirubin Urine: NEGATIVE
Glucose, UA: NEGATIVE mg/dL
Ketones, ur: NEGATIVE mg/dL
Nitrite: NEGATIVE
Protein, ur: NEGATIVE mg/dL
Specific Gravity, Urine: 1.005 (ref 1.005–1.030)
pH: 6 (ref 5.0–8.0)

## 2023-03-09 LAB — BRAIN NATRIURETIC PEPTIDE: B Natriuretic Peptide: 278 pg/mL — ABNORMAL HIGH (ref 0.0–100.0)

## 2023-03-09 LAB — COMPREHENSIVE METABOLIC PANEL
ALT: 15 U/L (ref 0–44)
AST: 25 U/L (ref 15–41)
Albumin: 3.5 g/dL (ref 3.5–5.0)
Alkaline Phosphatase: 91 U/L (ref 38–126)
Anion gap: 11 (ref 5–15)
BUN: 27 mg/dL — ABNORMAL HIGH (ref 8–23)
CO2: 21 mmol/L — ABNORMAL LOW (ref 22–32)
Calcium: 8.9 mg/dL (ref 8.9–10.3)
Chloride: 104 mmol/L (ref 98–111)
Creatinine, Ser: 1.31 mg/dL — ABNORMAL HIGH (ref 0.44–1.00)
GFR, Estimated: 39 mL/min — ABNORMAL LOW (ref >60)
Glucose, Bld: 124 mg/dL — ABNORMAL HIGH (ref 70–99)
Potassium: 3.7 mmol/L (ref 3.5–5.1)
Sodium: 136 mmol/L (ref 135–145)
Total Bilirubin: 0.7 mg/dL (ref <1.2)
Total Protein: 6.4 g/dL — ABNORMAL LOW (ref 6.5–8.1)

## 2023-03-09 LAB — RESP PANEL BY RT-PCR (RSV, FLU A&B, COVID)  RVPGX2
Influenza A by PCR: NEGATIVE
Influenza B by PCR: NEGATIVE
Resp Syncytial Virus by PCR: NEGATIVE
SARS Coronavirus 2 by RT PCR: NEGATIVE

## 2023-03-09 LAB — CBC WITH DIFFERENTIAL/PLATELET
Abs Immature Granulocytes: 0.14 10*3/uL — ABNORMAL HIGH (ref 0.00–0.07)
Basophils Absolute: 0.1 10*3/uL (ref 0.0–0.1)
Basophils Relative: 0 %
Eosinophils Absolute: 0 10*3/uL (ref 0.0–0.5)
Eosinophils Relative: 0 %
HCT: 31.3 % — ABNORMAL LOW (ref 36.0–46.0)
Hemoglobin: 10.3 g/dL — ABNORMAL LOW (ref 12.0–15.0)
Immature Granulocytes: 1 %
Lymphocytes Relative: 4 %
Lymphs Abs: 0.9 10*3/uL (ref 0.7–4.0)
MCH: 31 pg (ref 26.0–34.0)
MCHC: 32.9 g/dL (ref 30.0–36.0)
MCV: 94.3 fL (ref 80.0–100.0)
Monocytes Absolute: 1 10*3/uL (ref 0.1–1.0)
Monocytes Relative: 4 %
Neutro Abs: 21.5 10*3/uL — ABNORMAL HIGH (ref 1.7–7.7)
Neutrophils Relative %: 91 %
Platelets: 282 10*3/uL (ref 150–400)
RBC: 3.32 MIL/uL — ABNORMAL LOW (ref 3.87–5.11)
RDW: 15.5 % (ref 11.5–15.5)
WBC: 23.6 10*3/uL — ABNORMAL HIGH (ref 4.0–10.5)
nRBC: 0 % (ref 0.0–0.2)

## 2023-03-09 LAB — TSH: TSH: 0.54 u[IU]/mL (ref 0.350–4.500)

## 2023-03-09 LAB — PROTIME-INR
INR: 1.2 (ref 0.8–1.2)
Prothrombin Time: 15.8 s — ABNORMAL HIGH (ref 11.4–15.2)

## 2023-03-09 LAB — LACTIC ACID, PLASMA
Lactic Acid, Venous: 2.2 mmol/L (ref 0.5–1.9)
Lactic Acid, Venous: 1.2 mmol/L (ref 0.5–1.9)

## 2023-03-09 LAB — APTT: aPTT: 27 s (ref 24–36)

## 2023-03-09 LAB — AMMONIA: Ammonia: 15 umol/L (ref 9–35)

## 2023-03-09 LAB — I-STAT CG4 LACTIC ACID, ED: Lactic Acid, Venous: 0.7 mmol/L (ref 0.5–1.9)

## 2023-03-09 MED ORDER — LACTATED RINGERS IV BOLUS (SEPSIS)
250.0000 mL | Freq: Once | INTRAVENOUS | Status: AC
  Administered 2023-03-09: 250 mL via INTRAVENOUS

## 2023-03-09 MED ORDER — METRONIDAZOLE 500 MG/100ML IV SOLN
500.0000 mg | Freq: Once | INTRAVENOUS | Status: AC
  Administered 2023-03-09: 500 mg via INTRAVENOUS
  Filled 2023-03-09: qty 100

## 2023-03-09 MED ORDER — SENNOSIDES-DOCUSATE SODIUM 8.6-50 MG PO TABS: 1.0000 | ORAL_TABLET | Freq: Every evening | ORAL | Status: DC | PRN

## 2023-03-09 MED ORDER — SODIUM CHLORIDE 0.9 % IV SOLN
2.0000 g | Freq: Once | INTRAVENOUS | Status: AC
  Administered 2023-03-09: 2 g via INTRAVENOUS
  Filled 2023-03-09: qty 12.5

## 2023-03-09 MED ORDER — VANCOMYCIN HCL IN DEXTROSE 1-5 GM/200ML-% IV SOLN: 1000.0000 mg | Freq: Once | INTRAVENOUS | Status: DC

## 2023-03-09 MED ORDER — ACETAMINOPHEN 650 MG RE SUPP: 650.0000 mg | Freq: Four times a day (QID) | RECTAL | Status: DC | PRN

## 2023-03-09 MED ORDER — ONDANSETRON HCL 4 MG PO TABS: 4.0000 mg | ORAL_TABLET | Freq: Four times a day (QID) | ORAL | Status: DC | PRN

## 2023-03-09 MED ORDER — ONDANSETRON HCL 4 MG/2ML IJ SOLN: 4.0000 mg | Freq: Four times a day (QID) | INTRAMUSCULAR | Status: DC | PRN

## 2023-03-09 MED ORDER — ACETAMINOPHEN 325 MG PO TABS
650.0000 mg | ORAL_TABLET | Freq: Four times a day (QID) | ORAL | Status: DC | PRN
  Administered 2023-03-10: 650 mg via ORAL
  Filled 2023-03-09: qty 2

## 2023-03-09 MED ORDER — LACTATED RINGERS IV BOLUS (SEPSIS)
1000.0000 mL | Freq: Once | INTRAVENOUS | Status: AC
  Administered 2023-03-09: 1000 mL via INTRAVENOUS

## 2023-03-09 MED ORDER — ENOXAPARIN SODIUM 30 MG/0.3ML IJ SOSY
30.0000 mg | PREFILLED_SYRINGE | INTRAMUSCULAR | Status: DC
  Administered 2023-03-10 – 2023-03-12 (×3): 30 mg via SUBCUTANEOUS
  Filled 2023-03-09 (×3): qty 0.3

## 2023-03-09 MED ORDER — VANCOMYCIN HCL 1250 MG/250ML IV SOLN
1250.0000 mg | Freq: Once | INTRAVENOUS | Status: AC
  Administered 2023-03-09: 1250 mg via INTRAVENOUS
  Filled 2023-03-09: qty 250

## 2023-03-09 NOTE — Progress Notes (Signed)
[facility] is following code sepsis 

## 2023-03-09 NOTE — Progress Notes (Signed)
A consult was received from an ED physician for vancomycin and cefepime per pharmacy dosing.  The patient's profile has been reviewed for ht/wt/allergies/indication/available labs.    A one time order has been placed for cefepime 2 g IV once + vancomycin 1250 mg IV once.    Further antibiotics/pharmacy consults should be ordered by admitting physician if indicated.                       Thank you, Cindi Carbon, PharmD 03/09/2023  4:51 PM

## 2023-03-09 NOTE — Progress Notes (Signed)
Notified provider of need to order repeat lactic acid. ° °

## 2023-03-09 NOTE — ED Provider Notes (Signed)
[place] EMERGENCY DEPARTMENT AT [facility] Provider Note   CSN: [id number] Arrival date & time: 03/09/23  1634     History  No chief complaint on file.   Melissa Morton is a 89 y.o. female.  The history is provided by the patient, the EMS personnel and medical records. No language interpreter was used.  Illness Location:  Cough, confusion, hypoxia, fever, cellulitis Severity:  Moderate Onset quality:  Gradual Duration:  2 days Timing:  Constant Progression:  Waxing and waning Chronicity:  Recurrent Associated symptoms: congestion, cough, fatigue, fever and rash   Associated symptoms: no abdominal pain, no chest pain, no diarrhea, no headaches, no loss of consciousness, no nausea, no rhinorrhea, no shortness of breath, no vomiting and no wheezing        Home Medications Prior to Admission medications   Medication Sig Start Date End Date Taking? Authorizing Provider  acetaminophen (TYLENOL) 325 MG tablet Take 650 mg by mouth every 6 (six) hours as needed for moderate pain (pain).     [provider]  alendronate (FOSAMAX) 70 MG tablet Take 70 mg by mouth once a week. On Wednesdays. Take with a full glass of water on an empty stomach.    [provider]  allopurinol (ZYLOPRIM) 300 MG tablet Take 300 mg by mouth daily.    [provider]  amLODipine (NORVASC) 5 MG tablet Take 1 tablet (5 mg total) by mouth daily. 07/27/22 08/26/22  GhimireWerner Lean, MD  aspirin 81 MG chewable tablet Chew 81 mg by mouth 2 (two) times a week. Monday-Thursday    [provider]  Bacillus Coagulans-Inulin (ALIGN PREBIOTIC-PROBIOTIC PO) Take 1 capsule by mouth daily.    [provider]  calcium-vitamin D (OSCAL WITH D) 500-5 MG-MCG tablet Take 1 tablet by mouth.    [provider]  Camphor-Menthol-Methyl Sal (SALONPAS) 3.05-08-08 % PTCH Place 1 patch onto the skin daily as needed (Pain).    [provider]   Carboxymethylcellulose Sodium (DRY EYE RELIEF OP) Place 1-2 drops into both eyes daily as needed (Dry eyes).    [provider]  colchicine 0.6 MG tablet Take 0.6 mg by mouth as needed (for gout flare).     [provider]  esomeprazole (NEXIUM) 40 MG capsule Take 40 mg by mouth daily. Alternate between famotidine 10/01/14   [provider]  ferrous sulfate 325 (65 FE) MG EC tablet Take 925 mg by mouth at bedtime.    [provider]  fluticasone-salmeterol (WIXELA INHUB) 100-50 MCG/ACT AEPB Inhale 1 puff into the lungs 2 (two) times daily.    [provider]  gabapentin (NEURONTIN) 100 MG capsule Take 1 capsule (100 mg total) by mouth 2 (two) times daily. 07/27/22   Ghimire, Werner Lean, MD  montelukast (SINGULAIR) 10 MG tablet Take 10 mg by mouth daily. 12/23/21   [provider]  Multiple Vitamin (MULTIVITAMIN WITH MINERALS) TABS tablet Take 1 tablet by mouth daily.    [provider]  polycarbophil (FIBERCON) 625 MG tablet Take 625 mg by mouth daily. Patient not taking: Reported on 05/12/2022    [provider]  PROAIR HFA 108 (90 BASE) MCG/ACT inhaler Inhale 2 puffs into the lungs as needed for wheezing or shortness of breath. 02/04/15   [provider]  promethazine (PHENERGAN) 12.5 MG tablet Take 12.5 mg by mouth every 6 (six) hours as needed for nausea or vomiting. 06/18/19   [provider]  simvastatin (ZOCOR) 40 MG  tablet Take 1 tablet (40 mg total) by mouth every evening. 02/12/22   Joseph Art, DO  solifenacin (VESICARE) 10 MG tablet Take 10 mg by mouth daily.    [provider]  terbinafine (LAMISIL) 250 MG tablet Take 250 mg by mouth daily.    [provider]  traMADol (ULTRAM) 50 MG tablet Take 50 mg by mouth every 6 (six) hours as needed for moderate pain or severe pain (pain).    [provider]  trimethoprim (TRIMPEX) 100 MG tablet Take 1 tablet (100 mg total) by mouth  daily. Uti prevention 07/30/22   Maretta Bees, MD      Allergies    Ciprofloxacin, Dilaudid [hydromorphone], Nsaids, and Prednisone    Review of Systems   Review of Systems  Constitutional:  Positive for chills, fatigue and fever.  HENT:  Positive for congestion. Negative for rhinorrhea.   Eyes:  Negative for visual disturbance.  Respiratory:  Positive for cough. Negative for chest tightness, shortness of breath and wheezing.   Cardiovascular:  Negative for chest pain and palpitations.  Gastrointestinal:  Negative for abdominal pain, constipation, diarrhea, nausea and vomiting.  Genitourinary:  Negative for dysuria and frequency.  Musculoskeletal:  Negative for back pain, neck pain and neck stiffness.  Skin:  Positive for rash and wound.  Neurological:  Negative for loss of consciousness, weakness, light-headedness and headaches.  Psychiatric/Behavioral:  Positive for confusion (not now but per EMS was earlier). Negative for agitation.   All other systems reviewed and are negative.   Physical Exam Updated Vital Signs There were no vitals taken for this visit. Physical Exam Vitals and nursing note reviewed.  Constitutional:      General: She is not in acute distress.    Appearance: She is well-developed. She is not ill-appearing, toxic-appearing or diaphoretic.  HENT:     Head: Normocephalic and atraumatic.     Nose: No congestion or rhinorrhea.     Mouth/Throat:     Mouth: Mucous membranes are moist.     Pharynx: No oropharyngeal exudate or posterior oropharyngeal erythema.  Eyes:     Extraocular Movements: Extraocular movements intact.     Conjunctiva/sclera: Conjunctivae normal.     Pupils: Pupils are equal, round, and reactive to light.  Cardiovascular:     Rate and Rhythm: Regular rhythm. Tachycardia present.     Heart sounds: No murmur heard. Pulmonary:     Effort: No respiratory distress.     Breath sounds: Rhonchi present. No wheezing or rales.  Chest:      Chest wall: No tenderness.  Abdominal:     General: Abdomen is flat.     Palpations: Abdomen is soft.     Tenderness: There is no abdominal tenderness. There is no right CVA tenderness, left CVA tenderness, guarding or rebound.  Musculoskeletal:        General: Swelling and tenderness present.     Cervical back: Neck supple. No tenderness.     Right lower leg: Edema (worse than left) present.     Left lower leg: Edema present.  Skin:    General: Skin is warm and dry.     Capillary Refill: Capillary refill takes less than 2 seconds.     Findings: Erythema and rash present.  Neurological:     General: No focal deficit present.     Mental Status: She is alert.     Sensory: No sensory deficit.     Motor: No weakness.  Psychiatric:  Mood and Affect: Mood normal.     ED Results / Procedures / Treatments   Labs (all labs ordered are listed, but only abnormal results are displayed) Labs Reviewed  COMPREHENSIVE METABOLIC PANEL - Abnormal; Notable for the following components:      Result Value   CO2 21 (*)    Glucose, Bld 124 (*)    BUN 27 (*)    Creatinine, Ser 1.31 (*)    Total Protein 6.4 (*)    GFR, Estimated 39 (*)    All other components within normal limits  CBC WITH DIFFERENTIAL/PLATELET - Abnormal; Notable for the following components:   WBC 23.6 (*)    RBC 3.32 (*)    Hemoglobin 10.3 (*)    HCT 31.3 (*)    Neutro Abs 21.5 (*)    Abs Immature Granulocytes 0.14 (*)    All other components within normal limits  PROTIME-INR - Abnormal; Notable for the following components:   Prothrombin Time 15.8 (*)    All other components within normal limits  URINALYSIS, W/ REFLEX TO CULTURE (INFECTION SUSPECTED) - Abnormal; Notable for the following components:   Color, Urine STRAW (*)    APPearance HAZY (*)    Hgb urine dipstick MODERATE (*)    Leukocytes,Ua MODERATE (*)    Bacteria, UA FEW (*)    All other components within normal limits  BRAIN NATRIURETIC PEPTIDE -  Abnormal; Notable for the following components:   B Natriuretic Peptide 278.0 (*)    All other components within normal limits  LACTIC ACID, PLASMA - Abnormal; Notable for the following components:   Lactic Acid, Venous 2.2 (*)    All other components within normal limits  RESP PANEL BY RT-PCR (RSV, FLU A&B, COVID)  RVPGX2  CULTURE, BLOOD (ROUTINE X 2)  CULTURE, BLOOD (ROUTINE X 2)  URINE CULTURE  APTT  AMMONIA  TSH  LACTIC ACID, PLASMA  LACTIC ACID, PLASMA  I-STAT CG4 LACTIC ACID, ED    EKG None  Radiology DG Chest Port 1 View  Result Date: 03/09/2023 CLINICAL DATA:  Sepsis, possible UTI EXAM: PORTABLE CHEST 1 VIEW COMPARISON:  08/30/2022 FINDINGS: Mild patchy right midlung opacity. Additional mild patchy medial right lower lobe opacity. These findings are suspicious for pneumonia. Left lung is clear.  No pleural effusion or pneumothorax. The heart is normal in size. IMPRESSION: Mild patchy right mid and lower lung opacities, suspicious for pneumonia. Electronically Signed   By: Charline Bills M.D.   On: 03/09/2023 19:22   DG Tibia/Fibula Right  Result Date: 03/09/2023 CLINICAL DATA:  Questionable sepsis with possible urinary tract infection. Right lower leg wound. EXAM: RIGHT TIBIA AND FIBULA - 2 VIEW COMPARISON:  Radiographs 01/25/2023. FINDINGS: The mineralization and alignment are normal. There is no evidence of acute fracture or dislocation. No evidence of osteomyelitis. Tricompartmental degenerative changes are again noted at the knee with a possible small knee joint effusion. Suspected chronic posttraumatic deformity of the distal tibia posteriorly. Midfoot degenerative changes and calcaneal spurring noted. Generalized soft tissue edema is similar to the previous study. No foreign body or soft tissue emphysema identified. Prominent vascular calcifications noted. IMPRESSION: No acute osseous findings or evidence of osteomyelitis. Generalized soft tissue edema. Electronically  Signed   By: Carey Bullocks M.D.   On: 03/09/2023 19:02    Procedures Procedures    CRITICAL CARE Performed by: Canary Brim Raylyn Speckman Total critical care time: 30 minutes Critical care time was exclusive of separately billable procedures and treating other patients.  Critical care was necessary to treat or prevent imminent or life-threatening deterioration. Critical care was time spent personally by me on the following activities: development of treatment plan with patient and/or surrogate as well as nursing, discussions with consultants, evaluation of patient's response to treatment, examination of patient, obtaining history from patient or surrogate, ordering and performing treatments and interventions, ordering and review of laboratory studies, ordering and review of radiographic studies, pulse oximetry and re-evaluation of patient's condition.   Medications Ordered in ED Medications  lactated ringers bolus 1,000 mL (0 mLs Intravenous Stopped 03/09/23 1745)    And  lactated ringers bolus 1,000 mL (0 mLs Intravenous Stopped 03/09/23 2012)    And  lactated ringers bolus 250 mL (0 mLs Intravenous Stopped 03/09/23 2046)  ceFEPIme (MAXIPIME) 2 g in sodium chloride 0.9 % 100 mL IVPB (0 g Intravenous Stopped 03/09/23 1745)  metroNIDAZOLE (FLAGYL) IVPB 500 mg (0 mg Intravenous Stopped 03/09/23 1841)  vancomycin (VANCOREADY) IVPB 1250 mg/250 mL (0 mg Intravenous Stopped 03/09/23 2046)    ED Course/ Medical Decision Making/ A&P                                 Medical Decision Making Amount and/or Complexity of Data Reviewed Labs: ordered. Radiology: ordered. ECG/medicine tests: ordered.  Risk Prescription drug management. Decision regarding hospitalization.    Melissa Morton is a 89 y.o. female with a past medical history significant for frequent UTIs, recent right shin cellulitis/wound, GERD, hypertension, previous diverticulitis, arthritis, hyperlipidemia, and gout who presents with  concern for sepsis from either cellulitis in the leg with worsening redness and swelling, cough with some hypoxia and route, and altered mental status similar to when she has had urinary tract infections in the past.  According to EMS, patient recently was on antibiotics to prevent UTIs but had to stop due to kidney problems.  She then was treated for UTI recently when she has any urinary symptoms they have resolved.  No current dysuria hematuria or frequency but patient has had some chills and confusion similar to when she has had UTIs in the past.  Patient also has had some coughing and EMS found oxygen saturations to be about 87% on room air.  She does not take oxygen at home.  She is now on 2 L on arrival.  She is denying any headache or neck pain and denies any chest pain or abdominal pain.  Denies flank or back pain.  Denies rashes aside from redness warmth and swelling in her right leg that has been worsening today.  She has been on antibiotics for cellulitis of the right shin but family is concerned that the redness is worsening.  No drainage seen acutely.  No trauma.  Patient is alert and oriented and per EMS is now her mental status baseline but was slightly confused earlier.  On exam, lungs had some coarseness and patient was coughing.  Chest abdomen nontender.  She is very warm to the touch and temperature was found to be 101.1.  Chest and abdomen nontender.  Bowel sounds were appreciated.  Flanks and back nontender.  Patient has some tenderness erythema warmth related to a wound on her right shin.  Bandage was taken down and it shows a small wound.  No crepitance or fluctuance but the right leg is warm compared to the left.  There is a mild edema in the left side as well however.  No  focal neurologic deficits initially.  Pupil symmetric and reactive with normal ocular movements.  On arrival, patient is febrile, tachycardic, tachypneic, and hypoxic.  Will activate a code sepsis.  Although patient is  some mild edema, will give fluids per protocol.  Will give antibiotics.  Will look for urinary source given history, chest x-ray and viral swab due to the URI symptoms and cough, and will get other labs.  Will also get x-ray of the shin due to the concern for possible cellulitis.  Will give broad-spectrum antibiotics.  Patient will admission for sepsis once workup is completed.     X-ray of the shin did not show osteomyelitis or subcutaneous gas, suspect cellulitis.  Urinalysis shows UTI and x-ray shows pneumonia.  Lactic acid is rising, will trend.  Patient will admitted to medicine service for further management of sepsis from multiple sources at this time.  Patient agrees with admission.          Final Clinical Impression(s) / ED Diagnoses Final diagnoses:  Sepsis, due to unspecified organism, unspecified whether acute organ dysfunction present (HCC)  Cellulitis, unspecified cellulitis site   Clinical Impression: 1. Sepsis, due to unspecified organism, unspecified whether acute organ dysfunction present (HCC)   2. Cellulitis, unspecified cellulitis site     Disposition: Admit  This note was prepared with assistance of Dragon voice recognition software. Occasional wrong-word or sound-a-like substitutions may have occurred due to the inherent limitations of voice recognition software.      Satin Boal, Canary Brim, MD 03/09/23 2121

## 2023-03-09 NOTE — ED Triage Notes (Signed)
Patient brought in by EMS due to possible UTI. Per EMS report, patient has frequent UTI's, was on long standing antibiotics but was d/c'ed from them about 3 weeks ago due to having some kidney failure. Patient was treated for recent UTI, came off antibiotics recently and family noted some confusion last night. Also has wound to the right lower leg that has been being treated.

## 2023-03-10 ENCOUNTER — Inpatient Hospital Stay (HOSPITAL_COMMUNITY): Payer: 59

## 2023-03-10 DIAGNOSIS — L0390 Cellulitis, unspecified: Secondary | ICD-10-CM

## 2023-03-10 DIAGNOSIS — R609 Edema, unspecified: Secondary | ICD-10-CM | POA: Diagnosis not present

## 2023-03-10 LAB — TECHNOLOGIST SMEAR REVIEW: Plt Morphology: NORMAL

## 2023-03-10 LAB — CBC
HCT: 30.1 % — ABNORMAL LOW (ref 36.0–46.0)
Hemoglobin: 9.3 g/dL — ABNORMAL LOW (ref 12.0–15.0)
MCH: 30 pg (ref 26.0–34.0)
MCHC: 30.9 g/dL (ref 30.0–36.0)
MCV: 97.1 fL (ref 80.0–100.0)
Platelets: 236 10*3/uL (ref 150–400)
RBC: 3.1 MIL/uL — ABNORMAL LOW (ref 3.87–5.11)
RDW: 15.7 % — ABNORMAL HIGH (ref 11.5–15.5)
WBC: 25.4 10*3/uL — ABNORMAL HIGH (ref 4.0–10.5)
nRBC: 0 % (ref 0.0–0.2)

## 2023-03-10 LAB — BASIC METABOLIC PANEL
Anion gap: 12 (ref 5–15)
BUN: 25 mg/dL — ABNORMAL HIGH (ref 8–23)
CO2: 20 mmol/L — ABNORMAL LOW (ref 22–32)
Calcium: 8.4 mg/dL — ABNORMAL LOW (ref 8.9–10.3)
Chloride: 105 mmol/L (ref 98–111)
Creatinine, Ser: 1.26 mg/dL — ABNORMAL HIGH (ref 0.44–1.00)
GFR, Estimated: 41 mL/min — ABNORMAL LOW (ref >60)
Glucose, Bld: 97 mg/dL (ref 70–99)
Potassium: 3.6 mmol/L (ref 3.5–5.1)
Sodium: 137 mmol/L (ref 135–145)

## 2023-03-10 LAB — LACTIC ACID, PLASMA: Lactic Acid, Venous: 0.9 mmol/L (ref 0.5–1.9)

## 2023-03-10 LAB — PHOSPHORUS: Phosphorus: 3.1 mg/dL (ref 2.5–4.6)

## 2023-03-10 LAB — MAGNESIUM: Magnesium: 1.7 mg/dL (ref 1.7–2.4)

## 2023-03-10 LAB — MRSA NEXT GEN BY PCR, NASAL: MRSA by PCR Next Gen: NOT DETECTED

## 2023-03-10 MED ORDER — LACTATED RINGERS IV SOLN: INTRAVENOUS | Status: DC

## 2023-03-10 MED ORDER — GABAPENTIN 100 MG PO CAPS
100.0000 mg | ORAL_CAPSULE | Freq: Two times a day (BID) | ORAL | Status: DC
  Administered 2023-03-10 – 2023-03-12 (×6): 100 mg via ORAL
  Filled 2023-03-10 (×6): qty 1

## 2023-03-10 MED ORDER — AMLODIPINE BESYLATE 5 MG PO TABS
5.0000 mg | ORAL_TABLET | Freq: Every day | ORAL | Status: DC
  Administered 2023-03-10 – 2023-03-12 (×3): 5 mg via ORAL
  Filled 2023-03-10 (×3): qty 1

## 2023-03-10 MED ORDER — VANCOMYCIN HCL IN DEXTROSE 1-5 GM/200ML-% IV SOLN
1000.0000 mg | INTRAVENOUS | Status: DC
  Administered 2023-03-11: 1000 mg via INTRAVENOUS
  Filled 2023-03-10: qty 200

## 2023-03-10 MED ORDER — SODIUM CHLORIDE 0.9 % IV SOLN
2.0000 g | INTRAVENOUS | Status: DC
  Administered 2023-03-10: 2 g via INTRAVENOUS
  Filled 2023-03-10: qty 12.5

## 2023-03-10 MED ORDER — ALLOPURINOL 300 MG PO TABS
300.0000 mg | ORAL_TABLET | Freq: Every day | ORAL | Status: DC
  Administered 2023-03-10 – 2023-03-12 (×3): 300 mg via ORAL
  Filled 2023-03-10 (×4): qty 1

## 2023-03-10 MED ORDER — MONTELUKAST SODIUM 10 MG PO TABS
10.0000 mg | ORAL_TABLET | Freq: Every day | ORAL | Status: DC
  Administered 2023-03-10 – 2023-03-12 (×3): 10 mg via ORAL
  Filled 2023-03-10 (×4): qty 1

## 2023-03-10 MED ORDER — MOMETASONE FURO-FORMOTEROL FUM 100-5 MCG/ACT IN AERO
2.0000 | INHALATION_SPRAY | Freq: Two times a day (BID) | RESPIRATORY_TRACT | Status: DC
  Administered 2023-03-10 – 2023-03-12 (×5): 2 via RESPIRATORY_TRACT
  Filled 2023-03-10: qty 8.8

## 2023-03-10 NOTE — Progress Notes (Signed)
RLE venous duplex has been completed.    Results can be found under chart review under CV PROC. 03/10/2023 10:58 AM Janie Strothman RVT, RDMS

## 2023-03-10 NOTE — ED Notes (Signed)
ED TO INPATIENT HANDOFF REPORT  ED Nurse Name and Phone #: Jillane Po  S Name/Age/Gender Melissa Morton 89 y.o. female Room/Bed: WA16/WA16  Code Status   Code Status: Full Code  Home/SNF/Other Home Patient oriented to: self, place, time, and situation Is this baseline? Yes   Triage Complete: Triage complete  Chief Complaint Sepsis [facility]) [A41.9]  Triage Note Patient brought in by EMS due to possible UTI. Per EMS report, patient has frequent UTI's, was on long standing antibiotics but was d/c'ed from them about 3 weeks ago due to having some kidney failure. Patient was treated for recent UTI, came off antibiotics recently and family noted some confusion last night. Also has wound to the right lower leg that has been being treated.    Allergies Allergies  Allergen Reactions   Ciprofloxacin     dizzy   Dilaudid [Hydromorphone] Nausea And Vomiting   Nsaids Other (See Comments)    11/09/11-Avoid due to decreased liver function. Can tolerate low dose Aspirin    Prednisone Nausea And Vomiting    Level of Care/Admitting Diagnosis ED Disposition     ED Disposition  Admit   Condition  --   Comment  Hospital Area: [facility] [[id number]]  Level of Care: Progressive [102]  Admit to Progressive based on following criteria: MULTISYSTEM THREATS such as stable sepsis, metabolic/electrolyte imbalance with or without encephalopathy that is responding to early treatment.  May admit patient to [facility] or [facility] if equivalent level of care is available:: No  Covid Evaluation: Confirmed COVID Negative  Diagnosis: Sepsis [facility]) [[id number]]  Admitting Physician: Steffanie Rainwater [[id number]]  Attending Physician: Steffanie Rainwater [[id number]]  Certification:: I certify this patient will need inpatient services for at least 2 midnights  Expected Medical Readiness: 03/12/2023          B Medical/Surgery History Past Medical History:  Diagnosis Date   Allergy     seasonal allergies   Arthritis    knee and hands   Breast cancer, left breast (HCC) 12/07/2011   Breast cancer, left breast (HCC) 11/24/11   left breast 4 o'clock bx=invasive ductal ca,grade II,ER/PR=+   Diverticulitis    Full dentures    GERD (gastroesophageal reflux disease)    HOH (hard of hearing)    right ear    Hx of radiation therapy 03/14/12- 05/02/12   left breast 5040 gray in 28 fx, site of presentation lower outer quadrant boosted to 6440 gray   Hyperlipemia    Hypertension    Left knee DJD    PONV (postoperative nausea and vomiting)    has used scop patch-that helped   Reflux    Renal insufficiency 11/09/2011   Shortness of breath    Wears glasses    Past Surgical History:  Procedure Laterality Date   ABDOMINAL HYSTERECTOMY  93   total   BLADDER SUSPENSION     BREAST LUMPECTOMY  01/04/2012   left,invasive ductcl ca, grade II,lloq, lymphovascular and perineural invaion ,DCIS,   CHOLECYSTECTOMY     EYE SURGERY     both cataracts   FRACTURE SURGERY     lt foot   KNEE SURGERY  2000   rt    TONSILLECTOMY     TOTAL KNEE ARTHROPLASTY Left 12/24/2013   Procedure: LEFT TOTAL KNEE ARTHROPLASTY;  Surgeon: Nilda Simmer, MD;  Location: [facility] OR;  Service: Orthopedics;  Laterality: Left;   TUBAL LIGATION       A IV Location/Drains/Wounds Patient Lines/Drains/Airways  Status     Active Line/Drains/Airways     Name Placement date Placement time Site Days   Peripheral IV 03/09/23 20 G Anterior;Right Forearm 03/09/23  1659  Forearm  1   Peripheral IV 03/09/23 20 G Posterior;Right Forearm 03/09/23  1713  Forearm  1            Intake/Output Last 24 hours  Intake/Output Summary (Last 24 hours) at 03/10/2023 0324 Last data filed at 03/09/2023 2046 Gross per 24 hour  Intake 1250 ml  Output --  Net 1250 ml    Labs/Imaging Results for orders placed or performed during the hospital encounter of 03/09/23 (from the past 48 hour(s))  Comprehensive metabolic panel      Status: Abnormal   Collection Time: 03/09/23  4:59 PM  Result Value Ref Range   Sodium 136 135 - 145 mmol/L   Potassium 3.7 3.5 - 5.1 mmol/L   Chloride 104 98 - 111 mmol/L   CO2 21 (L) 22 - 32 mmol/L   Glucose, Bld 124 (H) 70 - 99 mg/dL    Comment: Glucose reference range applies only to samples taken after fasting for at least 8 hours.   BUN 27 (H) 8 - 23 mg/dL   Creatinine, Ser 0.98 (H) 0.44 - 1.00 mg/dL   Calcium 8.9 8.9 - 11.9 mg/dL   Total Protein 6.4 (L) 6.5 - 8.1 g/dL   Albumin 3.5 3.5 - 5.0 g/dL   AST 25 15 - 41 U/L   ALT 15 0 - 44 U/L   Alkaline Phosphatase 91 38 - 126 U/L   Total Bilirubin 0.7 <1.2 mg/dL   GFR, Estimated 39 (L) >60 mL/min    Comment: (NOTE) Calculated using the CKD-EPI Creatinine Equation (2021)    Anion gap 11 5 - 15    Comment: Performed at [facility], [address], [place], [place] [address]  CBC with Differential     Status: Abnormal   Collection Time: 03/09/23  4:59 PM  Result Value Ref Range   WBC 23.6 (H) 4.0 - 10.5 K/uL   RBC 3.32 (L) 3.87 - 5.11 MIL/uL   Hemoglobin 10.3 (L) 12.0 - 15.0 g/dL   HCT 95.6 (L) 21.3 - 08.6 %   MCV 94.3 80.0 - 100.0 fL   MCH 31.0 26.0 - 34.0 pg   MCHC 32.9 30.0 - 36.0 g/dL   RDW 57.8 46.9 - 62.9 %   Platelets 282 150 - 400 K/uL   nRBC 0.0 0.0 - 0.2 %   Neutrophils Relative % 91 %   Neutro Abs 21.5 (H) 1.7 - 7.7 K/uL   Lymphocytes Relative 4 %   Lymphs Abs 0.9 0.7 - 4.0 K/uL   Monocytes Relative 4 %   Monocytes Absolute 1.0 0.1 - 1.0 K/uL   Eosinophils Relative 0 %   Eosinophils Absolute 0.0 0.0 - 0.5 K/uL   Basophils Relative 0 %   Basophils Absolute 0.1 0.0 - 0.1 K/uL   Immature Granulocytes 1 %   Abs Immature Granulocytes 0.14 (H) 0.00 - 0.07 K/uL    Comment: Performed at [facility], [address] [place] [address], [place], [place] [address]  Protime-INR     Status: Abnormal   Collection Time: 03/09/23  4:59 PM  Result Value Ref Range   Prothrombin Time 15.8 (H) 11.4 -  15.2 seconds   INR 1.2 0.8 - 1.2    Comment: (NOTE) INR goal varies based on device and disease states. Performed at [facility], [address], [place], [place] [address]   APTT     Status: None   Collection Time: 03/09/23  4:59 PM  Result Value Ref Range   aPTT 27 24 - 36 seconds    Comment: Performed at [facility], [address], [place], [place] [address]  Brain natriuretic peptide     Status: Abnormal   Collection Time: 03/09/23  4:59 PM  Result Value Ref Range   B Natriuretic Peptide 278.0 (H) 0.0 - 100.0 pg/mL    Comment: Performed at [facility], [address], [place], [place] [address]  Ammonia     Status: None   Collection Time: 03/09/23  4:59 PM  Result Value Ref Range   Ammonia 15 9 - 35 umol/L    Comment: Performed at [facility], [address], [place], [place] [address]  TSH     Status: None   Collection Time: 03/09/23  4:59 PM  Result Value Ref Range   TSH 0.540 0.350 - 4.500 uIU/mL    Comment: Performed by a 3rd Generation assay with a functional sensitivity of <=0.01 uIU/mL. Performed at [facility], [address], [place], [place] [address]   Resp panel by RT-PCR (RSV, Flu A&B, Covid) Anterior Nasal Swab     Status: None   Collection Time: 03/09/23  5:04 PM   Specimen: Anterior Nasal Swab  Result Value Ref Range   SARS Coronavirus 2 by RT PCR NEGATIVE NEGATIVE    Comment: (NOTE) SARS-CoV-2 target nucleic acids are NOT DETECTED.  The SARS-CoV-2 RNA is generally detectable in upper respiratory specimens during the acute phase of infection. The lowest concentration of SARS-CoV-2 viral copies this assay can detect is 138 copies/mL. A negative result does not preclude SARS-Cov-2 infection and should not be used as the sole basis for treatment or other patient management decisions. A negative result may occur with  improper specimen collection/handling,  submission of specimen other than nasopharyngeal swab, presence of viral mutation(s) within the areas targeted by this assay, and inadequate number of viral copies(<138 copies/mL). A negative result must be combined with clinical observations, patient history, and epidemiological information. The expected result is Negative.  Fact Sheet for Patients:  BloggerCourse.com  Fact Sheet for Healthcare Providers:  SeriousBroker.it  This test is no t yet approved or cleared by the [place] FDA and  has been authorized for detection and/or diagnosis of SARS-CoV-2 by FDA under an Emergency Use Authorization (EUA). This EUA will remain  in effect (meaning this test can be used) for the duration of the COVID-19 declaration under Section 564(b)(1) of the Act, 21 U.S.C.section 360bbb-3(b)(1), unless the authorization is terminated  or revoked sooner.       Influenza A by PCR NEGATIVE NEGATIVE   Influenza B by PCR NEGATIVE NEGATIVE    Comment: (NOTE) The Xpert Xpress SARS-CoV-2/FLU/RSV plus assay is intended as an aid in the diagnosis of influenza from Nasopharyngeal swab specimens and should not be used as a sole basis for treatment. Nasal washings and aspirates are unacceptable for Xpert Xpress SARS-CoV-2/FLU/RSV testing.  Fact Sheet for Patients: BloggerCourse.com  Fact Sheet for Healthcare Providers: SeriousBroker.it  This test is not yet approved or cleared by the [place] FDA and has been authorized for detection and/or diagnosis of SARS-CoV-2 by FDA under an Emergency Use Authorization (EUA). This EUA will remain in effect (meaning this test can be used) for the duration of the COVID-19 declaration  under Section 564(b)(1) of the Act, 21 U.S.C. section 360bbb-3(b)(1), unless the authorization is terminated or revoked.     Resp Syncytial Virus by PCR NEGATIVE NEGATIVE     Comment: (NOTE) Fact Sheet for Patients: BloggerCourse.com  Fact Sheet for Healthcare Providers: SeriousBroker.it  This test is not yet approved or cleared by the [place] FDA and has been authorized for detection and/or diagnosis of SARS-CoV-2 by FDA under an Emergency Use Authorization (EUA). This EUA will remain in effect (meaning this test can be used) for the duration of the COVID-19 declaration under Section 564(b)(1) of the Act, 21 U.S.C. section 360bbb-3(b)(1), unless the authorization is terminated or revoked.  Performed at [facility], [address] [place], [place] [address]   I-Stat Lactic Acid, ED     Status: None   Collection Time: 03/09/23  5:09 PM  Result Value Ref Range   Lactic Acid, Venous 0.7 0.5 - 1.9 mmol/L  Urinalysis, w/ Reflex to Culture (Infection Suspected) -Urine, Clean Catch     Status: Abnormal   Collection Time: 03/09/23  6:41 PM  Result Value Ref Range   Specimen Source URINE, CLEAN CATCH    Color, Urine STRAW (A) YELLOW   APPearance HAZY (A) CLEAR   Specific Gravity, Urine 1.005 1.005 - 1.030   pH 6.0 5.0 - 8.0   Glucose, UA NEGATIVE NEGATIVE mg/dL   Hgb urine dipstick MODERATE (A) NEGATIVE   Bilirubin Urine NEGATIVE NEGATIVE   Ketones, ur NEGATIVE NEGATIVE mg/dL   Protein, ur NEGATIVE NEGATIVE mg/dL   Nitrite NEGATIVE NEGATIVE   Leukocytes,Ua MODERATE (A) NEGATIVE   RBC / HPF 0-5 0 - 5 RBC/hpf   WBC, UA 21-50 0 - 5 WBC/hpf    Comment:        Reflex urine culture not performed if WBC <=10, OR if Squamous epithelial cells >5. If Squamous epithelial cells >5 suggest recollection.    Bacteria, UA FEW (A) NONE SEEN   Squamous Epithelial / HPF 0-5 0 - 5 /HPF    Comment: Performed at [facility], [address] [place], [place] [address]  Lactic acid, plasma     Status: Abnormal   Collection Time: 03/09/23  7:37 PM  Result Value Ref Range    Lactic Acid, Venous 2.2 (HH) 0.5 - 1.9 mmol/L    Comment: CRITICAL RESULT CALLED TO, READ BACK BY AND VERIFIED WITH Apryll Hinkle,G RN @ 2025 03/09/23 BY CHILDRESS,E Performed at [facility], [address], [place], [place] [address]   Lactic acid, plasma     Status: None   Collection Time: 03/09/23  9:30 PM  Result Value Ref Range   Lactic Acid, Venous 1.2 0.5 - 1.9 mmol/L    Comment: Performed at [facility], [address], [place], [place] [address]  Lactic acid, plasma     Status: None   Collection Time: 03/09/23 11:50 PM  Result Value Ref Range   Lactic Acid, Venous 0.9 0.5 - 1.9 mmol/L    Comment: Performed at [facility], [address] [place], [place] [address]  MRSA Next Gen by PCR, Nasal     Status: None   Collection Time: 03/10/23  1:09 AM   Specimen: Nasal Mucosa; Nasal Swab  Result Value Ref Range   MRSA by PCR Next Gen NOT DETECTED NOT DETECTED    Comment: (NOTE) The GeneXpert MRSA Assay (FDA approved for NASAL specimens only), is one component of a comprehensive MRSA colonization  surveillance program. It is not intended to diagnose MRSA infection nor to guide or monitor treatment for MRSA infections. Test performance is not FDA approved in patients less than 63 years old. Performed at [facility], [address] [place], [place] [address]    DG Chest Port 1 View  Result Date: 03/09/2023 CLINICAL DATA:  Sepsis, possible UTI EXAM: PORTABLE CHEST 1 VIEW COMPARISON:  08/30/2022 FINDINGS: Mild patchy right midlung opacity. Additional mild patchy medial right lower lobe opacity. These findings are suspicious for pneumonia. Left lung is clear.  No pleural effusion or pneumothorax. The heart is normal in size. IMPRESSION: Mild patchy right mid and lower lung opacities, suspicious for pneumonia. Electronically Signed   By: Charline Bills M.D.   On: 03/09/2023 19:22   DG Tibia/Fibula Right  Result  Date: 03/09/2023 CLINICAL DATA:  Questionable sepsis with possible urinary tract infection. Right lower leg wound. EXAM: RIGHT TIBIA AND FIBULA - 2 VIEW COMPARISON:  Radiographs 01/25/2023. FINDINGS: The mineralization and alignment are normal. There is no evidence of acute fracture or dislocation. No evidence of osteomyelitis. Tricompartmental degenerative changes are again noted at the knee with a possible small knee joint effusion. Suspected chronic posttraumatic deformity of the distal tibia posteriorly. Midfoot degenerative changes and calcaneal spurring noted. Generalized soft tissue edema is similar to the previous study. No foreign body or soft tissue emphysema identified. Prominent vascular calcifications noted. IMPRESSION: No acute osseous findings or evidence of osteomyelitis. Generalized soft tissue edema. Electronically Signed   By: Carey Bullocks M.D.   On: 03/09/2023 19:02    Pending Labs Unresulted Labs (From admission, onward)     Start     Ordered   03/10/23 0500  CBC  Tomorrow morning,   R        03/10/23 0145   03/10/23 0500  Basic metabolic panel  Tomorrow morning,   R        03/10/23 0145   03/10/23 0034  Expectorated Sputum Assessment w Gram Stain, Rflx to Resp Cult  Once,   R        03/10/23 0033   03/09/23 1841  Urine Culture  Once,   R        03/09/23 1841   03/09/23 1648  Blood Culture (routine x 2)  (Septic presentation on arrival (screening labs, nursing and treatment orders for obvious sepsis))  BLOOD CULTURE X 2,   STAT      03/09/23 1649            Vitals/Pain Today's Vitals   03/10/23 0100 03/10/23 0115 03/10/23 0200 03/10/23 0230  BP: (!) 156/71  (!) 151/78 (!) 122/91  Pulse: 86  87 (!) 57  Resp: (!) 26  (!) 27 (!) 25  Temp:  (!) 101.1 F (38.4 C)    TempSrc:      SpO2: 91%  92% 90%  Weight:      Height:      PainSc:        Isolation Precautions No active isolations  Medications Medications  enoxaparin (LOVENOX) injection 30 mg (has no  administration in time range)  acetaminophen (TYLENOL) tablet 650 mg (650 mg Oral Given 03/10/23 0119)    Or  acetaminophen (TYLENOL) suppository 650 mg ( Rectal See Alternative 03/10/23 0119)  senna-docusate (Senokot-S) tablet 1 tablet (has no administration in time range)  ondansetron (ZOFRAN) tablet 4 mg (has no administration in time range)    Or  ondansetron (ZOFRAN) injection 4 mg (has no  administration in time range)  allopurinol (ZYLOPRIM) tablet 300 mg (has no administration in time range)  amLODipine (NORVASC) tablet 5 mg (has no administration in time range)  montelukast (SINGULAIR) tablet 10 mg (has no administration in time range)  gabapentin (NEURONTIN) capsule 100 mg (100 mg Oral Given 03/10/23 0115)  mometasone-formoterol (DULERA) 100-5 MCG/ACT inhaler 2 puff (has no administration in time range)  lactated ringers infusion ( Intravenous New Bag/Given 03/10/23 0120)  ceFEPIme (MAXIPIME) 2 g in sodium chloride 0.9 % 100 mL IVPB (has no administration in time range)  vancomycin (VANCOCIN) IVPB 1000 mg/200 mL premix (has no administration in time range)  lactated ringers bolus 1,000 mL (0 mLs Intravenous Stopped 03/09/23 1745)    And  lactated ringers bolus 1,000 mL (0 mLs Intravenous Stopped 03/09/23 2012)    And  lactated ringers bolus 250 mL (0 mLs Intravenous Stopped 03/09/23 2046)  ceFEPIme (MAXIPIME) 2 g in sodium chloride 0.9 % 100 mL IVPB (0 g Intravenous Stopped 03/09/23 1745)  metroNIDAZOLE (FLAGYL) IVPB 500 mg (0 mg Intravenous Stopped 03/09/23 1841)  vancomycin (VANCOREADY) IVPB 1250 mg/250 mL (0 mg Intravenous Stopped 03/09/23 2046)    Mobility walks with device      R Recommendations: See Admitting Provider Note  Report given to:   Additional Notes:

## 2023-03-10 NOTE — Progress Notes (Signed)
Pharmacy Antibiotic Note  Melissa Morton is a 89 y.o. female admitted on 03/09/2023 with sepsis.  PMH significant for frequent UTIs, recent cellulitis.  Pharmacy has been consulted for Vancomycin and Cefepime dosing.  Plan: Vancomycin 1000 mg IV Q 36 hrs. Goal AUC 400-550.  Expected AUC: 510.9  SCr used: 1.31 Cefepime 2gm IV q24h Follow renal function F/u culture results and sensitivities  Height: 5\' 3"  (160 cm) Weight: 72 kg (158 lb 11.7 oz) IBW/kg (Calculated) : 52.4  Temp (24hrs), Avg:100.3 F (37.9 C), Min:98.6 F (37 C), Max:101.1 F (38.4 C)  Recent Labs  Lab 03/09/23 1659 03/09/23 1709 03/09/23 1937 03/09/23 2130 03/09/23 2350  WBC 23.6*  --   --   --   --   CREATININE 1.31*  --   --   --   --   LATICACIDVEN  --  0.7 2.2* 1.2 0.9    Estimated Creatinine Clearance: 28.8 mL/min (A) (by C-G formula based on SCr of 1.31 mg/dL (H)).    Allergies  Allergen Reactions   Ciprofloxacin     dizzy   Dilaudid [Hydromorphone] Nausea And Vomiting   Nsaids Other (See Comments)    11/09/11-Avoid due to decreased liver function. Can tolerate low dose Aspirin    Prednisone Nausea And Vomiting    Antimicrobials this admission: 11/6 Vancomycin >>   11/6 Cefepime >>   11/6 Metronidazole x 1  Dose adjustments this admission:    Microbiology results: 11/6 BCx:   11/6 UCx:    11/7 MRSA PCR:    Thank you for allowing pharmacy to be a part of this patient's care.  Maryellen Pile, PharmD 03/10/2023 1:21 AM

## 2023-03-10 NOTE — H&P (Signed)
History and Physical    Patient: Melissa Morton:[id number] DOB: [birth year] DOA: 03/09/2023 DOS: the patient was seen and examined on 03/10/2023 PCP: Noberto Retort, MD  Patient coming from: Home  Chief Complaint:  Chief Complaint  Patient presents with   Urinary Frequency   HPI: [name] is a 89 y.o. female with medical history significant of  frequent UTIs, recent right shin cellulitis/wound, GERD, HTN, HLD, previous diverticulitis, arthritis, and gout who presents to the ED via EMS for concern for sepsis secondary to UTI.  Patient with a history of frequent UTIs with last 1 about a month ago.  She was on suppressive trimethoprim but this was stopped about 3 weeks ago due to acute kidney injury. She was noted to be disoriented last night.  Per patient, she has felt sick with nausea and vomiting since yesterday.  She reported having a fever of 101 as well as chills and dry cough.  She denies any headaches, chest pain, shortness of breath, dizziness, dysuria or burning with urination.  Patient also reports right lower extremity wound/cellulitis she sustained a few months ago when she ran into a pit bull.  She reports some pain and swelling of the right leg but thinks the redness is about the same or improving.  ED course: Patient febrile to 101.1 tachycardic to 103, BP 149/87, SpO2 92% on room air.  Labs show creatinine 1.31, K+ 3.7, WBC 23.6, Hgb 10.3, BNP 278, ammonia 15, TSH 0.54, negative COVID and flu test, UA with moderate hemoglobinuria, moderate leuks, WBC 21-50 and few bacteria, lactic acid 2.2->1 0.2->0.9. CXR shows mild patchy right mid and lower lung opacities suspicious for pneumonia.  Right leg x-ray showed generalized soft tissue edema but no acute findings or evidence of osteomyelitis.  Sepsis protocol was initiated in the ED w/ IV vancomycin, cefepime, Flagyl and IV fluids. Hospitalist was consulted for admission.  Review of Systems: As mentioned in the history of  present illness. All other systems reviewed and are negative. Past Medical History:  Diagnosis Date   Allergy    seasonal allergies   Arthritis    knee and hands   Breast cancer, left breast (HCC) 12/07/2011   Breast cancer, left breast (HCC) 11/24/11   left breast 4 o'clock bx=invasive ductal ca,grade II,ER/PR=+   Diverticulitis    Full dentures    GERD (gastroesophageal reflux disease)    HOH (hard of hearing)    right ear    Hx of radiation therapy 03/14/12- 05/02/12   left breast 5040 gray in 28 fx, site of presentation lower outer quadrant boosted to 6440 gray   Hyperlipemia    Hypertension    Left knee DJD    PONV (postoperative nausea and vomiting)    has used scop patch-that helped   Reflux    Renal insufficiency 11/09/2011   Shortness of breath    Wears glasses    Past Surgical History:  Procedure Laterality Date   ABDOMINAL HYSTERECTOMY  89   total   BLADDER SUSPENSION     BREAST LUMPECTOMY  01/04/2012   left,invasive ductcl ca, grade II,lloq, lymphovascular and perineural invaion ,DCIS,   CHOLECYSTECTOMY     EYE SURGERY     both cataracts   FRACTURE SURGERY     lt foot   KNEE SURGERY  2000   rt    TONSILLECTOMY     TOTAL KNEE ARTHROPLASTY Left 12/24/2013   Procedure: LEFT TOTAL KNEE ARTHROPLASTY;  Surgeon: Nilda Simmer,  MD;  Location: [facility] OR;  Service: Orthopedics;  Laterality: Left;   TUBAL LIGATION     Social History:  reports that she quit smoking about 31 years ago. Her smoking use included cigarettes. She started smoking about 51 years ago. She has a 5 pack-year smoking history. She has never used smokeless tobacco. She reports current alcohol use. She reports that she does not use drugs.  Allergies  Allergen Reactions   Ciprofloxacin     dizzy   Dilaudid [Hydromorphone] Nausea And Vomiting   Nsaids Other (See Comments)    11/09/11-Avoid due to decreased liver function. Can tolerate low dose Aspirin    Prednisone Nausea And Vomiting    Family History   Problem Relation Age of Onset   Breast cancer Sister 9   Leukemia Sister    Heart disease Father    Breast cancer Maternal Aunt 42   Lung cancer Cousin        smoker    Prior to Admission medications   Medication Sig Start Date End Date Taking? Authorizing Provider  acetaminophen (TYLENOL) 325 MG tablet Take 650 mg by mouth every 6 (six) hours as needed for moderate pain (pain).     [provider]  alendronate (FOSAMAX) 70 MG tablet Take 70 mg by mouth once a week. On Wednesdays. Take with a full glass of water on an empty stomach.    [provider]  allopurinol (ZYLOPRIM) 300 MG tablet Take 300 mg by mouth daily.    [provider]  amLODipine (NORVASC) 5 MG tablet Take 1 tablet (5 mg total) by mouth daily. 07/27/22 03/09/23  GhimireWerner Lean, MD  aspirin 81 MG chewable tablet Chew 81 mg by mouth 2 (two) times a week. Monday-Thursday    [provider]  Bacillus Coagulans-Inulin (ALIGN PREBIOTIC-PROBIOTIC PO) Take 1 capsule by mouth daily.    [provider]  calcium-vitamin D (OSCAL WITH D) 500-5 MG-MCG tablet Take 1 tablet by mouth.    [provider]  Camphor-Menthol-Methyl Sal (SALONPAS) 3.05-08-08 % PTCH Place 1 patch onto the skin daily as needed (Pain).    [provider]  Carboxymethylcellulose Sodium (DRY EYE RELIEF OP) Place 1-2 drops into both eyes daily as needed (Dry eyes).    [provider]  colchicine 0.6 MG tablet Take 0.6 mg by mouth as needed (for gout flare).     [provider]  esomeprazole (NEXIUM) 40 MG capsule Take 40 mg by mouth daily. Alternate between famotidine 10/01/14   [provider]  ferrous sulfate 325 (65 FE) MG EC tablet Take 925 mg by mouth at bedtime.    [provider]  fluticasone-salmeterol (WIXELA INHUB) 100-50 MCG/ACT AEPB Inhale 1 puff into the lungs 2 (two) times daily.    [provider]  gabapentin (NEURONTIN) 100 MG capsule Take 1  capsule (100 mg total) by mouth 2 (two) times daily. 07/27/22   Ghimire, Werner Lean, MD  montelukast (SINGULAIR) 10 MG tablet Take 10 mg by mouth daily. 12/23/21   [provider]  Multiple Vitamin (MULTIVITAMIN WITH MINERALS) TABS tablet Take 1 tablet by mouth daily.    [provider]  polycarbophil (FIBERCON) 625 MG tablet Take 625 mg by mouth daily. Patient not taking: Reported on 05/12/2022    [provider]  PROAIR HFA 108 (90 BASE) MCG/ACT inhaler Inhale 2 puffs into the lungs as needed for wheezing or shortness of breath. 02/04/15   [provider]  promethazine (PHENERGAN) 12.5  MG tablet Take 12.5 mg by mouth every 6 (six) hours as needed for nausea or vomiting. 06/18/19   [provider]  simvastatin (ZOCOR) 40 MG tablet Take 1 tablet (40 mg total) by mouth every evening. 02/12/22   Joseph Art, DO  solifenacin (VESICARE) 10 MG tablet Take 10 mg by mouth daily.    [provider]  terbinafine (LAMISIL) 250 MG tablet Take 250 mg by mouth daily.    [provider]  traMADol (ULTRAM) 50 MG tablet Take 50 mg by mouth every 6 (six) hours as needed for moderate pain or severe pain (pain).    [provider]  trimethoprim (TRIMPEX) 100 MG tablet Take 1 tablet (100 mg total) by mouth daily. Uti prevention 07/30/22   Maretta Bees, MD    Physical Exam: Vitals:   03/09/23 2030 03/09/23 2048 03/09/23 2100 03/09/23 2115  BP: (!) 148/73  (!) 149/70 135/68  Pulse: 93  95 94  Resp: (!) 26  (!) 24 (!) 25  Temp:  98.6 F (37 C)    TempSrc:  Oral    SpO2: 92%  95% 92%  Weight:      Height:       General: Pleasant, well-appearing elderly woman laying in bed. No acute distress. Head: Normocephalic. Atraumatic. CV: Tachycardia.  Regular rhythm. No murmurs, rubs, or gallops. 1+ BLE edema, R>L Pulmonary: Lungs CTAB. Normal effort. Mild rhonchi. No wheezing or rales. Decreased air movement at the bases. Abdominal: Soft,  nontender, nondistended. Normal bowel sounds. Extremities: Palpable radial and DP pulses. Normal ROM. Skin: Warm and dry. RLE wound w/ granulation tissue, surrounding edema, erythema and tenderness.  No purulent drainage Neuro: A&Ox3. Moves all extremities. Normal sensation. No focal deficit. Psych: Normal mood and affect  Data Reviewed:   creatinine 1.31, K+ 3.7, WBC 23.6, Hgb 10.3, BNP 278, ammonia 15, TSH 0.54, negative COVID and flu test, UA with moderate hemoglobinuria, moderate leuks, WBC 21-50 and few bacteria, lactic acid 2.2->1 0.2->0.9. CXR shows mild patchy right mid and lower lung opacities suspicious for pneumonia.  Right leg x-ray showed generalized soft tissue edema but no acute findings or evidence of osteomyelitis.  Assessment and Plan: NAIDELYN PARRELLA is a 89 y.o. female with medical history significant of  frequent UTIs, recent right shin cellulitis/wound, GERD, HTN, HLD, previous diverticulitis, arthritis, and gout who presents to the ED via EMS for concern for sepsis secondary to UTI and admitted for severe sepsis likely secondary to pneumonia, UTI and cellulitis  # Severe sepsis Patient with a history of frequent UTIs, and sepsis from UTI here for with altered mental status, cough and congestion, fever and cellulitis. Patient meets SIRS criteria with fever, leukocytosis, tachycardia and source of infection.  She is now at baseline mental status and hemodynamically stable.  Etiology of patient's sepsis is multifactorial from pneumonia, cellulitis and possible recurrent UTI. Will continue on broad-spectrum antibiotics and de-escalate pending blood culture.  -IV vancomycin and cefepime -IVLR @ 100cc/hr -Follow-up blood culture and urine culture -Follow-up MRSA screen, sputum culture -Trend CBC, fever curve  # HTN BP elevated with SBP in the 140s to 150s -Continue amlodipine  # Recurrent UTIs She has been taken off trimethoprim few weeks ago -Continue broad-spectrum  antibiotics as above  # CKD 3B Creatinine of 1.31. Kidney function significantly improved compared to the last few months. -Continue IV fluid -Avoid nephrotoxic agents  # Asthma -Dulera and Singulair -Needed DuoNebs  # Gout -Continue allopurinol   Advance  Care Planning:   Code Status: Full Code   Consults: N/A  Family Communication: No family at bedside  Severity of Illness: The appropriate patient status for this patient is INPATIENT. Inpatient status is judged to be reasonable and necessary in order to provide the required intensity of service to ensure the patient's safety. The patient's presenting symptoms, physical exam findings, and initial radiographic and laboratory data in the context of their chronic comorbidities is felt to place them at high risk for further clinical deterioration. Furthermore, it is not anticipated that the patient will be medically stable for discharge from the hospital within 2 midnights of admission.   * I certify that at the point of admission it is my clinical judgment that the patient will require inpatient hospital care spanning beyond 2 midnights from the point of admission due to high intensity of service, high risk for further deterioration and high frequency of surveillance required.*  Author: Steffanie Rainwater, MD 03/10/2023 12:21 AM  For on call review [email or web address].

## 2023-03-10 NOTE — Plan of Care (Signed)
  Problem: Clinical Measurements: Goal: Ability to maintain clinical measurements within normal limits will improve Outcome: Progressing Goal: Will remain free from infection Outcome: Progressing Goal: Diagnostic test results will improve Outcome: Progressing   Problem: Activity: Goal: Risk for activity intolerance will decrease Outcome: Progressing   Problem: Elimination: Goal: Will not experience complications related to urinary retention Outcome: Progressing   Problem: Safety: Goal: Ability to remain free from injury will improve Outcome: Progressing   Problem: Skin Integrity: Goal: Risk for impaired skin integrity will decrease Outcome: Progressing   Problem: Education: Goal: Knowledge of General Education information will improve Description: Including pain rating scale, medication(s)/side effects and non-pharmacologic comfort measures Outcome: Adequate for Discharge   Problem: Health Behavior/Discharge Planning: Goal: Ability to manage health-related needs will improve Outcome: Adequate for Discharge   Problem: Clinical Measurements: Goal: Respiratory complications will improve Outcome: Adequate for Discharge Goal: Cardiovascular complication will be avoided Outcome: Adequate for Discharge   Problem: Nutrition: Goal: Adequate nutrition will be maintained Outcome: Adequate for Discharge   Problem: Coping: Goal: Level of anxiety will decrease Outcome: Adequate for Discharge   Problem: Elimination: Goal: Will not experience complications related to bowel motility Outcome: Adequate for Discharge   Problem: Pain Management: Goal: General experience of comfort will improve Outcome: Adequate for Discharge

## 2023-03-10 NOTE — Consult Note (Addendum)
WOC Nurse Consult Note: Reason for Consult: R lower extremity wound  Wound type: full thickness r/t trauma after running into her dog (no puncture from teeth or nails)  Pressure Injury POA: NA  Measurement: 2 cm x 2 cm  Wound bed:100% dark eschar  Drainage (amount, consistency, odor) none  Periwound: R leg is edematous, mildly erythematous around wound and slightly warm to touch; family member says the right leg stays a little edematous always (patient receiving antibiotics to cover possible cellulitis) Dressing procedure/placement/frequency: We discussed Medihoney for debridement versus Xeroform.  Since the necrotic tissue is eschar in appearance will try Xeroform (which daughter has used in the past) and see if this will dry up and peel off.  Patient had been seen by primary MD who did not recommend any treatment.   Clean R lower anterior leg wound with NS, apply Xeroform gauze to wound bed daily [facility] [place] [phone]), cover with silicone foam.  May lift foam daily to replace Xeroform.  Change foam q3 days and prn soiling.   Did discuss with daughter could change Xeroform every other day and foam q3 days at home.   Patient and daughter in agreement with this treatment. Bedside nurse in room at time of visit.   WOC team will not follow. Re-consult if further needs arise.   Thank you,    Priscella Mann MSN, RN-BC, [employer] [phone]

## 2023-03-10 NOTE — Progress Notes (Addendum)
PROGRESS NOTE  Melissa Morton DGL:[id number] DOB: 03/07/1934 DOA: 03/09/2023 PCP: Noberto Retort, MD  HPI/Recap of past 24 hours: [name] is a 89 y.o. female with medical history significant of recurrent UTIs, recent right shin cellulitis/wound, GERD, HTN, HLD, previous diverticulitis, arthritis, and gout who presents to the ED via EMS for AMS due to concern for sepsis secondary to UTI.  Patient with a history of frequent UTIs with last 1 about a month ago.  She was on suppressive trimethoprim but this was stopped about 3 weeks ago due to acute kidney injury. Reported N/V, fever X 1 day. Patient also reports RLE wound she sustained a few months ago when she ran into a pit bull. In the ED, noted to be febrile to 101.1 tachycardic to 103, negative COVID and flu test, UA with moderate hemoglobinuria, moderate leuks, WBC 21-50 and few bacteria, lactic acid 2.2->1 0.2->0.9. CXR shows mild patchy right mid and lower lung opacities suspicious for pneumonia.  Right leg x-ray showed generalized soft tissue edema but no acute findings or evidence of osteomyelitis. Hospitalist was consulted for admission.    Today, met patient eating breakfast, denies any new complaints.    Assessment/Plan: Principal Problem:   Severe sepsis (HCC) Active Problems:   Community acquired pneumonia of right lung   Cellulitis  Severe sepsis possibly from UTI Vs CAP Vs ??RLE wound infection Severe sepsis criteria with fever, leukocytosis with lactic acidosis Last temp spike was 101 on 11/7, with leukocytosis LA has trended down 2.2--->0.9 UA with mod hemoglobinuria, moderate leuks, WBC 21-50 and few bacteria CXR shows mild patchy right mid and lower lung opacities suspicious for pneumonia Right leg x-ray showed generalized soft tissue edema but no acute findings or evidence of osteomyelitis. UC, BC X 2 pending S/p IVF Continue IV vancomycin and cefepime WOC consult for RLE wound care Daily CBC, monitor  closely  CKD 3B Cr at baseline Daily BMP Avoid nephrotoxic agents  Anemia of chronic disease Hgb at baseline Anemia panel pending Daily CBC   HTN BP now stable Continue amlodipine for now and monitor closely given sepsis   Recurrent UTIs She has been taken off trimethoprim few weeks ago Continue broad-spectrum antibiotics as above   Asthma Dulera and Singulair DuoNebs   Gout Continue allopurinol     Estimated body mass index is 25.58 kg/m as calculated from the following:   Height as of this encounter: 5\' 3"  (1.6 m).   Weight as of this encounter: 65.5 kg.     Code Status: Full  Family Communication: None at bedside  Disposition Plan: Status is: Inpatient Remains inpatient appropriate because: level of care      Consultants: None  Procedures: None  Antimicrobials: Vancomycin Cefepime  DVT prophylaxis: Lovenox   Objective: Vitals:   03/10/23 0330 03/10/23 0356 03/10/23 0756 03/10/23 0906  BP: (!) 149/57 138/71 133/81   Pulse: 65 89 78   Resp: 20 14 16    Temp:  99.8 F (37.7 C) 98 F (36.7 C)   TempSrc:  Oral Oral   SpO2: 92% 94% 96% 95%  Weight:  65.5 kg    Height:  5\' 3"  (1.6 m)      Intake/Output Summary (Last 24 hours) at 03/10/2023 1147 Last data filed at 03/10/2023 1100 Gross per 24 hour  Intake 1725.12 ml  Output --  Net 1725.12 ml   Filed Weights   03/09/23 1641 03/10/23 0356  Weight: 72 kg 65.5 kg    Exam:  General: NAD  Cardiovascular: S1, S2 present Respiratory: CTAB Abdomen: Soft, nontender, nondistended, bowel sounds present Musculoskeletal: No bilateral pedal edema noted, RLE wound with no purulent discharge. Noted black ?eschar Skin: Normal Psychiatry: Normal mood     Data Reviewed: CBC: Recent Labs  Lab 03/09/23 1659 03/10/23 0519  WBC 23.6* 25.4*  NEUTROABS 21.5*  --   HGB 10.3* 9.3*  HCT 31.3* 30.1*  MCV 94.3 97.1  PLT 282 236   Basic Metabolic Panel: Recent Labs  Lab 03/09/23 1659  03/10/23 0519  NA 136 137  K 3.7 3.6  CL 104 105  CO2 21* 20*  GLUCOSE 124* 97  BUN 27* 25*  CREATININE 1.31* 1.26*  CALCIUM 8.9 8.4*  MG  --  1.7  PHOS  --  3.1   GFR: Estimated Creatinine Clearance: 28.6 mL/min (A) (by C-G formula based on SCr of 1.26 mg/dL (H)). Liver Function Tests: Recent Labs  Lab 03/09/23 1659  AST 25  ALT 15  ALKPHOS 91  BILITOT 0.7  PROT 6.4*  ALBUMIN 3.5   No results for input(s): "LIPASE", "AMYLASE" in the last 168 hours. Recent Labs  Lab 03/09/23 1659  AMMONIA 15   Coagulation Profile: Recent Labs  Lab 03/09/23 1659  INR 1.2   Cardiac Enzymes: No results for input(s): "CKTOTAL", "CKMB", "CKMBINDEX", "TROPONINI" in the last 168 hours. BNP (last 3 results) No results for input(s): "PROBNP" in the last 8760 hours. HbA1C: No results for input(s): "HGBA1C" in the last 72 hours. CBG: No results for input(s): "GLUCAP" in the last 168 hours. Lipid Profile: No results for input(s): "CHOL", "HDL", "LDLCALC", "TRIG", "CHOLHDL", "LDLDIRECT" in the last 72 hours. Thyroid Function Tests: Recent Labs    03/09/23 1659  TSH 0.540   Anemia Panel: No results for input(s): "VITAMINB12", "FOLATE", "FERRITIN", "TIBC", "IRON", "RETICCTPCT" in the last 72 hours. Urine analysis:    Component Value Date/Time   COLORURINE STRAW (A) 03/09/2023 1841   APPEARANCEUR HAZY (A) 03/09/2023 1841   LABSPEC 1.005 03/09/2023 1841   LABSPEC 1.010 11/10/2011 1302   PHURINE 6.0 03/09/2023 1841   GLUCOSEU NEGATIVE 03/09/2023 1841   HGBUR MODERATE (A) 03/09/2023 1841   BILIRUBINUR NEGATIVE 03/09/2023 1841   BILIRUBINUR NEGAtive 09/08/2021 1411   BILIRUBINUR Negative 11/10/2011 1302   KETONESUR NEGATIVE 03/09/2023 1841   PROTEINUR NEGATIVE 03/09/2023 1841   UROBILINOGEN 0.2 09/08/2021 1411   UROBILINOGEN 1.0 08/05/2014 1246   NITRITE NEGATIVE 03/09/2023 1841   LEUKOCYTESUR MODERATE (A) 03/09/2023 1841   LEUKOCYTESUR Negative 11/10/2011 1302   Sepsis  Labs: @LABRCNTIP (procalcitonin:4,lacticidven:4)  ) Recent Results (from the past 240 hour(s))  Blood Culture (routine x 2)     Status: None (Preliminary result)   Collection Time: 03/09/23  4:59 PM   Specimen: BLOOD  Result Value Ref Range Status   Specimen Description   Final    BLOOD BLOOD RIGHT FOREARM Performed at [facility], [address], [place], [place] [address]    Special Requests   Final    BOTTLES DRAWN AEROBIC AND ANAEROBIC Blood Culture results may not be optimal due to an excessive volume of blood received in culture bottles Performed at [facility], [address], [place], [place] [address]    Culture   Final    NO GROWTH < 12 HOURS Performed at [facility], [address], [place], [place] [address]    Report Status PENDING  Incomplete  Resp panel by RT-PCR (RSV, Flu A&B, Covid) Anterior Nasal  Swab     Status: None   Collection Time: 03/09/23  5:04 PM   Specimen: Anterior Nasal Swab  Result Value Ref Range Status   SARS Coronavirus 2 by RT PCR NEGATIVE NEGATIVE Final    Comment: (NOTE) SARS-CoV-2 target nucleic acids are NOT DETECTED.  The SARS-CoV-2 RNA is generally detectable in upper respiratory specimens during the acute phase of infection. The lowest concentration of SARS-CoV-2 viral copies this assay can detect is 138 copies/mL. A negative result does not preclude SARS-Cov-2 infection and should not be used as the sole basis for treatment or other patient management decisions. A negative result may occur with  improper specimen collection/handling, submission of specimen other than nasopharyngeal swab, presence of viral mutation(s) within the areas targeted by this assay, and inadequate number of viral copies(<138 copies/mL). A negative result must be combined with clinical observations, patient history, and epidemiological information. The expected result is Negative.  Fact Sheet for Patients:   BloggerCourse.com  Fact Sheet for Healthcare Providers:  SeriousBroker.it  This test is no t yet approved or cleared by the [place] FDA and  has been authorized for detection and/or diagnosis of SARS-CoV-2 by FDA under an Emergency Use Authorization (EUA). This EUA will remain  in effect (meaning this test can be used) for the duration of the COVID-19 declaration under Section 564(b)(1) of the Act, 21 U.S.C.section 360bbb-3(b)(1), unless the authorization is terminated  or revoked sooner.       Influenza A by PCR NEGATIVE NEGATIVE Final   Influenza B by PCR NEGATIVE NEGATIVE Final    Comment: (NOTE) The Xpert Xpress SARS-CoV-2/FLU/RSV plus assay is intended as an aid in the diagnosis of influenza from Nasopharyngeal swab specimens and should not be used as a sole basis for treatment. Nasal washings and aspirates are unacceptable for Xpert Xpress SARS-CoV-2/FLU/RSV testing.  Fact Sheet for Patients: BloggerCourse.com  Fact Sheet for Healthcare Providers: SeriousBroker.it  This test is not yet approved or cleared by the [place] FDA and has been authorized for detection and/or diagnosis of SARS-CoV-2 by FDA under an Emergency Use Authorization (EUA). This EUA will remain in effect (meaning this test can be used) for the duration of the COVID-19 declaration under Section 564(b)(1) of the Act, 21 U.S.C. section 360bbb-3(b)(1), unless the authorization is terminated or revoked.     Resp Syncytial Virus by PCR NEGATIVE NEGATIVE Final    Comment: (NOTE) Fact Sheet for Patients: BloggerCourse.com  Fact Sheet for Healthcare Providers: SeriousBroker.it  This test is not yet approved or cleared by the [place] FDA and has been authorized for detection and/or diagnosis of SARS-CoV-2 by FDA under an Emergency Use  Authorization (EUA). This EUA will remain in effect (meaning this test can be used) for the duration of the COVID-19 declaration under Section 564(b)(1) of the Act, 21 U.S.C. section 360bbb-3(b)(1), unless the authorization is terminated or revoked.  Performed at [facility], [address], [place], [place] [address]   Blood Culture (routine x 2)     Status: None (Preliminary result)   Collection Time: 03/09/23  5:09 PM   Specimen: BLOOD  Result Value Ref Range Status   Specimen Description   Final    BLOOD BLOOD RIGHT FOREARM Performed at [facility], [address] [place], [place] [address]    Special Requests   Final    BOTTLES DRAWN AEROBIC AND ANAEROBIC Blood Culture results may not be optimal due to an excessive volume of blood received  in culture bottles Performed at [facility], [address], [place], [place] [address]    Culture   Final    NO GROWTH < 12 HOURS Performed at [facility], [address], [place], [place] [address]    Report Status PENDING  Incomplete  MRSA Next Gen by PCR, Nasal     Status: None   Collection Time: 03/10/23  1:09 AM   Specimen: Nasal Mucosa; Nasal Swab  Result Value Ref Range Status   MRSA by PCR Next Gen NOT DETECTED NOT DETECTED Final    Comment: (NOTE) The GeneXpert MRSA Assay (FDA approved for NASAL specimens only), is one component of a comprehensive MRSA colonization surveillance program. It is not intended to diagnose MRSA infection nor to guide or monitor treatment for MRSA infections. Test performance is not FDA approved in patients less than 37 years old. Performed at [facility], [address], [place], [place] [address]       Studies: VAS [place] LOWER EXTREMITY VENOUS (DVT) (ONLY [facility] & [facility])  Result Date: 03/10/2023  Lower Venous DVT Study Patient Name:  Melissa Morton  Date of Exam:   03/10/2023 Medical Rec #: [id number]         Accession #:    [id number] Date of Birth: 03/05/1934         Patient Gender: F Patient Age:   56 years Exam Location:  [facility] Procedure:      VAS [place] LOWER EXTREMITY VENOUS (DVT) Referring Phys: PROSPER AMPONSAH --------------------------------------------------------------------------------  Indications: Pain, Edema, and Erythema. Other Indications: Cellulitis. Limitations: Poor ultrasound/tissue interface. Comparison Study: Previous exam on 05/12/2022 was negative for DVT Performing Technologist: Ernestene Mention RVT, RDMS  Examination Guidelines: A complete evaluation includes B-mode imaging, spectral Doppler, color Doppler, and power Doppler as needed of all accessible portions of each vessel. Bilateral testing is considered an integral part of a complete examination. Limited examinations for reoccurring indications may be performed as noted. The reflux portion of the exam is performed with the patient in reverse Trendelenburg.  +---------+---------------+---------+-----------+----------+--------------+ RIGHT    CompressibilityPhasicitySpontaneityPropertiesThrombus Aging +---------+---------------+---------+-----------+----------+--------------+ CFV      Full           Yes      Yes                                 +---------+---------------+---------+-----------+----------+--------------+ SFJ      Full                                                        +---------+---------------+---------+-----------+----------+--------------+ FV Prox  Full           Yes      Yes                                 +---------+---------------+---------+-----------+----------+--------------+ FV Mid   Full           Yes      Yes                                 +---------+---------------+---------+-----------+----------+--------------+ FV DistalFull  Yes      Yes                                 +---------+---------------+---------+-----------+----------+--------------+ PFV       Full                                                        +---------+---------------+---------+-----------+----------+--------------+ POP      Full           Yes      Yes                                 +---------+---------------+---------+-----------+----------+--------------+ PTV      Full                                                        +---------+---------------+---------+-----------+----------+--------------+ PERO     Full                                                        +---------+---------------+---------+-----------+----------+--------------+   +----+---------------+---------+-----------+----------+--------------+ LEFTCompressibilityPhasicitySpontaneityPropertiesThrombus Aging +----+---------------+---------+-----------+----------+--------------+ CFV Full           Yes      Yes                                 +----+---------------+---------+-----------+----------+--------------+    Summary: RIGHT: - There is no evidence of deep vein thrombosis in the lower extremity.  - No cystic structure found in the popliteal fossa. Subcutaneous edema in area of calf and ankle  LEFT: - No evidence of common femoral vein obstruction.   *See table(s) above for measurements and observations.    Preliminary    DG Chest Port 1 View  Result Date: 03/09/2023 CLINICAL DATA:  Sepsis, possible UTI EXAM: PORTABLE CHEST 1 VIEW COMPARISON:  08/30/2022 FINDINGS: Mild patchy right midlung opacity. Additional mild patchy medial right lower lobe opacity. These findings are suspicious for pneumonia. Left lung is clear.  No pleural effusion or pneumothorax. The heart is normal in size. IMPRESSION: Mild patchy right mid and lower lung opacities, suspicious for pneumonia. Electronically Signed   By: Charline Bills M.D.   On: 03/09/2023 19:22   DG Tibia/Fibula Right  Result Date: 03/09/2023 CLINICAL DATA:  Questionable sepsis with possible urinary tract infection. Right lower  leg wound. EXAM: RIGHT TIBIA AND FIBULA - 2 VIEW COMPARISON:  Radiographs 01/25/2023. FINDINGS: The mineralization and alignment are normal. There is no evidence of acute fracture or dislocation. No evidence of osteomyelitis. Tricompartmental degenerative changes are again noted at the knee with a possible small knee joint effusion. Suspected chronic posttraumatic deformity of the distal tibia posteriorly. Midfoot degenerative changes and calcaneal spurring noted. Generalized soft tissue edema is similar to the previous study. No foreign body or soft tissue emphysema identified. Prominent vascular calcifications  noted. IMPRESSION: No acute osseous findings or evidence of osteomyelitis. Generalized soft tissue edema. Electronically Signed   By: Carey Bullocks M.D.   On: 03/09/2023 19:02    Scheduled Meds:  allopurinol  300 mg Oral Daily   amLODipine  5 mg Oral Daily   enoxaparin (LOVENOX) injection  30 mg Subcutaneous Q24H   gabapentin  100 mg Oral BID   mometasone-formoterol  2 puff Inhalation BID   montelukast  10 mg Oral Daily    Continuous Infusions:  ceFEPime (MAXIPIME) IV     lactated ringers 100 mL/hr at 03/10/23 0930   [START ON 03/11/2023] vancomycin       LOS: 1 day     Briant Cedar, MD [facility]  If 7PM-7AM, please contact night-coverage [email or web address] 03/10/2023, 11:47 AM

## 2023-03-10 NOTE — Progress Notes (Signed)
Mobility Specialist - Progress Note   03/10/23 1515  Mobility  Activity Ambulated with assistance in hallway  Level of Assistance Standby assist, set-up cues, supervision of patient - no hands on  Assistive Device Front wheel walker  Distance Ambulated (ft) 265 ft  Activity Response Tolerated well  Mobility Referral Yes  $Mobility charge 1 Mobility  Mobility Specialist Start Time (ACUTE ONLY) 0125  Mobility Specialist Stop Time (ACUTE ONLY) 0137  Mobility Specialist Time Calculation (min) (ACUTE ONLY) 12 min   Pt received in recliner and agreeable to mobility. No complaints during session. Pt to recliner after session with all needs met.    [facility]

## 2023-03-11 DIAGNOSIS — R6520 Severe sepsis without septic shock: Secondary | ICD-10-CM | POA: Diagnosis not present

## 2023-03-11 DIAGNOSIS — A419 Sepsis, unspecified organism: Secondary | ICD-10-CM | POA: Diagnosis not present

## 2023-03-11 LAB — MAGNESIUM: Magnesium: 1.6 mg/dL — ABNORMAL LOW (ref 1.7–2.4)

## 2023-03-11 LAB — CBC WITH DIFFERENTIAL/PLATELET
Abs Immature Granulocytes: 0.1 10*3/uL — ABNORMAL HIGH (ref 0.00–0.07)
Basophils Absolute: 0.1 10*3/uL (ref 0.0–0.1)
Basophils Relative: 0 %
Eosinophils Absolute: 0.2 10*3/uL (ref 0.0–0.5)
Eosinophils Relative: 1 %
HCT: 25.7 % — ABNORMAL LOW (ref 36.0–46.0)
Hemoglobin: 8.5 g/dL — ABNORMAL LOW (ref 12.0–15.0)
Immature Granulocytes: 1 %
Lymphocytes Relative: 8 %
Lymphs Abs: 1.5 10*3/uL (ref 0.7–4.0)
MCH: 31.1 pg (ref 26.0–34.0)
MCHC: 33.1 g/dL (ref 30.0–36.0)
MCV: 94.1 fL (ref 80.0–100.0)
Monocytes Absolute: 1.2 10*3/uL — ABNORMAL HIGH (ref 0.1–1.0)
Monocytes Relative: 7 %
Neutro Abs: 14.8 10*3/uL — ABNORMAL HIGH (ref 1.7–7.7)
Neutrophils Relative %: 83 %
Platelets: 229 10*3/uL (ref 150–400)
RBC: 2.73 MIL/uL — ABNORMAL LOW (ref 3.87–5.11)
RDW: 15.9 % — ABNORMAL HIGH (ref 11.5–15.5)
WBC: 17.8 10*3/uL — ABNORMAL HIGH (ref 4.0–10.5)
nRBC: 0 % (ref 0.0–0.2)

## 2023-03-11 LAB — IRON AND TIBC
Iron: 15 ug/dL — ABNORMAL LOW (ref 28–170)
Saturation Ratios: 7 % — ABNORMAL LOW (ref 10.4–31.8)
TIBC: 225 ug/dL — ABNORMAL LOW (ref 250–450)
UIBC: 210 ug/dL

## 2023-03-11 LAB — BASIC METABOLIC PANEL
Anion gap: 9 (ref 5–15)
BUN: 25 mg/dL — ABNORMAL HIGH (ref 8–23)
CO2: 22 mmol/L (ref 22–32)
Calcium: 8.2 mg/dL — ABNORMAL LOW (ref 8.9–10.3)
Chloride: 104 mmol/L (ref 98–111)
Creatinine, Ser: 1.15 mg/dL — ABNORMAL HIGH (ref 0.44–1.00)
GFR, Estimated: 46 mL/min — ABNORMAL LOW (ref >60)
Glucose, Bld: 91 mg/dL (ref 70–99)
Potassium: 3.2 mmol/L — ABNORMAL LOW (ref 3.5–5.1)
Sodium: 135 mmol/L (ref 135–145)

## 2023-03-11 LAB — URINE CULTURE

## 2023-03-11 LAB — FOLATE: Folate: 20.2 ng/mL (ref >5.9)

## 2023-03-11 LAB — FERRITIN: Ferritin: 90 ng/mL (ref 11–307)

## 2023-03-11 LAB — VITAMIN B12: Vitamin B-12: 173 pg/mL — ABNORMAL LOW (ref 180–914)

## 2023-03-11 MED ORDER — SODIUM CHLORIDE 0.9 % IV SOLN
2.0000 g | Freq: Every day | INTRAVENOUS | Status: DC
  Administered 2023-03-11 – 2023-03-12 (×2): 2 g via INTRAVENOUS
  Filled 2023-03-11 (×2): qty 20

## 2023-03-11 MED ORDER — VITAMIN B-12 1000 MCG PO TABS
1000.0000 ug | ORAL_TABLET | Freq: Every day | ORAL | Status: DC
  Administered 2023-03-12: 1000 ug via ORAL
  Filled 2023-03-11: qty 1

## 2023-03-11 MED ORDER — FERROUS SULFATE 325 (65 FE) MG PO TABS
325.0000 mg | ORAL_TABLET | Freq: Every day | ORAL | Status: DC
  Administered 2023-03-11 – 2023-03-12 (×2): 325 mg via ORAL
  Filled 2023-03-11 (×2): qty 1

## 2023-03-11 MED ORDER — CYANOCOBALAMIN 1000 MCG/ML IJ SOLN
1000.0000 ug | Freq: Once | INTRAMUSCULAR | Status: AC
  Administered 2023-03-11: 1000 ug via SUBCUTANEOUS
  Filled 2023-03-11: qty 1

## 2023-03-11 MED ORDER — MAGNESIUM SULFATE 2 GM/50ML IV SOLN
2.0000 g | Freq: Once | INTRAVENOUS | Status: AC
  Administered 2023-03-11: 2 g via INTRAVENOUS
  Filled 2023-03-11: qty 50

## 2023-03-11 MED ORDER — POTASSIUM CHLORIDE CRYS ER 20 MEQ PO TBCR
40.0000 meq | EXTENDED_RELEASE_TABLET | Freq: Once | ORAL | Status: AC
  Administered 2023-03-11: 40 meq via ORAL
  Filled 2023-03-11: qty 2

## 2023-03-11 NOTE — Plan of Care (Signed)

## 2023-03-11 NOTE — Progress Notes (Signed)
Mobility Specialist - Progress Note  Pre-mobility: 111 bpm HR,  During mobility: 146 bpm HR,  Post-mobility: 98 bpm HR,   03/11/23 0941  Mobility  Activity Ambulated with assistance in hallway  Level of Assistance Standby assist, set-up cues, supervision of patient - no hands on  Assistive Device Front wheel walker  Distance Ambulated (ft) 120 ft  Range of Motion/Exercises Active  Activity Response Tolerated well  Mobility Referral Yes  $Mobility charge 1 Mobility  Mobility Specialist Start Time (ACUTE ONLY) 0920  Mobility Specialist Stop Time (ACUTE ONLY) 0941  Mobility Specialist Time Calculation (min) (ACUTE ONLY) 21 min   Pt was found in bed and agreeable to ambulate. Grew fatigued with session. Pt to bathroom afterwards and stated she feels she has diarrhea. Pt was left in chair afterwards with all needs met. Call bell in reach. NT notified.  Billey Chang Mobility Specialist

## 2023-03-11 NOTE — Progress Notes (Signed)
OT Cancellation Note  Patient Details Name: Melissa Morton MRN: [id number] DOB: 08-May-1933   Cancelled Treatment:    Reason Eval/Treat Not Completed: Other (comment). Pt is currently working with PT.   Reuben Likes, OTR/L 03/11/2023, 2:34 PM

## 2023-03-11 NOTE — Evaluation (Signed)
Physical Therapy One Time Evaluation Patient Details Name: Melissa Morton MRN: [id number] DOB: 09-06-1933 Today's Date: 03/11/2023  History of Present Illness  89 y.o. female with medical history significant of recurrent UTIs, recent right shin cellulitis/wound, GERD, HTN, HLD, previous diverticulitis, arthritis, and gout who presents to the ED via EMS for AMS due to concern for sepsis secondary to UTI.  Clinical Impression  Patient evaluated by Physical Therapy with no further acute PT needs identified. All education has been completed and the patient has no further questions.  Pt ambulated in hallway and states she has been ambulating with staff during admission.  Pt declines need for further PT at this time.  No follow-up Physical Therapy or equipment needs. PT is signing off. Thank you for this referral.         If plan is discharge home, recommend the following:     Can travel by private vehicle        Equipment Recommendations None recommended by PT  Recommendations for Other Services       Functional Status Assessment Patient has not had a recent decline in their functional status     Precautions / Restrictions Precautions Precautions: None      Mobility  Bed Mobility               General bed mobility comments: pt in recliner on arrival    Transfers Overall transfer level: Needs assistance Equipment used: Rolling walker (2 wheels) Transfers: Sit to/from Stand Sit to Stand: Supervision                Ambulation/Gait Ambulation/Gait assistance: Supervision Gait Distance (Feet): 200 Feet Assistive device: Rolling walker (2 wheels) Gait Pattern/deviations: Step-through pattern, Decreased stride length       General Gait Details: slow but steady pace with RW  Stairs            Wheelchair Mobility     Tilt Bed    Modified Rankin (Stroke Patients Only)       Balance Overall balance assessment: History of Falls                                            Pertinent Vitals/Pain Pain Assessment Pain Assessment: No/denies pain    Home Living Family/patient expects to be discharged to:: Private residence Living Arrangements: Children Available Help at Discharge: Family;Available 24 hours/day Type of Home: House Home Access: Stairs to enter Entrance Stairs-Rails: Right Entrance Stairs-Number of Steps: 2   Home Layout: One level Home Equipment: Rollator (4 wheels);Shower seat;Wheelchair - manual;Cane - quad Additional Comments: has 3 daughters that can assist as needed    Prior Function Prior Level of Function : Needs assist             Mobility Comments: Mod I using QC vs rollator       Extremity/Trunk Assessment        Lower Extremity Assessment Lower Extremity Assessment: Overall WFL for tasks assessed       Communication   Communication Communication: Hearing impairment  Cognition Arousal: Alert Behavior During Therapy: WFL for tasks assessed/performed Overall Cognitive Status: Within Functional Limits for tasks assessed  General Comments      Exercises     Assessment/Plan    PT Assessment Patient does not need any further PT services  PT Problem List         PT Treatment Interventions      PT Goals (Current goals can be found in the Care Plan section)  Acute Rehab PT Goals PT Goal Formulation: All assessment and education complete, DC therapy    Frequency       Co-evaluation               AM-PAC PT "6 Clicks" Mobility  Outcome Measure Help needed turning from your back to your side while in a flat bed without using bedrails?: None Help needed moving from lying on your back to sitting on the side of a flat bed without using bedrails?: None Help needed moving to and from a bed to a chair (including a wheelchair)?: None Help needed standing up from a chair using your arms (e.g., wheelchair or  bedside chair)?: None Help needed to walk in hospital room?: A Little Help needed climbing 3-5 steps with a railing? : A Little 6 Click Score: 22    End of Session   Activity Tolerance: Patient tolerated treatment well Patient left: in chair;with call bell/phone within reach;with family/visitor present Nurse Communication: Mobility status PT Visit Diagnosis: Difficulty in walking, not elsewhere classified (R26.2)    Time: 5784-6962 PT Time Calculation (min) (ACUTE ONLY): 10 min   Charges:   PT Evaluation $PT Eval Low Complexity: 1 Low   PT General Charges $$ ACUTE PT VISIT: 1 Visit       Paulino Door, DPT Physical Therapist Acute Rehabilitation Services Office: [phone]   Janan Halter P[place] 03/11/2023, 4:04 PM

## 2023-03-11 NOTE — Progress Notes (Addendum)
PROGRESS NOTE  Melissa Morton XFG:[id number] DOB: 1933-05-08 DOA: 03/09/2023 PCP: Noberto Retort, MD  HPI/Recap of past 24 hours: Melissa Morton is a 89 y.o. female with medical history significant of recurrent UTIs, recent right shin cellulitis/wound, GERD, HTN, HLD, previous diverticulitis, arthritis, and gout who presents to the ED via EMS for AMS due to concern for sepsis secondary to UTI.  Patient with a history of frequent UTIs with last 1 about a month ago.  She was on suppressive trimethoprim but this was stopped about 3 weeks ago due to acute kidney injury. Reported N/V, fever X 1 day. Patient also reports RLE wound she sustained a few months ago when she ran into a pit bull. In the ED, noted to be febrile to 101.1 tachycardic to 103, negative COVID and flu test, UA with moderate hemoglobinuria, moderate leuks, WBC 21-50 and few bacteria, lactic acid 2.2->1 0.2->0.9. CXR shows mild patchy right mid and lower lung opacities suspicious for pneumonia.  Right leg x-ray showed generalized soft tissue edema but no acute findings or evidence of osteomyelitis. Hospitalist was consulted for admission.    Today, pt reports some diarrhea, likely 2/2 antibiotics    Assessment/Plan: Principal Problem:   Severe sepsis (HCC) Active Problems:   Community acquired pneumonia of right lung   Cellulitis  Severe sepsis possibly from UTI Vs CAP Vs ??RLE wound infection Severe sepsis criteria with fever, leukocytosis with lactic acidosis Last temp spike was 101 on 11/7, with leukocytosis LA has trended down 2.2--->0.9 UA with mod hemoglobinuria, moderate leuks, WBC 21-50 and few bacteria CXR shows mild patchy right mid and lower lung opacities suspicious for pneumonia Right leg x-ray showed generalized soft tissue edema but no acute findings or evidence of osteomyelitis. UC growing multiple species, BC X 2 NGTD S/p IVF S/p IV vancomycin and cefepime---> switch to ceftriaxone  WOC consult for  RLE wound care Daily CBC, monitor closely  Hypokalemia/hypomagnesemia Replace prn  CKD 3B Cr at baseline Daily BMP Avoid nephrotoxic agents  Anemia of chronic disease Iron def anemia Vit B12 def Hgb at baseline Anemia panel with iron 15, sats 7, Vit B12-->173 Supplement with oral iron, Vit B12 subcu Daily CBC   HTN BP now stable Continue amlodipine   Recurrent UTIs She has been taken off trimethoprim few weeks ago Continue antibiotics as above   Asthma Dulera and Singulair DuoNebs   Gout Continue allopurinol     Estimated body mass index is 25.58 kg/m as calculated from the following:   Height as of this encounter: 5\' 3"  (1.6 m).   Weight as of this encounter: 65.5 kg.     Code Status: Full  Family Communication: None at bedside  Disposition Plan: Status is: Inpatient Remains inpatient appropriate because: level of care      Consultants: None  Procedures: None  Antimicrobials: Ceftriaxone   DVT prophylaxis: Lovenox   Objective: Vitals:   03/10/23 1641 03/10/23 2031 03/11/23 0611 03/11/23 1227  BP: (!) 140/69 (!) 140/82 (!) 143/70 137/85  Pulse: 82 94 92 61  Resp: 20 18 14 16   Temp: 99.7 F (37.6 C) 98.6 F (37 C) 98.6 F (37 C) 98 F (36.7 C)  TempSrc: Oral Oral Oral Oral  SpO2: 96% 93% 92% 97%  Weight:      Height:        Intake/Output Summary (Last 24 hours) at 03/11/2023 1415 Last data filed at 03/10/2023 2200 Gross per 24 hour  Intake 1127.19 ml  Output  200 ml  Net 927.19 ml   Filed Weights   03/09/23 1641 03/10/23 0356  Weight: 72 kg 65.5 kg    Exam: General: NAD  Cardiovascular: S1, S2 present Respiratory: CTAB Abdomen: Soft, nontender, nondistended, bowel sounds present Musculoskeletal: No bilateral pedal edema noted, RLE wound with no purulent discharge. Noted black eschar, now with dressing c/d/i Skin: Normal except as above Psychiatry: Normal mood     Data Reviewed: CBC: Recent Labs  Lab  03/09/23 1659 03/10/23 0519 03/11/23 0534  WBC 23.6* 25.4* 17.8*  NEUTROABS 21.5*  --  14.8*  HGB 10.3* 9.3* 8.5*  HCT 31.3* 30.1* 25.7*  MCV 94.3 97.1 94.1  PLT 282 236 229   Basic Metabolic Panel: Recent Labs  Lab 03/09/23 1659 03/10/23 0519 03/11/23 0534  NA 136 137 135  K 3.7 3.6 3.2*  CL 104 105 104  CO2 21* 20* 22  GLUCOSE 124* 97 91  BUN 27* 25* 25*  CREATININE 1.31* 1.26* 1.15*  CALCIUM 8.9 8.4* 8.2*  MG  --  1.7 1.6*  PHOS  --  3.1  --    GFR: Estimated Creatinine Clearance: 31.3 mL/min (A) (by C-G formula based on SCr of 1.15 mg/dL (H)). Liver Function Tests: Recent Labs  Lab 03/09/23 1659  AST 25  ALT 15  ALKPHOS 91  BILITOT 0.7  PROT 6.4*  ALBUMIN 3.5   No results for input(s): "LIPASE", "AMYLASE" in the last 168 hours. Recent Labs  Lab 03/09/23 1659  AMMONIA 15   Coagulation Profile: Recent Labs  Lab 03/09/23 1659  INR 1.2   Cardiac Enzymes: No results for input(s): "CKTOTAL", "CKMB", "CKMBINDEX", "TROPONINI" in the last 168 hours. BNP (last 3 results) No results for input(s): "PROBNP" in the last 8760 hours. HbA1C: No results for input(s): "HGBA1C" in the last 72 hours. CBG: No results for input(s): "GLUCAP" in the last 168 hours. Lipid Profile: No results for input(s): "CHOL", "HDL", "LDLCALC", "TRIG", "CHOLHDL", "LDLDIRECT" in the last 72 hours. Thyroid Function Tests: Recent Labs    03/09/23 1659  TSH 0.540   Anemia Panel: Recent Labs    03/11/23 0534  VITAMINB12 173*  FOLATE 20.2  FERRITIN 90  TIBC 225*  IRON 15*   Urine analysis:    Component Value Date/Time   COLORURINE STRAW (A) 03/09/2023 1841   APPEARANCEUR HAZY (A) 03/09/2023 1841   LABSPEC 1.005 03/09/2023 1841   LABSPEC 1.010 11/10/2011 1302   PHURINE 6.0 03/09/2023 1841   GLUCOSEU NEGATIVE 03/09/2023 1841   HGBUR MODERATE (A) 03/09/2023 1841   BILIRUBINUR NEGATIVE 03/09/2023 1841   BILIRUBINUR NEGAtive 09/08/2021 1411   BILIRUBINUR Negative  11/10/2011 1302   KETONESUR NEGATIVE 03/09/2023 1841   PROTEINUR NEGATIVE 03/09/2023 1841   UROBILINOGEN 0.2 09/08/2021 1411   UROBILINOGEN 1.0 08/05/2014 1246   NITRITE NEGATIVE 03/09/2023 1841   LEUKOCYTESUR MODERATE (A) 03/09/2023 1841   LEUKOCYTESUR Negative 11/10/2011 1302   Sepsis Labs: @LABRCNTIP (procalcitonin:4,lacticidven:4)  ) Recent Results (from the past 240 hour(s))  Blood Culture (routine x 2)     Status: None (Preliminary result)   Collection Time: 03/09/23  4:59 PM   Specimen: BLOOD RIGHT FOREARM  Result Value Ref Range Status   Specimen Description   Final    BLOOD RIGHT FOREARM Performed at [facility], [address], [place], [place] [address]    Special Requests   Final    BOTTLES DRAWN AEROBIC AND ANAEROBIC Blood Culture results may not be optimal due to an excessive volume of  blood received in culture bottles Performed at [facility], [address] [place], [place] [address]    Culture   Final    NO GROWTH 2 DAYS Performed at [facility], [address], [place], [place] [address]    Report Status PENDING  Incomplete  Resp panel by RT-PCR (RSV, Flu A&B, Covid) Anterior Nasal Swab     Status: None   Collection Time: 03/09/23  5:04 PM   Specimen: Anterior Nasal Swab  Result Value Ref Range Status   SARS Coronavirus 2 by RT PCR NEGATIVE NEGATIVE Final    Comment: (NOTE) SARS-CoV-2 target nucleic acids are NOT DETECTED.  The SARS-CoV-2 RNA is generally detectable in upper respiratory specimens during the acute phase of infection. The lowest concentration of SARS-CoV-2 viral copies this assay can detect is 138 copies/mL. A negative result does not preclude SARS-Cov-2 infection and should not be used as the sole basis for treatment or other patient management decisions. A negative result may occur with  improper specimen collection/handling, submission of specimen other than nasopharyngeal swab, presence of viral  mutation(s) within the areas targeted by this assay, and inadequate number of viral copies(<138 copies/mL). A negative result must be combined with clinical observations, patient history, and epidemiological information. The expected result is Negative.  Fact Sheet for Patients:  BloggerCourse.com  Fact Sheet for Healthcare Providers:  SeriousBroker.it  This test is no t yet approved or cleared by the [place] FDA and  has been authorized for detection and/or diagnosis of SARS-CoV-2 by FDA under an Emergency Use Authorization (EUA). This EUA will remain  in effect (meaning this test can be used) for the duration of the COVID-19 declaration under Section 564(b)(1) of the Act, 21 U.S.C.section 360bbb-3(b)(1), unless the authorization is terminated  or revoked sooner.       Influenza A by PCR NEGATIVE NEGATIVE Final   Influenza B by PCR NEGATIVE NEGATIVE Final    Comment: (NOTE) The Xpert Xpress SARS-CoV-2/FLU/RSV plus assay is intended as an aid in the diagnosis of influenza from Nasopharyngeal swab specimens and should not be used as a sole basis for treatment. Nasal washings and aspirates are unacceptable for Xpert Xpress SARS-CoV-2/FLU/RSV testing.  Fact Sheet for Patients: BloggerCourse.com  Fact Sheet for Healthcare Providers: SeriousBroker.it  This test is not yet approved or cleared by the [place] FDA and has been authorized for detection and/or diagnosis of SARS-CoV-2 by FDA under an Emergency Use Authorization (EUA). This EUA will remain in effect (meaning this test can be used) for the duration of the COVID-19 declaration under Section 564(b)(1) of the Act, 21 U.S.C. section 360bbb-3(b)(1), unless the authorization is terminated or revoked.     Resp Syncytial Virus by PCR NEGATIVE NEGATIVE Final    Comment: (NOTE) Fact Sheet for  Patients: BloggerCourse.com  Fact Sheet for Healthcare Providers: SeriousBroker.it  This test is not yet approved or cleared by the [place] FDA and has been authorized for detection and/or diagnosis of SARS-CoV-2 by FDA under an Emergency Use Authorization (EUA). This EUA will remain in effect (meaning this test can be used) for the duration of the COVID-19 declaration under Section 564(b)(1) of the Act, 21 U.S.C. section 360bbb-3(b)(1), unless the authorization is terminated or revoked.  Performed at [facility], [address] [place], [place] [address]   Blood Culture (routine x 2)     Status: None (Preliminary result)   Collection Time: 03/09/23  5:09 PM   Specimen: BLOOD  RIGHT FOREARM  Result Value Ref Range Status   Specimen Description   Final    BLOOD RIGHT FOREARM Performed at [facility], [address] [place], [place] [address]    Special Requests   Final    BOTTLES DRAWN AEROBIC AND ANAEROBIC Blood Culture results may not be optimal due to an excessive volume of blood received in culture bottles Performed at [facility], [address] [place], [place] [address]    Culture   Final    NO GROWTH 2 DAYS Performed at [facility], [address] [place], [place] [address]    Report Status PENDING  Incomplete  Urine Culture     Status: Abnormal   Collection Time: 03/09/23  6:41 PM   Specimen: Urine, Random  Result Value Ref Range Status   Specimen Description   Final    URINE, RANDOM Performed at [facility], [address], [place], [place] [address]    Special Requests   Final    NONE Reflexed from [id number] Performed at [facility], [address], [place], [place] [address]    Culture MULTIPLE SPECIES PRESENT, SUGGEST RECOLLECTION (A)  Final   Report Status 03/11/2023 FINAL  Final  MRSA Next Gen by PCR, Nasal      Status: None   Collection Time: 03/10/23  1:09 AM   Specimen: Nasal Mucosa; Nasal Swab  Result Value Ref Range Status   MRSA by PCR Next Gen NOT DETECTED NOT DETECTED Final    Comment: (NOTE) The GeneXpert MRSA Assay (FDA approved for NASAL specimens only), is one component of a comprehensive MRSA colonization surveillance program. It is not intended to diagnose MRSA infection nor to guide or monitor treatment for MRSA infections. Test performance is not FDA approved in patients less than 42 years old. Performed at [facility], [address], [place], [place] [address]       Studies: No results found.  Scheduled Meds:  allopurinol  300 mg Oral Daily   amLODipine  5 mg Oral Daily   enoxaparin (LOVENOX) injection  30 mg Subcutaneous Q24H   gabapentin  100 mg Oral BID   mometasone-formoterol  2 puff Inhalation BID   montelukast  10 mg Oral Daily    Continuous Infusions:  cefTRIAXone (ROCEPHIN)  IV 2 g (03/11/23 1314)     LOS: 2 days     Briant Cedar, MD [facility]  If 7PM-7AM, please contact night-coverage [email or web address] 03/11/2023, 2:15 PM

## 2023-03-11 NOTE — Evaluation (Signed)
Occupational Therapy Evaluation Patient Details Name: Melissa Morton MRN: [id number] DOB: 1933-07-09 Today's Date: 03/11/2023   History of Present Illness 89 y.o. female with medical history significant of recurrent UTIs, recent right shin cellulitis/wound, GERD, HTN, HLD, previous diverticulitis, arthritis, and gout who presents to the ED via EMS for AMS due to concern for sepsis secondary to UTI.   Clinical Impression   Pt is currently limited by the below listed deficits which compromise her ADL performance and overall functional independence (see OT problem list). At current, she requires CGA for sit to stand using a RW, for toileting at bathroom level, and for grooming standing at the sink. She is with slight deconditioning and mild generalized weakness. She will benefit from OT services in the acute care setting to maximize her ADL performance and to facilitate her safe return home. Anticipate no post-acute therapy needs.        If plan is discharge home, recommend the following: Assistance with cooking/housework;Assist for transportation;Help with stairs or ramp for entrance    Functional Status Assessment  Patient has had a recent decline in their functional status and demonstrates the ability to make significant improvements in function in a reasonable and predictable amount of time.  Equipment Recommendations  None recommended by OT    Recommendations for Other Services       Precautions / Restrictions Precautions Precautions: None Restrictions Weight Bearing Restrictions: No      Mobility Bed Mobility Overal bed mobility: Needs Assistance Bed Mobility: Supine to Sit, Sit to Supine     Supine to sit: Supervision Sit to supine: Supervision        Transfers Overall transfer level: Needs assistance Equipment used: Rolling walker (2 wheels) Transfers: Sit to/from Stand Sit to Stand: Contact guard assist                  Balance     Sitting  balance-Leahy Scale: Good       Standing balance-Leahy Scale: Fair               ADL either performed or assessed with clinical judgement   ADL Overall ADL's : Needs assistance/impaired;Independent Eating/Feeding: Sitting Eating/Feeding Details (indicate cue type and reason): based on clinical judgement Grooming: Contact guard assist;Standing Grooming Details (indicate cue type and reason): she performed hand washing standing at sink         Upper Body Dressing : Set up;Sitting Upper Body Dressing Details (indicate cue type and reason): simulated seated EOB Lower Body Dressing: Maximal assistance Lower Body Dressing Details (indicate cue type and reason): for sock management; pt reported her daughter assists with socks and shoes at home as needed Toilet Transfer: Contact guard assist;Rolling walker (2 wheels);Regular Toilet;Grab bars;Ambulation   Toileting- Clothing Manipulation and Hygiene: Contact guard assist;Sit to/from stand Toileting - Clothing Manipulation Details (indicate cue type and reason): Toileting performed at bathroom level             Vision Baseline Vision/History: 1 Wears glasses              Pertinent Vitals/Pain Pain Assessment Pain Assessment: 0-10 Pain Score: 5  Pain Location: R hand IV site Pain Intervention(s): Monitored during session, Limited activity within patient's tolerance     Extremity/Trunk Assessment Upper Extremity Assessment Upper Extremity Assessment: Overall WFL for tasks assessed   Lower Extremity Assessment Lower Extremity Assessment: Overall WFL for tasks assessed       Communication Communication Communication: Hearing impairment   Cognition Arousal: Alert  Behavior During Therapy: WFL for tasks assessed/performed Overall Cognitive Status: Within Functional Limits for tasks assessed               Home Living Family/patient expects to be discharged to:: Private residence Living Arrangements: Children  (daughter) Available Help at Discharge: Family Type of Home: House Home Access: Stairs to enter Secretary/administrator of Steps: 2 Entrance Stairs-Rails: Right Home Layout: One level     Bathroom Shower/Tub: Chief Strategy Officer: Handicapped height     Home Equipment: Rollator (4 wheels);Shower seat   Additional Comments: has 3 daughters that can assist as needed      Prior Functioning/Environment Prior Level of Function : Needs assist             Mobility Comments: Use of rollator in home. ADLs Comments: Pt's daughter assisted with socks and shoes occasionally , otherwiswe pt managed ADLs. They shared cooking tasks. Pt doesn't drive.        OT Problem List: Decreased strength;Impaired balance (sitting and/or standing);Increased edema      OT Treatment/Interventions: Self-care/ADL training;Therapeutic exercise;Energy conservation;DME and/or AE instruction;Patient/family education;Balance training    OT Goals(Current goals can be found in the care plan section) Acute Rehab OT Goals OT Goal Formulation: With patient Time For Goal Achievement: 03/25/23 Potential to Achieve Goals: Good ADL Goals Pt Will Perform Lower Body Dressing: with supervision;with adaptive equipment;sitting/lateral leans;sit to/from stand Pt Will Transfer to Toilet: with supervision;ambulating;grab bars Pt Will Perform Toileting - Clothing Manipulation and hygiene: with supervision;sit to/from stand  OT Frequency: Min 1X/week       AM-PAC OT "6 Clicks" Daily Activity     Outcome Measure Help from another person eating meals?: None Help from another person taking care of personal grooming?: A Little Help from another person toileting, which includes using toliet, bedpan, or urinal?: A Little Help from another person bathing (including washing, rinsing, drying)?: A Little Help from another person to put on and taking off regular upper body clothing?: A Little Help from another  person to put on and taking off regular lower body clothing?: A Lot 6 Click Score: 18   End of Session Equipment Utilized During Treatment: Rolling walker (2 wheels) Nurse Communication: Mobility status  Activity Tolerance: Patient tolerated treatment well Patient left: in bed;with call bell/phone within reach;with bed alarm set  OT Visit Diagnosis: Muscle weakness (generalized) (M62.81);Unsteadiness on feet (R26.81)                Time: 1440-1455 OT Time Calculation (min): 15 min Charges:  OT General Charges $OT Visit: 1 Visit OT Evaluation $OT Eval Low Complexity: 1 Low    Dencil Cayson L Shriyans Kuenzi, OTR/L 03/11/2023, 5:40 PM

## 2023-03-11 NOTE — Plan of Care (Signed)
  Problem: Clinical Measurements: Goal: Will remain free from infection Outcome: Progressing   Problem: Activity: Goal: Risk for activity intolerance will decrease Outcome: Progressing   Problem: Skin Integrity: Goal: Risk for impaired skin integrity will decrease Outcome: Progressing   Problem: Education: Goal: Knowledge of General Education information will improve Description: Including pain rating scale, medication(s)/side effects and non-pharmacologic comfort measures Outcome: Adequate for Discharge   Problem: Health Behavior/Discharge Planning: Goal: Ability to manage health-related needs will improve Outcome: Adequate for Discharge   Problem: Clinical Measurements: Goal: Ability to maintain clinical measurements within normal limits will improve Outcome: Adequate for Discharge Goal: Diagnostic test results will improve Outcome: Adequate for Discharge Goal: Respiratory complications will improve Outcome: Adequate for Discharge Goal: Cardiovascular complication will be avoided Outcome: Adequate for Discharge   Problem: Nutrition: Goal: Adequate nutrition will be maintained Outcome: Adequate for Discharge   Problem: Coping: Goal: Level of anxiety will decrease Outcome: Adequate for Discharge   Problem: Elimination: Goal: Will not experience complications related to bowel motility Outcome: Adequate for Discharge Goal: Will not experience complications related to urinary retention Outcome: Adequate for Discharge   Problem: Pain Management: Goal: General experience of comfort will improve Outcome: Adequate for Discharge   Problem: Safety: Goal: Ability to remain free from injury will improve Outcome: Adequate for Discharge

## 2023-03-12 DIAGNOSIS — A419 Sepsis, unspecified organism: Secondary | ICD-10-CM | POA: Diagnosis not present

## 2023-03-12 DIAGNOSIS — R6520 Severe sepsis without septic shock: Secondary | ICD-10-CM | POA: Diagnosis not present

## 2023-03-12 LAB — CBC WITH DIFFERENTIAL/PLATELET
Abs Immature Granulocytes: 0.05 10*3/uL (ref 0.00–0.07)
Basophils Absolute: 0.1 10*3/uL (ref 0.0–0.1)
Basophils Relative: 1 %
Eosinophils Absolute: 0.2 10*3/uL (ref 0.0–0.5)
Eosinophils Relative: 2 %
HCT: 27.4 % — ABNORMAL LOW (ref 36.0–46.0)
Hemoglobin: 8.8 g/dL — ABNORMAL LOW (ref 12.0–15.0)
Immature Granulocytes: 0 %
Lymphocytes Relative: 11 %
Lymphs Abs: 1.5 10*3/uL (ref 0.7–4.0)
MCH: 30.2 pg (ref 26.0–34.0)
MCHC: 32.1 g/dL (ref 30.0–36.0)
MCV: 94.2 fL (ref 80.0–100.0)
Monocytes Absolute: 1.2 10*3/uL — ABNORMAL HIGH (ref 0.1–1.0)
Monocytes Relative: 8 %
Neutro Abs: 11.3 10*3/uL — ABNORMAL HIGH (ref 1.7–7.7)
Neutrophils Relative %: 78 %
Platelets: 254 10*3/uL (ref 150–400)
RBC: 2.91 MIL/uL — ABNORMAL LOW (ref 3.87–5.11)
RDW: 15.5 % (ref 11.5–15.5)
WBC: 14.4 10*3/uL — ABNORMAL HIGH (ref 4.0–10.5)
nRBC: 0 % (ref 0.0–0.2)

## 2023-03-12 LAB — BASIC METABOLIC PANEL
Anion gap: 9 (ref 5–15)
BUN: 18 mg/dL (ref 8–23)
CO2: 20 mmol/L — ABNORMAL LOW (ref 22–32)
Calcium: 8.2 mg/dL — ABNORMAL LOW (ref 8.9–10.3)
Chloride: 105 mmol/L (ref 98–111)
Creatinine, Ser: 1.13 mg/dL — ABNORMAL HIGH (ref 0.44–1.00)
GFR, Estimated: 47 mL/min — ABNORMAL LOW (ref >60)
Glucose, Bld: 84 mg/dL (ref 70–99)
Potassium: 3.5 mmol/L (ref 3.5–5.1)
Sodium: 134 mmol/L — ABNORMAL LOW (ref 135–145)

## 2023-03-12 LAB — MAGNESIUM: Magnesium: 2.2 mg/dL (ref 1.7–2.4)

## 2023-03-12 MED ORDER — CYANOCOBALAMIN 1000 MCG PO TABS: 1000.0000 ug | ORAL_TABLET | Freq: Every day | ORAL | 0 refills | Status: AC

## 2023-03-12 MED ORDER — CEFDINIR 300 MG PO CAPS: 300.0000 mg | ORAL_CAPSULE | Freq: Two times a day (BID) | ORAL | 0 refills | Status: AC

## 2023-03-12 NOTE — Plan of Care (Signed)

## 2023-03-12 NOTE — Progress Notes (Signed)
Mobility Specialist - Progress Note  Pre-mobility: 109 bpm HR,  During mobility: 123 bpm HR,  Post-mobility: 103 bpm HR,    03/12/23 0935  Mobility  Activity Ambulated with assistance in hallway  Level of Assistance Standby assist, set-up cues, supervision of patient - no hands on  Assistive Device Front wheel walker  Distance Ambulated (ft) 120 ft  Range of Motion/Exercises Active  Activity Response Tolerated well  Mobility Referral Yes  $Mobility charge 1 Mobility  Mobility Specialist Start Time (ACUTE ONLY) [id number]  Mobility Specialist Stop Time (ACUTE ONLY) 0935  Mobility Specialist Time Calculation (min) (ACUTE ONLY) 10 min   Pt was found on recliner chair and agreeable to ambulate. Grew fatigued with session. At EOS returned to recliner chair with all needs met. Call bell in reach and chair alarm on.   Billey Chang Mobility Specialist

## 2023-03-12 NOTE — Progress Notes (Signed)
AVS given to patient and explained at the bedside. Medications and follow up appointments have been explained with pt verbalizing understanding.  

## 2023-03-12 NOTE — Discharge Summary (Signed)
Physician Discharge Summary   Patient: Melissa Morton MRN: [id number] DOB: 1933/10/13  Admit date:     03/09/2023  Discharge date: 03/12/23  Discharge Physician: Briant Cedar   PCP: Noberto Retort, MD   Recommendations at discharge:   Follow-up with PCP in 1 week  Discharge Diagnoses: Principal Problem:   Severe sepsis [facility]) Active Problems:   Community acquired pneumonia of right lung   Cellulitis    Hospital Course: Melissa Morton is a 89 y.o. female with medical history significant of recurrent UTIs, recent right shin cellulitis/wound, GERD, HTN, HLD, previous diverticulitis, arthritis, and gout who presents to the ED via EMS for AMS due to concern for sepsis secondary to UTI.  Patient with a history of frequent UTIs with last 1 about a month ago.  She was on suppressive trimethoprim but this was stopped about 3 weeks ago due to acute kidney injury. Reported N/V, fever X 1 day. Patient also reports RLE wound she sustained a few months ago when she ran into a pit bull. In the ED, noted to be febrile to 101.1 tachycardic to 103, negative COVID and flu test, UA with moderate hemoglobinuria, moderate leuks, WBC 21-50 and few bacteria, lactic acid 2.2->1 0.2->0.9. CXR shows mild patchy right mid and lower lung opacities suspicious for pneumonia.  Right leg x-ray showed generalized soft tissue edema but no acute findings or evidence of osteomyelitis. Hospitalist was consulted for admission.     Today, patient denies any new complaints, eager to be discharged.    Assessment and Plan:  Severe sepsis possibly from UTI Vs CAP Vs ??RLE wound infection Severe sepsis criteria with fever, leukocytosis with lactic acidosis Last temp spike was 101 on 11/7, no fever over 48 hours, with resolving leukocytosis LA has trended down 2.2--->0.9 UA with mod hemoglobinuria, moderate leuks, WBC 21-50 and few bacteria CXR shows mild patchy right mid and lower lung opacities suspicious for  pneumonia Right leg x-ray showed generalized soft tissue edema but no acute findings or evidence of osteomyelitis. UC growing multiple species, BC X 2 NGTD S/p IV vancomycin and cefepime---> switch to ceftriaxone--> p.o. cefdinir to complete 7 days WOC recommend RLE wound care Follow-up with PCP in 1 week   Hypokalemia/hypomagnesemia Replaced prn   CKD 3B Cr at baseline  Anemia of chronic disease Iron def anemia Vit B12 def Hgb at baseline Anemia panel with iron 15, sats 7, Vit B12-->173 Supplement with oral iron, Vit B12   HTN Continue amlodipine   Recurrent UTIs She has been taken off trimethoprim few weeks ago, due to worsening renal function Continue antibiotics as above Recommend urology as outpatient due to recurrent UTIs for further workup.  Patient was once offered cystoscopy of which she refused at [facility] urology, discussed with daughter and patient extensively to reconsider   Asthma Dulera and Singulair DuoNebs   Gout Continue allopurinol    Consultants: None Procedures performed: None Disposition: Home Diet recommendation:  Regular diet   DISCHARGE MEDICATION: Allergies as of 03/12/2023       Reactions   Ciprofloxacin Other (See Comments)   Dizziness   Cyclosporine Other (See Comments)   Possible dizziness   Dilaudid [hydromorphone] Nausea And Vomiting   Nsaids Other (See Comments)   11/09/11-  Avoid due to kidney disease (and possibly liver function, too) Can tolerate low dose Aspirin    Prednisone Nausea And Vomiting        Medication List     STOP taking these medications  simvastatin 40 MG tablet Commonly known as: ZOCOR   trimethoprim 100 MG tablet Commonly known as: TRIMPEX       TAKE these medications    alendronate 70 MG tablet Commonly known as: FOSAMAX Take 70 mg by mouth every Wednesday. Take with a full glass of water on an empty stomach.   ALIGN PREBIOTIC-PROBIOTIC PO Take 1 capsule by mouth daily.    allopurinol 300 MG tablet Commonly known as: ZYLOPRIM Take 150 mg by mouth daily.   amLODipine 2.5 MG tablet Commonly known as: NORVASC Take 2.5 mg by mouth in the morning. What changed: Another medication with the same name was removed. Continue taking this medication, and follow the directions you see here.   aspirin 81 MG chewable tablet Chew 81 mg by mouth See admin instructions. Chew 81 mg by mouth on Mondays and Thursdays   CALCIUM + D3 PO Take 2 tablets by mouth daily with breakfast.   cefdinir 300 MG capsule Commonly known as: OMNICEF Take 1 capsule (300 mg total) by mouth 2 (two) times daily for 4 days. Start taking on: March 13, 2023   colchicine 0.6 MG tablet Take 0.6 mg by mouth daily as needed (for gout flares- AS DIRECTED).   cyanocobalamin 1000 MCG tablet Take 1 tablet (1,000 mcg total) by mouth daily. Start taking on: March 13, 2023   DRY EYE RELIEF OP Place 1-2 drops into both eyes 4 (four) times daily as needed (for dryness).   esomeprazole 40 MG capsule Commonly known as: NEXIUM Take 40 mg by mouth daily before breakfast.   famotidine 20 MG tablet Commonly known as: PEPCID Take 20 mg by mouth at bedtime.   ferrous sulfate 325 (65 FE) MG EC tablet Take 325 mg by mouth at bedtime.   fluticasone 50 MCG/ACT nasal spray Commonly known as: FLONASE Place 1-2 sprays into both nostrils at bedtime.   furosemide 20 MG tablet Commonly known as: LASIX Take 20 mg by mouth in the morning.   gabapentin 100 MG capsule Commonly known as: Neurontin Take 1 capsule (100 mg total) by mouth 2 (two) times daily. What changed: when to take this   montelukast 10 MG tablet Commonly known as: SINGULAIR Take 10 mg by mouth at bedtime.   Multivitamin Gummies [employer] 2 tablets by mouth in the morning.   NON FORMULARY Take 2 capsules by mouth See admin instructions. D-Mannose & Cranberry Extract Advanced Formula, Fast-Acting Natural Urinary Tract  Health Support capsules- Take 2 capsules by mouth at bedtime   ProAir HFA 108 (90 Base) MCG/ACT inhaler Generic drug: albuterol Inhale 2 puffs into the lungs every 6 (six) hours as needed for wheezing or shortness of breath.   promethazine 12.5 MG tablet Commonly known as: PHENERGAN Take 12.5 mg by mouth every 6 (six) hours as needed for nausea or vomiting.   rosuvastatin 10 MG tablet Commonly known as: CRESTOR Take 10 mg by mouth at bedtime.   Salonpas 3.05-08-08 % Ptch Generic drug: Camphor-Menthol-Methyl Sal Place 1 patch onto the skin daily as needed (for pain- and remove as directed).   traMADol 50 MG tablet Commonly known as: ULTRAM Take 50 mg by mouth every 6 (six) hours as needed for moderate pain or severe pain (pain).   Wixela Inhub 100-50 MCG/ACT Aepb Generic drug: fluticasone-salmeterol Inhale 2 puffs into the lungs 2 (two) times daily.   ZyrTEC Allergy 10 MG tablet Generic drug: cetirizine Take 10 mg by mouth at bedtime.  Follow-up Information     Noberto Retort, MD. Schedule an appointment as soon as possible for a visit in 1 week(s).   Specialty: Family Medicine Contact information: [phone] [address] [place] [address] [phone]         [facility] Follow up.   Why: Call to re-establish care for recurrent UTI Contact information: [address] [place] [address] [place] [address] [phone]               Discharge Exam: Ceasar Mons Weights   03/09/23 1641 03/10/23 0356  Weight: 72 kg 65.5 kg   General: NAD  Cardiovascular: S1, S2 present Respiratory: CTAB Abdomen: Soft, nontender, nondistended, bowel sounds present Musculoskeletal: No bilateral pedal edema noted, right leg wound dressing C/D/RN Skin: Normal Psychiatry: Normal mood   Condition at discharge: stable  The results of significant diagnostics from this hospitalization (including imaging, microbiology, ancillary and laboratory)  are listed below for reference.   Imaging Studies: VAS [place] LOWER EXTREMITY VENOUS (DVT) (ONLY [facility] & [facility])  Result Date: 03/10/2023  Lower Venous DVT Study Patient Name:  Melissa Morton  Date of Exam:   03/10/2023 Medical Rec #: [id number]        Accession #:    [id number] Date of Birth: 1933/10/03         Patient Gender: F Patient Age:   29 years Exam Location:  [facility] Procedure:      VAS [place] LOWER EXTREMITY VENOUS (DVT) Referring Phys: PROSPER AMPONSAH --------------------------------------------------------------------------------  Indications: Pain, Edema, and Erythema. Other Indications: Cellulitis. Limitations: Poor ultrasound/tissue interface. Comparison Study: Previous exam on 05/12/2022 was negative for DVT Performing Technologist: Ernestene Mention RVT, RDMS  Examination Guidelines: A complete evaluation includes B-mode imaging, spectral Doppler, color Doppler, and power Doppler as needed of all accessible portions of each vessel. Bilateral testing is considered an integral part of a complete examination. Limited examinations for reoccurring indications may be performed as noted. The reflux portion of the exam is performed with the patient in reverse Trendelenburg.  +---------+---------------+---------+-----------+----------+--------------+ RIGHT    CompressibilityPhasicitySpontaneityPropertiesThrombus Aging +---------+---------------+---------+-----------+----------+--------------+ CFV      Full           Yes      Yes                                 +---------+---------------+---------+-----------+----------+--------------+ SFJ      Full                                                        +---------+---------------+---------+-----------+----------+--------------+ FV Prox  Full           Yes      Yes                                 +---------+---------------+---------+-----------+----------+--------------+ FV Mid   Full           Yes      Yes                                  +---------+---------------+---------+-----------+----------+--------------+ FV DistalFull  Yes      Yes                                 +---------+---------------+---------+-----------+----------+--------------+ PFV      Full                                                        +---------+---------------+---------+-----------+----------+--------------+ POP      Full           Yes      Yes                                 +---------+---------------+---------+-----------+----------+--------------+ PTV      Full                                                        +---------+---------------+---------+-----------+----------+--------------+ PERO     Full                                                        +---------+---------------+---------+-----------+----------+--------------+   +----+---------------+---------+-----------+----------+--------------+ LEFTCompressibilityPhasicitySpontaneityPropertiesThrombus Aging +----+---------------+---------+-----------+----------+--------------+ CFV Full           Yes      Yes                                 +----+---------------+---------+-----------+----------+--------------+    Summary: RIGHT: - There is no evidence of deep vein thrombosis in the lower extremity.  - No cystic structure found in the popliteal fossa. Subcutaneous edema in area of calf and ankle  LEFT: - No evidence of common femoral vein obstruction.   *See table(s) above for measurements and observations. Electronically signed by Heath Lark on 03/10/2023 at 6:13:32 PM.    Final    DG Chest Port 1 View  Result Date: 03/09/2023 CLINICAL DATA:  Sepsis, possible UTI EXAM: PORTABLE CHEST 1 VIEW COMPARISON:  08/30/2022 FINDINGS: Mild patchy right midlung opacity. Additional mild patchy medial right lower lobe opacity. These findings are suspicious for pneumonia. Left lung is clear.  No pleural effusion or pneumothorax. The heart is normal in  size. IMPRESSION: Mild patchy right mid and lower lung opacities, suspicious for pneumonia. Electronically Signed   By: Charline Bills M.D.   On: 03/09/2023 19:22   DG Tibia/Fibula Right  Result Date: 03/09/2023 CLINICAL DATA:  Questionable sepsis with possible urinary tract infection. Right lower leg wound. EXAM: RIGHT TIBIA AND FIBULA - 2 VIEW COMPARISON:  Radiographs 01/25/2023. FINDINGS: The mineralization and alignment are normal. There is no evidence of acute fracture or dislocation. No evidence of osteomyelitis. Tricompartmental degenerative changes are again noted at the knee with a possible small knee joint effusion. Suspected chronic posttraumatic deformity of the distal tibia posteriorly. Midfoot degenerative changes and calcaneal spurring noted. Generalized soft tissue edema is similar to the previous study. No foreign  body or soft tissue emphysema identified. Prominent vascular calcifications noted. IMPRESSION: No acute osseous findings or evidence of osteomyelitis. Generalized soft tissue edema. Electronically Signed   By: Carey Bullocks M.D.   On: 03/09/2023 19:02    Microbiology: Results for orders placed or performed during the hospital encounter of 03/09/23  Blood Culture (routine x 2)     Status: None (Preliminary result)   Collection Time: 03/09/23  4:59 PM   Specimen: BLOOD RIGHT FOREARM  Result Value Ref Range Status   Specimen Description   Final    BLOOD RIGHT FOREARM Performed at [facility], [address] [place], [place] [address]    Special Requests   Final    BOTTLES DRAWN AEROBIC AND ANAEROBIC Blood Culture results may not be optimal due to an excessive volume of blood received in culture bottles Performed at [facility], [address] [place], [place] [address]    Culture   Final    NO GROWTH 3 DAYS Performed at [facility], [address], [place], [place] [address]    Report Status PENDING  Incomplete  Resp panel by  RT-PCR (RSV, Flu A&B, Covid) Anterior Nasal Swab     Status: None   Collection Time: 03/09/23  5:04 PM   Specimen: Anterior Nasal Swab  Result Value Ref Range Status   SARS Coronavirus 2 by RT PCR NEGATIVE NEGATIVE Final    Comment: (NOTE) SARS-CoV-2 target nucleic acids are NOT DETECTED.  The SARS-CoV-2 RNA is generally detectable in upper respiratory specimens during the acute phase of infection. The lowest concentration of SARS-CoV-2 viral copies this assay can detect is 138 copies/mL. A negative result does not preclude SARS-Cov-2 infection and should not be used as the sole basis for treatment or other patient management decisions. A negative result may occur with  improper specimen collection/handling, submission of specimen other than nasopharyngeal swab, presence of viral mutation(s) within the areas targeted by this assay, and inadequate number of viral copies(<138 copies/mL). A negative result must be combined with clinical observations, patient history, and epidemiological information. The expected result is Negative.  Fact Sheet for Patients:  BloggerCourse.com  Fact Sheet for Healthcare Providers:  SeriousBroker.it  This test is no t yet approved or cleared by the [place] FDA and  has been authorized for detection and/or diagnosis of SARS-CoV-2 by FDA under an Emergency Use Authorization (EUA). This EUA will remain  in effect (meaning this test can be used) for the duration of the COVID-19 declaration under Section 564(b)(1) of the Act, 21 U.S.C.section 360bbb-3(b)(1), unless the authorization is terminated  or revoked sooner.       Influenza A by PCR NEGATIVE NEGATIVE Final   Influenza B by PCR NEGATIVE NEGATIVE Final    Comment: (NOTE) The Xpert Xpress SARS-CoV-2/FLU/RSV plus assay is intended as an aid in the diagnosis of influenza from Nasopharyngeal swab specimens and should not be used as a sole basis  for treatment. Nasal washings and aspirates are unacceptable for Xpert Xpress SARS-CoV-2/FLU/RSV testing.  Fact Sheet for Patients: BloggerCourse.com  Fact Sheet for Healthcare Providers: SeriousBroker.it  This test is not yet approved or cleared by the [place] FDA and has been authorized for detection and/or diagnosis of SARS-CoV-2 by FDA under an Emergency Use Authorization (EUA). This EUA will remain in effect (meaning this test can be used) for the duration of the COVID-19 declaration under Section 564(b)(1) of the Act, 21 U.S.C. section 360bbb-3(b)(1), unless the authorization  is terminated or revoked.     Resp Syncytial Virus by PCR NEGATIVE NEGATIVE Final    Comment: (NOTE) Fact Sheet for Patients: BloggerCourse.com  Fact Sheet for Healthcare Providers: SeriousBroker.it  This test is not yet approved or cleared by the [place] FDA and has been authorized for detection and/or diagnosis of SARS-CoV-2 by FDA under an Emergency Use Authorization (EUA). This EUA will remain in effect (meaning this test can be used) for the duration of the COVID-19 declaration under Section 564(b)(1) of the Act, 21 U.S.C. section 360bbb-3(b)(1), unless the authorization is terminated or revoked.  Performed at [facility], [address], [place], [place] [address]   Blood Culture (routine x 2)     Status: None (Preliminary result)   Collection Time: 03/09/23  5:09 PM   Specimen: BLOOD RIGHT FOREARM  Result Value Ref Range Status   Specimen Description   Final    BLOOD RIGHT FOREARM Performed at [facility], [address], [place], [place] [address]    Special Requests   Final    BOTTLES DRAWN AEROBIC AND ANAEROBIC Blood Culture results may not be optimal due to an excessive volume of blood received in culture bottles Performed at [facility], [address] [place], [place] [address]    Culture   Final    NO GROWTH 3 DAYS Performed at [facility], [address], [place], [place] [address]    Report Status PENDING  Incomplete  Urine Culture     Status: Abnormal   Collection Time: 03/09/23  6:41 PM   Specimen: Urine, Random  Result Value Ref Range Status   Specimen Description   Final    URINE, RANDOM Performed at [facility], [address], [place], [place] [address]    Special Requests   Final    NONE Reflexed from [id number] Performed at [facility] [place], [address], [place], [place] [address]    Culture MULTIPLE SPECIES PRESENT, SUGGEST RECOLLECTION (A)  Final   Report Status 03/11/2023 FINAL  Final  MRSA Next Gen by PCR, Nasal     Status: None   Collection Time: 03/10/23  1:09 AM   Specimen: Nasal Mucosa; Nasal Swab  Result Value Ref Range Status   MRSA by PCR Next Gen NOT DETECTED NOT DETECTED Final    Comment: (NOTE) The GeneXpert MRSA Assay (FDA approved for NASAL specimens only), is one component of a comprehensive MRSA colonization surveillance program. It is not intended to diagnose MRSA infection nor to guide or monitor treatment for MRSA infections. Test performance is not FDA approved in patients less than 38 years old. Performed at [facility], [address] [place], [place] [address]     Labs: CBC: Recent Labs  Lab 03/09/23 1659 03/10/23 0519 03/11/23 0534 03/12/23 0542  WBC 23.6* 25.4* 17.8* 14.4*  NEUTROABS 21.5*  --  14.8* 11.3*  HGB 10.3* 9.3* 8.5* 8.8*  HCT 31.3* 30.1* 25.7* 27.4*  MCV 94.3 97.1 94.1 94.2  PLT 282 236 229 254   Basic Metabolic Panel: Recent Labs  Lab 03/09/23 1659 03/10/23 0519 03/11/23 0534 03/12/23 0542  NA 136 137 135 134*  K 3.7 3.6 3.2* 3.5  CL 104 105 104 105  CO2 21* 20* 22 20*  GLUCOSE 124* 97 91 84  BUN 27* 25* 25* 18  CREATININE 1.31* 1.26* 1.15* 1.13*   CALCIUM 8.9 8.4* 8.2* 8.2*  MG  --  1.7 1.6* 2.2  PHOS  --  3.1  --   --    Liver Function Tests: Recent Labs  Lab 03/09/23 1659  AST 25  ALT 15  ALKPHOS 91  BILITOT 0.7  PROT 6.4*  ALBUMIN 3.5   CBG: No results for input(s): "GLUCAP" in the last 168 hours.  Discharge time spent: greater than 30 minutes.  Signed: Briant Cedar, MD [facility] 03/12/2023

## 2023-03-12 NOTE — TOC Initial Note (Signed)
Transition of Care [facility]) - Initial/Assessment Note    Patient Details  Name: Melissa Morton MRN: [id number] Date of Birth: 1933/12/10  Transition of Care [facility]) CM/SW Contact:    Adrian Prows, RN Phone Number: 03/12/2023, 2:02 PM  Clinical Narrative:                 Sherron Monday w/ pt and dtr [name] in room; pt identified POC dtr [name] [phone]); her dtr [name]; she has glasses, dentures (upper/lower) and bilateral HAs; no TOC needs.  Expected Discharge Plan: Home/Self Care Barriers to Discharge: No Barriers Identified   Patient Goals and CMS Choice Patient states their goals for this hospitalization and ongoing recovery are:: home          Expected Discharge Plan and Services   Discharge Planning Services: CM Consult   Living arrangements for the past 2 months: Single Family Home Expected Discharge Date: 03/12/23               DME Arranged: N/A DME Agency: NA       HH Arranged: NA HH Agency: NA        Prior Living Arrangements/Services Living arrangements for the past 2 months: Single Family Home Lives with:: Adult Children Patient language and need for interpreter reviewed:: Yes Do you feel safe going back to the place where you live?: Yes      Need for Family Participation in Patient Care: Yes (Comment) Care giver support system in place?: Yes (comment) Current home services: DME (cane, walker, wheelchair, BSC, shower chair) Criminal Activity/Legal Involvement Pertinent to Current Situation/Hospitalization: No - Comment as needed  Activities of Daily Living   ADL Screening (condition at time of admission) Independently performs ADLs?: Yes (appropriate for developmental age) Is the patient deaf or have difficulty hearing?: Yes (has hearing aids) Does the patient have difficulty seeing, even when wearing glasses/contacts?: No Does the patient have difficulty concentrating, remembering, or making decisions?:  No  Permission Sought/Granted Permission sought to share information with : Case Manager Permission granted to share information with : Yes, Verbal Permission Granted  Share Information with NAME: Case Manager     Permission granted to share info w Relationship: [name] (dtr) [phone]     Emotional Assessment Appearance:: Appears stated age Attitude/Demeanor/Rapport: Gracious Affect (typically observed): Accepting Orientation: : Oriented to Self, Oriented to Place, Oriented to  Time, Oriented to Situation Alcohol / Substance Use: Not Applicable Psych Involvement: No (comment)  Admission diagnosis:  Sepsis (HCC) [A41.9] Cellulitis, unspecified cellulitis site [L03.90] Sepsis, due to unspecified organism, unspecified whether acute organ dysfunction present [facility]) [A41.9] Patient Active Problem List   Diagnosis Date Noted   Cellulitis 03/10/2023   Community acquired pneumonia of right lung 07/24/2022   AKI (acute kidney injury) (HCC) 07/24/2022   Severe sepsis (HCC) 07/24/2022   UTI (urinary tract infection) Klebsiella 12/25/2013   DJD (degenerative joint disease) of knee 12/24/2013   PONV (postoperative nausea and vomiting)    GERD (gastroesophageal reflux disease)    Hypertension    Left knee DJD    Osteoporosis 03/20/2013   Hx of radiation therapy    Breast cancer of lower-outer quadrant of left female breast (HCC) 12/07/2011   Leucocytosis 11/09/2011   Renal insufficiency 11/09/2011   Gout 11/09/2011   PCP:  Noberto Retort, MD Pharmacy:   [facility] [phone] [address], [place] - [address] [place] [address] Phone:  [phone] Fax: [phone]     Social Determinants of Health (SDOH) Social History: SDOH Screenings   Food Insecurity: No Food Insecurity (03/12/2023)  Housing: Low Risk  (03/12/2023)  Transportation Needs: No Transportation Needs (03/12/2023)  Utilities: Not At Risk (03/12/2023)   Depression (PHQ2-9): Low Risk  (09/08/2021)  Tobacco Use: Medium Risk (03/09/2023)   SDOH Interventions: Food Insecurity Interventions: Intervention Not Indicated, Inpatient TOC Housing Interventions: Intervention Not Indicated, Inpatient TOC Transportation Interventions: Intervention Not Indicated, Inpatient TOC Utilities Interventions: Intervention Not Indicated, Inpatient TOC   Readmission Risk Interventions     No data to display

## 2023-03-14 LAB — CULTURE, BLOOD (ROUTINE X 2)
Culture: NO GROWTH
Culture: NO GROWTH

## 2023-03-30 DIAGNOSIS — Z09 Encounter for follow-up examination after completed treatment for conditions other than malignant neoplasm: Secondary | ICD-10-CM | POA: Diagnosis not present

## 2023-03-30 DIAGNOSIS — E7800 Pure hypercholesterolemia, unspecified: Secondary | ICD-10-CM | POA: Diagnosis not present

## 2023-03-30 DIAGNOSIS — L03115 Cellulitis of right lower limb: Secondary | ICD-10-CM | POA: Diagnosis not present

## 2023-03-30 DIAGNOSIS — I10 Essential (primary) hypertension: Secondary | ICD-10-CM | POA: Diagnosis not present

## 2023-03-30 DIAGNOSIS — J4520 Mild intermittent asthma, uncomplicated: Secondary | ICD-10-CM | POA: Diagnosis not present

## 2023-03-30 DIAGNOSIS — G4700 Insomnia, unspecified: Secondary | ICD-10-CM | POA: Diagnosis not present

## 2023-03-30 DIAGNOSIS — N184 Chronic kidney disease, stage 4 (severe): Secondary | ICD-10-CM | POA: Diagnosis not present

## 2023-03-30 DIAGNOSIS — N390 Urinary tract infection, site not specified: Secondary | ICD-10-CM | POA: Diagnosis not present

## 2023-03-30 DIAGNOSIS — N3281 Overactive bladder: Secondary | ICD-10-CM | POA: Diagnosis not present

## 2023-03-30 DIAGNOSIS — K219 Gastro-esophageal reflux disease without esophagitis: Secondary | ICD-10-CM | POA: Diagnosis not present

## 2023-04-05 DIAGNOSIS — D72829 Elevated white blood cell count, unspecified: Secondary | ICD-10-CM | POA: Diagnosis not present

## 2023-05-01 ENCOUNTER — Emergency Department (HOSPITAL_COMMUNITY)
Admission: EM | Admit: 2023-05-01 | Discharge: 2023-05-01 | Disposition: A | Discharge status: Home / Self Care | Payer: 59 | Source: Home / Self Care | Attending: Emergency Medicine | Admitting: Emergency Medicine

## 2023-05-01 ENCOUNTER — Other Ambulatory Visit: Payer: Self-pay

## 2023-05-01 DIAGNOSIS — Z801 Family history of malignant neoplasm of trachea, bronchus and lung: Secondary | ICD-10-CM | POA: Diagnosis not present

## 2023-05-01 DIAGNOSIS — Z87891 Personal history of nicotine dependence: Secondary | ICD-10-CM | POA: Diagnosis not present

## 2023-05-01 DIAGNOSIS — K439 Ventral hernia without obstruction or gangrene: Secondary | ICD-10-CM | POA: Diagnosis not present

## 2023-05-01 DIAGNOSIS — Z806 Family history of leukemia: Secondary | ICD-10-CM | POA: Diagnosis not present

## 2023-05-01 DIAGNOSIS — E785 Hyperlipidemia, unspecified: Secondary | ICD-10-CM | POA: Diagnosis not present

## 2023-05-01 DIAGNOSIS — Z7982 Long term (current) use of aspirin: Secondary | ICD-10-CM | POA: Insufficient documentation

## 2023-05-01 DIAGNOSIS — R000 Tachycardia, unspecified: Secondary | ICD-10-CM | POA: Diagnosis not present

## 2023-05-01 DIAGNOSIS — N3000 Acute cystitis without hematuria: Secondary | ICD-10-CM | POA: Diagnosis not present

## 2023-05-01 DIAGNOSIS — T148XXA Other injury of unspecified body region, initial encounter: Secondary | ICD-10-CM

## 2023-05-01 DIAGNOSIS — Z803 Family history of malignant neoplasm of breast: Secondary | ICD-10-CM | POA: Diagnosis not present

## 2023-05-01 DIAGNOSIS — R109 Unspecified abdominal pain: Secondary | ICD-10-CM | POA: Diagnosis not present

## 2023-05-01 DIAGNOSIS — R6889 Other general symptoms and signs: Secondary | ICD-10-CM | POA: Diagnosis not present

## 2023-05-01 DIAGNOSIS — Z743 Need for continuous supervision: Secondary | ICD-10-CM | POA: Diagnosis not present

## 2023-05-01 DIAGNOSIS — E876 Hypokalemia: Secondary | ICD-10-CM | POA: Diagnosis not present

## 2023-05-01 DIAGNOSIS — Z170 Estrogen receptor positive status [ER+]: Secondary | ICD-10-CM | POA: Diagnosis not present

## 2023-05-01 DIAGNOSIS — Z853 Personal history of malignant neoplasm of breast: Secondary | ICD-10-CM | POA: Diagnosis not present

## 2023-05-01 DIAGNOSIS — I499 Cardiac arrhythmia, unspecified: Secondary | ICD-10-CM | POA: Diagnosis not present

## 2023-05-01 DIAGNOSIS — R001 Bradycardia, unspecified: Secondary | ICD-10-CM | POA: Diagnosis not present

## 2023-05-01 DIAGNOSIS — K838 Other specified diseases of biliary tract: Secondary | ICD-10-CM | POA: Diagnosis not present

## 2023-05-01 DIAGNOSIS — Z96652 Presence of left artificial knee joint: Secondary | ICD-10-CM | POA: Diagnosis not present

## 2023-05-01 DIAGNOSIS — K219 Gastro-esophageal reflux disease without esophagitis: Secondary | ICD-10-CM | POA: Diagnosis not present

## 2023-05-01 DIAGNOSIS — K5792 Diverticulitis of intestine, part unspecified, without perforation or abscess without bleeding: Secondary | ICD-10-CM | POA: Diagnosis not present

## 2023-05-01 DIAGNOSIS — S81801A Unspecified open wound, right lower leg, initial encounter: Secondary | ICD-10-CM | POA: Insufficient documentation

## 2023-05-01 DIAGNOSIS — K449 Diaphragmatic hernia without obstruction or gangrene: Secondary | ICD-10-CM | POA: Diagnosis not present

## 2023-05-01 DIAGNOSIS — X58XXXA Exposure to other specified factors, initial encounter: Secondary | ICD-10-CM | POA: Insufficient documentation

## 2023-05-01 DIAGNOSIS — R110 Nausea: Secondary | ICD-10-CM | POA: Insufficient documentation

## 2023-05-01 DIAGNOSIS — R197 Diarrhea, unspecified: Secondary | ICD-10-CM

## 2023-05-01 DIAGNOSIS — K5732 Diverticulitis of large intestine without perforation or abscess without bleeding: Secondary | ICD-10-CM | POA: Diagnosis not present

## 2023-05-01 DIAGNOSIS — Z7983 Long term (current) use of bisphosphonates: Secondary | ICD-10-CM | POA: Diagnosis not present

## 2023-05-01 DIAGNOSIS — N390 Urinary tract infection, site not specified: Secondary | ICD-10-CM | POA: Diagnosis not present

## 2023-05-01 DIAGNOSIS — I10 Essential (primary) hypertension: Secondary | ICD-10-CM | POA: Insufficient documentation

## 2023-05-01 DIAGNOSIS — N179 Acute kidney failure, unspecified: Secondary | ICD-10-CM | POA: Diagnosis not present

## 2023-05-01 DIAGNOSIS — Z923 Personal history of irradiation: Secondary | ICD-10-CM | POA: Diagnosis not present

## 2023-05-01 DIAGNOSIS — D649 Anemia, unspecified: Secondary | ICD-10-CM | POA: Diagnosis not present

## 2023-05-01 DIAGNOSIS — Z8249 Family history of ischemic heart disease and other diseases of the circulatory system: Secondary | ICD-10-CM | POA: Diagnosis not present

## 2023-05-01 DIAGNOSIS — M109 Gout, unspecified: Secondary | ICD-10-CM | POA: Diagnosis not present

## 2023-05-01 DIAGNOSIS — Z79899 Other long term (current) drug therapy: Secondary | ICD-10-CM | POA: Insufficient documentation

## 2023-05-01 DIAGNOSIS — E8809 Other disorders of plasma-protein metabolism, not elsewhere classified: Secondary | ICD-10-CM | POA: Diagnosis not present

## 2023-05-01 LAB — CBC WITH DIFFERENTIAL/PLATELET
Abs Immature Granulocytes: 0.05 10*3/uL (ref 0.00–0.07)
Basophils Absolute: 0 10*3/uL (ref 0.0–0.1)
Basophils Relative: 0 %
Eosinophils Absolute: 0.1 10*3/uL (ref 0.0–0.5)
Eosinophils Relative: 1 %
HCT: 37.7 % (ref 36.0–46.0)
Hemoglobin: 12.1 g/dL (ref 12.0–15.0)
Immature Granulocytes: 0 %
Lymphocytes Relative: 8 %
Lymphs Abs: 1.1 10*3/uL (ref 0.7–4.0)
MCH: 29.9 pg (ref 26.0–34.0)
MCHC: 32.1 g/dL (ref 30.0–36.0)
MCV: 93.1 fL (ref 80.0–100.0)
Monocytes Absolute: 0.7 10*3/uL (ref 0.1–1.0)
Monocytes Relative: 5 %
Neutro Abs: 11.7 10*3/uL — ABNORMAL HIGH (ref 1.7–7.7)
Neutrophils Relative %: 86 %
Platelets: 255 10*3/uL (ref 150–400)
RBC: 4.05 MIL/uL (ref 3.87–5.11)
RDW: 15 % (ref 11.5–15.5)
WBC: 13.6 10*3/uL — ABNORMAL HIGH (ref 4.0–10.5)
nRBC: 0 % (ref 0.0–0.2)

## 2023-05-01 LAB — COMPREHENSIVE METABOLIC PANEL
ALT: 9 U/L (ref 0–44)
AST: 15 U/L (ref 15–41)
Albumin: 3.6 g/dL (ref 3.5–5.0)
Alkaline Phosphatase: 76 U/L (ref 38–126)
Anion gap: 10 (ref 5–15)
BUN: 17 mg/dL (ref 8–23)
CO2: 21 mmol/L — ABNORMAL LOW (ref 22–32)
Calcium: 8.8 mg/dL — ABNORMAL LOW (ref 8.9–10.3)
Chloride: 108 mmol/L (ref 98–111)
Creatinine, Ser: 1.03 mg/dL — ABNORMAL HIGH (ref 0.44–1.00)
GFR, Estimated: 53 mL/min — ABNORMAL LOW (ref >60)
Glucose, Bld: 101 mg/dL — ABNORMAL HIGH (ref 70–99)
Potassium: 3.4 mmol/L — ABNORMAL LOW (ref 3.5–5.1)
Sodium: 139 mmol/L (ref 135–145)
Total Bilirubin: 0.7 mg/dL (ref <1.2)
Total Protein: 6.8 g/dL (ref 6.5–8.1)

## 2023-05-01 LAB — LIPASE, BLOOD: Lipase: 31 U/L (ref 11–51)

## 2023-05-01 MED ORDER — DICYCLOMINE HCL 20 MG PO TABS: 20.0000 mg | ORAL_TABLET | Freq: Two times a day (BID) | ORAL | 0 refills | Status: DC

## 2023-05-01 NOTE — ED Provider Notes (Cosign Needed)
[place] [facility]ERGENCY DEPARTMENT AT [facility] Provider Note   CSN: [id number] Arrival date & time: 05/01/23  1035     History  Chief Complaint  Patient presents with   Nausea   Diarrhea    Melissa Morton is a 89 y.o. female with past medical history significant for diverticulitis, hypertension, hyperlipidemia, GERD, breast cancer presents to the ED complaining of diarrhea for the past 3 days.  She states it was improving, but today, she had 4 episodes of diarrhea.  She took Imodium with some relief.  Denies abdominal pain, fever, blood in stool, rectal bleeding.  She reports she did have some nausea one day, but none since.  Patient denies eating any unusual foods and she has not been around anyone sick or with similar symptoms.        Home Medications Prior to Admission medications   Medication Sig Start Date End Date Taking? Authorizing Provider  dicyclomine (BENTYL) 20 MG tablet Take 1 tablet (20 mg total) by mouth 2 (two) times daily. 05/01/23  Yes Bron Snellings R, PA-C  alendronate (FOSAMAX) 70 MG tablet Take 70 mg by mouth every Wednesday. Take with a full glass of water on an empty stomach.    [provider]  allopurinol (ZYLOPRIM) 300 MG tablet Take 150 mg by mouth daily.    [provider]  amLODipine (NORVASC) 2.5 MG tablet Take 2.5 mg by mouth in the morning.    [provider]  aspirin 81 MG chewable tablet Chew 81 mg by mouth See admin instructions. Chew 81 mg by mouth on Mondays and Thursdays    [provider]  Bacillus Coagulans-Inulin (ALIGN PREBIOTIC-PROBIOTIC PO) Take 1 capsule by mouth daily.    [provider]  Calcium Carb-Cholecalciferol (CALCIUM + D3 PO) Take 2 tablets by mouth daily with breakfast.    [provider]  Camphor-Menthol-Methyl Sal (SALONPAS) 3.05-08-08 % PTCH Place 1 patch onto the skin daily as needed (for pain- and remove as directed).    [provider]   Carboxymethylcellulose Sodium (DRY EYE RELIEF OP) Place 1-2 drops into both eyes 4 (four) times daily as needed (for dryness).    [provider]  colchicine 0.6 MG tablet Take 0.6 mg by mouth daily as needed (for gout flares- AS DIRECTED).    [provider]  esomeprazole (NEXIUM) 40 MG capsule Take 40 mg by mouth daily before breakfast. 10/01/14   [provider]  famotidine (PEPCID) 20 MG tablet Take 20 mg by mouth at bedtime.    [provider]  ferrous sulfate 325 (65 FE) MG EC tablet Take 325 mg by mouth at bedtime.    [provider]  fluticasone (FLONASE) 50 MCG/ACT nasal spray Place 1-2 sprays into both nostrils at bedtime.    [provider]  fluticasone-salmeterol (WIXELA INHUB) 100-50 MCG/ACT AEPB Inhale 2 puffs into the lungs 2 (two) times daily.    [provider]  furosemide (LASIX) 20 MG tablet Take 20 mg by mouth in the morning. 02/11/23   [provider]  gabapentin (NEURONTIN) 100 MG capsule Take 1 capsule (100 mg total) by mouth 2 (two) times daily. Patient taking differently: Take 100 mg by mouth in the morning and at bedtime. 07/27/22   Ghimire, Werner Lean, MD  montelukast (SINGULAIR) 10 MG tablet Take 10 mg by mouth at bedtime. 12/23/21   [provider]  Multiple Vitamins-Minerals (MULTIVITAMIN GUMMIES WOMENS) CHEW Chew 2 tablets by mouth in the morning.  [provider]  NON FORMULARY Take 2 capsules by mouth See admin instructions. D-Mannose & Cranberry Extract Advanced Formula, Fast-Acting Natural Urinary Tract Health Support capsules- Take 2 capsules by mouth at bedtime    [provider]  PROAIR HFA 108 (90 BASE) MCG/ACT inhaler Inhale 2 puffs into the lungs every 6 (six) hours as needed for wheezing or shortness of breath. 02/04/15   [provider]  promethazine (PHENERGAN) 12.5 MG tablet Take 12.5 mg by mouth every 6 (six) hours as needed for nausea or vomiting.  06/18/19   [provider]  rosuvastatin (CRESTOR) 10 MG tablet Take 10 mg by mouth at bedtime.    [provider]  traMADol (ULTRAM) 50 MG tablet Take 50 mg by mouth every 6 (six) hours as needed for moderate pain or severe pain (pain).    [provider]  ZYRTEC ALLERGY 10 MG tablet Take 10 mg by mouth at bedtime.    [provider]      Allergies    Ciprofloxacin, Cyclosporine, Dilaudid [hydromorphone], Nsaids, and Prednisone    Review of Systems   Review of Systems  Gastrointestinal:  Positive for diarrhea.    Physical Exam Updated Vital Signs BP (!) 151/67   Pulse (!) 54   Temp 98.3 F (36.8 C)   Resp 16   Ht 5\' 3"  (1.6 m)   Wt 66 kg   SpO2 100%   BMI 25.77 kg/m  Physical Exam Vitals and nursing note reviewed.  Constitutional:      General: She is not in acute distress.    Appearance: Normal appearance. She is not ill-appearing or diaphoretic.  Cardiovascular:     Rate and Rhythm: Normal rate and regular rhythm.  Pulmonary:     Effort: Pulmonary effort is normal.  Abdominal:     General: Abdomen is flat. Bowel sounds are increased.     Palpations: Abdomen is soft.     Tenderness: There is no abdominal tenderness.  Skin:    General: Skin is warm and dry.     Capillary Refill: Capillary refill takes less than 2 seconds.     Findings: Wound present.     Comments: Chronic appearing, ulcerated wound to anterior right lower leg with surrounding erythema and mildly increased warmth.  No active drainage.  Neurological:     Mental Status: She is alert. Mental status is at baseline.  Psychiatric:        Mood and Affect: Mood normal.        Behavior: Behavior normal.     ED Results / Procedures / Treatments   Labs (all labs ordered are listed, but only abnormal results are displayed) Labs Reviewed  COMPREHENSIVE METABOLIC PANEL - Abnormal; Notable for the following components:      Result Value   Potassium 3.4 (*)    CO2 21 (*)     Glucose, Bld 101 (*)    Creatinine, Ser 1.03 (*)    Calcium 8.8 (*)    GFR, Estimated 53 (*)    All other components within normal limits  CBC WITH DIFFERENTIAL/PLATELET - Abnormal; Notable for the following components:   WBC 13.6 (*)    Neutro Abs 11.7 (*)    All other components within normal limits  GASTROINTESTINAL PANEL BY PCR, STOOL (REPLACES STOOL CULTURE)  C DIFFICILE QUICK SCREEN W PCR REFLEX    LIPASE, BLOOD  URINALYSIS, W/ REFLEX TO CULTURE (INFECTION SUSPECTED)    EKG None  Radiology No results found.  Procedures Procedures    Medications Ordered in ED Medications - No data to display  ED Course/ Medical Decision Making/ A&P                                 Medical Decision Making Amount and/or Complexity of Data Reviewed Labs: ordered.   This patient presents to the ED with chief complaint(s) of diarrhea with pertinent past medical history of GERD, diverticulosis.  The complaint involves an extensive differential diagnosis and also carries with it a high risk of complications and morbidity.    The differential diagnosis includes infectious diarrhea, colitis, gastroenteritis, diverticulitis    The initial plan is to obtain labs  Initial Assessment:   On exam, patient is resting comfortably in bed and does not appear to be in acute distress.  Abdomen is soft and non tender to palpation.  Mildly increased bowel sounds.  Abdomen is non distended.  Skin is warm and dry.  Vitals are stable and patient is currently afebrile.  Mucus membranes are moist.  There is also a chronic, ulcerated wound with surrounding erythema to the right lower extremity.    Independent ECG/labs interpretation:  The following labs were independently interpreted:  Metabolic panel without major electrolyte disturbance.  Cr at patient's baseline.  CBC with mild leukocytosis, no anemia.  Lipase unremarkable.  Treatment and Reassessment: Patient had episode of diarrhea while in the  ED.  It was not a significant amount and, unfortunately, was not enough to obtain for testing.  Stool appears dark green in color and very loose.   Disposition:   Will discharge patient home with Bentyl to help with any cramping associated with the diarrhea.  Advised patient to also continue taking Imodium as needed.  Diarrhea is presumed to be infectious, though low suspicion for bacterial infection.  Advised patient to stay well hydrated and incorporate electrolyte drinks.  Provided patient with diet to help with diarrhea.  Recommended PCP follow up if symptoms persist.    Patient has had ongoing wound to RLE and has not been evaluated by wound care.  Provided referral information for wound care follow up.   The patient has been appropriately medically screened and/or stabilized in the ED. I have low suspicion for any other emergent medical condition which would require further screening, evaluation or treatment in the ED or require inpatient management. At time of discharge the patient is hemodynamically stable and in no acute distress. I have discussed work-up results and diagnosis with patient and answered all questions. Patient is agreeable with discharge plan. We discussed strict return precautions for returning to the emergency department and they verbalized understanding.           Final Clinical Impression(s) / ED Diagnoses Final diagnoses:  Diarrhea of presumed infectious origin  Chronic wound    Rx / DC Orders ED Discharge Orders          Ordered    dicyclomine (BENTYL) 20 MG tablet  2 times daily        05/01/23 1543              Lenard Simmer, PA-C 05/01/23 1630

## 2023-05-01 NOTE — ED Triage Notes (Signed)
Patient brought in by EMS from home with c/o N/V/D x3 days. States she took imodium yesterday with some relief but diarrhea returned at 0800 this morning reports having 5 episodes of diarrhea. She denies ABD pain, has open wound to RLL x6 weeks. 114/60 26 99% RA CBG: 94

## 2023-05-01 NOTE — ED Notes (Signed)
Pt cleaned thoroughly and changed

## 2023-05-01 NOTE — Discharge Instructions (Addendum)
Thank you for allowing [place] to be a part of your care today.  Your workup today is overall reassuring.  Your diarrhea is likely infectious, and this may be due to a virus.   Continue taking Imodium (loperamide).  Take 4 mg at first episode of diarrhea, then 2 mg after each loose stool with a maximum of 16 mg/day.  I am also prescribing a medication called Bentyl (dicyclomine) to help with abdominal cramping associated with diarrhea.   Stay well hydrated and drink electrolyte drinks if you have multiple episodes of diarrhea per day.  Follow up with your PCP if you have persistent symptoms.    Return to the ED if you develop sudden worsening of your symptoms, have blood in your stool or rectal bleeding, develop severe abdominal pain, fever, or if you have new concerns.   I have also provided information for a wound care clinic to follow up with regarding your leg wound.

## 2023-05-01 NOTE — ED Notes (Signed)
Pt's visitor came out and asked for water. Writer advised to hold off and pt's visitor said "well glad she's not choking and advised she prefers [employer]".

## 2023-05-04 ENCOUNTER — Inpatient Hospital Stay (HOSPITAL_COMMUNITY)
Admission: EM | Admit: 2023-05-04 | Discharge: 2023-05-09 | DRG: 392 | Disposition: A | Discharge status: Home / Self Care | Payer: 59 | Attending: Internal Medicine | Admitting: Internal Medicine

## 2023-05-04 ENCOUNTER — Other Ambulatory Visit: Payer: Self-pay

## 2023-05-04 ENCOUNTER — Emergency Department (HOSPITAL_COMMUNITY): Payer: 59

## 2023-05-04 ENCOUNTER — Encounter (HOSPITAL_COMMUNITY): Payer: Self-pay

## 2023-05-04 DIAGNOSIS — K219 Gastro-esophageal reflux disease without esophagitis: Secondary | ICD-10-CM | POA: Diagnosis present

## 2023-05-04 DIAGNOSIS — Z743 Need for continuous supervision: Secondary | ICD-10-CM | POA: Diagnosis not present

## 2023-05-04 DIAGNOSIS — M109 Gout, unspecified: Secondary | ICD-10-CM | POA: Diagnosis present

## 2023-05-04 DIAGNOSIS — K449 Diaphragmatic hernia without obstruction or gangrene: Secondary | ICD-10-CM | POA: Diagnosis not present

## 2023-05-04 DIAGNOSIS — K5792 Diverticulitis of intestine, part unspecified, without perforation or abscess without bleeding: Principal | ICD-10-CM

## 2023-05-04 DIAGNOSIS — Z801 Family history of malignant neoplasm of trachea, bronchus and lung: Secondary | ICD-10-CM | POA: Diagnosis not present

## 2023-05-04 DIAGNOSIS — Z853 Personal history of malignant neoplasm of breast: Secondary | ICD-10-CM

## 2023-05-04 DIAGNOSIS — Z881 Allergy status to other antibiotic agents status: Secondary | ICD-10-CM

## 2023-05-04 DIAGNOSIS — E876 Hypokalemia: Secondary | ICD-10-CM

## 2023-05-04 DIAGNOSIS — Z8249 Family history of ischemic heart disease and other diseases of the circulatory system: Secondary | ICD-10-CM

## 2023-05-04 DIAGNOSIS — E8809 Other disorders of plasma-protein metabolism, not elsewhere classified: Secondary | ICD-10-CM | POA: Diagnosis present

## 2023-05-04 DIAGNOSIS — B9620 Unspecified Escherichia coli [E. coli] as the cause of diseases classified elsewhere: Secondary | ICD-10-CM | POA: Diagnosis present

## 2023-05-04 DIAGNOSIS — Z96652 Presence of left artificial knee joint: Secondary | ICD-10-CM | POA: Diagnosis present

## 2023-05-04 DIAGNOSIS — Z87891 Personal history of nicotine dependence: Secondary | ICD-10-CM

## 2023-05-04 DIAGNOSIS — Z90710 Acquired absence of both cervix and uterus: Secondary | ICD-10-CM

## 2023-05-04 DIAGNOSIS — Z170 Estrogen receptor positive status [ER+]: Secondary | ICD-10-CM | POA: Diagnosis not present

## 2023-05-04 DIAGNOSIS — K5732 Diverticulitis of large intestine without perforation or abscess without bleeding: Principal | ICD-10-CM | POA: Diagnosis present

## 2023-05-04 DIAGNOSIS — Z886 Allergy status to analgesic agent status: Secondary | ICD-10-CM

## 2023-05-04 DIAGNOSIS — R109 Unspecified abdominal pain: Secondary | ICD-10-CM | POA: Diagnosis not present

## 2023-05-04 DIAGNOSIS — R197 Diarrhea, unspecified: Secondary | ICD-10-CM | POA: Diagnosis not present

## 2023-05-04 DIAGNOSIS — K838 Other specified diseases of biliary tract: Secondary | ICD-10-CM | POA: Diagnosis not present

## 2023-05-04 DIAGNOSIS — E785 Hyperlipidemia, unspecified: Secondary | ICD-10-CM | POA: Diagnosis present

## 2023-05-04 DIAGNOSIS — D649 Anemia, unspecified: Secondary | ICD-10-CM | POA: Diagnosis not present

## 2023-05-04 DIAGNOSIS — Z7983 Long term (current) use of bisphosphonates: Secondary | ICD-10-CM | POA: Diagnosis not present

## 2023-05-04 DIAGNOSIS — Z803 Family history of malignant neoplasm of breast: Secondary | ICD-10-CM | POA: Diagnosis not present

## 2023-05-04 DIAGNOSIS — M25561 Pain in right knee: Secondary | ICD-10-CM | POA: Diagnosis not present

## 2023-05-04 DIAGNOSIS — N390 Urinary tract infection, site not specified: Secondary | ICD-10-CM | POA: Diagnosis present

## 2023-05-04 DIAGNOSIS — N179 Acute kidney failure, unspecified: Secondary | ICD-10-CM | POA: Diagnosis present

## 2023-05-04 DIAGNOSIS — Z923 Personal history of irradiation: Secondary | ICD-10-CM | POA: Diagnosis not present

## 2023-05-04 DIAGNOSIS — R110 Nausea: Secondary | ICD-10-CM | POA: Diagnosis not present

## 2023-05-04 DIAGNOSIS — B961 Klebsiella pneumoniae [K. pneumoniae] as the cause of diseases classified elsewhere: Secondary | ICD-10-CM | POA: Diagnosis present

## 2023-05-04 DIAGNOSIS — M1711 Unilateral primary osteoarthritis, right knee: Secondary | ICD-10-CM | POA: Diagnosis not present

## 2023-05-04 DIAGNOSIS — Z888 Allergy status to other drugs, medicaments and biological substances status: Secondary | ICD-10-CM

## 2023-05-04 DIAGNOSIS — M25461 Effusion, right knee: Secondary | ICD-10-CM | POA: Diagnosis not present

## 2023-05-04 DIAGNOSIS — Z806 Family history of leukemia: Secondary | ICD-10-CM

## 2023-05-04 DIAGNOSIS — D72829 Elevated white blood cell count, unspecified: Secondary | ICD-10-CM

## 2023-05-04 DIAGNOSIS — R6889 Other general symptoms and signs: Secondary | ICD-10-CM | POA: Diagnosis not present

## 2023-05-04 DIAGNOSIS — I10 Essential (primary) hypertension: Secondary | ICD-10-CM | POA: Diagnosis not present

## 2023-05-04 DIAGNOSIS — N3000 Acute cystitis without hematuria: Secondary | ICD-10-CM | POA: Diagnosis not present

## 2023-05-04 DIAGNOSIS — R1111 Vomiting without nausea: Secondary | ICD-10-CM | POA: Diagnosis not present

## 2023-05-04 DIAGNOSIS — K439 Ventral hernia without obstruction or gangrene: Secondary | ICD-10-CM | POA: Diagnosis not present

## 2023-05-04 DIAGNOSIS — Z79899 Other long term (current) drug therapy: Secondary | ICD-10-CM

## 2023-05-04 DIAGNOSIS — Z1721 Progesterone receptor positive status: Secondary | ICD-10-CM

## 2023-05-04 DIAGNOSIS — Z885 Allergy status to narcotic agent status: Secondary | ICD-10-CM

## 2023-05-04 LAB — URINALYSIS, ROUTINE W REFLEX MICROSCOPIC
Bilirubin Urine: NEGATIVE
Glucose, UA: NEGATIVE mg/dL
Hgb urine dipstick: NEGATIVE
Ketones, ur: NEGATIVE mg/dL
Nitrite: POSITIVE — AB
Protein, ur: NEGATIVE mg/dL
Specific Gravity, Urine: 1.013 (ref 1.005–1.030)
WBC, UA: >50 WBC/hpf (ref 0–5)
pH: 5 (ref 5.0–8.0)

## 2023-05-04 LAB — CBC WITH DIFFERENTIAL/PLATELET
Abs Immature Granulocytes: 0.04 10*3/uL (ref 0.00–0.07)
Basophils Absolute: 0 10*3/uL (ref 0.0–0.1)
Basophils Relative: 0 %
Eosinophils Absolute: 0.1 10*3/uL (ref 0.0–0.5)
Eosinophils Relative: 1 %
HCT: 37.5 % (ref 36.0–46.0)
Hemoglobin: 11.8 g/dL — ABNORMAL LOW (ref 12.0–15.0)
Immature Granulocytes: 0 %
Lymphocytes Relative: 13 %
Lymphs Abs: 1.6 10*3/uL (ref 0.7–4.0)
MCH: 29.1 pg (ref 26.0–34.0)
MCHC: 31.5 g/dL (ref 30.0–36.0)
MCV: 92.4 fL (ref 80.0–100.0)
Monocytes Absolute: 1.3 10*3/uL — ABNORMAL HIGH (ref 0.1–1.0)
Monocytes Relative: 11 %
Neutro Abs: 9 10*3/uL — ABNORMAL HIGH (ref 1.7–7.7)
Neutrophils Relative %: 75 %
Platelets: 304 10*3/uL (ref 150–400)
RBC: 4.06 MIL/uL (ref 3.87–5.11)
RDW: 14.6 % (ref 11.5–15.5)
WBC: 12.1 10*3/uL — ABNORMAL HIGH (ref 4.0–10.5)
nRBC: 0 % (ref 0.0–0.2)

## 2023-05-04 LAB — COMPREHENSIVE METABOLIC PANEL
ALT: 8 U/L (ref 0–44)
AST: 15 U/L (ref 15–41)
Albumin: 3.6 g/dL (ref 3.5–5.0)
Alkaline Phosphatase: 82 U/L (ref 38–126)
Anion gap: 11 (ref 5–15)
BUN: 32 mg/dL — ABNORMAL HIGH (ref 8–23)
CO2: 21 mmol/L — ABNORMAL LOW (ref 22–32)
Calcium: 9 mg/dL (ref 8.9–10.3)
Chloride: 104 mmol/L (ref 98–111)
Creatinine, Ser: 1.63 mg/dL — ABNORMAL HIGH (ref 0.44–1.00)
GFR, Estimated: 30 mL/min — ABNORMAL LOW (ref >60)
Glucose, Bld: 92 mg/dL (ref 70–99)
Potassium: 2.9 mmol/L — ABNORMAL LOW (ref 3.5–5.1)
Sodium: 136 mmol/L (ref 135–145)
Total Bilirubin: 0.6 mg/dL (ref 0.0–1.2)
Total Protein: 6.9 g/dL (ref 6.5–8.1)

## 2023-05-04 LAB — CBC
HCT: 33.2 % — ABNORMAL LOW (ref 36.0–46.0)
Hemoglobin: 11 g/dL — ABNORMAL LOW (ref 12.0–15.0)
MCH: 30.4 pg (ref 26.0–34.0)
MCHC: 33.1 g/dL (ref 30.0–36.0)
MCV: 91.7 fL (ref 80.0–100.0)
Platelets: 254 10*3/uL (ref 150–400)
RBC: 3.62 MIL/uL — ABNORMAL LOW (ref 3.87–5.11)
RDW: 14.6 % (ref 11.5–15.5)
WBC: 11.5 10*3/uL — ABNORMAL HIGH (ref 4.0–10.5)
nRBC: 0 % (ref 0.0–0.2)

## 2023-05-04 LAB — CREATININE, SERUM
Creatinine, Ser: 1.19 mg/dL — ABNORMAL HIGH (ref 0.44–1.00)
GFR, Estimated: 44 mL/min — ABNORMAL LOW (ref >60)

## 2023-05-04 LAB — C DIFFICILE QUICK SCREEN W PCR REFLEX
C Diff antigen: NEGATIVE
C Diff interpretation: NOT DETECTED
C Diff toxin: NEGATIVE

## 2023-05-04 LAB — LIPASE, BLOOD: Lipase: 34 U/L (ref 11–51)

## 2023-05-04 MED ORDER — ACETAMINOPHEN 650 MG RE SUPP: 650.0000 mg | Freq: Four times a day (QID) | RECTAL | Status: DC | PRN

## 2023-05-04 MED ORDER — ASPIRIN 81 MG PO CHEW
81.0000 mg | CHEWABLE_TABLET | ORAL | Status: DC
  Administered 2023-05-05 – 2023-05-09 (×3): 81 mg via ORAL
  Filled 2023-05-04 (×3): qty 1

## 2023-05-04 MED ORDER — ROSUVASTATIN CALCIUM 10 MG PO TABS
10.0000 mg | ORAL_TABLET | Freq: Every day | ORAL | Status: DC
  Administered 2023-05-04 – 2023-05-08 (×5): 10 mg via ORAL
  Filled 2023-05-04 (×5): qty 1

## 2023-05-04 MED ORDER — SODIUM CHLORIDE 0.9 % IV SOLN
2.0000 g | INTRAVENOUS | Status: DC
  Administered 2023-05-05 – 2023-05-06 (×2): 2 g via INTRAVENOUS
  Filled 2023-05-04 (×2): qty 20

## 2023-05-04 MED ORDER — MONTELUKAST SODIUM 10 MG PO TABS
10.0000 mg | ORAL_TABLET | Freq: Every day | ORAL | Status: DC
  Administered 2023-05-04 – 2023-05-08 (×5): 10 mg via ORAL
  Filled 2023-05-04 (×5): qty 1

## 2023-05-04 MED ORDER — POTASSIUM CHLORIDE CRYS ER 20 MEQ PO TBCR
40.0000 meq | EXTENDED_RELEASE_TABLET | Freq: Once | ORAL | Status: AC
  Administered 2023-05-04: 40 meq via ORAL
  Filled 2023-05-04: qty 2

## 2023-05-04 MED ORDER — METRONIDAZOLE 500 MG/100ML IV SOLN
500.0000 mg | Freq: Two times a day (BID) | INTRAVENOUS | Status: DC
  Administered 2023-05-04 – 2023-05-07 (×6): 500 mg via INTRAVENOUS
  Filled 2023-05-04 (×6): qty 100

## 2023-05-04 MED ORDER — ONDANSETRON HCL 4 MG PO TABS: 4.0000 mg | ORAL_TABLET | Freq: Four times a day (QID) | ORAL | Status: DC | PRN

## 2023-05-04 MED ORDER — HYDRALAZINE HCL 25 MG PO TABS: 25.0000 mg | ORAL_TABLET | Freq: Four times a day (QID) | ORAL | Status: DC | PRN

## 2023-05-04 MED ORDER — POTASSIUM CHLORIDE 10 MEQ/100ML IV SOLN
10.0000 meq | INTRAVENOUS | Status: AC
  Administered 2023-05-04: 10 meq via INTRAVENOUS
  Filled 2023-05-04 (×3): qty 100

## 2023-05-04 MED ORDER — ONDANSETRON HCL 4 MG/2ML IJ SOLN: 4.0000 mg | Freq: Four times a day (QID) | INTRAMUSCULAR | Status: DC | PRN

## 2023-05-04 MED ORDER — ENOXAPARIN SODIUM 40 MG/0.4ML IJ SOSY
40.0000 mg | PREFILLED_SYRINGE | INTRAMUSCULAR | Status: DC
  Administered 2023-05-05 – 2023-05-08 (×4): 40 mg via SUBCUTANEOUS
  Filled 2023-05-04 (×4): qty 0.4

## 2023-05-04 MED ORDER — ENOXAPARIN SODIUM 30 MG/0.3ML IJ SOSY
30.0000 mg | PREFILLED_SYRINGE | INTRAMUSCULAR | Status: DC
Start: 2023-05-04 — End: 2023-05-04
  Administered 2023-05-04: 30 mg via SUBCUTANEOUS
  Filled 2023-05-04: qty 0.3

## 2023-05-04 MED ORDER — ALBUTEROL SULFATE HFA 108 (90 BASE) MCG/ACT IN AERS: 2.0000 | INHALATION_SPRAY | Freq: Four times a day (QID) | RESPIRATORY_TRACT | Status: DC | PRN

## 2023-05-04 MED ORDER — LACTATED RINGERS IV BOLUS
500.0000 mL | Freq: Once | INTRAVENOUS | Status: AC
  Administered 2023-05-04: 500 mL via INTRAVENOUS

## 2023-05-04 MED ORDER — GABAPENTIN 100 MG PO CAPS
100.0000 mg | ORAL_CAPSULE | Freq: Two times a day (BID) | ORAL | Status: DC
  Administered 2023-05-04 – 2023-05-09 (×10): 100 mg via ORAL
  Filled 2023-05-04 (×10): qty 1

## 2023-05-04 MED ORDER — FLUTICASONE PROPIONATE 50 MCG/ACT NA SUSP
1.0000 | Freq: Every day | NASAL | Status: DC
Start: 2023-05-04 — End: 2023-05-09
  Administered 2023-05-07: 1 via NASAL
  Administered 2023-05-08: 2 via NASAL
  Filled 2023-05-04: qty 16

## 2023-05-04 MED ORDER — LACTATED RINGERS IV BOLUS
1000.0000 mL | Freq: Once | INTRAVENOUS | Status: AC
Start: 2023-05-04 — End: 2023-05-04
  Administered 2023-05-04: 1000 mL via INTRAVENOUS

## 2023-05-04 MED ORDER — ACETAMINOPHEN 325 MG PO TABS
650.0000 mg | ORAL_TABLET | Freq: Four times a day (QID) | ORAL | Status: DC | PRN
  Administered 2023-05-04 – 2023-05-08 (×2): 650 mg via ORAL
  Filled 2023-05-04 (×2): qty 2

## 2023-05-04 MED ORDER — ALBUTEROL SULFATE (2.5 MG/3ML) 0.083% IN NEBU: 2.5000 mg | INHALATION_SOLUTION | Freq: Four times a day (QID) | RESPIRATORY_TRACT | Status: DC | PRN

## 2023-05-04 MED ORDER — DEXTROSE-SODIUM CHLORIDE 5-0.9 % IV SOLN: INTRAVENOUS | Status: DC

## 2023-05-04 MED ORDER — ALLOPURINOL 300 MG PO TABS
150.0000 mg | ORAL_TABLET | Freq: Every day | ORAL | Status: DC
  Administered 2023-05-05 – 2023-05-09 (×5): 150 mg via ORAL
  Filled 2023-05-04 (×5): qty 1

## 2023-05-04 MED ORDER — SODIUM CHLORIDE 0.9 % IV SOLN
1.0000 g | Freq: Once | INTRAVENOUS | Status: AC
  Administered 2023-05-04: 1 g via INTRAVENOUS
  Filled 2023-05-04: qty 10

## 2023-05-04 MED ORDER — COLCHICINE 0.6 MG PO TABS: 0.6000 mg | ORAL_TABLET | Freq: Every day | ORAL | Status: DC | PRN

## 2023-05-04 MED ORDER — SODIUM CHLORIDE 0.9 % IV SOLN
1.0000 g | INTRAVENOUS | Status: AC
  Administered 2023-05-04: 1 g via INTRAVENOUS
  Filled 2023-05-04: qty 10

## 2023-05-04 MED ORDER — IOHEXOL 300 MG/ML  SOLN
80.0000 mL | Freq: Once | INTRAMUSCULAR | Status: AC | PRN
  Administered 2023-05-04: 80 mL via INTRAVENOUS

## 2023-05-04 MED ORDER — AMLODIPINE BESYLATE 5 MG PO TABS
2.5000 mg | ORAL_TABLET | Freq: Every morning | ORAL | Status: DC
  Filled 2023-05-04: qty 1

## 2023-05-04 MED ORDER — ONDANSETRON HCL 4 MG/2ML IJ SOLN
4.0000 mg | Freq: Once | INTRAMUSCULAR | Status: AC
  Administered 2023-05-04: 4 mg via INTRAVENOUS
  Filled 2023-05-04: qty 2

## 2023-05-04 MED ORDER — POTASSIUM CHLORIDE 10 MEQ/100ML IV SOLN
10.0000 meq | INTRAVENOUS | Status: AC
  Administered 2023-05-04 – 2023-05-05 (×2): 10 meq via INTRAVENOUS

## 2023-05-04 NOTE — ED Provider Notes (Signed)
 [place] EMERGENCY DEPARTMENT AT [facility] Provider Note   CSN: [id number] Arrival date & time: 05/04/23  9053     History  Chief Complaint  Patient presents with   Abdominal Pain    Melissa Morton is a 89 y.o. female.  HPI 89 year old female presents today complaining of nausea vomiting diarrhea.  She states symptoms have been present since Sunday.  He was seen here on December 29 at that time had been complaining of diarrhea for the past 3 days.  Denies any blood in her stool, no definite fever or chills, no sick contacts.  States she has continued to have discomfort nausea and diarrhea since discharge    Home Medications Prior to Admission medications   Medication Sig Start Date End Date Taking? Authorizing Provider  alendronate (FOSAMAX) 70 MG tablet Take 70 mg by mouth every Wednesday. Take with a full glass of water on an empty stomach.    [provider]  allopurinol  (ZYLOPRIM ) 300 MG tablet Take 150 mg by mouth daily.    [provider]  amLODipine  (NORVASC ) 2.5 MG tablet Take 2.5 mg by mouth in the morning.    [provider]  aspirin  81 MG chewable tablet Chew 81 mg by mouth See admin instructions. Chew 81 mg by mouth on Mondays and Thursdays    [provider]  Bacillus Coagulans-Inulin (ALIGN PREBIOTIC-PROBIOTIC PO) Take 1 capsule by mouth daily.    [provider]  Calcium  Carb-Cholecalciferol (CALCIUM  + D3 PO) Take 2 tablets by mouth daily with breakfast.    [provider]  Camphor-Menthol -Methyl Sal (SALONPAS) 3.05-08-08 % PTCH Place 1 patch onto the skin daily as needed (for pain- and remove as directed).    [provider]  Carboxymethylcellulose Sodium (DRY EYE RELIEF OP) Place 1-2 drops into both eyes 4 (four) times daily as needed (for dryness).    [provider]  colchicine  0.6 MG tablet Take 0.6 mg by mouth daily as needed (for gout flares- AS DIRECTED).    [provider]  dicyclomine  (BENTYL ) 20 MG tablet Take 1 tablet (20 mg total) by mouth 2 (two) times daily. 05/01/23   Gretta Sayres R, PA-C  esomeprazole (NEXIUM) 40 MG capsule Take 40 mg by mouth daily before breakfast. 10/01/14   [provider]  famotidine (PEPCID) 20 MG tablet Take 20 mg by mouth at bedtime.    [provider]  ferrous sulfate  325 (65 FE) MG EC tablet Take 325 mg by mouth at bedtime.    [provider]  fluticasone  (FLONASE ) 50 MCG/ACT nasal spray Place 1-2 sprays into both nostrils at bedtime.    [provider]  fluticasone -salmeterol (WIXELA INHUB) 100-50 MCG/ACT AEPB Inhale 2 puffs into the lungs 2 (two) times daily.    [provider]  furosemide (LASIX) 20 MG tablet Take 20 mg by mouth in the morning. 02/11/23   [provider]  gabapentin  (NEURONTIN ) 100 MG capsule Take 1 capsule (100 mg total) by mouth 2 (two) times daily. Patient taking differently: Take 100 mg by mouth in the morning and at bedtime. 07/27/22   Ghimire, Donalda CHRISTELLA, MD  montelukast  (SINGULAIR ) 10 MG tablet Take 10 mg by mouth at bedtime. 12/23/21   [provider]  Multiple Vitamins-Minerals (MULTIVITAMIN GUMMIES WOMENS) CHEW Chew 2 tablets by mouth in the morning.    [provider]  NON FORMULARY Take 2 capsules by mouth See admin instructions. D-Mannose & Cranberry Extract Advanced Formula, Fast-Acting  Natural Urinary Tract Health Support capsules- Take 2 capsules by mouth at bedtime    [provider]  PROAIR  HFA 108 (90 BASE) MCG/ACT inhaler Inhale 2 puffs into the lungs every 6 (six) hours as needed for wheezing or shortness of breath. 02/04/15   [provider]  promethazine  (PHENERGAN ) 12.5 MG tablet Take 12.5 mg by mouth every 6 (six) hours as needed for nausea or vomiting. 06/18/19   [provider]  rosuvastatin  (CRESTOR ) 10 MG tablet Take 10 mg by mouth at bedtime.    [provider]   traMADol  (ULTRAM ) 50 MG tablet Take 50 mg by mouth every 6 (six) hours as needed for moderate pain or severe pain (pain).    [provider]  ZYRTEC ALLERGY 10 MG tablet Take 10 mg by mouth at bedtime.    [provider]      Allergies    Ciprofloxacin , Cyclosporine, Dilaudid  [hydromorphone ], Nsaids, and Prednisone    Review of Systems   Review of Systems  Physical Exam Updated Vital Signs BP (!) 152/84 (BP Location: Right Wrist)   Pulse 72   Temp 98.1 F (36.7 C) (Oral)   Resp 16   Ht 1.6 m (5' 3)   Wt 65.8 kg   SpO2 100%   BMI 25.69 kg/m  Physical Exam Vitals reviewed.  HENT:     Head: Normocephalic.     Mouth/Throat:     Mouth: Mucous membranes are moist.  Eyes:     Extraocular Movements: Extraocular movements intact.  Cardiovascular:     Rate and Rhythm: Normal rate.     Heart sounds: Normal heart sounds.  Pulmonary:     Effort: Pulmonary effort is normal.  Abdominal:     General: Abdomen is flat. Bowel sounds are absent.     Palpations: Abdomen is soft.     Tenderness: There is abdominal tenderness in the left lower quadrant.  Skin:    General: Skin is warm.     Capillary Refill: Capillary refill takes less than 2 seconds.  Neurological:     General: No focal deficit present.     Mental Status: She is alert.  Psychiatric:        Mood and Affect: Mood normal.     ED Results / Procedures / Treatments   Labs (all labs ordered are listed, but only abnormal results are displayed) Labs Reviewed  COMPREHENSIVE METABOLIC PANEL - Abnormal; Notable for the following components:      Result Value   Potassium 2.9 (*)    CO2 21 (*)    BUN 32 (*)    Creatinine, Ser 1.63 (*)    GFR, Estimated 30 (*)    All other components within normal limits  CBC WITH DIFFERENTIAL/PLATELET - Abnormal; Notable for the following components:   WBC 12.1 (*)    Hemoglobin 11.8 (*)    Neutro Abs 9.0 (*)    Monocytes Absolute 1.3 (*)    All other components  within normal limits  URINALYSIS, ROUTINE W REFLEX MICROSCOPIC - Abnormal; Notable for the following components:   APPearance CLOUDY (*)    Nitrite POSITIVE (*)    Leukocytes,Ua MODERATE (*)    Bacteria, UA MANY (*)    All other components within normal limits  C DIFFICILE QUICK SCREEN W PCR REFLEX    LIPASE, BLOOD    EKG None  Radiology CT ABDOMEN PELVIS W CONTRAST Result Date: 05/04/2023 CLINICAL DATA:  Nausea/vomiting/diarrhea since Sunday, abdominal pain EXAM: CT ABDOMEN  AND PELVIS WITH CONTRAST TECHNIQUE: Multidetector CT imaging of the abdomen and pelvis was performed using the standard protocol following bolus administration of intravenous contrast. RADIATION DOSE REDUCTION: This exam was performed according to the departmental dose-optimization program which includes automated exposure control, adjustment of the mA and/or kV according to patient size and/or use of iterative reconstruction technique. CONTRAST:  80mL OMNIPAQUE  IOHEXOL  300 MG/ML  SOLN COMPARISON:  02/03/2022 FINDINGS: Lower chest: No acute pleural or parenchymal lung disease. Moderate hiatal hernia. Hepatobiliary: Prior cholecystectomy. Interval progression of intrahepatic and extrahepatic biliary duct dilation, with common bile duct measuring up to 15 mm, likely reservoir effect after cholecystectomy. No filling defects. Liver is otherwise unremarkable. Pancreas: Unremarkable. No pancreatic ductal dilatation or surrounding inflammatory changes. Spleen: Normal in size without focal abnormality. Adrenals/Urinary Tract: Stable benign nodularity of the left adrenal gland given long-term stability. The right adrenals unremarkable. Bilateral renal cortical thinning. No urinary tract calculi or obstructive uropathy within either kidney. Bladder wall is trabeculated, with subtle increased enhancement throughout the bladder mucosa and bilateral ureters. This could reflect urinary tract infection and cystitis. Please correlate with  urinalysis. Stomach/Bowel: No bowel obstruction or ileus. There is long segment mural thickening of the distal descending and rectosigmoid colon with surrounding fat stranding, consistent with inflammatory or infectious colitis versus diverticulitis. No perforation, fluid collection, or abscess. Normal appendix right lower quadrant. Vascular/Lymphatic: Aortic atherosclerosis. No enlarged abdominal or pelvic lymph nodes. Reproductive: Status post hysterectomy. No adnexal masses. Other: No free fluid or free intraperitoneal gas. Stable small fat containing midline ventral hernia. No bowel herniation. Musculoskeletal: No acute or destructive bony abnormalities. Reconstructed images demonstrate no additional findings. IMPRESSION: 1. Mural thickening and fat stranding involving the distal descending and rectosigmoid colon, consistent with inflammatory or infectious colitis versus diverticulitis. No perforation, fluid collection, or abscess. 2. Subtle mucosal enhancement of the ureters and bladder mucosa, which could reflect urinary tract infection and cystitis. 3. Intrahepatic and extrahepatic biliary duct dilation, more prominent than prior study, which may reflect reservoir effect in a patient status post cholecystectomy. No filling defects or choledocholithiasis. 4. Moderate hiatal hernia. 5. Stable fat containing midline supraumbilical ventral hernia. 6.  Aortic Atherosclerosis (ICD10-I70.0). Electronically Signed   By: Ozell Daring M.D.   On: 05/04/2023 15:46    Procedures Procedures    Medications Ordered in ED Medications  cefTRIAXone  (ROCEPHIN ) 1 g in sodium chloride  0.9 % 100 mL IVPB (1 g Intravenous New Bag/Given 05/04/23 1552)  potassium chloride  SA (KLOR-CON  M) CR tablet 40 mEq (has no administration in time range)  lactated ringers  bolus 1,000 mL (0 mLs Intravenous Stopped 05/04/23 1539)  ondansetron  (ZOFRAN ) injection 4 mg (4 mg Intravenous Given 05/04/23 1405)  lactated ringers  bolus 500 mL (500  mLs Intravenous New Bag/Given 05/04/23 1538)  iohexol  (OMNIPAQUE ) 300 MG/ML solution 80 mL (80 mLs Intravenous Contrast Given 05/04/23 1449)    ED Course/ Medical Decision Making/ A&P                                 Medical Decision Making Amount and/or Complexity of Data Reviewed Labs: ordered. Radiology: ordered.  Risk Prescription drug management.   89 year old female presents today with nausea vomiting diarrhea and tenderness to palpation in left lower quadrant on physical exam. Differential diagnosis includes but is not limited to viral gastroenteritis, diarrheal illnesses including C. difficile, diverticulitis, UTI/Pilo, small bowel obstruction Patient evaluated here with labs significant for hypokalemia, AKI, and  leukocytosis Urinalysis and CT pending         Final Clinical Impression(s) / ED Diagnoses Final diagnoses:  None    Rx / DC Orders ED Discharge Orders     None         Levander Houston, MD 05/09/23 1730

## 2023-05-04 NOTE — Plan of Care (Signed)

## 2023-05-04 NOTE — ED Triage Notes (Signed)
 Pt BIB EMS from home with reports of n/v/d since Sunday that has not gotten better.

## 2023-05-04 NOTE — H&P (Signed)
 [facility] H&P    Patient Demographics:    Melissa Morton, is a 89 y.o. female  MRN: [id number]  DOB - August 02, 1933  Admit Date - 05/04/2023  Referring MD/NP/PA: Donnice Hughes  Outpatient Primary MD for the patient is Arloa Elsie SAUNDERS, MD  Patient coming from: Home  Chief complaint-diarrhea   HPI:    Melissa Morton  is a 89 y.o. female, with history of diverticulitis, hypertension, hyperlipidemia, GERD, breast cancer came to ED with complaints of diarrhea for past 3 days.  Patient was recently seen in the ED on 12/29, at that time she was discharged home as she was feeling better.  As per patient she continued to have nausea and diarrhea since discharge.  She admits to having 3-4 loose bowel movements every day.  Denies fever or chills.  Denies abdominal pain.  Denies dysuria.  In the ED CT abdomen/pelvis showed thickening and stranding of of the distal descending and sigmoid colon.  Patient started on ceftriaxone .  Also found to have abnormal UA. Denies chest pain or shortness of breath Denies abdominal pain or dysuria Denies passing out, no dizziness    Review of systems:    In addition to the HPI above,   All other systems reviewed and are negative.    Past History of the following :    Past Medical History:  Diagnosis Date   Allergy    seasonal allergies   Arthritis    knee and hands   Breast cancer, left breast (HCC) 12/07/2011   Breast cancer, left breast (HCC) 11/24/11   left breast 4 o'clock bx=invasive ductal ca,grade II,ER/PR=+   Diverticulitis    Full dentures    GERD (gastroesophageal reflux disease)    HOH (hard of hearing)    right ear    Hx of radiation therapy 03/14/12- 05/02/12   left breast 5040 gray in 28 fx, site of presentation lower outer quadrant boosted to 6440 gray   Hyperlipemia    Hypertension    Left knee DJD    PONV (postoperative nausea and vomiting)    has used scop  patch-that helped   Reflux    Renal insufficiency 11/09/2011   Shortness of breath    Wears glasses       Past Surgical History:  Procedure Laterality Date   ABDOMINAL HYSTERECTOMY  89   total   BLADDER SUSPENSION     BREAST LUMPECTOMY  01/04/2012   left,invasive ductcl ca, grade II,lloq, lymphovascular and perineural invaion ,DCIS,   CHOLECYSTECTOMY     EYE SURGERY     both cataracts   FRACTURE SURGERY     lt foot   KNEE SURGERY  2000   rt    TONSILLECTOMY     TOTAL KNEE ARTHROPLASTY Left 12/24/2013   Procedure: LEFT TOTAL KNEE ARTHROPLASTY;  Surgeon: Lamar DELENA Millman, MD;  Location: [facility] OR;  Service: Orthopedics;  Laterality: Left;   TUBAL LIGATION        Social History:      Social History   Tobacco Use  Smoking status: Former    Current packs/day: 0.00    Average packs/day: 0.3 packs/day for 20.0 years (5.0 ttl pk-yrs)    Types: Cigarettes    Start date: 11/09/1971    Quit date: 11/09/1991    Years since quitting: 31.5   Smokeless tobacco: Never  Substance Use Topics   Alcohol  use: Yes    Comment: socially       Family History :     Family History  Problem Relation Age of Onset   Breast cancer Sister 15   Leukemia Sister    Heart disease Father    Breast cancer Maternal Aunt 36   Lung cancer Cousin        smoker      Home Medications:   Prior to Admission medications   Medication Sig Start Date End Date Taking? Authorizing Provider  ondansetron  (ZOFRAN ) 4 MG tablet Take 4 mg by mouth every 8 (eight) hours as needed. 05/02/23  Yes [provider]  trimethoprim  (TRIMPEX ) 100 MG tablet Take 100 mg by mouth at bedtime. 04/08/23  Yes [provider]  alendronate (FOSAMAX) 70 MG tablet Take 70 mg by mouth every Wednesday. Take with a full glass of water on an empty stomach.    [provider]  allopurinol  (ZYLOPRIM ) 300 MG tablet Take 150 mg by mouth daily.    [provider]  amLODipine  (NORVASC ) 2.5 MG tablet Take 2.5 mg  by mouth in the morning.    [provider]  aspirin  81 MG chewable tablet Chew 81 mg by mouth See admin instructions. Chew 81 mg by mouth on Mondays and Thursdays    [provider]  Bacillus Coagulans-Inulin (ALIGN PREBIOTIC-PROBIOTIC PO) Take 1 capsule by mouth daily.    [provider]  Calcium  Carb-Cholecalciferol (CALCIUM  + D3 PO) Take 2 tablets by mouth daily with breakfast.    [provider]  Camphor-Menthol -Methyl Sal (SALONPAS) 3.05-08-08 % PTCH Place 1 patch onto the skin daily as needed (for pain- and remove as directed).    [provider]  Carboxymethylcellulose Sodium (DRY EYE RELIEF OP) Place 1-2 drops into both eyes 4 (four) times daily as needed (for dryness).    [provider]  colchicine  0.6 MG tablet Take 0.6 mg by mouth daily as needed (for gout flares- AS DIRECTED).    [provider]  dicyclomine  (BENTYL ) 20 MG tablet Take 1 tablet (20 mg total) by mouth 2 (two) times daily. 05/01/23   Gretta Sayres R, PA-C  esomeprazole (NEXIUM) 40 MG capsule Take 40 mg by mouth daily before breakfast. 10/01/14   [provider]  famotidine (PEPCID) 20 MG tablet Take 20 mg by mouth at bedtime.    [provider]  ferrous sulfate  325 (65 FE) MG EC tablet Take 325 mg by mouth at bedtime.    [provider]  fluticasone  (FLONASE ) 50 MCG/ACT nasal spray Place 1-2 sprays into both nostrils at bedtime.    [provider]  fluticasone -salmeterol (WIXELA INHUB) 100-50 MCG/ACT AEPB Inhale 2 puffs into the lungs 2 (two) times daily.    [provider]  furosemide (LASIX) 20 MG tablet Take 20 mg by mouth in the morning. 02/11/23   [provider]  gabapentin  (NEURONTIN ) 100 MG capsule Take 1 capsule (100 mg total) by mouth 2 (two) times daily. Patient taking differently: Take 100 mg by mouth in the morning and at bedtime. 07/27/22   Ghimire, Donalda HERO, MD  montelukast  (SINGULAIR ) 10 MG  tablet Take 10 mg by mouth at bedtime. 12/23/21   [provider]  Multiple Vitamins-Minerals (MULTIVITAMIN GUMMIES WOMENS) CHEW Chew 2 tablets by mouth in the morning.    [provider]  NON FORMULARY Take 2 capsules by mouth See admin instructions. D-Mannose & Cranberry Extract Advanced Formula, Fast-Acting Natural Urinary Tract Health Support capsules- Take 2 capsules by mouth at bedtime    [provider]  PROAIR  HFA 108 (90 BASE) MCG/ACT inhaler Inhale 2 puffs into the lungs every 6 (six) hours as needed for wheezing or shortness of breath. 02/04/15   [provider]  promethazine  (PHENERGAN ) 12.5 MG tablet Take 12.5 mg by mouth every 6 (six) hours as needed for nausea or vomiting. 06/18/19   [provider]  rosuvastatin  (CRESTOR ) 10 MG tablet Take 10 mg by mouth at bedtime.    [provider]  traMADol  (ULTRAM ) 50 MG tablet Take 50 mg by mouth every 6 (six) hours as needed for moderate pain or severe pain (pain).    [provider]  ZYRTEC ALLERGY 10 MG tablet Take 10 mg by mouth at bedtime.    [provider]     Allergies:     Allergies  Allergen Reactions   Ciprofloxacin  Other (See Comments)    Dizziness    Cyclosporine Other (See Comments)    Possible dizziness   Dilaudid  [Hydromorphone ] Nausea And Vomiting   Nsaids Other (See Comments)    11/09/11-  Avoid due to kidney disease (and possibly liver function, too)  Can tolerate low dose Aspirin     Prednisone Nausea And Vomiting     Physical Exam:   Vitals  Blood pressure (!) 152/84, pulse 72, temperature 98.1 F (36.7 C), temperature source Oral, resp. rate 16, height 5' 3 (1.6 m), weight 65.8 kg, SpO2 100%.  1.  General: Appears in no acute distress  2. Psychiatric: Alert, oriented x 3, intact insight and judgment  3. Neurologic: Cranial nerves II through XII grossly intact  4. HEENMT:  Atraumatic normocephalic, extraocular muscles are  intact  5. Respiratory : Lungs are clear to auscultation bilaterally  6. Cardiovascular : S1-S2, regular, no murmur auscultated  7. Gastrointestinal:  Abdominal soft, tenderness elicited in left lower quadrant, no rigidity or guarding      Data Review:    CBC Recent Labs  Lab 05/01/23 1209 05/04/23 1021  WBC 13.6* 12.1*  HGB 12.1 11.8*  HCT 37.7 37.5  PLT 255 304  MCV 93.1 92.4  MCH 29.9 29.1  MCHC 32.1 31.5  RDW 15.0 14.6  LYMPHSABS 1.1 1.6  MONOABS 0.7 1.3*  EOSABS 0.1 0.1  BASOSABS 0.0 0.0   ------------------------------------------------------------------------------------------------------------------  Results for orders placed or performed during the hospital encounter of 05/04/23 (from the past 48 hours)  Comprehensive metabolic panel     Status: Abnormal   Collection Time: 05/04/23 10:21 AM  Result Value Ref Range   Sodium 136 135 - 145 mmol/L   Potassium 2.9 (L) 3.5 - 5.1 mmol/L   Chloride 104 98 - 111 mmol/L   CO2 21 (L) 22 - 32 mmol/L   Glucose, Bld 92 70 - 99 mg/dL    Comment: Glucose reference range applies only to samples taken after fasting for at least 8 hours.   BUN 32 (H) 8 - 23 mg/dL   Creatinine, Ser 8.36 (H) 0.44 - 1.00 mg/dL   Calcium  9.0 8.9 - 10.3 mg/dL   Total Protein 6.9 6.5 - 8.1 g/dL   Albumin 3.6  3.5 - 5.0 g/dL   AST 15 15 - 41 U/L   ALT 8 0 - 44 U/L   Alkaline Phosphatase 82 38 - 126 U/L   Total Bilirubin 0.6 0.0 - 1.2 mg/dL   GFR, Estimated 30 (L) >60 mL/min    Comment: (NOTE) Calculated using the CKD-EPI Creatinine Equation (2021)    Anion gap 11 5 - 15    Comment: Performed at [facility], [address], [place], [place] [address]  Lipase, blood     Status: None   Collection Time: 05/04/23 10:21 AM  Result Value Ref Range   Lipase 34 11 - 51 U/L    Comment: Performed at [facility], [address], [place], [place] [address]  CBC with Diff     Status: Abnormal   Collection  Time: 05/04/23 10:21 AM  Result Value Ref Range   WBC 12.1 (H) 4.0 - 10.5 K/uL   RBC 4.06 3.87 - 5.11 MIL/uL   Hemoglobin 11.8 (L) 12.0 - 15.0 g/dL   HCT 62.4 63.9 - 53.9 %   MCV 92.4 80.0 - 100.0 fL   MCH 29.1 26.0 - 34.0 pg   MCHC 31.5 30.0 - 36.0 g/dL   RDW 85.3 88.4 - 84.4 %   Platelets 304 150 - 400 K/uL   nRBC 0.0 0.0 - 0.2 %   Neutrophils Relative % 75 %   Neutro Abs 9.0 (H) 1.7 - 7.7 K/uL   Lymphocytes Relative 13 %   Lymphs Abs 1.6 0.7 - 4.0 K/uL   Monocytes Relative 11 %   Monocytes Absolute 1.3 (H) 0.1 - 1.0 K/uL   Eosinophils Relative 1 %   Eosinophils Absolute 0.1 0.0 - 0.5 K/uL   Basophils Relative 0 %   Basophils Absolute 0.0 0.0 - 0.1 K/uL   Immature Granulocytes 0 %   Abs Immature Granulocytes 0.04 0.00 - 0.07 K/uL    Comment: Performed at [facility], [address], [place], [place] [address]  Urinalysis, Routine w reflex microscopic -Urine, Clean Catch     Status: Abnormal   Collection Time: 05/04/23  2:48 PM  Result Value Ref Range   Color, Urine YELLOW YELLOW   APPearance CLOUDY (A) CLEAR   Specific Gravity, Urine 1.013 1.005 - 1.030   pH 5.0 5.0 - 8.0   Glucose, UA NEGATIVE NEGATIVE mg/dL   Hgb urine dipstick NEGATIVE NEGATIVE   Bilirubin Urine NEGATIVE NEGATIVE   Ketones, ur NEGATIVE NEGATIVE mg/dL   Protein, ur NEGATIVE NEGATIVE mg/dL   Nitrite POSITIVE (A) NEGATIVE   Leukocytes,Ua MODERATE (A) NEGATIVE   RBC / HPF 0-5 0 - 5 RBC/hpf   WBC, UA >50 0 - 5 WBC/hpf   Bacteria, UA MANY (A) NONE SEEN   Squamous Epithelial / HPF 11-20 0 - 5 /HPF   WBC Clumps PRESENT    Mucus PRESENT    Budding Yeast PRESENT     Comment: Performed at [facility], [address] [place], [place] [address]    Chemistries  Recent Labs  Lab 05/01/23 1209 05/04/23 1021  NA 139 136  K 3.4* 2.9*  CL 108 104  CO2 21* 21*  GLUCOSE 101* 92  BUN 17 32*  CREATININE 1.03* 1.63*  CALCIUM  8.8* 9.0  AST 15 15  ALT 9 8  ALKPHOS 76  82  BILITOT 0.7 0.6   ------------------------------------------------------------------------------------------------------------------  ------------------------------------------------------------------------------------------------------------------ GFR: Estimated Creatinine Clearance: 22.2 mL/min (A) (by C-G formula based on SCr  of 1.63 mg/dL (H)). Liver Function Tests: Recent Labs  Lab 05/01/23 1209 05/04/23 1021  AST 15 15  ALT 9 8  ALKPHOS 76 82  BILITOT 0.7 0.6  PROT 6.8 6.9  ALBUMIN 3.6 3.6   Recent Labs  Lab 05/01/23 1209 05/04/23 1021  LIPASE 31 34   No results for input(s): AMMONIA in the last 168 hours. Coagulation Profile: No results for input(s): INR, PROTIME in the last 168 hours. Cardiac Enzymes: No results for input(s): CKTOTAL, CKMB, CKMBINDEX, TROPONINI in the last 168 hours. BNP (last 3 results) No results for input(s): PROBNP in the last 8760 hours. HbA1C: No results for input(s): HGBA1C in the last 72 hours. CBG: No results for input(s): GLUCAP in the last 168 hours. Lipid Profile: No results for input(s): CHOL, HDL, LDLCALC, TRIG, CHOLHDL, LDLDIRECT in the last 72 hours. Thyroid  Function Tests: No results for input(s): TSH, T4TOTAL, FREET4, T3FREE, THYROIDAB in the last 72 hours. Anemia Panel: No results for input(s): VITAMINB12, FOLATE, FERRITIN, TIBC, IRON, RETICCTPCT in the last 72 hours.  --------------------------------------------------------------------------------------------------------------- Urine analysis:    Component Value Date/Time   COLORURINE YELLOW 05/04/2023 1448   APPEARANCEUR CLOUDY (A) 05/04/2023 1448   LABSPEC 1.013 05/04/2023 1448   LABSPEC 1.010 11/10/2011 1302   PHURINE 5.0 05/04/2023 1448   GLUCOSEU NEGATIVE 05/04/2023 1448   HGBUR NEGATIVE 05/04/2023 1448   BILIRUBINUR NEGATIVE 05/04/2023 1448   BILIRUBINUR NEGAtive 09/08/2021 1411   BILIRUBINUR Negative  11/10/2011 1302   KETONESUR NEGATIVE 05/04/2023 1448   PROTEINUR NEGATIVE 05/04/2023 1448   UROBILINOGEN 0.2 09/08/2021 1411   UROBILINOGEN 1.0 08/05/2014 1246   NITRITE POSITIVE (A) 05/04/2023 1448   LEUKOCYTESUR MODERATE (A) 05/04/2023 1448   LEUKOCYTESUR Negative 11/10/2011 1302      Imaging Results:    CT ABDOMEN PELVIS W CONTRAST Result Date: 05/04/2023 CLINICAL DATA:  Nausea/vomiting/diarrhea since Sunday, abdominal pain EXAM: CT ABDOMEN AND PELVIS WITH CONTRAST TECHNIQUE: Multidetector CT imaging of the abdomen and pelvis was performed using the standard protocol following bolus administration of intravenous contrast. RADIATION DOSE REDUCTION: This exam was performed according to the departmental dose-optimization program which includes automated exposure control, adjustment of the mA and/or kV according to patient size and/or use of iterative reconstruction technique. CONTRAST:  80mL OMNIPAQUE  IOHEXOL  300 MG/ML  SOLN COMPARISON:  02/03/2022 FINDINGS: Lower chest: No acute pleural or parenchymal lung disease. Moderate hiatal hernia. Hepatobiliary: Prior cholecystectomy. Interval progression of intrahepatic and extrahepatic biliary duct dilation, with common bile duct measuring up to 15 mm, likely reservoir effect after cholecystectomy. No filling defects. Liver is otherwise unremarkable. Pancreas: Unremarkable. No pancreatic ductal dilatation or surrounding inflammatory changes. Spleen: Normal in size without focal abnormality. Adrenals/Urinary Tract: Stable benign nodularity of the left adrenal gland given long-term stability. The right adrenals unremarkable. Bilateral renal cortical thinning. No urinary tract calculi or obstructive uropathy within either kidney. Bladder wall is trabeculated, with subtle increased enhancement throughout the bladder mucosa and bilateral ureters. This could reflect urinary tract infection and cystitis. Please correlate with urinalysis. Stomach/Bowel: No bowel  obstruction or ileus. There is long segment mural thickening of the distal descending and rectosigmoid colon with surrounding fat stranding, consistent with inflammatory or infectious colitis versus diverticulitis. No perforation, fluid collection, or abscess. Normal appendix right lower quadrant. Vascular/Lymphatic: Aortic atherosclerosis. No enlarged abdominal or pelvic lymph nodes. Reproductive: Status post hysterectomy. No adnexal masses. Other: No free fluid or free intraperitoneal gas. Stable small fat containing midline ventral hernia. No bowel herniation. Musculoskeletal: No acute or destructive  bony abnormalities. Reconstructed images demonstrate no additional findings. IMPRESSION: 1. Mural thickening and fat stranding involving the distal descending and rectosigmoid colon, consistent with inflammatory or infectious colitis versus diverticulitis. No perforation, fluid collection, or abscess. 2. Subtle mucosal enhancement of the ureters and bladder mucosa, which could reflect urinary tract infection and cystitis. 3. Intrahepatic and extrahepatic biliary duct dilation, more prominent than prior study, which may reflect reservoir effect in a patient status post cholecystectomy. No filling defects or choledocholithiasis. 4. Moderate hiatal hernia. 5. Stable fat containing midline supraumbilical ventral hernia. 6.  Aortic Atherosclerosis (ICD10-I70.0). Electronically Signed   By: Ozell Daring M.D.   On: 05/04/2023 15:46       Assessment & Plan:    Principal Problem:   Diverticulitis   Acute diverticulitis-CT abdomen showed mural thickening and fat stranding involving the distal descending and rectosigmoid colon consistent with inflammatory or infectious colitis versus diverticulitis.  Patient started on ceftriaxone .  Will continue with ceftriaxone  and add IV Flagyl .  Will keep her n.p.o.  Start D5 normal saline at 100 mL/h for 1 day.  Zofran  as needed for nausea and vomiting. Abnormal UA-patient  found to have normal UA, urine culture obtained.  Started on ceftriaxone  as above. Hypertension-continue amlodipine  2.5 mg daily History of gout-continue allopurinol , colchicine  Hyperlipidemia-continue Crestor  Hypokalemia-potassium was 2.9, will replace potassium and follow BMP in am.    DVT Prophylaxis-   Lovenox    AM Labs Ordered, also please review Full Orders  Family Communication: Admission, patients condition and plan of care including tests being ordered have been discussed with the patient  who indicate understanding and agree with the plan and Code Status.  Code Status: Full code  Admission status: /Inpatient :The appropriate admission status for this patient is INPATIENT. Inpatient status is judged to be reasonable and necessary in order to provide the required intensity of service to ensure the patient's safety. The patient's presenting symptoms, physical exam findings, and initial radiographic and laboratory data in the context of their chronic comorbidities is felt to place them at high risk for further clinical deterioration. Furthermore, it is not anticipated that the patient will be medically stable for discharge from the hospital within 2 midnights of admission. The following factors support the admission status of inpatient.     I certify that at the point of admission it is my clinical judgment that the patient will require inpatient hospital care spanning beyond 2 midnights from the point of admission due to high intensity of service, high risk for further deterioration and high frequency of surveillance required.*  Time spent in minutes : 60 min   Gerre Ranum S Averill Winters M.D

## 2023-05-04 NOTE — Plan of Care (Signed)

## 2023-05-04 NOTE — ED Provider Notes (Signed)
 Admitted to hospitalist   Terald Sleeper, MD 05/04/23 [phone]

## 2023-05-05 DIAGNOSIS — E876 Hypokalemia: Secondary | ICD-10-CM | POA: Diagnosis not present

## 2023-05-05 DIAGNOSIS — K5792 Diverticulitis of intestine, part unspecified, without perforation or abscess without bleeding: Secondary | ICD-10-CM | POA: Diagnosis not present

## 2023-05-05 DIAGNOSIS — N3000 Acute cystitis without hematuria: Secondary | ICD-10-CM | POA: Diagnosis not present

## 2023-05-05 LAB — COMPREHENSIVE METABOLIC PANEL
ALT: 9 U/L (ref 0–44)
AST: 10 U/L — ABNORMAL LOW (ref 15–41)
Albumin: 2.6 g/dL — ABNORMAL LOW (ref 3.5–5.0)
Alkaline Phosphatase: 61 U/L (ref 38–126)
Anion gap: 10 (ref 5–15)
BUN: 26 mg/dL — ABNORMAL HIGH (ref 8–23)
CO2: 19 mmol/L — ABNORMAL LOW (ref 22–32)
Calcium: 7.7 mg/dL — ABNORMAL LOW (ref 8.9–10.3)
Chloride: 106 mmol/L (ref 98–111)
Creatinine, Ser: 1.1 mg/dL — ABNORMAL HIGH (ref 0.44–1.00)
GFR, Estimated: 49 mL/min — ABNORMAL LOW (ref >60)
Glucose, Bld: 92 mg/dL (ref 70–99)
Potassium: 3.4 mmol/L — ABNORMAL LOW (ref 3.5–5.1)
Sodium: 135 mmol/L (ref 135–145)
Total Bilirubin: 0.5 mg/dL (ref 0.0–1.2)
Total Protein: 5.4 g/dL — ABNORMAL LOW (ref 6.5–8.1)

## 2023-05-05 LAB — CBC
HCT: 30.3 % — ABNORMAL LOW (ref 36.0–46.0)
Hemoglobin: 9.9 g/dL — ABNORMAL LOW (ref 12.0–15.0)
MCH: 29.9 pg (ref 26.0–34.0)
MCHC: 32.7 g/dL (ref 30.0–36.0)
MCV: 91.5 fL (ref 80.0–100.0)
Platelets: 237 10*3/uL (ref 150–400)
RBC: 3.31 MIL/uL — ABNORMAL LOW (ref 3.87–5.11)
RDW: 14.9 % (ref 11.5–15.5)
WBC: 9 10*3/uL (ref 4.0–10.5)
nRBC: 0 % (ref 0.0–0.2)

## 2023-05-05 MED ORDER — POTASSIUM CHLORIDE CRYS ER 20 MEQ PO TBCR
20.0000 meq | EXTENDED_RELEASE_TABLET | Freq: Once | ORAL | Status: AC
  Administered 2023-05-05: 20 meq via ORAL
  Filled 2023-05-05: qty 1

## 2023-05-05 MED ORDER — LACTATED RINGERS IV SOLN: INTRAVENOUS | Status: AC

## 2023-05-05 NOTE — TOC CM/SW Note (Signed)
 Transition of Care [facility]) - Inpatient Brief Assessment   Patient Details  Name: Melissa Morton MRN: [id number] Date of Birth: 1933-12-01  Transition of Care [facility]) CM/SW Contact:    Alfonse JONELLE Rex, RN Phone Number: 05/05/2023, 11:46 AM   Clinical Narrative: Met with pt at bedside to introduce role of TOC/NCM and review for dc planning. Pt reports she has an established PCP and pharmacy, no current home care services, home Dme: walker, cane, wheelchair, confirmed transportation available at discharge. TOC Brief Assessment completed. No TOC needs identified.     Transition of Care Asessment: Insurance and Status: Insurance coverage has been reviewed Patient has primary care physician: Yes Home environment has been reviewed: resides in private residence with dtr Prior level of function:: Independent with home DME: cane/ walker/wheelchair as needed Prior/Current Home Services: No current home services Social Drivers of Health Review: SDOH reviewed no interventions necessary Readmission risk has been reviewed: Yes Transition of care needs: no transition of care needs at this time

## 2023-05-05 NOTE — Progress Notes (Signed)
 [facility]  PROGRESS NOTE  Melissa Morton FMW:[id number] DOB: 03-Dec-1933 DOA: 05/04/2023 PCP: Melissa Elsie SAUNDERS, MD   Brief HPI:    89 y.o. female, with history of diverticulitis, hypertension, hyperlipidemia, GERD, breast cancer came to ED with complaints of diarrhea for past 3 days.  Patient was recently seen in the ED on 12/29, at that time she was discharged home as she was feeling better.  As per patient she continued to have nausea and diarrhea since discharge.  She admits to having 3-4 loose bowel movements every day.  Denies fever or chills.  Denies abdominal pain.  Denies dysuria.  In the ED CT abdomen/pelvis showed thickening and stranding of of the distal descending and sigmoid colon. Patient started on ceftriaxone . Also found to have abnormal UA.     Assessment/Plan:   Acute diverticulitis -CT abdomen/pelvis showed mural thickening and fat stranding involving the distal descending and rectosigmoid colon consistent with inflammatory or infectious colitis versus diverticulitis. -Continue ceftriaxone  and Flagyl  -Started on clear liquid diet -Likely can advance diet to full liquid tomorrow morning -Continue IV fluids for 12 more hours  Abnormal UA -Found to have normal UA, asymptomatic -Follow urine culture results  Hypertension -Blood pressure has been soft, will discontinue amlodipine  at this time -Continue hydralazine  as needed  History of gout -Continue allopurinol , colchicine   Hyperlipidemia -Continue Crestor   Hypokalemia -Potassium is 3.4 -Replace potassium and follow BMP in am   Medications     allopurinol   150 mg Oral Daily   amLODipine   2.5 mg Oral q AM   aspirin   81 mg Oral Once per day on Monday Thursday   enoxaparin  (LOVENOX ) injection  40 mg Subcutaneous Q24H   fluticasone   1-2 spray Each Nare QHS   gabapentin   100 mg Oral BID   montelukast   10 mg Oral QHS   potassium chloride   20 mEq Oral Once   rosuvastatin   10 mg Oral QHS     Data  Reviewed:   CBG:  No results for input(s): GLUCAP in the last 168 hours.  SpO2: 94 %    Vitals:   05/04/23 2000 05/04/23 2138 05/05/23 0115 05/05/23 0443  BP:  (!) 146/57 (!) 117/51 (!) 127/58  Pulse:  75 74 73  Resp:  17 16 17   Temp:  98.4 F (36.9 C) 98.5 F (36.9 C) 98.2 F (36.8 C)  TempSrc:  Oral Oral Oral  SpO2:  98% 95% 94%  Weight: 65.8 kg     Height: 5' 3 (1.6 m)         Data Reviewed:  Basic Metabolic Panel: Recent Labs  Lab 05/01/23 1209 05/04/23 1021 05/04/23 1940 05/05/23 0440  NA 139 136  --  135  K 3.4* 2.9*  --  3.4*  CL 108 104  --  106  CO2 21* 21*  --  19*  GLUCOSE 101* 92  --  92  BUN 17 32*  --  26*  CREATININE 1.03* 1.63* 1.19* 1.10*  CALCIUM  8.8* 9.0  --  7.7*    CBC: Recent Labs  Lab 05/01/23 1209 05/04/23 1021 05/04/23 1940 05/05/23 0440  WBC 13.6* 12.1* 11.5* 9.0  NEUTROABS 11.7* 9.0*  --   --   HGB 12.1 11.8* 11.0* 9.9*  HCT 37.7 37.5 33.2* 30.3*  MCV 93.1 92.4 91.7 91.5  PLT 255 304 254 237    LFT Recent Labs  Lab 05/01/23 1209 05/04/23 1021 05/05/23 0440  AST 15 15 10*  ALT 9 8 9  ALKPHOS 76 82 61  BILITOT 0.7 0.6 0.5  PROT 6.8 6.9 5.4*  ALBUMIN 3.6 3.6 2.6*     Antibiotics: Anti-infectives (From admission, onward)    Start     Dose/Rate Route Frequency Ordered Stop   05/05/23 1600  cefTRIAXone  (ROCEPHIN ) 2 g in sodium chloride  0.9 % 100 mL IVPB        2 g 200 mL/hr over 30 Minutes Intravenous Every 24 hours 05/04/23 1737     05/04/23 1900  cefTRIAXone  (ROCEPHIN ) 1 g in sodium chloride  0.9 % 100 mL IVPB        1 g 200 mL/hr over 30 Minutes Intravenous STAT 05/04/23 1833 05/04/23 2045   05/04/23 1830  metroNIDAZOLE  (FLAGYL ) IVPB 500 mg        500 mg 100 mL/hr over 60 Minutes Intravenous Every 12 hours 05/04/23 1737     05/04/23 1530  cefTRIAXone  (ROCEPHIN ) 1 g in sodium chloride  0.9 % 100 mL IVPB        1 g 200 mL/hr over 30 Minutes Intravenous  Once 05/04/23 1525 05/04/23 1707        DVT  prophylaxis: Lovenox   Code Status: Full code  Family Communication: No family at bedside   CONSULTS    Subjective   Diarrhea is improving.  Pain has improved   Objective    Physical Examination:   Appears in no acute distress Abdomen-soft, mild tenderness in left lower quadrant Lungs clear to auscultation bilaterally   Status is: Inpatient:             Melissa Morton   [facility] If 7PM-7AM, please contact night-coverage at [email or web address], Office  [phone]   05/05/2023, 8:51 AM  LOS: 1 day

## 2023-05-05 NOTE — Progress Notes (Signed)
 Patients Diastolic BP is low and overall BP is normal to low. Held Norvasc because of BP Trend. Anthoney Harada NP notified.

## 2023-05-05 NOTE — Plan of Care (Signed)

## 2023-05-05 NOTE — Progress Notes (Addendum)
 Patients Potassium is 3.4. Anthoney Harada NP notified. Abigail added a pill for the am.

## 2023-05-05 NOTE — Progress Notes (Addendum)
 Mobility Specialist - Progress Note   05/05/23 1240  Mobility  Activity Ambulated with assistance to bathroom  Level of Assistance Standby assist, set-up cues, supervision of patient - no hands on  Assistive Device Front wheel walker  Distance Ambulated (ft) 20 ft  Activity Response Tolerated well  Mobility Referral Yes  Mobility visit 1 Mobility  Mobility Specialist Start Time (ACUTE ONLY) 1230  Mobility Specialist Stop Time (ACUTE ONLY) 1240  Mobility Specialist Time Calculation (min) (ACUTE ONLY) 10 min   Pt received in bed requesting assistance to the bathroom. No complaints during session. NT made aware. Pt to bathroom after session with all needs met.    [facility]

## 2023-05-05 NOTE — Progress Notes (Addendum)
 Pt calm resting in bed no acute distress noted  safety measures in place call bell within reach  handoff report completed at bedside with jaritou Rn

## 2023-05-06 DIAGNOSIS — K5792 Diverticulitis of intestine, part unspecified, without perforation or abscess without bleeding: Secondary | ICD-10-CM | POA: Diagnosis not present

## 2023-05-06 DIAGNOSIS — N3000 Acute cystitis without hematuria: Secondary | ICD-10-CM | POA: Diagnosis not present

## 2023-05-06 DIAGNOSIS — E876 Hypokalemia: Secondary | ICD-10-CM | POA: Diagnosis not present

## 2023-05-06 LAB — COMPREHENSIVE METABOLIC PANEL
ALT: 8 U/L (ref 0–44)
AST: 10 U/L — ABNORMAL LOW (ref 15–41)
Albumin: 2.5 g/dL — ABNORMAL LOW (ref 3.5–5.0)
Alkaline Phosphatase: 53 U/L (ref 38–126)
Anion gap: 8 (ref 5–15)
BUN: 22 mg/dL (ref 8–23)
CO2: 19 mmol/L — ABNORMAL LOW (ref 22–32)
Calcium: 7.5 mg/dL — ABNORMAL LOW (ref 8.9–10.3)
Chloride: 112 mmol/L — ABNORMAL HIGH (ref 98–111)
Creatinine, Ser: 1.07 mg/dL — ABNORMAL HIGH (ref 0.44–1.00)
GFR, Estimated: 50 mL/min — ABNORMAL LOW (ref >60)
Glucose, Bld: 82 mg/dL (ref 70–99)
Potassium: 3.7 mmol/L (ref 3.5–5.1)
Sodium: 139 mmol/L (ref 135–145)
Total Bilirubin: 0.3 mg/dL (ref 0.0–1.2)
Total Protein: 5.1 g/dL — ABNORMAL LOW (ref 6.5–8.1)

## 2023-05-06 LAB — URINE CULTURE: Culture: >=100000 — AB

## 2023-05-06 LAB — CBC
HCT: 31.6 % — ABNORMAL LOW (ref 36.0–46.0)
Hemoglobin: 10 g/dL — ABNORMAL LOW (ref 12.0–15.0)
MCH: 29.9 pg (ref 26.0–34.0)
MCHC: 31.6 g/dL (ref 30.0–36.0)
MCV: 94.3 fL (ref 80.0–100.0)
Platelets: 252 10*3/uL (ref 150–400)
RBC: 3.35 MIL/uL — ABNORMAL LOW (ref 3.87–5.11)
RDW: 15 % (ref 11.5–15.5)
WBC: 9.2 10*3/uL (ref 4.0–10.5)
nRBC: 0 % (ref 0.0–0.2)

## 2023-05-06 MED ORDER — MEDIHONEY WOUND/BURN DRESSING EX PSTE
1.0000 | PASTE | Freq: Every day | CUTANEOUS | Status: DC
  Administered 2023-05-06 – 2023-05-09 (×3): 1 via TOPICAL
  Filled 2023-05-06 (×2): qty 44

## 2023-05-06 NOTE — Plan of Care (Signed)

## 2023-05-06 NOTE — Progress Notes (Addendum)
 [facility]  PROGRESS NOTE  ADAM DEMARY FMW:[id number] DOB: Nov 10, 1933 DOA: 05/04/2023 PCP: Arloa Elsie SAUNDERS, MD   Brief HPI:    89 y.o. female, with history of diverticulitis, hypertension, hyperlipidemia, GERD, breast cancer came to ED with complaints of diarrhea for past 3 days.  Patient was recently seen in the ED on 12/29, at that time she was discharged home as she was feeling better.  As per patient she continued to have nausea and diarrhea since discharge.  She admits to having 3-4 loose bowel movements every day.  Denies fever or chills.  Denies abdominal pain.  Denies dysuria.  In the ED CT abdomen/pelvis showed thickening and stranding of of the distal descending and sigmoid colon. Patient started on ceftriaxone . Also found to have abnormal UA.     Assessment/Plan:   Acute diverticulitis -Improving -CT abdomen/pelvis showed mural thickening and fat stranding involving the distal descending and rectosigmoid colon consistent with inflammatory or infectious colitis versus diverticulitis. -Continue ceftriaxone  and Flagyl  -Started on clear liquid diet; advance diet to full liquid diet  UTI -Urine culture grew Klebsiella pneumoniae as well as E. Coli -Sensitive to Rocephin    Hypertension -Blood pressure has been soft, will discontinue amlodipine  at this time -Continue hydralazine  as needed  History of gout -Continue allopurinol , colchicine   Hyperlipidemia -Continue Crestor   Hypokalemia -Replete  Hypocalcemia -Calcium  7.5 -Corrected calcium  for hypoalbuminemia is 8.7   Medications     allopurinol   150 mg Oral Daily   aspirin   81 mg Oral Once per day on Monday Thursday   enoxaparin  (LOVENOX ) injection  40 mg Subcutaneous Q24H   fluticasone   1-2 spray Each Nare QHS   gabapentin   100 mg Oral BID   montelukast   10 mg Oral QHS   rosuvastatin   10 mg Oral QHS     Data Reviewed:   CBG:  No results for input(s): GLUCAP in the last 168 hours.  SpO2: 98  %    Vitals:   05/05/23 1318 05/05/23 1326 05/05/23 1927 05/06/23 0445  BP: (!) 109/55 (!) 126/58 (!) 126/57 (!) 117/59  Pulse: 74 74 69 69  Resp: 16 17 17 17   Temp: 98.7 F (37.1 C)  98.2 F (36.8 C) 97.7 F (36.5 C)  TempSrc: Oral  Oral Oral  SpO2: 97% 98% 99% 98%  Weight:      Height:          Data Reviewed:  Basic Metabolic Panel: Recent Labs  Lab 05/01/23 1209 05/04/23 1021 05/04/23 1940 05/05/23 0440 05/06/23 0437  NA 139 136  --  135 139  K 3.4* 2.9*  --  3.4* 3.7  CL 108 104  --  106 112*  CO2 21* 21*  --  19* 19*  GLUCOSE 101* 92  --  92 82  BUN 17 32*  --  26* 22  CREATININE 1.03* 1.63* 1.19* 1.10* 1.07*  CALCIUM  8.8* 9.0  --  7.7* 7.5*    CBC: Recent Labs  Lab 05/01/23 1209 05/04/23 1021 05/04/23 1940 05/05/23 0440 05/06/23 0437  WBC 13.6* 12.1* 11.5* 9.0 9.2  NEUTROABS 11.7* 9.0*  --   --   --   HGB 12.1 11.8* 11.0* 9.9* 10.0*  HCT 37.7 37.5 33.2* 30.3* 31.6*  MCV 93.1 92.4 91.7 91.5 94.3  PLT 255 304 254 237 252    LFT Recent Labs  Lab 05/01/23 1209 05/04/23 1021 05/05/23 0440 05/06/23 0437  AST 15 15 10* 10*  ALT 9 8 9 8   ALKPHOS  76 82 61 53  BILITOT 0.7 0.6 0.5 0.3  PROT 6.8 6.9 5.4* 5.1*  ALBUMIN 3.6 3.6 2.6* 2.5*     Antibiotics: Anti-infectives (From admission, onward)    Start     Dose/Rate Route Frequency Ordered Stop   05/05/23 1600  cefTRIAXone  (ROCEPHIN ) 2 g in sodium chloride  0.9 % 100 mL IVPB        2 g 200 mL/hr over 30 Minutes Intravenous Every 24 hours 05/04/23 1737     05/04/23 1900  cefTRIAXone  (ROCEPHIN ) 1 g in sodium chloride  0.9 % 100 mL IVPB        1 g 200 mL/hr over 30 Minutes Intravenous STAT 05/04/23 1833 05/04/23 2045   05/04/23 1830  metroNIDAZOLE  (FLAGYL ) IVPB 500 mg        500 mg 100 mL/hr over 60 Minutes Intravenous Every 12 hours 05/04/23 1737     05/04/23 1530  cefTRIAXone  (ROCEPHIN ) 1 g in sodium chloride  0.9 % 100 mL IVPB        1 g 200 mL/hr over 30 Minutes Intravenous  Once 05/04/23  1525 05/04/23 1707        DVT prophylaxis: Lovenox   Code Status: Full code  Family Communication: No family at bedside   CONSULTS    Subjective   Diarrhea is improving.  Wants to try different diet.  Currently on clear liquid diet.   Objective    Physical Examination:  General-appears in no acute distress Heart-S1-S2, regular, no murmur auscultated Lungs-clear to auscultation bilaterally, no wheezing or crackles auscultated Abdomen-soft, mild tenderness in left lower quadrant, no rigidity or guarding Extremities-no edema in the lower extremities Neuro-alert, oriented x3, no focal deficit noted   Status is: Inpatient:             [name]   [facility] If 7PM-7AM, please contact night-coverage at [email or web address], Office  [phone]   05/06/2023, 9:16 AM  LOS: 2 days

## 2023-05-06 NOTE — Consult Note (Signed)
 WOC Nurse Consult Note: Reason for Consult: leg wound  Wound type: full thickness wound LE; irregular borders, yellow base  Pressure Injury POA: NA Measurement: see nursing flow sheet Wound bed:see above  Drainage (amount, consistency, odor) none documented Periwound: hemosiderin staining vs. Chronic inflammation, no image of other LE to compare if bilateral  Dressing procedure/placement/frequency: Cleanse with Carolynn Soila # [phone]), pat dry. Apply Medihoney to wound bed, top with foam. Change daily(reapply Medihoney daily). Ok to lift foam and change foam only every 3 days. Teach patient or family to perform.   Consider referral to dermatology, patient has history of skin cancers noted in chart.    Re consult if needed, will not follow at this time. Thanks  Danne [employer]ina [employer], RN,CWOCN, CNS, CWON-AP [phone])

## 2023-05-07 ENCOUNTER — Inpatient Hospital Stay (HOSPITAL_COMMUNITY): Payer: 59

## 2023-05-07 DIAGNOSIS — I10 Essential (primary) hypertension: Secondary | ICD-10-CM | POA: Diagnosis not present

## 2023-05-07 DIAGNOSIS — N3000 Acute cystitis without hematuria: Secondary | ICD-10-CM | POA: Diagnosis not present

## 2023-05-07 DIAGNOSIS — K5792 Diverticulitis of intestine, part unspecified, without perforation or abscess without bleeding: Secondary | ICD-10-CM | POA: Diagnosis not present

## 2023-05-07 DIAGNOSIS — D649 Anemia, unspecified: Secondary | ICD-10-CM | POA: Diagnosis not present

## 2023-05-07 LAB — CBC
HCT: 31.9 % — ABNORMAL LOW (ref 36.0–46.0)
Hemoglobin: 10.4 g/dL — ABNORMAL LOW (ref 12.0–15.0)
MCH: 29.9 pg (ref 26.0–34.0)
MCHC: 32.6 g/dL (ref 30.0–36.0)
MCV: 91.7 fL (ref 80.0–100.0)
Platelets: 276 10*3/uL (ref 150–400)
RBC: 3.48 MIL/uL — ABNORMAL LOW (ref 3.87–5.11)
RDW: 15 % (ref 11.5–15.5)
WBC: 9.6 10*3/uL (ref 4.0–10.5)
nRBC: 0 % (ref 0.0–0.2)

## 2023-05-07 LAB — BASIC METABOLIC PANEL
Anion gap: 5 (ref 5–15)
BUN: 17 mg/dL (ref 8–23)
CO2: 20 mmol/L — ABNORMAL LOW (ref 22–32)
Calcium: 7.4 mg/dL — ABNORMAL LOW (ref 8.9–10.3)
Chloride: 111 mmol/L (ref 98–111)
Creatinine, Ser: 1.08 mg/dL — ABNORMAL HIGH (ref 0.44–1.00)
GFR, Estimated: 50 mL/min — ABNORMAL LOW (ref >60)
Glucose, Bld: 89 mg/dL (ref 70–99)
Potassium: 3.9 mmol/L (ref 3.5–5.1)
Sodium: 136 mmol/L (ref 135–145)

## 2023-05-07 MED ORDER — AMOXICILLIN-POT CLAVULANATE 875-125 MG PO TABS
1.0000 | ORAL_TABLET | Freq: Two times a day (BID) | ORAL | Status: DC
  Administered 2023-05-07 – 2023-05-09 (×5): 1 via ORAL
  Filled 2023-05-07 (×5): qty 1

## 2023-05-07 NOTE — Progress Notes (Addendum)
 [facility] PROGRESS NOTE   Melissa Morton FMW:[id number] DOB: 12-13-1933 DOA: 05/04/2023  PCP: Arloa Elsie SAUNDERS, MD  Brief History: 89 y.o. female, with history of diverticulitis, hypertension, hyperlipidemia, GERD, breast cancer came to ED with complaints of diarrhea for past 3 days.  Patient was recently seen in the ED on 12/29, at that time she was discharged home as she was feeling better.  As per patient she continued to have nausea and diarrhea since discharge.  She admits to having 3-4 loose bowel movements every day.  Denies fever or chills.  Denies abdominal pain.  Denies dysuria.  In the ED CT abdomen/pelvis showed thickening and stranding of of the distal descending and sigmoid colon. Patient started on ceftriaxone . Also found to have abnormal UA.   Consultants: None  Procedures: None    Subjective/Interval History: Patient mentions that she is feeling better.  Abdominal pain has significantly improved.  No nausea vomiting.  Ready to try solid foods today.    Assessment/Plan:  Acute diverticulitis CT abdomen/pelvis showed mural thickening and fat stranding involving the distal descending and rectosigmoid colon consistent with inflammatory or infectious colitis versus diverticulitis. Patient was started on ceftriaxone  and metronidazole .  Seems to be improving.  Advance diet to soft diet today. Changed to Augmentin    UTI -Urine culture grew Klebsiella pneumoniae as well as E. Coli Sensitivities noted.  Changed to Augmentin .   Essential Hypertension Amlodipine  held due to soft blood pressures.  Stable currently.  Normocytic anemia Stable hemoglobin noted.  No overt bleeding noted.  Can be pursued in the outpatient setting.   History of gout -Continue allopurinol , colchicine    Hyperlipidemia -Continue Crestor    Hypokalemia -Supplemented   Hypocalcemia -Calcium  7.5 -Corrected calcium  for hypoalbuminemia is 8.7  Right lower extremity wound She  mentioned that this was caused by her daughter's pet.  No bites but she apparently ran into something as a result of the pet.  Continue wound care.  This is apparently being followed by her primary care provider.   DVT Prophylaxis: Lovenox  Code Status: Full code Family Communication: Discussed with patient Disposition Plan: Anticipate discharge home when improved.  Patient mentions that she has been ambulating in the room.     Medications: Scheduled:  allopurinol   150 mg Oral Daily   aspirin   81 mg Oral Once per day on Monday Thursday   enoxaparin  (LOVENOX ) injection  40 mg Subcutaneous Q24H   fluticasone   1-2 spray Each Nare QHS   gabapentin   100 mg Oral BID   leptospermum manuka honey  1 Application Topical Daily   montelukast   10 mg Oral QHS   rosuvastatin   10 mg Oral QHS   Continuous:  cefTRIAXone  (ROCEPHIN )  IV 2 g (05/06/23 1758)   metronidazole  500 mg (05/07/23 0543)   PRN:acetaminophen  **OR** acetaminophen , albuterol , colchicine , hydrALAZINE , ondansetron  **OR** ondansetron  (ZOFRAN ) IV, ondansetron  (ZOFRAN ) IV  Antibiotics: Anti-infectives (From admission, onward)    Start     Dose/Rate Route Frequency Ordered Stop   05/05/23 1600  cefTRIAXone  (ROCEPHIN ) 2 g in sodium chloride  0.9 % 100 mL IVPB        2 g 200 mL/hr over 30 Minutes Intravenous Every 24 hours 05/04/23 1737     05/04/23 1900  cefTRIAXone  (ROCEPHIN ) 1 g in sodium chloride  0.9 % 100 mL IVPB        1 g 200 mL/hr over 30 Minutes Intravenous STAT 05/04/23 1833 05/04/23 2045   05/04/23 1830  metroNIDAZOLE  (FLAGYL ) IVPB 500 mg  500 mg 100 mL/hr over 60 Minutes Intravenous Every 12 hours 05/04/23 1737     05/04/23 1530  cefTRIAXone  (ROCEPHIN ) 1 g in sodium chloride  0.9 % 100 mL IVPB        1 g 200 mL/hr over 30 Minutes Intravenous  Once 05/04/23 1525 05/04/23 1707       Objective:  Vital Signs  Vitals:   05/05/23 1927 05/06/23 0445 05/06/23 1900 05/07/23 0532  BP: (!) 126/57 (!) 117/59 (!)  125/56 131/87  Pulse: 69 69 73 65  Resp: 17 17 17 20   Temp: 98.2 F (36.8 C) 97.7 F (36.5 C) 98.4 F (36.9 C) 99.4 F (37.4 C)  TempSrc: Oral Oral Oral Oral  SpO2: 99% 98% 98% 96%  Weight:      Height:        Intake/Output Summary (Last 24 hours) at 05/07/2023 0903 Last data filed at 05/06/2023 2037 Gross per 24 hour  Intake 800 ml  Output --  Net 800 ml   Filed Weights   05/04/23 1003 05/04/23 2000  Weight: 65.8 kg 65.8 kg    General appearance: Awake alert.  In no distress Resp: Clear to auscultation bilaterally.  Normal effort Cardio: S1-S2 is normal regular.  No S3-S4.  No rubs murmurs or bruit GI: Abdomen is soft.  Nontender nondistended.  Bowel sounds are present normal.  No masses organomegaly Extremities: No edema.  Full range of motion of lower extremities. Neurologic: Alert and oriented x3.  No focal neurological deficits.    Lab Results:  Data Reviewed: I have personally reviewed following labs and reports of the imaging studies  CBC: Recent Labs  Lab 05/01/23 1209 05/04/23 1021 05/04/23 1940 05/05/23 0440 05/06/23 0437 05/07/23 0527  WBC 13.6* 12.1* 11.5* 9.0 9.2 9.6  NEUTROABS 11.7* 9.0*  --   --   --   --   HGB 12.1 11.8* 11.0* 9.9* 10.0* 10.4*  HCT 37.7 37.5 33.2* 30.3* 31.6* 31.9*  MCV 93.1 92.4 91.7 91.5 94.3 91.7  PLT 255 304 254 237 252 276    Basic Metabolic Panel: Recent Labs  Lab 05/01/23 1209 05/04/23 1021 05/04/23 1940 05/05/23 0440 05/06/23 0437 05/07/23 0527  NA 139 136  --  135 139 136  K 3.4* 2.9*  --  3.4* 3.7 3.9  CL 108 104  --  106 112* 111  CO2 21* 21*  --  19* 19* 20*  GLUCOSE 101* 92  --  92 82 89  BUN 17 32*  --  26* 22 17  CREATININE 1.03* 1.63* 1.19* 1.10* 1.07* 1.08*  CALCIUM  8.8* 9.0  --  7.7* 7.5* 7.4*    GFR: Estimated Creatinine Clearance: 33.5 mL/min (A) (by C-G formula based on SCr of 1.08 mg/dL (H)).  Liver Function Tests: Recent Labs  Lab 05/01/23 1209 05/04/23 1021 05/05/23 0440  05/06/23 0437  AST 15 15 10* 10*  ALT 9 8 9 8   ALKPHOS 76 82 61 53  BILITOT 0.7 0.6 0.5 0.3  PROT 6.8 6.9 5.4* 5.1*  ALBUMIN 3.6 3.6 2.6* 2.5*    Recent Labs  Lab 05/01/23 1209 05/04/23 1021  LIPASE 31 34     Recent Results (from the past 240 hours)  Urine Culture     Status: Abnormal   Collection Time: 05/04/23  2:48 PM   Specimen: Urine, Catheterized  Result Value Ref Range Status   Specimen Description   Final    URINE, CATHETERIZED Performed at [facility], [address]  [place], [place] [address]    Special Requests   Final    NONE Performed at [facility], [address] [place], [place] [address]    Culture (A)  Final    >=100,000 COLONIES/mL ESCHERICHIA COLI 60,000 COLONIES/mL KLEBSIELLA PNEUMONIAE    Report Status 05/06/2023 FINAL  Final   Organism ID, Bacteria ESCHERICHIA COLI (A)  Final   Organism ID, Bacteria KLEBSIELLA PNEUMONIAE (A)  Final      Susceptibility   Escherichia coli - MIC*    AMPICILLIN 8 SENSITIVE Sensitive     CEFAZOLIN  <=4 SENSITIVE Sensitive     CEFEPIME  <=0.12 SENSITIVE Sensitive     CEFTRIAXONE  <=0.25 SENSITIVE Sensitive     CIPROFLOXACIN  1 RESISTANT Resistant     GENTAMICIN <=1 SENSITIVE Sensitive     IMIPENEM <=0.25 SENSITIVE Sensitive     NITROFURANTOIN <=16 SENSITIVE Sensitive     TRIMETH /SULFA  <=20 SENSITIVE Sensitive     AMPICILLIN/SULBACTAM 4 SENSITIVE Sensitive     PIP/TAZO <=4 SENSITIVE Sensitive ug/mL    * >=100,000 COLONIES/mL ESCHERICHIA COLI   Klebsiella pneumoniae - MIC*    AMPICILLIN RESISTANT Resistant     CEFAZOLIN  <=4 SENSITIVE Sensitive     CEFEPIME  <=0.12 SENSITIVE Sensitive     CEFTRIAXONE  <=0.25 SENSITIVE Sensitive     CIPROFLOXACIN  <=0.25 SENSITIVE Sensitive     GENTAMICIN <=1 SENSITIVE Sensitive     IMIPENEM <=0.25 SENSITIVE Sensitive     NITROFURANTOIN 64 INTERMEDIATE Intermediate     TRIMETH /SULFA  >=320 RESISTANT Resistant     AMPICILLIN/SULBACTAM 4  SENSITIVE Sensitive     PIP/TAZO <=4 SENSITIVE Sensitive ug/mL    * 60,000 COLONIES/mL KLEBSIELLA PNEUMONIAE  C Difficile Quick Screen w PCR reflex     Status: None   Collection Time: 05/04/23  3:38 PM   Specimen: STOOL  Result Value Ref Range Status   C Diff antigen NEGATIVE NEGATIVE Final   C Diff toxin NEGATIVE NEGATIVE Final   C Diff interpretation No C. difficile detected.  Final    Comment: Performed at [facility], [address] [place], [place] [address]      Radiology Studies: No results found.     LOS: 3 days   Anaily Ashbaugh [employer] on [email or web address]  05/07/2023, 9:03 AM

## 2023-05-07 NOTE — Plan of Care (Signed)

## 2023-05-07 NOTE — Evaluation (Signed)
 Occupational Therapy Evaluation Patient Details Name: Melissa Morton MRN: [id number] DOB: [birth year] Today's Date: 05/07/2023   History of Present Illness Patient is a 89 year old female who presented on 1/1 with 3 day history of diarrhea.CT abdomen/pelvis showed thickening and stranding of of the distal descending and sigmoid colon. Patient was admitted with acute diverticulitis and UTI. PMH: diverticulitis, hypertension, hyperlipidemia, GERD, breast cancer   Clinical Impression   Patient is a 89 year old female who was admitted for above. Patient lives at home with daughter and daughters large dog. Patient limited today with increased pain in R knee with warmth and edema on medial front of knee. MD made aware. Patient was noted to have decreased functional activity tolerance, decreased endurance, decreased standing balance, decreased safety awareness, and decreased knowledge of AD/AE impacting participation in ADLs. Anticipate that when pain is under control patient wont need further OT. Patient plans to d/c home with daughter at time of d/c.       If plan is discharge home, recommend the following: A little help with walking and/or transfers;A little help with bathing/dressing/bathroom;Assistance with cooking/housework;Direct supervision/assist for medications management;Assist for transportation;Help with stairs or ramp for entrance;Direct supervision/assist for financial management    Functional Status Assessment  Patient has had a recent decline in their functional status and demonstrates the ability to make significant improvements in function in a reasonable and predictable amount of time.  Equipment Recommendations  None recommended by OT       Precautions / Restrictions Restrictions Weight Bearing Restrictions Per Provider Order: No Other Position/Activity Restrictions: gout in R knee per MD      Mobility Bed Mobility Overal bed mobility: Needs Assistance Bed Mobility: Sit to  Supine       Sit to supine: Min assist   General bed mobility comments: to get BLE back into bed            Balance Overall balance assessment: Mild deficits observed, not formally tested             ADL either performed or assessed with clinical judgement   ADL Overall ADL's : Needs assistance/impaired Eating/Feeding: Modified independent   Grooming: Set up;Sitting   Upper Body Bathing: Set up;Sitting   Lower Body Bathing: Sitting/lateral leans;Maximal assistance   Upper Body Dressing : Sitting;Set up   Lower Body Dressing: Sitting/lateral leans;Maximal assistance   Toilet Transfer: Contact guard assist Toilet Transfer Details (indicate cue type and reason): to transfer back to bed with RW with increased time and cues for alighment. Toileting- Clothing Manipulation and Hygiene: Sit to/from stand;Maximal assistance               Vision Baseline Vision/History: 1 Wears glasses              Pertinent Vitals/Pain Pain Assessment Pain Assessment: Faces Faces Pain Scale: Hurts whole lot Pain Location: R knee Pain Descriptors / Indicators: Guarding, Discomfort, Grimacing Pain Intervention(s): Limited activity within patient's tolerance, Monitored during session, Other (comment) (messaged nurse and MD)     Extremity/Trunk Assessment Upper Extremity Assessment Upper Extremity Assessment: Overall WFL for tasks assessed   Lower Extremity Assessment Lower Extremity Assessment: Defer to PT evaluation       Communication     Cognition Arousal: Alert Behavior During Therapy: [facility] for tasks assessed/performed Overall Cognitive Status: Within Functional Limits for tasks assessed  Home Living Family/patient expects to be discharged to:: Private residence Living Arrangements: Children;Other relatives                Prior Functioning/Environment Prior Level of Function : Needs assist                         OT Problem List: Decreased activity tolerance;Impaired balance (sitting and/or standing);Decreased coordination;Decreased knowledge of precautions;Decreased knowledge of use of DME or AE;Decreased safety awareness;Pain      OT Treatment/Interventions: Self-care/ADL training;Therapeutic exercise;DME and/or AE instruction;Patient/family education;Balance training    OT Goals(Current goals can be found in the care plan section) Acute Rehab OT Goals Patient Stated Goal: to get pain under control in RLE OT Goal Formulation: With patient Time For Goal Achievement: 05/21/23 Potential to Achieve Goals: Fair  OT Frequency: Min 1X/week       AM-PAC OT 6 Clicks Daily Activity     Outcome Measure Help from another person eating meals?: None Help from another person taking care of personal grooming?: A Little Help from another person toileting, which includes using toliet, bedpan, or urinal?: A Lot Help from another person bathing (including washing, rinsing, drying)?: A Lot Help from another person to put on and taking off regular upper body clothing?: A Little Help from another person to put on and taking off regular lower body clothing?: A Lot 6 Click Score: 16   End of Session Equipment Utilized During Treatment: Gait belt;Rolling walker (2 wheels) Nurse Communication: Mobility status;Patient requests pain meds  Activity Tolerance: Patient limited by pain Patient left: in bed;with call bell/phone within reach  OT Visit Diagnosis: Unsteadiness on feet (R26.81);Other abnormalities of gait and mobility (R26.89);Pain                Time: 8599-8587 OT Time Calculation (min): 12 min Charges:  OT General Charges $OT Visit: 1 Visit OT Evaluation $OT Eval Low Complexity: 1 Low  Shawnetta Lein OTR/L, MS Acute Rehabilitation Department Office# [phone]   Geofm CHRISTELLA Dance 05/07/2023, 4:36 PM

## 2023-05-08 DIAGNOSIS — D649 Anemia, unspecified: Secondary | ICD-10-CM | POA: Diagnosis not present

## 2023-05-08 DIAGNOSIS — M25561 Pain in right knee: Secondary | ICD-10-CM

## 2023-05-08 DIAGNOSIS — N3000 Acute cystitis without hematuria: Secondary | ICD-10-CM | POA: Diagnosis not present

## 2023-05-08 DIAGNOSIS — I10 Essential (primary) hypertension: Secondary | ICD-10-CM | POA: Diagnosis not present

## 2023-05-08 DIAGNOSIS — K5792 Diverticulitis of intestine, part unspecified, without perforation or abscess without bleeding: Secondary | ICD-10-CM | POA: Diagnosis not present

## 2023-05-08 MED ORDER — DICLOFENAC SODIUM 1 % EX GEL
4.0000 g | Freq: Four times a day (QID) | CUTANEOUS | Status: DC
  Administered 2023-05-08 – 2023-05-09 (×5): 4 g via TOPICAL
  Filled 2023-05-08: qty 100

## 2023-05-08 MED ORDER — COLCHICINE 0.6 MG PO TABS
0.6000 mg | ORAL_TABLET | Freq: Every day | ORAL | Status: DC
  Administered 2023-05-08 – 2023-05-09 (×2): 0.6 mg via ORAL
  Filled 2023-05-08 (×2): qty 1

## 2023-05-08 NOTE — Plan of Care (Addendum)
 Pt is A&O x 4. VSS, on room air.  Dressing to right lower leg changed as ordered.  Safety maintained. Bed alarm on. Call bell in reach. Will continue to monitor.   Problem: Education: Goal: Knowledge of General Education information will improve Description: Including pain rating scale, medication(s)/side effects and non-pharmacologic comfort measures Outcome: Progressing   Problem: Health Behavior/Discharge Planning: Goal: Ability to manage health-related needs will improve Outcome: Progressing   Problem: Clinical Measurements: Goal: Ability to maintain clinical measurements within normal limits will improve Outcome: Progressing Goal: Will remain free from infection Outcome: Progressing Goal: Diagnostic test results will improve Outcome: Progressing Goal: Respiratory complications will improve Outcome: Progressing Goal: Cardiovascular complication will be avoided Outcome: Progressing   Problem: Activity: Goal: Risk for activity intolerance will decrease Outcome: Progressing   Problem: Nutrition: Goal: Adequate nutrition will be maintained Outcome: Progressing   Problem: Coping: Goal: Level of anxiety will decrease Outcome: Progressing   Problem: Elimination: Goal: Will not experience complications related to bowel motility Outcome: Progressing Goal: Will not experience complications related to urinary retention Outcome: Progressing   Problem: Pain Management: Goal: General experience of comfort will improve Outcome: Progressing   Problem: Safety: Goal: Ability to remain free from injury will improve Outcome: Progressing   Problem: Skin Integrity: Goal: Risk for impaired skin integrity will decrease Outcome: Progressing

## 2023-05-08 NOTE — Evaluation (Signed)
 Physical Therapy Evaluation Patient Details Name: Melissa Morton MRN: [id number] DOB: 07-Mar-1934 Today's Date: 05/08/2023  History of Present Illness  Patient is a 89 year old female who presented on 1/1 with 3 day history of diarrhea.CT abdomen/pelvis showed thickening and stranding of of the distal descending and sigmoid colon. Patient was admitted with acute diverticulitis and UTI. PMH: diverticulitis, hypertension, hyperlipidemia, GERD, breast cancer  Clinical Impression  Pt admitted with above diagnosis.  Pt currently with functional limitations due to the deficits listed below (see PT Problem List). Pt will benefit from acute skilled PT to increase their independence and safety with mobility to allow discharge.  Pt assisted with ambulating in hallway with RW and reports right knee pain still present but improved today.  Pt anticipates d/c home tomorrow.  Pt politely declines need for HHPT at this time and states she will have 24/7 family assist as needed.         If plan is discharge home, recommend the following: Help with stairs or ramp for entrance;Assistance with cooking/housework;Assist for transportation   Can travel by private vehicle        Equipment Recommendations None recommended by PT  Recommendations for Other Services       Functional Status Assessment Patient has had a recent decline in their functional status and demonstrates the ability to make significant improvements in function in a reasonable and predictable amount of time.     Precautions / Restrictions Precautions Precautions: None Restrictions Weight Bearing Restrictions Per Provider Order: No Other Position/Activity Restrictions: gout in R knee per MD      Mobility  Bed Mobility Overal bed mobility: Needs Assistance Bed Mobility: Supine to Sit     Supine to sit: Supervision, HOB elevated, Used rails          Transfers Overall transfer level: Needs assistance Equipment used: Rolling walker  (2 wheels) Transfers: Sit to/from Stand Sit to Stand: Contact guard assist           General transfer comment: CGA for safety, cues for safe technique    Ambulation/Gait Ambulation/Gait assistance: Contact guard assist Gait Distance (Feet): 120 Feet Assistive device: Rolling walker (2 wheels) Gait Pattern/deviations: Step-through pattern, Decreased stride length, Trunk flexed       General Gait Details: cues for RW positioning, steady with RW, 2/10 right knee pain with ambulation (improved today per pt); distance per pt tolerance  Stairs            Wheelchair Mobility     Tilt Bed    Modified Rankin (Stroke Patients Only)       Balance Overall balance assessment: Mild deficits observed, not formally tested                                           Pertinent Vitals/Pain Pain Assessment Pain Assessment: 0-10 Pain Score: 2  Pain Location: R knee Pain Descriptors / Indicators: Guarding, Discomfort, Tender Pain Intervention(s): Repositioned, Monitored during session    Home Living Family/patient expects to be discharged to:: Private residence Living Arrangements: Children;Other relatives Available Help at Discharge: Family Type of Home: House Home Access: Stairs to enter Entrance Stairs-Rails: Right Entrance Stairs-Number of Steps: 2   Home Layout: One level Home Equipment: Rollator (4 wheels);Shower seat;Cane - single point Additional Comments: family assists as needed, pt always has assist for steps in/out of house  Prior Function Prior Level of Function : Needs assist             Mobility Comments: Use of rollator in home. ADLs Comments: mostly independent with ADLs but states daughter will assist if needed     Extremity/Trunk Assessment        Lower Extremity Assessment Lower Extremity Assessment: Generalized weakness;RLE deficits/detail RLE Deficits / Details: right knee pain 2/10 with ambulation, none at rest, R LE  slightly more edematous then L LE    Cervical / Trunk Assessment Cervical / Trunk Assessment: Other exceptions Cervical / Trunk Exceptions: appears to have some curvature of her spine - none in history  Communication   Communication Communication: Hearing impairment  Cognition Arousal: Alert Behavior During Therapy: WFL for tasks assessed/performed Overall Cognitive Status: Within Functional Limits for tasks assessed                                          General Comments      Exercises     Assessment/Plan    PT Assessment Patient needs continued PT services  PT Problem List Decreased strength;Decreased activity tolerance;Decreased balance;Decreased mobility;Decreased knowledge of use of DME       PT Treatment Interventions DME instruction;Gait training;Balance training;Functional mobility training;Therapeutic activities;Therapeutic exercise;Patient/family education    PT Goals (Current goals can be found in the Care Plan section)  Acute Rehab PT Goals PT Goal Formulation: With patient Time For Goal Achievement: 05/15/23 Potential to Achieve Goals: Good    Frequency Min 1X/week     Co-evaluation               AM-PAC PT 6 Clicks Mobility  Outcome Measure Help needed turning from your back to your side while in a flat bed without using bedrails?: None Help needed moving from lying on your back to sitting on the side of a flat bed without using bedrails?: None Help needed moving to and from a bed to a chair (including a wheelchair)?: A Little Help needed standing up from a chair using your arms (e.g., wheelchair or bedside chair)?: A Little Help needed to walk in hospital room?: A Little Help needed climbing 3-5 steps with a railing? : A Little 6 Click Score: 20    End of Session   Activity Tolerance: Patient tolerated treatment well Patient left: in chair;with call bell/phone within reach Nurse Communication: Mobility status PT Visit  Diagnosis: Difficulty in walking, not elsewhere classified (R26.2)    Time: 8874-8858 PT Time Calculation (min) (ACUTE ONLY): 16 min   Charges:   PT Evaluation $PT Eval Low Complexity: 1 Low   PT General Charges $$ ACUTE PT VISIT: 1 Visit    Tari KLEIN, DPT Physical Therapist Acute Rehabilitation Services Office: [phone]   Kati L Payson 05/08/2023, 2:05 PM

## 2023-05-08 NOTE — Plan of Care (Signed)

## 2023-05-08 NOTE — Progress Notes (Signed)
 Report given to Ruel Favors, RN for continuation of care

## 2023-05-08 NOTE — Progress Notes (Signed)
 [facility] PROGRESS NOTE   Melissa Morton FMW:[id number] DOB: Jun 04, 1933 DOA: 05/04/2023  PCP: Arloa Elsie SAUNDERS, MD  Brief History: 89 y.o. female, with history of diverticulitis, hypertension, hyperlipidemia, GERD, breast cancer came to ED with complaints of diarrhea for past 3 days.  Patient was recently seen in the ED on 12/29, at that time she was discharged home as she was feeling better.  As per patient she continued to have nausea and diarrhea since discharge.  She admits to having 3-4 loose bowel movements every day.  Denies fever or chills.  Denies abdominal pain.  Denies dysuria.  In the ED CT abdomen/pelvis showed thickening and stranding of of the distal descending and sigmoid colon. Patient started on ceftriaxone . Also found to have abnormal UA.   Consultants: None  Procedures: None    Subjective/Interval History: Patient mentions that she is feeling better from her abdominal standpoint.  She does not have any pain.  No diarrhea.  No nausea vomiting.  However she is now has a pain in her right knee.     Assessment/Plan:  Acute diverticulitis CT abdomen/pelvis showed mural thickening and fat stranding involving the distal descending and rectosigmoid colon consistent with inflammatory or infectious colitis versus diverticulitis. Patient was started on ceftriaxone  and metronidazole .   Patient has improved.  She was changed over to Augmentin .  White count is normal.   Diet was advanced.   UTI Urine culture grew Klebsiella pneumoniae as well as E. Coli Sensitivities noted.  Changed to Augmentin .  Right knee pain and swelling Patient has a history of gout and osteoarthritis.  She is status post left knee replacement several years ago.  Right knee was examined.  No erythema noted.  No warmth noted.  Swelling is present.  X-ray was done which does show joint effusion.  Most likely this is gout.  Do not suspect septic arthritis. Considering her abdominal infection  would hesitate using systemic steroids.  Will give her colchicine  and topical treatment.  She does have limited creatinine so hesitate using NSAIDs as well.   If there is no improvement in 24 hours will request her orthopedics to consider doing arthrocentesis and intra-articular steroid injection.  Follow[facility] Jacklin at [facility].   Essential Hypertension Amlodipine  held due to soft blood pressures.  Stable currently.  Normocytic anemia Stable hemoglobin noted.  No overt bleeding noted.  Can be pursued in the outpatient setting.   History of gout Noted to be on allopurinol .  See above.   Hyperlipidemia -Continue Crestor    Hypokalemia -Supplemented   Hypocalcemia -Calcium  7.5 -Corrected calcium  for hypoalbuminemia is 8.7  Right lower extremity wound She mentioned that this was caused by her daughter's pet.  No bites but she apparently ran into something as a result of the pet.  Continue wound care.  This is apparently being followed by her primary care provider.   DVT Prophylaxis: Lovenox  Code Status: Full code Family Communication: Discussed with her daughter yesterday.  Will call her daughter again today Disposition Plan: Anticipate discharge home when improved.  Patient mentions that she has been ambulating in the room.     Medications: Scheduled:  allopurinol   150 mg Oral Daily   amoxicillin -clavulanate  1 tablet Oral Q12H   aspirin   81 mg Oral Once per day on Monday Thursday   colchicine   0.6 mg Oral Daily   diclofenac  Sodium  4 g Topical QID   enoxaparin  (LOVENOX ) injection  40 mg Subcutaneous Q24H   fluticasone   1-2 spray Each Nare QHS   gabapentin   100 mg Oral BID   leptospermum manuka honey  1 Application Topical Daily   montelukast   10 mg Oral QHS   rosuvastatin   10 mg Oral QHS   Continuous:   PRN:acetaminophen  **OR** acetaminophen , albuterol , hydrALAZINE , ondansetron  **OR** ondansetron  (ZOFRAN ) IV, ondansetron  (ZOFRAN )  IV  Antibiotics: Anti-infectives (From admission, onward)    Start     Dose/Rate Route Frequency Ordered Stop   05/07/23 1000  amoxicillin -clavulanate (AUGMENTIN ) 875-125 MG per tablet 1 tablet        1 tablet Oral Every 12 hours 05/07/23 0907     05/05/23 1600  cefTRIAXone  (ROCEPHIN ) 2 g in sodium chloride  0.9 % 100 mL IVPB  Status:  Discontinued        2 g 200 mL/hr over 30 Minutes Intravenous Every 24 hours 05/04/23 1737 05/07/23 0907   05/04/23 1900  cefTRIAXone  (ROCEPHIN ) 1 g in sodium chloride  0.9 % 100 mL IVPB        1 g 200 mL/hr over 30 Minutes Intravenous STAT 05/04/23 1833 05/04/23 2045   05/04/23 1830  metroNIDAZOLE  (FLAGYL ) IVPB 500 mg  Status:  Discontinued        500 mg 100 mL/hr over 60 Minutes Intravenous Every 12 hours 05/04/23 1737 05/07/23 0907   05/04/23 1530  cefTRIAXone  (ROCEPHIN ) 1 g in sodium chloride  0.9 % 100 mL IVPB        1 g 200 mL/hr over 30 Minutes Intravenous  Once 05/04/23 1525 05/04/23 1707       Objective:  Vital Signs  Vitals:   05/07/23 1200 05/07/23 1959 05/07/23 2025 05/08/23 0455  BP:  (!) 139/54  133/63  Pulse: (!) 110 (!) 52 (!) 58 68  Resp:  19 20 18   Temp:  97.9 F (36.6 C)  98.6 F (37 C)  TempSrc:      SpO2:  100%  97%  Weight:      Height:        Intake/Output Summary (Last 24 hours) at 05/08/2023 1018 Last data filed at 05/08/2023 0830 Gross per 24 hour  Intake 340 ml  Output 100 ml  Net 240 ml   Filed Weights   05/04/23 1003 05/04/23 2000  Weight: 65.8 kg 65.8 kg    General appearance: Awake alert.  In no distress Resp: Clear to auscultation bilaterally.  Normal effort Cardio: S1-S2 is normal regular.  No S3-S4.  No rubs murmurs or bruit GI: Abdomen is soft.  Nontender nondistended.  Bowel sounds are present normal.  No masses organomegaly Extremities: Swelling of the right knee is noted without any erythema or warmth.  Restricted range of motion. Neurologic: Alert and oriented x3.  No focal neurological  deficits.     Lab Results:  Data Reviewed: I have personally reviewed following labs and reports of the imaging studies  CBC: Recent Labs  Lab 05/01/23 1209 05/04/23 1021 05/04/23 1940 05/05/23 0440 05/06/23 0437 05/07/23 0527  WBC 13.6* 12.1* 11.5* 9.0 9.2 9.6  NEUTROABS 11.7* 9.0*  --   --   --   --   HGB 12.1 11.8* 11.0* 9.9* 10.0* 10.4*  HCT 37.7 37.5 33.2* 30.3* 31.6* 31.9*  MCV 93.1 92.4 91.7 91.5 94.3 91.7  PLT 255 304 254 237 252 276    Basic Metabolic Panel: Recent Labs  Lab 05/01/23 1209 05/04/23 1021 05/04/23 1940 05/05/23 0440 05/06/23 0437 05/07/23 0527  NA 139 136  --  135 139 136  K 3.4* 2.9*  --  3.4* 3.7 3.9  CL 108 104  --  106 112* 111  CO2 21* 21*  --  19* 19* 20*  GLUCOSE 101* 92  --  92 82 89  BUN 17 32*  --  26* 22 17  CREATININE 1.03* 1.63* 1.19* 1.10* 1.07* 1.08*  CALCIUM  8.8* 9.0  --  7.7* 7.5* 7.4*    GFR: Estimated Creatinine Clearance: 33.5 mL/min (A) (by C-G formula based on SCr of 1.08 mg/dL (H)).  Liver Function Tests: Recent Labs  Lab 05/01/23 1209 05/04/23 1021 05/05/23 0440 05/06/23 0437  AST 15 15 10* 10*  ALT 9 8 9 8   ALKPHOS 76 82 61 53  BILITOT 0.7 0.6 0.5 0.3  PROT 6.8 6.9 5.4* 5.1*  ALBUMIN 3.6 3.6 2.6* 2.5*    Recent Labs  Lab 05/01/23 1209 05/04/23 1021  LIPASE 31 34     Recent Results (from the past 240 hours)  Urine Culture     Status: Abnormal   Collection Time: 05/04/23  2:48 PM   Specimen: Urine, Catheterized  Result Value Ref Range Status   Specimen Description   Final    URINE, CATHETERIZED Performed at [facility], [address] [place], [place] [address]    Special Requests   Final    NONE Performed at [facility], [address], [place], [place] [address]    Culture (A)  Final    >=100,000 COLONIES/mL ESCHERICHIA COLI 60,000 COLONIES/mL KLEBSIELLA PNEUMONIAE    Report Status 05/06/2023 FINAL  Final   Organism ID, Bacteria ESCHERICHIA  COLI (A)  Final   Organism ID, Bacteria KLEBSIELLA PNEUMONIAE (A)  Final      Susceptibility   Escherichia coli - MIC*    AMPICILLIN 8 SENSITIVE Sensitive     CEFAZOLIN  <=4 SENSITIVE Sensitive     CEFEPIME  <=0.12 SENSITIVE Sensitive     CEFTRIAXONE  <=0.25 SENSITIVE Sensitive     CIPROFLOXACIN  1 RESISTANT Resistant     GENTAMICIN <=1 SENSITIVE Sensitive     IMIPENEM <=0.25 SENSITIVE Sensitive     NITROFURANTOIN <=16 SENSITIVE Sensitive     TRIMETH /SULFA  <=20 SENSITIVE Sensitive     AMPICILLIN/SULBACTAM 4 SENSITIVE Sensitive     PIP/TAZO <=4 SENSITIVE Sensitive ug/mL    * >=100,000 COLONIES/mL ESCHERICHIA COLI   Klebsiella pneumoniae - MIC*    AMPICILLIN RESISTANT Resistant     CEFAZOLIN  <=4 SENSITIVE Sensitive     CEFEPIME  <=0.12 SENSITIVE Sensitive     CEFTRIAXONE  <=0.25 SENSITIVE Sensitive     CIPROFLOXACIN  <=0.25 SENSITIVE Sensitive     GENTAMICIN <=1 SENSITIVE Sensitive     IMIPENEM <=0.25 SENSITIVE Sensitive     NITROFURANTOIN 64 INTERMEDIATE Intermediate     TRIMETH /SULFA  >=320 RESISTANT Resistant     AMPICILLIN/SULBACTAM 4 SENSITIVE Sensitive     PIP/TAZO <=4 SENSITIVE Sensitive ug/mL    * 60,000 COLONIES/mL KLEBSIELLA PNEUMONIAE  C Difficile Quick Screen w PCR reflex     Status: None   Collection Time: 05/04/23  3:38 PM   Specimen: STOOL  Result Value Ref Range Status   C Diff antigen NEGATIVE NEGATIVE Final   C Diff toxin NEGATIVE NEGATIVE Final   C Diff interpretation No C. difficile detected.  Final    Comment: Performed at [facility], [address], [place], [place] [address]      Radiology Studies: DG Knee 1-2 Views Right Result Date: 05/07/2023 CLINICAL DATA:  Right knee pain for 2 days, no known injury, initial encounter EXAM:  RIGHT KNEE - 2 VIEW COMPARISON:  None Available. FINDINGS: Tricompartmental degenerative changes are noted. Moderate joint effusion is noted. No acute fracture or dislocation is seen. No other soft tissue abnormality  is noted. IMPRESSION: Degenerative change with joint effusion. Electronically Signed   By: Oneil Devonshire M.D.   On: 05/07/2023 18:15       LOS: 4 days   Melissa Morton  [facility] Pager on [email or web address]  05/08/2023, 10:18 AM

## 2023-05-09 DIAGNOSIS — K5792 Diverticulitis of intestine, part unspecified, without perforation or abscess without bleeding: Secondary | ICD-10-CM | POA: Diagnosis not present

## 2023-05-09 LAB — BASIC METABOLIC PANEL
Anion gap: 7 (ref 5–15)
BUN: 18 mg/dL (ref 8–23)
CO2: 20 mmol/L — ABNORMAL LOW (ref 22–32)
Calcium: 7.6 mg/dL — ABNORMAL LOW (ref 8.9–10.3)
Chloride: 111 mmol/L (ref 98–111)
Creatinine, Ser: 0.97 mg/dL (ref 0.44–1.00)
GFR, Estimated: 57 mL/min — ABNORMAL LOW (ref >60)
Glucose, Bld: 94 mg/dL (ref 70–99)
Potassium: 3.7 mmol/L (ref 3.5–5.1)
Sodium: 138 mmol/L (ref 135–145)

## 2023-05-09 LAB — URIC ACID: Uric Acid, Serum: 5.1 mg/dL (ref 2.5–7.1)

## 2023-05-09 LAB — CBC
HCT: 30.3 % — ABNORMAL LOW (ref 36.0–46.0)
Hemoglobin: 9.7 g/dL — ABNORMAL LOW (ref 12.0–15.0)
MCH: 29.2 pg (ref 26.0–34.0)
MCHC: 32 g/dL (ref 30.0–36.0)
MCV: 91.3 fL (ref 80.0–100.0)
Platelets: 303 10*3/uL (ref 150–400)
RBC: 3.32 MIL/uL — ABNORMAL LOW (ref 3.87–5.11)
RDW: 14.8 % (ref 11.5–15.5)
WBC: 8.7 10*3/uL (ref 4.0–10.5)
nRBC: 0 % (ref 0.0–0.2)

## 2023-05-09 MED ORDER — COLCHICINE 0.6 MG PO TABS: ORAL_TABLET | ORAL | 0 refills | Status: AC

## 2023-05-09 MED ORDER — DICLOFENAC SODIUM 1 % EX GEL: 4.0000 g | Freq: Four times a day (QID) | CUTANEOUS | Status: AC

## 2023-05-09 MED ORDER — AMOXICILLIN-POT CLAVULANATE 875-125 MG PO TABS: 1.0000 | ORAL_TABLET | Freq: Two times a day (BID) | ORAL | 0 refills | Status: AC

## 2023-05-09 NOTE — Care Management Important Message (Signed)
 Important Message  Patient Details IM Letter given. Name: Melissa Morton MRN: [id number] Date of Birth: May 17, 1933   Important Message Given:  Yes - Medicare IM     Caren Macadam 05/09/2023, 9:47 AM

## 2023-05-09 NOTE — Progress Notes (Signed)
 No change from am assessment. The patient is stable at this time. She remains alert, oriented x4 and ambulatory with walker. Discharge instructions were reviewed, questions or concerns were denied at this time. Dressing changed to RLE.

## 2023-05-09 NOTE — Plan of Care (Signed)

## 2023-05-09 NOTE — Progress Notes (Signed)
 Mobility Specialist - Progress Note   05/09/23 0941  Therapy Vitals  Pulse Rate (!) 151  Resp 16  BP (!) 141/57  Patient Position (if appropriate) Sitting  Oxygen Therapy  SpO2 97 %  O2 Device Room Air  Mobility  Activity Transferred from bed to chair  Level of Assistance Standby assist, set-up cues, supervision of patient - no hands on  Assistive Device Front wheel walker  Distance Ambulated (ft) 2 ft  Activity Response Tolerated well  Mobility Referral Yes  Mobility visit 1 Mobility  Mobility Specialist Start Time (ACUTE ONLY) [id number]  Mobility Specialist Stop Time (ACUTE ONLY) 0911  Mobility Specialist Time Calculation (min) (ACUTE ONLY) 7 min   Pt received in bed and agreeable to transfer to the recliner for breakfast. Pt was minA from supine>sitting & STS. No complaints during session. Pt to recliner after session with all needs met.    [facility]

## 2023-05-09 NOTE — Discharge Summary (Signed)
 [facility]  Physician Discharge Summary   Patient ID: ODALIS JORDAN MRN: [id number] DOB/AGE: [id number] 88 y.o.  Admit date: 05/04/2023 Discharge date: 05/09/2023    PCP: Arloa Elsie SAUNDERS, MD  DISCHARGE DIAGNOSES:  Acute diverticulitis Acute urinary tract infection Right knee pain and swelling likely due to osteoarthritis History of gout Essential hypertension Normocytic anemia Hyperlipidemia Wound over the right lower extremity, subacute   RECOMMENDATIONS FOR OUTPATIENT FOLLOW UP: Follow-up with PCP for further management of the right lower extremity wound PCP to consider referral to gastroenterology for diverticulitis  Patient to call her orthopedics if right knee pain persists or gets worse.   Home Health: None Equipment/Devices: None  CODE STATUS: Full code  DISCHARGE CONDITION: fair  Diet recommendation: As before  INITIAL HISTORY: 89 y.o. female, with history of diverticulitis, hypertension, hyperlipidemia, GERD, breast cancer came to ED with complaints of diarrhea for past 3 days.  Patient was recently seen in the ED on 12/29, at that time she was discharged home as she was feeling better.  As per patient she continued to have nausea and diarrhea since discharge.  She admits to having 3-4 loose bowel movements every day.  Denies fever or chills.  Denies abdominal pain.  Denies dysuria.  In the ED CT abdomen/pelvis showed thickening and stranding of of the distal descending and sigmoid colon. Patient started on ceftriaxone . Also found to have abnormal UA.   HOSPITAL COURSE:   Acute diverticulitis CT abdomen/pelvis showed mural thickening and fat stranding involving the distal descending and rectosigmoid colon consistent with inflammatory or infectious colitis versus diverticulitis. Patient was started on ceftriaxone  and metronidazole .   Patient has improved.  She was changed over to Augmentin .  White count is normal.   Diet was advanced.   UTI Urine  culture grew Klebsiella pneumoniae as well as E. Coli Sensitivities noted.  Changed to Augmentin .   Right knee pain and swelling Patient has a history of gout and osteoarthritis.  She is status post left knee replacement several years ago.  Right knee was examined.  No erythema noted.  No warmth noted.  Swelling is present.  X-ray was done which does show joint effusion.   Do not suspect septic arthritis.  This is most likely osteoarthritis or gout.   She was given colchicine  and topical agents.  Feels much better this morning with improved range of motion of the right knee.  She has been asked to take colchicine  daily for the next 5 days.  She was told that if symptoms do not improve or get worse she needs to seek attention by calling her orthopedic surgeon.  Followed by Dr. Jane at [facility].   Essential Hypertension   Normocytic anemia Stable hemoglobin noted.  No overt bleeding noted.  Can be pursued in the outpatient setting.   History of gout Noted to be on allopurinol .  See above.   Hyperlipidemia -Continue Crestor    Hypokalemia -Supplemented   Right lower extremity wound She mentioned that this was caused by her daughter's pet.  No bites but she apparently ran into something as a result of the pet.  Continue wound care.  This is apparently being followed by her primary care provider.  Supposed to be seen at the wound care center in February.    Patient feels better this morning.  Wants to go home.  Okay for discharge.   PERTINENT LABS:  The results of significant diagnostics from this hospitalization (including imaging, microbiology, ancillary and laboratory) are listed  below for reference.    Microbiology: Recent Results (from the past 240 hours)  Urine Culture     Status: Abnormal   Collection Time: 05/04/23  2:48 PM   Specimen: Urine, Catheterized  Result Value Ref Range Status   Specimen Description   Final    URINE, CATHETERIZED Performed at [facility], [address], [place], [place] [address]    Special Requests   Final    NONE Performed at [facility], [address], [place], [place] [address]    Culture (A)  Final    >=100,000 COLONIES/mL ESCHERICHIA COLI 60,000 COLONIES/mL KLEBSIELLA PNEUMONIAE    Report Status 05/06/2023 FINAL  Final   Organism ID, Bacteria ESCHERICHIA COLI (A)  Final   Organism ID, Bacteria KLEBSIELLA PNEUMONIAE (A)  Final      Susceptibility   Escherichia coli - MIC*    AMPICILLIN 8 SENSITIVE Sensitive     CEFAZOLIN  <=4 SENSITIVE Sensitive     CEFEPIME  <=0.12 SENSITIVE Sensitive     CEFTRIAXONE  <=0.25 SENSITIVE Sensitive     CIPROFLOXACIN  1 RESISTANT Resistant     GENTAMICIN <=1 SENSITIVE Sensitive     IMIPENEM <=0.25 SENSITIVE Sensitive     NITROFURANTOIN <=16 SENSITIVE Sensitive     TRIMETH /SULFA  <=20 SENSITIVE Sensitive     AMPICILLIN/SULBACTAM 4 SENSITIVE Sensitive     PIP/TAZO <=4 SENSITIVE Sensitive ug/mL    * >=100,000 COLONIES/mL ESCHERICHIA COLI   Klebsiella pneumoniae - MIC*    AMPICILLIN RESISTANT Resistant     CEFAZOLIN  <=4 SENSITIVE Sensitive     CEFEPIME  <=0.12 SENSITIVE Sensitive     CEFTRIAXONE  <=0.25 SENSITIVE Sensitive     CIPROFLOXACIN  <=0.25 SENSITIVE Sensitive     GENTAMICIN <=1 SENSITIVE Sensitive     IMIPENEM <=0.25 SENSITIVE Sensitive     NITROFURANTOIN 64 INTERMEDIATE Intermediate     TRIMETH /SULFA  >=320 RESISTANT Resistant     AMPICILLIN/SULBACTAM 4 SENSITIVE Sensitive     PIP/TAZO <=4 SENSITIVE Sensitive ug/mL    * 60,000 COLONIES/mL KLEBSIELLA PNEUMONIAE  C Difficile Quick Screen w PCR reflex     Status: None   Collection Time: 05/04/23  3:38 PM   Specimen: STOOL  Result Value Ref Range Status   C Diff antigen NEGATIVE NEGATIVE Final   C Diff toxin NEGATIVE NEGATIVE Final   C Diff interpretation No C. difficile detected.  Final    Comment: Performed at [facility], [address], [place], [place]  [address]     Labs:   Basic Metabolic Panel: Recent Labs  Lab 05/04/23 1021 05/04/23 1940 05/05/23 0440 05/06/23 0437 05/07/23 0527 05/09/23 0420  NA 136  --  135 139 136 138  K 2.9*  --  3.4* 3.7 3.9 3.7  CL 104  --  106 112* 111 111  CO2 21*  --  19* 19* 20* 20*  GLUCOSE 92  --  92 82 89 94  BUN 32*  --  26* 22 17 18   CREATININE 1.63* 1.19* 1.10* 1.07* 1.08* 0.97  CALCIUM  9.0  --  7.7* 7.5* 7.4* 7.6*   Liver Function Tests: Recent Labs  Lab 05/04/23 1021 05/05/23 0440 05/06/23 0437  AST 15 10* 10*  ALT 8 9 8   ALKPHOS 82 61 53  BILITOT 0.6 0.5 0.3  PROT 6.9 5.4* 5.1*  ALBUMIN 3.6 2.6* 2.5*   Recent Labs  Lab 05/04/23 1021  LIPASE 34    CBC: Recent Labs  Lab 05/04/23 1021 05/04/23 1940 05/05/23 0440  05/06/23 0437 05/07/23 0527 05/09/23 0420  WBC 12.1* 11.5* 9.0 9.2 9.6 8.7  NEUTROABS 9.0*  --   --   --   --   --   HGB 11.8* 11.0* 9.9* 10.0* 10.4* 9.7*  HCT 37.5 33.2* 30.3* 31.6* 31.9* 30.3*  MCV 92.4 91.7 91.5 94.3 91.7 91.3  PLT 304 254 237 252 276 303      IMAGING STUDIES DG Knee 1-2 Views Right Result Date: 05/07/2023 CLINICAL DATA:  Right knee pain for 2 days, no known injury, initial encounter EXAM: RIGHT KNEE - 2 VIEW COMPARISON:  None Available. FINDINGS: Tricompartmental degenerative changes are noted. Moderate joint effusion is noted. No acute fracture or dislocation is seen. No other soft tissue abnormality is noted. IMPRESSION: Degenerative change with joint effusion. Electronically Signed   By: Oneil Devonshire M.D.   On: 05/07/2023 18:15   CT ABDOMEN PELVIS W CONTRAST Result Date: 05/04/2023 CLINICAL DATA:  Nausea/vomiting/diarrhea since Sunday, abdominal pain EXAM: CT ABDOMEN AND PELVIS WITH CONTRAST TECHNIQUE: Multidetector CT imaging of the abdomen and pelvis was performed using the standard protocol following bolus administration of intravenous contrast. RADIATION DOSE REDUCTION: This exam was performed according to the departmental  dose-optimization program which includes automated exposure control, adjustment of the mA and/or kV according to patient size and/or use of iterative reconstruction technique. CONTRAST:  80mL OMNIPAQUE  IOHEXOL  300 MG/ML  SOLN COMPARISON:  02/03/2022 FINDINGS: Lower chest: No acute pleural or parenchymal lung disease. Moderate hiatal hernia. Hepatobiliary: Prior cholecystectomy. Interval progression of intrahepatic and extrahepatic biliary duct dilation, with common bile duct measuring up to 15 mm, likely reservoir effect after cholecystectomy. No filling defects. Liver is otherwise unremarkable. Pancreas: Unremarkable. No pancreatic ductal dilatation or surrounding inflammatory changes. Spleen: Normal in size without focal abnormality. Adrenals/Urinary Tract: Stable benign nodularity of the left adrenal gland given long-term stability. The right adrenals unremarkable. Bilateral renal cortical thinning. No urinary tract calculi or obstructive uropathy within either kidney. Bladder wall is trabeculated, with subtle increased enhancement throughout the bladder mucosa and bilateral ureters. This could reflect urinary tract infection and cystitis. Please correlate with urinalysis. Stomach/Bowel: No bowel obstruction or ileus. There is long segment mural thickening of the distal descending and rectosigmoid colon with surrounding fat stranding, consistent with inflammatory or infectious colitis versus diverticulitis. No perforation, fluid collection, or abscess. Normal appendix right lower quadrant. Vascular/Lymphatic: Aortic atherosclerosis. No enlarged abdominal or pelvic lymph nodes. Reproductive: Status post hysterectomy. No adnexal masses. Other: No free fluid or free intraperitoneal gas. Stable small fat containing midline ventral hernia. No bowel herniation. Musculoskeletal: No acute or destructive bony abnormalities. Reconstructed images demonstrate no additional findings. IMPRESSION: 1. Mural thickening and fat  stranding involving the distal descending and rectosigmoid colon, consistent with inflammatory or infectious colitis versus diverticulitis. No perforation, fluid collection, or abscess. 2. Subtle mucosal enhancement of the ureters and bladder mucosa, which could reflect urinary tract infection and cystitis. 3. Intrahepatic and extrahepatic biliary duct dilation, more prominent than prior study, which may reflect reservoir effect in a patient status post cholecystectomy. No filling defects or choledocholithiasis. 4. Moderate hiatal hernia. 5. Stable fat containing midline supraumbilical ventral hernia. 6.  Aortic Atherosclerosis (ICD10-I70.0). Electronically Signed   By: Ozell Daring M.D.   On: 05/04/2023 15:46    DISCHARGE EXAMINATION: Vitals:   05/08/23 1236 05/08/23 2020 05/09/23 0428 05/09/23 0941  BP: 127/76 (!) 148/89 (!) 133/54 (!) 141/57  Pulse: 71 65 62 66  Resp: 16 19 17 16   Temp: 98 F (36.7  C) 97.7 F (36.5 C) 98.3 F (36.8 C)   TempSrc: Oral Oral    SpO2: 98% 100% 96% 97%  Weight:      Height:       General appearance: Awake alert.  In no distress Resp: Clear to auscultation bilaterally.  Normal effort Cardio: S1-S2 is normal regular.  No S3-S4.  No rubs murmurs or bruit GI: Abdomen is soft.  Nontender nondistended.  Bowel sounds are present normal.  No masses organomegaly Mild swelling of the right knee is noted with improved range of motion today compared to yesterday.  No warmth or erythema noted.   DISPOSITION: Home  Discharge Instructions     Call MD for:  difficulty breathing, headache or visual disturbances   Complete by: As directed    Call MD for:  extreme fatigue   Complete by: As directed    Call MD for:  persistant dizziness or light-headedness   Complete by: As directed    Call MD for:  persistant nausea and vomiting   Complete by: As directed    Call MD for:  severe uncontrolled pain   Complete by: As directed    Call MD for:  temperature >100.4    Complete by: As directed    Diet - low sodium heart healthy   Complete by: As directed    Discharge instructions   Complete by: As directed    Please take your medications as prescribed.  Please be sure to follow-up with your primary care provider and your orthopedic surgeon as instructed.  You were cared for by a hospitalist during your hospital stay. If you have any questions about your discharge medications or the care you received while you were in the hospital after you are discharged, you can call the unit and asked to speak with the hospitalist on call if the hospitalist that took care of you is not available. Once you are discharged, your primary care physician will handle any further medical issues. Please note that NO REFILLS for any discharge medications will be authorized once you are discharged, as it is imperative that you return to your primary care physician (or establish a relationship with a primary care physician if you do not have one) for your aftercare needs so that they can reassess your need for medications and monitor your lab values. If you do not have a primary care physician, you can call [phone] for a physician referral.   Increase activity slowly   Complete by: As directed    No wound care   Complete by: As directed          Allergies as of 05/09/2023       Reactions   Ciprofloxacin  Other (See Comments)   Dizziness   Cyclosporine Other (See Comments)   Possible dizziness   Dilaudid  [hydromorphone ] Nausea And Vomiting   Nsaids Other (See Comments)   11/09/11-  Avoid due to kidney disease (and possibly liver function, too) Can tolerate low dose Aspirin     Prednisone Nausea And Vomiting        Medication List     TAKE these medications    alendronate 70 MG tablet Commonly known as: FOSAMAX Take 70 mg by mouth every Wednesday. Take with a full glass of water on an empty stomach.   ALIGN PREBIOTIC-PROBIOTIC PO Take 1 capsule by mouth daily.    allopurinol  300 MG tablet Commonly known as: ZYLOPRIM  Take 150 mg by mouth daily.   amLODipine  2.5 MG tablet Commonly  known as: NORVASC  Take 2.5 mg by mouth in the morning.   amoxicillin -clavulanate 875-125 MG tablet Commonly known as: AUGMENTIN  Take 1 tablet by mouth every 12 (twelve) hours for 5 days.   aspirin  81 MG chewable tablet Chew 81 mg by mouth See admin instructions. Chew 81 mg by mouth on Mondays and Thursdays   CALCIUM  + D3 PO Take 2 tablets by mouth daily with breakfast.   colchicine  0.6 MG tablet Take 1 tablet daily for 5 days and then as needed What changed:  how much to take how to take this when to take this reasons to take this additional instructions   diclofenac  Sodium 1 % Gel Commonly known as: VOLTAREN  Apply 4 g topically 4 (four) times daily. This is Melissaapp hide this islyKrishnan how you to right .et kn. ago she said that she is feeling much better.  eI saw   dicyclomine  20 MG tablet Commonly known as: BENTYL  Take 1 tablet (20 mg total) by mouth 2 (two) times daily.   DRY EYE RELIEF OP Place 1-2 drops into both eyes 4 (four) times daily as needed (for dryness).   esomeprazole 40 MG capsule Commonly known as: NEXIUM Take 40 mg by mouth daily before breakfast.   famotidine 20 MG tablet Commonly known as: PEPCID Take 20 mg by mouth at bedtime.   ferrous sulfate  325 (65 FE) MG EC tablet Take 325 mg by mouth at bedtime.   fluticasone  50 MCG/ACT nasal spray Commonly known as: FLONASE  Place 1-2 sprays into both nostrils at bedtime.   furosemide 20 MG tablet Commonly known as: LASIX Take 20 mg by mouth in the morning.   gabapentin  100 MG capsule Commonly known as: Neurontin  Take 1 capsule (100 mg total) by mouth 2 (two) times daily.   montelukast  10 MG tablet Commonly known as: SINGULAIR  Take 10 mg by mouth at bedtime.   Multivitamin Gummies Womens Chew Chew 2 tablets by mouth in the morning.   NON FORMULARY Take 2 capsules by  mouth See admin instructions. D-Mannose & Cranberry Extract Advanced Formula, Fast-Acting Natural Urinary Tract Health Support capsules- Take 2 capsules by mouth at bedtime   ondansetron  4 MG tablet Commonly known as: ZOFRAN  Take 4 mg by mouth every 8 (eight) hours as needed for nausea or vomiting.   ProAir  HFA 108 (90 Base) MCG/ACT inhaler Generic drug: albuterol  Inhale 2 puffs into the lungs every 6 (six) hours as needed for wheezing or shortness of breath.   promethazine  12.5 MG tablet Commonly known as: PHENERGAN  Take 12.5 mg by mouth every 6 (six) hours as needed for nausea or vomiting.   rosuvastatin  10 MG tablet Commonly known as: CRESTOR  Take 10 mg by mouth at bedtime.   Salonpas 3.05-08-08 % Ptch Generic drug: Camphor-Menthol -Methyl Sal Place 1 patch onto the skin daily as needed (for pain- and remove as directed).   traMADol  50 MG tablet Commonly known as: ULTRAM  Take 50 mg by mouth every 6 (six) hours as needed for moderate pain or severe pain (pain).   trimethoprim  100 MG tablet Commonly known as: TRIMPEX  Take 100 mg by mouth at bedtime.   Wixela Inhub 100-50 MCG/ACT Aepb Generic drug: fluticasone -salmeterol Inhale 2 puffs into the lungs 2 (two) times daily.   ZyrTEC Allergy 10 MG tablet Generic drug: cetirizine Take 10 mg by mouth at bedtime.          Follow-up Information     Arloa Elsie SAUNDERS, MD. Schedule an appointment as soon as possible for  a visit in 1 week(s).   Specialty: Family Medicine Why: post hospitalization follow up.  Please also discuss referral to gastroenterology if indicated. Contact information: [address] [place] [address] [phone]         Jane Charleston, MD Follow up.   Specialty: Orthopedic Surgery Why: For persistent right knee pain Contact information: [address] [place] [address] [phone]                 TOTAL DISCHARGE TIME: 35 minutes  Anarely Nicholls  Verdene  [facility] Pager on [email or web address]  05/10/2023, 11:12 AM

## 2023-05-10 DIAGNOSIS — D508 Other iron deficiency anemias: Secondary | ICD-10-CM | POA: Diagnosis not present

## 2023-05-10 DIAGNOSIS — E7800 Pure hypercholesterolemia, unspecified: Secondary | ICD-10-CM | POA: Diagnosis not present

## 2023-05-10 DIAGNOSIS — N184 Chronic kidney disease, stage 4 (severe): Secondary | ICD-10-CM | POA: Diagnosis not present

## 2023-05-10 DIAGNOSIS — Z853 Personal history of malignant neoplasm of breast: Secondary | ICD-10-CM | POA: Diagnosis not present

## 2023-05-10 DIAGNOSIS — M179 Osteoarthritis of knee, unspecified: Secondary | ICD-10-CM | POA: Diagnosis not present

## 2023-05-10 DIAGNOSIS — I10 Essential (primary) hypertension: Secondary | ICD-10-CM | POA: Diagnosis not present

## 2023-05-10 DIAGNOSIS — N390 Urinary tract infection, site not specified: Secondary | ICD-10-CM | POA: Diagnosis not present

## 2023-05-31 DIAGNOSIS — L97912 Non-pressure chronic ulcer of unspecified part of right lower leg with fat layer exposed: Secondary | ICD-10-CM | POA: Diagnosis not present

## 2023-05-31 DIAGNOSIS — R600 Localized edema: Secondary | ICD-10-CM | POA: Diagnosis not present

## 2023-05-31 DIAGNOSIS — N390 Urinary tract infection, site not specified: Secondary | ICD-10-CM | POA: Diagnosis not present

## 2023-06-10 ENCOUNTER — Ambulatory Visit
Admission: RE | Admit: 2023-06-10 | Discharge: 2023-06-10 | Disposition: A | Discharge status: Home / Self Care | Payer: 59 | Source: Ambulatory Visit | Attending: Family Medicine | Admitting: Family Medicine

## 2023-06-10 ENCOUNTER — Other Ambulatory Visit: Payer: Self-pay | Admitting: Family Medicine

## 2023-06-10 ENCOUNTER — Ambulatory Visit: Payer: 59 | Admitting: Physician Assistant

## 2023-06-10 DIAGNOSIS — Z09 Encounter for follow-up examination after completed treatment for conditions other than malignant neoplasm: Secondary | ICD-10-CM | POA: Diagnosis not present

## 2023-06-10 DIAGNOSIS — Z8701 Personal history of pneumonia (recurrent): Secondary | ICD-10-CM

## 2023-06-16 DIAGNOSIS — S81801D Unspecified open wound, right lower leg, subsequent encounter: Secondary | ICD-10-CM | POA: Diagnosis not present

## 2023-06-16 DIAGNOSIS — D1730 Benign lipomatous neoplasm of skin and subcutaneous tissue of unspecified sites: Secondary | ICD-10-CM | POA: Diagnosis not present

## 2023-06-23 DIAGNOSIS — N390 Urinary tract infection, site not specified: Secondary | ICD-10-CM | POA: Diagnosis not present

## 2023-06-23 DIAGNOSIS — N184 Chronic kidney disease, stage 4 (severe): Secondary | ICD-10-CM | POA: Diagnosis not present

## 2023-06-23 DIAGNOSIS — D631 Anemia in chronic kidney disease: Secondary | ICD-10-CM | POA: Diagnosis not present

## 2023-06-23 DIAGNOSIS — E559 Vitamin D deficiency, unspecified: Secondary | ICD-10-CM | POA: Diagnosis not present

## 2023-06-23 DIAGNOSIS — N189 Chronic kidney disease, unspecified: Secondary | ICD-10-CM | POA: Diagnosis not present

## 2023-06-23 DIAGNOSIS — I129 Hypertensive chronic kidney disease with stage 1 through stage 4 chronic kidney disease, or unspecified chronic kidney disease: Secondary | ICD-10-CM | POA: Diagnosis not present

## 2023-07-04 DIAGNOSIS — Z1231 Encounter for screening mammogram for malignant neoplasm of breast: Secondary | ICD-10-CM | POA: Diagnosis not present

## 2023-07-15 DIAGNOSIS — T148XXA Other injury of unspecified body region, initial encounter: Secondary | ICD-10-CM | POA: Diagnosis not present

## 2023-07-21 DIAGNOSIS — H905 Unspecified sensorineural hearing loss: Secondary | ICD-10-CM | POA: Diagnosis not present

## 2023-08-10 DIAGNOSIS — H04123 Dry eye syndrome of bilateral lacrimal glands: Secondary | ICD-10-CM | POA: Diagnosis not present

## 2023-08-10 DIAGNOSIS — H52223 Regular astigmatism, bilateral: Secondary | ICD-10-CM | POA: Diagnosis not present

## 2023-08-10 DIAGNOSIS — H5203 Hypermetropia, bilateral: Secondary | ICD-10-CM | POA: Diagnosis not present

## 2023-08-25 ENCOUNTER — Encounter: Payer: Self-pay | Admitting: Family Medicine

## 2023-08-25 ENCOUNTER — Other Ambulatory Visit: Payer: Self-pay | Admitting: Family Medicine

## 2023-08-25 DIAGNOSIS — D3500 Benign neoplasm of unspecified adrenal gland: Secondary | ICD-10-CM

## 2023-09-22 ENCOUNTER — Ambulatory Visit
Admission: RE | Admit: 2023-09-22 | Discharge: 2023-09-22 | Disposition: A | Discharge status: Home / Self Care | Source: Ambulatory Visit | Attending: Family Medicine | Admitting: Family Medicine

## 2023-09-22 DIAGNOSIS — D3500 Benign neoplasm of unspecified adrenal gland: Secondary | ICD-10-CM

## 2023-09-22 DIAGNOSIS — I10 Essential (primary) hypertension: Secondary | ICD-10-CM | POA: Diagnosis not present

## 2023-09-22 DIAGNOSIS — K219 Gastro-esophageal reflux disease without esophagitis: Secondary | ICD-10-CM | POA: Diagnosis not present

## 2023-09-22 DIAGNOSIS — K5792 Diverticulitis of intestine, part unspecified, without perforation or abscess without bleeding: Secondary | ICD-10-CM | POA: Diagnosis not present

## 2023-09-22 DIAGNOSIS — N289 Disorder of kidney and ureter, unspecified: Secondary | ICD-10-CM | POA: Diagnosis not present

## 2023-09-22 DIAGNOSIS — K838 Other specified diseases of biliary tract: Secondary | ICD-10-CM | POA: Diagnosis not present

## 2023-09-22 DIAGNOSIS — N179 Acute kidney failure, unspecified: Secondary | ICD-10-CM | POA: Diagnosis not present

## 2023-10-26 DIAGNOSIS — N1832 Chronic kidney disease, stage 3b: Secondary | ICD-10-CM | POA: Diagnosis not present

## 2023-10-26 DIAGNOSIS — Z0000 Encounter for general adult medical examination without abnormal findings: Secondary | ICD-10-CM | POA: Diagnosis not present

## 2023-10-26 DIAGNOSIS — K831 Obstruction of bile duct: Secondary | ICD-10-CM | POA: Diagnosis not present

## 2023-10-26 DIAGNOSIS — D508 Other iron deficiency anemias: Secondary | ICD-10-CM | POA: Diagnosis not present

## 2023-10-26 DIAGNOSIS — N390 Urinary tract infection, site not specified: Secondary | ICD-10-CM | POA: Diagnosis not present

## 2023-10-26 DIAGNOSIS — I10 Essential (primary) hypertension: Secondary | ICD-10-CM | POA: Diagnosis not present

## 2023-10-26 DIAGNOSIS — R600 Localized edema: Secondary | ICD-10-CM | POA: Diagnosis not present

## 2023-10-26 DIAGNOSIS — E7800 Pure hypercholesterolemia, unspecified: Secondary | ICD-10-CM | POA: Diagnosis not present

## 2023-10-26 DIAGNOSIS — M170 Bilateral primary osteoarthritis of knee: Secondary | ICD-10-CM | POA: Diagnosis not present

## 2023-10-26 DIAGNOSIS — Z853 Personal history of malignant neoplasm of breast: Secondary | ICD-10-CM | POA: Diagnosis not present

## 2023-10-26 DIAGNOSIS — M109 Gout, unspecified: Secondary | ICD-10-CM | POA: Diagnosis not present

## 2023-10-31 DIAGNOSIS — L821 Other seborrheic keratosis: Secondary | ICD-10-CM | POA: Diagnosis not present

## 2023-10-31 DIAGNOSIS — L89819 Pressure ulcer of head, unspecified stage: Secondary | ICD-10-CM | POA: Diagnosis not present

## 2023-10-31 DIAGNOSIS — D485 Neoplasm of uncertain behavior of skin: Secondary | ICD-10-CM | POA: Diagnosis not present

## 2023-10-31 DIAGNOSIS — C44511 Basal cell carcinoma of skin of breast: Secondary | ICD-10-CM | POA: Diagnosis not present

## 2023-10-31 DIAGNOSIS — B351 Tinea unguium: Secondary | ICD-10-CM | POA: Diagnosis not present

## 2023-10-31 DIAGNOSIS — D225 Melanocytic nevi of trunk: Secondary | ICD-10-CM | POA: Diagnosis not present

## 2023-10-31 DIAGNOSIS — L814 Other melanin hyperpigmentation: Secondary | ICD-10-CM | POA: Diagnosis not present

## 2023-11-29 DIAGNOSIS — C44511 Basal cell carcinoma of skin of breast: Secondary | ICD-10-CM | POA: Diagnosis not present

## 2023-12-23 DIAGNOSIS — L259 Unspecified contact dermatitis, unspecified cause: Secondary | ICD-10-CM | POA: Diagnosis not present

## 2023-12-23 DIAGNOSIS — T1490XD Injury, unspecified, subsequent encounter: Secondary | ICD-10-CM | POA: Diagnosis not present

## 2024-02-20 DIAGNOSIS — N184 Chronic kidney disease, stage 4 (severe): Secondary | ICD-10-CM | POA: Diagnosis not present

## 2024-02-29 DIAGNOSIS — N1832 Chronic kidney disease, stage 3b: Secondary | ICD-10-CM | POA: Diagnosis not present

## 2024-02-29 DIAGNOSIS — M109 Gout, unspecified: Secondary | ICD-10-CM | POA: Diagnosis not present

## 2024-02-29 DIAGNOSIS — D631 Anemia in chronic kidney disease: Secondary | ICD-10-CM | POA: Diagnosis not present

## 2024-02-29 DIAGNOSIS — N390 Urinary tract infection, site not specified: Secondary | ICD-10-CM | POA: Diagnosis not present

## 2024-02-29 DIAGNOSIS — E559 Vitamin D deficiency, unspecified: Secondary | ICD-10-CM | POA: Diagnosis not present

## 2024-02-29 DIAGNOSIS — I129 Hypertensive chronic kidney disease with stage 1 through stage 4 chronic kidney disease, or unspecified chronic kidney disease: Secondary | ICD-10-CM | POA: Diagnosis not present

## 2024-02-29 DIAGNOSIS — R600 Localized edema: Secondary | ICD-10-CM | POA: Diagnosis not present

## 2024-03-25 ENCOUNTER — Encounter (HOSPITAL_COMMUNITY): Payer: Self-pay

## 2024-03-25 ENCOUNTER — Ambulatory Visit (HOSPITAL_COMMUNITY): Admission: EM | Admit: 2024-03-25 | Discharge: 2024-03-25 | Disposition: A | Discharge status: Home / Self Care

## 2024-03-25 DIAGNOSIS — S81812A Laceration without foreign body, left lower leg, initial encounter: Secondary | ICD-10-CM

## 2024-03-25 NOTE — ED Provider Notes (Signed)
 [facility] [place]  Note:  This document was prepared using Conservation officer, historic buildings and may include unintentional dictation errors.  MRN: [id number] DOB: [birth year]  Subjective:   Melissa Morton Morton is a 89 y.o. female presenting for left shin laceration that happened approximately 30 minutes prior to arrival in urgent care.  Patient reports that she was getting out of the car when she stepped back but not far enough and the edge of the door raked against the left shin causing a laceration/skin tear.  Patient reports immediate bleeding, bandage in place controlling bleeding at this time.  Patient's most recent Tdap was in 2023.  Patient denies any severe pain at this time.  No current facility-administered medications for this encounter.  Current Outpatient Medications:    alendronate (FOSAMAX) 70 MG tablet, Take 70 mg by mouth every Wednesday. Take with a full glass of water on an empty stomach., Disp: , Rfl:    allopurinol  (ZYLOPRIM ) 300 MG tablet, Take 150 mg by mouth daily., Disp: , Rfl:    amLODipine  (NORVASC ) 2.5 MG tablet, Take 2.5 mg by mouth in the morning., Disp: , Rfl:    aspirin  81 MG chewable tablet, Chew 81 mg by mouth See admin instructions. Chew 81 mg by mouth on Mondays and Thursdays, Disp: , Rfl:    Calcium  Carb-Cholecalciferol (CALCIUM  + D3 PO), Take 2 tablets by mouth daily with breakfast., Disp: , Rfl:    Camphor-Menthol -Methyl Sal (SALONPAS) 3.05-08-08 % PTCH, Place 1 patch onto the skin daily as needed (for pain- and remove as directed)., Disp: , Rfl:    Carboxymethylcellulose Sodium (DRY EYE RELIEF OP), Place 1-2 drops into both eyes 4 (four) times daily as needed (for dryness)., Disp: , Rfl:    colchicine  0.6 MG tablet, Take 1 tablet daily for 5 days and then as needed, Disp: 30 tablet, Rfl: 0   diclofenac  Sodium (VOLTAREN ) 1 % GEL, Apply 4 g topically 4 (four) times daily. This is Melissaapp hide this islyKrishnan how you to right .et kn. ago she said that  she is feeling much better.  eI saw, Disp: , Rfl:    esomeprazole (NEXIUM) 40 MG capsule, Take 40 mg by mouth daily before breakfast., Disp: , Rfl:    famotidine (PEPCID) 20 MG tablet, Take 20 mg by mouth at bedtime., Disp: , Rfl:    ferrous sulfate  325 (65 FE) MG EC tablet, Take 325 mg by mouth at bedtime., Disp: , Rfl:    fluticasone  (FLONASE ) 50 MCG/ACT nasal spray, Place 1-2 sprays into both nostrils at bedtime., Disp: , Rfl:    fluticasone -salmeterol (WIXELA INHUB) 100-50 MCG/ACT AEPB, Inhale 2 puffs into the lungs 2 (two) times daily., Disp: , Rfl:    furosemide (LASIX) 20 MG tablet, Take 20 mg by mouth in the morning., Disp: , Rfl:    gabapentin  (NEURONTIN ) 100 MG capsule, Take 1 capsule (100 mg total) by mouth 2 (two) times daily., Disp: , Rfl:    montelukast  (SINGULAIR ) 10 MG tablet, Take 10 mg by mouth at bedtime., Disp: , Rfl:    Multiple Vitamins-Minerals (MULTIVITAMIN GUMMIES WOMENS) CHEW, Chew 2 tablets by mouth in the morning., Disp: , Rfl:    NON FORMULARY, Take 2 capsules by mouth See admin instructions. D-Mannose & Cranberry Extract Advanced Formula, Fast-Acting Natural Urinary Tract Health Support capsules- Take 2 capsules by mouth at bedtime, Disp: , Rfl:    PROAIR  HFA 108 (90 BASE) MCG/ACT inhaler, Inhale 2 puffs into the lungs every 6 (six) hours as  needed for wheezing or shortness of breath., Disp: , Rfl:    promethazine  (PHENERGAN ) 12.5 MG tablet, Take 12.5 mg by mouth every 6 (six) hours as needed for nausea or vomiting., Disp: , Rfl:    rosuvastatin  (CRESTOR ) 10 MG tablet, Take 10 mg by mouth at bedtime., Disp: , Rfl:    traMADol  (ULTRAM ) 50 MG tablet, Take 50 mg by mouth every 6 (six) hours as needed for moderate pain or severe pain (pain)., Disp: , Rfl:    ZYRTEC ALLERGY 10 MG tablet, Take 10 mg by mouth at bedtime., Disp: , Rfl:    Allergies  Allergen Reactions   Ciprofloxacin  Other (See Comments)    Dizziness    Cyclosporine Other (See Comments)    Possible  dizziness   Dilaudid  [Hydromorphone ] Nausea And Vomiting   Nsaids Other (See Comments)    11/09/11-  Avoid due to kidney disease (and possibly liver function, too)  Can tolerate low dose Aspirin     Prednisone Nausea And Vomiting    Past Medical History:  Diagnosis Date   Allergy    seasonal allergies   Arthritis    knee and hands   Breast cancer, left breast (HCC) 12/07/2011   Breast cancer, left breast (HCC) 11/24/11   left breast 4 o'clock bx=invasive ductal ca,grade II,ER/PR=+   Diverticulitis    Full dentures    GERD (gastroesophageal reflux disease)    HOH (hard of hearing)    right ear    Hx of radiation therapy 03/14/12- 05/02/12   left breast 5040 gray in 28 fx, site of presentation lower outer quadrant boosted to 6440 gray   Hyperlipemia    Hypertension    Left knee DJD    PONV (postoperative nausea and vomiting)    has used scop patch-that helped   Reflux    Renal insufficiency 11/09/2011   Shortness of breath    Wears glasses      Past Surgical History:  Procedure Laterality Date   ABDOMINAL HYSTERECTOMY  89   total   BLADDER SUSPENSION     BREAST LUMPECTOMY  01/04/2012   left,invasive ductcl ca, grade II,lloq, lymphovascular and perineural invaion ,DCIS,   CHOLECYSTECTOMY     EYE SURGERY     both cataracts   FRACTURE SURGERY     lt foot   KNEE SURGERY  2000   rt    TONSILLECTOMY     TOTAL KNEE ARTHROPLASTY Left 12/24/2013   Procedure: LEFT TOTAL KNEE ARTHROPLASTY;  Surgeon: Lamar DELENA Millman, MD;  Location: [facility] OR;  Service: Orthopedics;  Laterality: Left;   TUBAL LIGATION      Family History  Problem Relation Age of Onset   Breast cancer Sister 64   Leukemia Sister    Heart disease Father    Breast cancer Maternal Aunt 76   Lung cancer Cousin        smoker    Social History   Tobacco Use   Smoking status: Former    Current packs/day: 0.00    Average packs/day: 0.3 packs/day for 20.0 years (5.0 ttl pk-yrs)    Types: Cigarettes    Start date:  11/09/1971    Quit date: 11/09/1991    Years since quitting: 32.3   Smokeless tobacco: Never  Vaping Use   Vaping status: Never Used  Substance Use Topics   Alcohol  use: Yes    Comment: Rare   Drug use: No    ROS Refer to HPI for ROS details.  Objective:  Vitals: BP 119/74 (BP Location: Right Arm)   Pulse 75   Temp 98.2 F (36.8 C) (Oral)   Resp 16   SpO2 99%   Physical Exam Vitals and nursing note reviewed.  Constitutional:      General: She is not in acute distress.    Appearance: Normal appearance. She is not ill-appearing.  HENT:     Head: Normocephalic.  Cardiovascular:     Rate and Rhythm: Normal rate.  Pulmonary:     Effort: Pulmonary effort is normal. No respiratory distress.  Skin:    General: Skin is warm and dry.     Capillary Refill: Capillary refill takes less than 2 seconds.     Findings: Laceration and wound present.      Neurological:     General: No focal deficit present.     Mental Status: She is alert and oriented to person, place, and time.  Psychiatric:        Mood and Affect: Mood normal.        Behavior: Behavior normal.     Procedures  No results found for this or any previous visit (from the past 24 hours).  Assessment and Plan :     Discharge Instructions       1. Laceration of left lower extremity, initial encounter (Primary) - Keep pressure bandage in place for 24 hours after which you may remove and reapply antibiotic ointment and fresh bandage. -Do not scrub the suture site as this could cause sutures to dislodge and reopen wound. - Do not try to remove Steri-Strips as they will fall off as new skin grows underneath them when they are ready to come off. -Keep Saran wrap or other wound covering in place when showering to prevent direct water pressure from hitting wound as this could cause bleeding and Steri-Strips to loosen. -Lightly moisten nonadherent bandage prior to removing from skin above wound to prevent bandage  from sticking to the wound and causing excessive bleeding. -Continue to monitor for signs of infection such as increased pain, purulent drainage, increased redness, increased swelling, increased pain.  If you experience any escalation of symptoms follow-up for further evaluation management      Breyona Swander B Danyel Tobey   Boruch Manuele B, NP 03/25/24 1931

## 2024-03-25 NOTE — ED Triage Notes (Signed)
 Patient here today with c/o left shin laceration about 30 minutes ago. Patient got out of the car and she went to shut the car door and didn't back up enough and the door hit her in the leg. Last Tdap 2023

## 2024-03-25 NOTE — Discharge Instructions (Addendum)
  1. Laceration of left lower extremity, initial encounter (Primary) - Keep pressure bandage in place for 24 hours after which you may remove and reapply antibiotic ointment and fresh bandage. -Do not scrub the suture site as this could cause sutures to dislodge and reopen wound. - Do not try to remove Steri-Strips as they will fall off as new skin grows underneath them when they are ready to come off. -Keep Saran wrap or other wound covering in place when showering to prevent direct water pressure from hitting wound as this could cause bleeding and Steri-Strips to loosen. -Lightly moisten nonadherent bandage prior to removing from skin above wound to prevent bandage from sticking to the wound and causing excessive bleeding. -Continue to monitor for signs of infection such as increased pain, purulent drainage, increased redness, increased swelling, increased pain.  If you experience any escalation of symptoms follow-up for further evaluation management
